# Patient Record
Sex: Female | Born: 1960
Health system: Southern US, Community
[De-identification: ages and names within clinical notes are randomized; demographics above are authoritative.]

## PROBLEM LIST (undated history)

## (undated) DIAGNOSIS — F32A Depression, unspecified: Secondary | ICD-10-CM

## (undated) DIAGNOSIS — I214 Non-ST elevation (NSTEMI) myocardial infarction: Secondary | ICD-10-CM

## (undated) DIAGNOSIS — I2510 Atherosclerotic heart disease of native coronary artery without angina pectoris: Secondary | ICD-10-CM

## (undated) DIAGNOSIS — E785 Hyperlipidemia, unspecified: Secondary | ICD-10-CM

## (undated) DIAGNOSIS — I10 Essential (primary) hypertension: Secondary | ICD-10-CM

## (undated) DIAGNOSIS — G43909 Migraine, unspecified, not intractable, without status migrainosus: Secondary | ICD-10-CM

## (undated) DIAGNOSIS — F329 Major depressive disorder, single episode, unspecified: Secondary | ICD-10-CM

## (undated) DIAGNOSIS — Z87442 Personal history of urinary calculi: Secondary | ICD-10-CM

## (undated) HISTORY — PX: SKIN SURGERY: SHX2413

## (undated) HISTORY — PX: LITHOTRIPSY: SUR834

## (undated) HISTORY — DX: Hyperlipidemia, unspecified: E78.5

## (undated) HISTORY — PX: CYSTOSCOPY W/ STONE MANIPULATION: SHX1427

---
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
## 1998-01-25 ENCOUNTER — Emergency Department (HOSPITAL_COMMUNITY): Admission: EM | Admit: 1998-01-25 | Discharge: 1998-01-25 | Payer: Self-pay | Admitting: Emergency Medicine

## 1998-08-23 ENCOUNTER — Emergency Department (HOSPITAL_COMMUNITY): Admission: EM | Admit: 1998-08-23 | Discharge: 1998-08-23 | Payer: Self-pay | Admitting: Emergency Medicine

## 1998-10-16 ENCOUNTER — Ambulatory Visit (HOSPITAL_COMMUNITY): Admission: RE | Admit: 1998-10-16 | Discharge: 1998-10-16 | Payer: Self-pay | Admitting: Gastroenterology

## 2000-04-01 ENCOUNTER — Ambulatory Visit (HOSPITAL_COMMUNITY): Admission: RE | Admit: 2000-04-01 | Discharge: 2000-04-01 | Payer: Self-pay | Admitting: Internal Medicine

## 2000-04-01 ENCOUNTER — Encounter: Payer: Self-pay | Admitting: Internal Medicine

## 2000-06-24 ENCOUNTER — Ambulatory Visit (HOSPITAL_COMMUNITY): Admission: RE | Admit: 2000-06-24 | Discharge: 2000-06-24 | Payer: Self-pay | Admitting: Gastroenterology

## 2000-06-24 ENCOUNTER — Encounter (INDEPENDENT_AMBULATORY_CARE_PROVIDER_SITE_OTHER): Payer: Self-pay | Admitting: Specialist

## 2000-06-30 ENCOUNTER — Ambulatory Visit (HOSPITAL_COMMUNITY): Admission: RE | Admit: 2000-06-30 | Discharge: 2000-06-30 | Payer: Self-pay | Admitting: Gastroenterology

## 2000-06-30 ENCOUNTER — Encounter: Payer: Self-pay | Admitting: Gastroenterology

## 2001-04-03 ENCOUNTER — Encounter: Admission: RE | Admit: 2001-04-03 | Discharge: 2001-04-03 | Payer: Self-pay | Admitting: Internal Medicine

## 2001-04-03 ENCOUNTER — Encounter: Payer: Self-pay | Admitting: Internal Medicine

## 2001-04-20 ENCOUNTER — Encounter: Admission: RE | Admit: 2001-04-20 | Discharge: 2001-04-20 | Payer: Self-pay | Admitting: Internal Medicine

## 2001-04-20 ENCOUNTER — Encounter: Payer: Self-pay | Admitting: Internal Medicine

## 2001-05-03 ENCOUNTER — Encounter (INDEPENDENT_AMBULATORY_CARE_PROVIDER_SITE_OTHER): Payer: Self-pay | Admitting: *Deleted

## 2001-05-03 ENCOUNTER — Ambulatory Visit (HOSPITAL_COMMUNITY): Admission: RE | Admit: 2001-05-03 | Discharge: 2001-05-03 | Payer: Self-pay | Admitting: Internal Medicine

## 2001-05-03 ENCOUNTER — Encounter: Payer: Self-pay | Admitting: Internal Medicine

## 2001-08-14 ENCOUNTER — Encounter: Payer: Self-pay | Admitting: Emergency Medicine

## 2001-08-14 ENCOUNTER — Emergency Department (HOSPITAL_COMMUNITY): Admission: EM | Admit: 2001-08-14 | Discharge: 2001-08-14 | Payer: Self-pay | Admitting: Emergency Medicine

## 2001-11-16 ENCOUNTER — Encounter: Admission: RE | Admit: 2001-11-16 | Discharge: 2001-11-16 | Payer: Self-pay | Admitting: Internal Medicine

## 2001-11-16 ENCOUNTER — Encounter: Payer: Self-pay | Admitting: Internal Medicine

## 2002-07-26 ENCOUNTER — Ambulatory Visit (HOSPITAL_COMMUNITY): Admission: RE | Admit: 2002-07-26 | Discharge: 2002-07-26 | Payer: Self-pay | Admitting: Internal Medicine

## 2002-07-26 ENCOUNTER — Encounter: Payer: Self-pay | Admitting: Internal Medicine

## 2002-08-03 ENCOUNTER — Encounter: Payer: Self-pay | Admitting: Internal Medicine

## 2002-08-03 ENCOUNTER — Encounter: Admission: RE | Admit: 2002-08-03 | Discharge: 2002-08-03 | Payer: Self-pay | Admitting: Internal Medicine

## 2003-08-07 ENCOUNTER — Encounter: Admission: RE | Admit: 2003-08-07 | Discharge: 2003-08-07 | Payer: Self-pay | Admitting: Internal Medicine

## 2003-09-20 ENCOUNTER — Encounter: Admission: RE | Admit: 2003-09-20 | Discharge: 2003-09-20 | Payer: Self-pay | Admitting: Internal Medicine

## 2003-10-22 ENCOUNTER — Other Ambulatory Visit: Admission: RE | Admit: 2003-10-22 | Discharge: 2003-10-22 | Payer: Self-pay | Admitting: Internal Medicine

## 2003-10-23 ENCOUNTER — Encounter: Admission: RE | Admit: 2003-10-23 | Discharge: 2003-10-23 | Payer: Self-pay | Admitting: Internal Medicine

## 2003-11-05 ENCOUNTER — Ambulatory Visit (HOSPITAL_COMMUNITY): Admission: RE | Admit: 2003-11-05 | Discharge: 2003-11-05 | Payer: Self-pay | Admitting: Internal Medicine

## 2003-11-05 ENCOUNTER — Encounter (INDEPENDENT_AMBULATORY_CARE_PROVIDER_SITE_OTHER): Payer: Self-pay | Admitting: Specialist

## 2004-03-27 ENCOUNTER — Encounter: Admission: RE | Admit: 2004-03-27 | Discharge: 2004-03-27 | Payer: Self-pay | Admitting: Family Medicine

## 2004-09-21 ENCOUNTER — Encounter: Admission: RE | Admit: 2004-09-21 | Discharge: 2004-09-21 | Payer: Self-pay | Admitting: Internal Medicine

## 2004-11-18 ENCOUNTER — Other Ambulatory Visit: Admission: RE | Admit: 2004-11-18 | Discharge: 2004-11-18 | Payer: Self-pay | Admitting: Internal Medicine

## 2005-05-18 ENCOUNTER — Emergency Department (HOSPITAL_COMMUNITY): Admission: EM | Admit: 2005-05-18 | Discharge: 2005-05-18 | Payer: Self-pay | Admitting: Emergency Medicine

## 2005-09-22 ENCOUNTER — Encounter: Admission: RE | Admit: 2005-09-22 | Discharge: 2005-09-22 | Payer: Self-pay | Admitting: Internal Medicine

## 2006-08-01 ENCOUNTER — Inpatient Hospital Stay (HOSPITAL_COMMUNITY): Admission: EM | Admit: 2006-08-01 | Discharge: 2006-08-02 | Payer: Self-pay | Admitting: Emergency Medicine

## 2006-09-30 ENCOUNTER — Encounter: Admission: RE | Admit: 2006-09-30 | Discharge: 2006-09-30 | Payer: Self-pay | Admitting: *Deleted

## 2007-09-18 ENCOUNTER — Encounter: Admission: RE | Admit: 2007-09-18 | Discharge: 2007-09-18 | Payer: Self-pay | Admitting: Family Medicine

## 2007-09-29 ENCOUNTER — Encounter: Admission: RE | Admit: 2007-09-29 | Discharge: 2007-09-29 | Payer: Self-pay | Admitting: Family Medicine

## 2007-09-29 ENCOUNTER — Encounter (INDEPENDENT_AMBULATORY_CARE_PROVIDER_SITE_OTHER): Payer: Self-pay | Admitting: Interventional Radiology

## 2007-09-29 ENCOUNTER — Other Ambulatory Visit: Admission: RE | Admit: 2007-09-29 | Discharge: 2007-09-29 | Payer: Self-pay | Admitting: Interventional Radiology

## 2007-10-19 ENCOUNTER — Encounter: Admission: RE | Admit: 2007-10-19 | Discharge: 2007-10-19 | Payer: Self-pay | Admitting: Family Medicine

## 2007-11-02 ENCOUNTER — Encounter: Admission: RE | Admit: 2007-11-02 | Discharge: 2007-11-02 | Payer: Self-pay | Admitting: Family Medicine

## 2008-01-08 ENCOUNTER — Other Ambulatory Visit: Admission: RE | Admit: 2008-01-08 | Discharge: 2008-01-08 | Payer: Self-pay | Admitting: Pediatrics

## 2009-12-17 ENCOUNTER — Encounter: Admission: RE | Admit: 2009-12-17 | Discharge: 2009-12-17 | Payer: Self-pay | Admitting: *Deleted

## 2009-12-22 ENCOUNTER — Encounter: Admission: RE | Admit: 2009-12-22 | Discharge: 2009-12-22 | Payer: Self-pay | Admitting: *Deleted

## 2010-01-16 ENCOUNTER — Ambulatory Visit (HOSPITAL_COMMUNITY): Admission: RE | Admit: 2010-01-16 | Discharge: 2010-01-16 | Payer: Self-pay | Admitting: Surgery

## 2010-06-21 ENCOUNTER — Encounter: Payer: Self-pay | Admitting: Family Medicine

## 2010-08-13 LAB — DIFFERENTIAL
Basophils Relative: 2 % — ABNORMAL HIGH (ref 0–1)
Eosinophils Absolute: 0.1 10*3/uL (ref 0.0–0.7)
Eosinophils Relative: 2 % (ref 0–5)
Lymphs Abs: 1.1 10*3/uL (ref 0.7–4.0)
Monocytes Absolute: 0.4 10*3/uL (ref 0.1–1.0)
Monocytes Relative: 10 % (ref 3–12)
Neutrophils Relative %: 60 % (ref 43–77)

## 2010-08-13 LAB — CBC
Hemoglobin: 14.8 g/dL (ref 12.0–15.0)
MCH: 33.3 pg (ref 26.0–34.0)
MCHC: 34.4 g/dL (ref 30.0–36.0)

## 2010-08-13 LAB — BASIC METABOLIC PANEL
Calcium: 9.7 mg/dL (ref 8.4–10.5)
Chloride: 102 mEq/L (ref 96–112)
Creatinine, Ser: 0.62 mg/dL (ref 0.4–1.2)
GFR calc Af Amer: >60 mL/min (ref >60)
Potassium: 4.1 mEq/L (ref 3.5–5.1)

## 2010-10-16 NOTE — H&P (Signed)
NAME:  Monique Hunt, Monique Hunt             ACCOUNT NO.:  [id number]   MEDICAL RECORD NO.:  [id number]          PATIENT TYPE:  INP   LOCATION:  6713                         FACILITY:  [facility]   PHYSICIAN:  Kela Millin, M.D.DATE OF BIRTH:  1960-07-07   DATE OF ADMISSION:  08/01/2006  DATE OF DISCHARGE:                              HISTORY & PHYSICAL   CHIEF COMPLAINT:  Chest pain.   HISTORY OF PRESENT ILLNESS:  The patient is a 50 year old white female  with a past medical history significant for GERD, hypothyroidism who  presents with complaints of chest pain x2 days.  She reports that she  was awakened by the pain on Sunday morning, and she describes the pain  as constant, 9 out of 10 in intensity, associated with nausea but no  vomiting, also associated with shortness of breath and a pressure  sensation.  She did state initially it was located in her mid sternal  area and also her left precordium but the left precordial component  resolved Sunday and the mid sternal pain persisted.  She denies any  radiation, diaphoresis, not associated with meals or with exertion.  Patient denies cough, fevers, abdominal pain, dysuria, melena and no  hematochezia.  No reports of any trauma or lifting any heavy weights.  It is noted that patient had presented in 2006 with similar complaints  and was seen in the ER, discharged home and upon followup had an EGD  done and was diagnosed with GERD at that time and placed on medication  for reflux, which she does not remember the name, as well as maintained  on the Nexium.   Patient was seen in the ER and point of care markers were negative.  She  had a chest x-ray done which was negative for acute infiltrates, EKG  normal sinus rhythm with no ischemic changes noted.  She is admitted to  the [facility] for further evaluation and management.   PAST MEDICAL HISTORY:  As stated above.   MEDICATIONS:  1. Nexium one p.o. daily.  2. Synthroid  0.25 mg daily.  3. Neurontin 200 mg p.o. t.i.d.  4. A pill for reflux - does not remember name.   ALLERGIES:  NKDA.   SOCIAL HISTORY:  She denies tobacco, occasional alcohol.   FAMILY HISTORY:  Her father had an MI/coronary artery disease and had  bypass done at age 2.  Her Mom diabetic.   REVIEW OF SYSTEMS:  As per HPI, other review of systems negative.   PHYSICAL EXAM:  GENERAL:  The patient is a pleasant middle-aged white  female, she is alert and oriented, in no apparent distress.  VITAL SIGNS:  Her temperature is 98.6 with a blood pressure of 110/60,  pulse of 84, respiratory rate of 20, O2 sat of 100%.  HEENT:  PERRL, EOMI, sclera anicteric, moist mucous membranes and no  oral exudates.  NECK:  Supple, no adenopathy, no thyromegaly.  LUNGS/CHEST:  She has chest wall tenderness on exam, lungs are clear to  auscultation bilaterally, no crackles or wheezes.  CARDIOVASCULAR:  Regular rate and rhythm, normal S1, S2.  No murmurs  appreciated, no gallops.  ABDOMEN:  Soft, bowel sounds present, nontender, nondistended, no  organomegaly and no masses palpable.  EXTREMITIES:  No cyanosis and no edema.  NEURO:  She is alert, oriented, cranial nerves II-XII grossly intact,  nonfocal exam.   LABORATORY DATA:  The chest x-ray shows borderline heart size, otherwise  negative for acute infiltrates.  Her point of care markers are negative  and sodium is 138, potassium of 3.4, chloride 108, BUN is 14, glucose is  98, pH is 7.42, pCO2 of 34.1 and bicarbonate is 22.1.  Her hemoglobin is  13.9 with a hematocrit of 41, creatinine is 0.8.  Her point of care  markers are negative x2.   ASSESSMENT AND PLAN:  1. Chest pain - cardiac versus musculoskeletal versus gastroesophageal      reflux disease.  As noted above, patient with chest wall tenderness      on exam.  Will obtain serial cardiac enzymes to rule out myocardial      infarction.  Continue proton pump inhibitor.  Add Toradol as       needed.  Place patient on aspirin and nitrates and follow.  Will      follow cardiac enzymes and consider inpatient versus outpatient      stress test pending enzymes per Cardiology.  2. Gastroesophageal reflux disease - continue proton pump inhibitor,      as above.  3. Hypothyroidism - recheck TSH, follow and resume Synthroid.      Kela Millin, M.D.  Electronically Signed     ACV/MEDQ  D:  08/02/2006  T:  08/02/2006  Job:  [id number]   cc:   Lucita Ferrara, MD  Corky Crafts, MD

## 2010-10-16 NOTE — Consult Note (Signed)
NAME:  Monique Hunt, Monique Hunt NO.:  [id number]   MEDICAL RECORD NO.:  [id number]          PATIENT TYPE:  EMS   LOCATION:  MAJO                         FACILITY:  [facility]   PHYSICIAN:  Thora Lance, M.D.  DATE OF BIRTH:  07/01/1960   DATE OF CONSULTATION:  05/18/2005  DATE OF DISCHARGE:  05/18/2005                                   CONSULTATION   CHIEF COMPLAINT:  Chest pain.   HISTORY OF PRESENT ILLNESS:  This is a 50 year old female with a history of  hypothyroidism and GERD, who presents with chest pain.  She woke up  yesterday morning with pain in her lower central chest and upper  epigastrium.  The pain was severe at times.  It has been constant over the  last 36 hours.  It is described as a burning or sharp-type pain.  There have  been no obvious exacerbating or relieving factors such as eating, drinking  or motion.  She has had no true dyspnea, nausea, vomiting or diaphoresis.  The patient went through the day yesterday with the pain.  When she woke up  and the pain was still present, she decided to come to the emergency room.  The patient had an EGD two weeks ago by Dr. Sherin Quarry, which apparently was  okay.  She has a history of gastroesophageal reflux and is currently on  Nexium, taking it regularly.  Her symptoms of reflux have included heartburn  and eructation during the past but not this type of chest pain.  In the ER  she received Dilaudid and within one hour her pain had resolved and has not  recurred.   PAST MEDICAL HISTORY:  1.  Hypothyroidism, on Synthroid.  2.  GERD, on Nexium.   PAST SURGICAL HISTORY:  Kidney stone surgery.   ALLERGIES:  No known drug allergies.   CURRENT MEDICATIONS:  1.  Synthroid, question dose.  2.  Nexium 40 mg a day.   FAMILY HISTORY:  Her father had CABG, early 33s.  Mother, diabetes mellitus.  Brother, elevated cholesterol.   SOCIAL HISTORY:  Married.  Two children.  She works at a Artist.  Tobacco use:   None.  Alcohol:  Very little.   REVIEW OF SYSTEMS:  Cholesterol borderline elevated in the past.   PHYSICAL EXAMINATION:  GENERAL:  A well-appearing white female.  VITAL SIGNS:  BP initially 185/83, heart rate 74, respirations 20,  temperature 97.4.  Blood pressure dropped to 139/80 with improvement in  pain.  NECK:  Supple, no carotid bruits.  LUNGS:  Clear.  CARDIAC:  Regular rate and rhythm without murmur, gallop, or rub.  CHEST WALL:  She has tenderness over the lower costochondral junctions  bilaterally.  ABDOMEN:  Soft, nontender, no mass or hepatosplenomegaly.  EXTREMITIES:  No edema.   LABORATORY DATA:  CBC:  WBC 4.8, hemoglobin 13.1, platelet count 381.  Chemistry:  Sodium 140, potassium 4.1, chloride 104, bicarbonate 28, glucose  100, BUN 10, creatinine 0.6, calcium 9.2.  Total protein 6.5, albumin 3.9,  SGOT 27, SGPT 28, alkaline phosphatase 52, total bilirubin 0.5.  Lipase 35.  Cardiac markers are all negative.  Fibrin derivatives 0.30 (less than 0.48  normal).  A chest x-ray shows borderline cardiomegaly, no acute disease.  EKG:  Normal sinus rhythm, there are nonspecific T-wave changes.  Right  upper quadrant ultrasound is negative for gallstones.   ASSESSMENT:  Chest pain.  Lasted for approximately 36 hours before resolving  after narcotics given.  The most likely causes are either gastroesophageal  reflux or costochondritis.  Doubt this is cardiac given her young age, lack  of cardiac risk factors, lack of EKG findings and normal cardiac markers.   PLAN:  1.  Increase Nexium to 40 mg b.i.d. temporarily.  2.  Follow up with Marcene Duos, M.D., her primary physician, in      the next three days for consideration of further workup.           ______________________________  Thora Lance, M.D.     JJG/MEDQ  D:  05/18/2005  T:  05/20/2005  Job:  [id number]   cc:   Marcene Duos, M.D.  Fax: [phone]

## 2010-11-13 ENCOUNTER — Other Ambulatory Visit: Payer: Self-pay | Admitting: Family Medicine

## 2010-11-13 DIAGNOSIS — M2550 Pain in unspecified joint: Secondary | ICD-10-CM

## 2010-12-01 ENCOUNTER — Ambulatory Visit
Admission: RE | Admit: 2010-12-01 | Discharge: 2010-12-01 | Disposition: A | Discharge status: Home / Self Care | Payer: PRIVATE HEALTH INSURANCE | Source: Ambulatory Visit | Attending: Family Medicine | Admitting: Family Medicine

## 2010-12-01 DIAGNOSIS — M2550 Pain in unspecified joint: Secondary | ICD-10-CM

## 2010-12-27 ENCOUNTER — Emergency Department (HOSPITAL_COMMUNITY)
Admission: EM | Admit: 2010-12-27 | Discharge: 2010-12-27 | Disposition: A | Discharge status: Home / Self Care | Payer: PRIVATE HEALTH INSURANCE | Attending: Emergency Medicine | Admitting: Emergency Medicine

## 2010-12-27 DIAGNOSIS — N200 Calculus of kidney: Secondary | ICD-10-CM | POA: Insufficient documentation

## 2010-12-27 DIAGNOSIS — R109 Unspecified abdominal pain: Secondary | ICD-10-CM | POA: Insufficient documentation

## 2010-12-27 LAB — URINALYSIS, ROUTINE W REFLEX MICROSCOPIC
Bilirubin Urine: NEGATIVE
Glucose, UA: NEGATIVE mg/dL
Nitrite: NEGATIVE
Protein, ur: 100 mg/dL — AB
Urobilinogen, UA: 1 mg/dL (ref 0.0–1.0)
pH: 6 (ref 5.0–8.0)

## 2010-12-27 LAB — URINE MICROSCOPIC-ADD ON

## 2010-12-27 LAB — POCT PREGNANCY, URINE: Preg Test, Ur: NEGATIVE

## 2011-03-22 ENCOUNTER — Other Ambulatory Visit: Payer: Self-pay | Admitting: Family Medicine

## 2011-03-22 DIAGNOSIS — Z1231 Encounter for screening mammogram for malignant neoplasm of breast: Secondary | ICD-10-CM

## 2011-03-22 DIAGNOSIS — N6452 Nipple discharge: Secondary | ICD-10-CM

## 2011-04-01 ENCOUNTER — Other Ambulatory Visit: Payer: Self-pay | Admitting: Nurse Practitioner

## 2011-04-01 ENCOUNTER — Other Ambulatory Visit (HOSPITAL_COMMUNITY)
Admission: RE | Admit: 2011-04-01 | Discharge: 2011-04-01 | Disposition: A | Discharge status: Home / Self Care | Payer: PRIVATE HEALTH INSURANCE | Source: Ambulatory Visit | Attending: Obstetrics and Gynecology | Admitting: Obstetrics and Gynecology

## 2011-04-01 DIAGNOSIS — Z1159 Encounter for screening for other viral diseases: Secondary | ICD-10-CM | POA: Insufficient documentation

## 2011-04-01 DIAGNOSIS — Z113 Encounter for screening for infections with a predominantly sexual mode of transmission: Secondary | ICD-10-CM | POA: Insufficient documentation

## 2011-04-01 DIAGNOSIS — Z01419 Encounter for gynecological examination (general) (routine) without abnormal findings: Secondary | ICD-10-CM | POA: Insufficient documentation

## 2011-04-07 ENCOUNTER — Encounter: Payer: Self-pay | Admitting: *Deleted

## 2011-04-07 ENCOUNTER — Emergency Department (INDEPENDENT_AMBULATORY_CARE_PROVIDER_SITE_OTHER): Payer: PRIVATE HEALTH INSURANCE

## 2011-04-07 ENCOUNTER — Other Ambulatory Visit: Payer: Self-pay

## 2011-04-07 ENCOUNTER — Emergency Department (HOSPITAL_BASED_OUTPATIENT_CLINIC_OR_DEPARTMENT_OTHER)
Admission: EM | Admit: 2011-04-07 | Discharge: 2011-04-07 | Disposition: A | Discharge status: Home / Self Care | Payer: PRIVATE HEALTH INSURANCE | Attending: Emergency Medicine | Admitting: Emergency Medicine

## 2011-04-07 DIAGNOSIS — R002 Palpitations: Secondary | ICD-10-CM

## 2011-04-07 DIAGNOSIS — R0602 Shortness of breath: Secondary | ICD-10-CM

## 2011-04-07 LAB — DIFFERENTIAL
Basophils Relative: 1 % (ref 0–1)
Eosinophils Absolute: 0 10*3/uL (ref 0.0–0.7)
Eosinophils Relative: 0 % (ref 0–5)
Lymphs Abs: 0.9 10*3/uL (ref 0.7–4.0)
Monocytes Relative: 6 % (ref 3–12)

## 2011-04-07 LAB — BASIC METABOLIC PANEL
BUN: 16 mg/dL (ref 6–23)
Calcium: 9.9 mg/dL (ref 8.4–10.5)
GFR calc non Af Amer: >90 mL/min (ref >90)
Glucose, Bld: 106 mg/dL — ABNORMAL HIGH (ref 70–99)

## 2011-04-07 LAB — CBC
Hemoglobin: 14.3 g/dL (ref 12.0–15.0)
MCH: 31.6 pg (ref 26.0–34.0)
MCHC: 34.4 g/dL (ref 30.0–36.0)
MCV: 91.8 fL (ref 78.0–100.0)
Platelets: 350 10*3/uL (ref 150–400)
RBC: 4.53 MIL/uL (ref 3.87–5.11)

## 2011-04-07 MED ORDER — POTASSIUM CHLORIDE CRYS ER 20 MEQ PO TBCR
EXTENDED_RELEASE_TABLET | ORAL | Status: AC
  Administered 2011-04-07: 20 meq
  Filled 2011-04-07: qty 1

## 2011-04-07 MED ORDER — POTASSIUM CHLORIDE 20 MEQ PO PACK
20.0000 meq | PACK | Freq: Two times a day (BID) | ORAL | Status: DC
  Administered 2011-04-07: 20 meq via ORAL
  Filled 2011-04-07: qty 1

## 2011-04-07 MED ORDER — LORAZEPAM 2 MG/ML IJ SOLN
1.0000 mg | Freq: Once | INTRAMUSCULAR | Status: DC
  Filled 2011-04-07: qty 1

## 2011-04-07 NOTE — ED Notes (Signed)
Patient states she was driving and had a sudden onset of shortness of breath, feeling of being hot and a rapid heart rate.  No history of same.

## 2011-04-07 NOTE — ED Notes (Signed)
Patient stated she has been SOB since this AM, BBS clear. SAT 100% on RA. EKG done. RT will continue to monitor.

## 2011-04-07 NOTE — ED Provider Notes (Signed)
History     CSN: [id number] Arrival date & time: 04/07/2011  9:32 AM   First MD Initiated Contact with Patient 04/07/11 [phone]      Chief Complaint  Patient presents with  . Shortness of Breath    rapid heart rate    (Consider location/radiation/quality/duration/timing/severity/associated sxs/prior treatment) HPI The patient states she was driving to work when she suddenly felt short of breath, hot, and felt her heart racing. Patient denies any pain.  The symptoms started shortly prior to arrival today. They have subsequently almost resolved. Patient states she feels much better now. The patient denies any anxiety or chest pain. She denies any leg swelling. She has not had any recent trips or travel. Patient has not noticed any swelling in her legs. She is taking a new medication as listed in her home medications. The severity was severe. Nothing seemed to make it better or worse specifically it just resolved on its own. Past Medical History  Diagnosis Date  . Kidney stone     History reviewed. No pertinent past surgical history.  No family history on file.  History  Substance Use Topics  . Smoking status: Former Games developer  . Smokeless tobacco: Not on file  . Alcohol Use: Yes     occassionally    OB History    Grav Para Term Preterm Abortions TAB SAB Ect Mult Living                  Review of Systems  All other systems reviewed and are negative.    Allergies  Review of patient's allergies indicates not on file.  Home Medications   Current Outpatient Rx  Name Route Sig Dispense Refill  . NORETHIN ACE-ETH ESTRAD-FE 1-20 MG-MCG PO TABS Oral Take 1 tablet by mouth daily.        BP 177/99  Pulse 94  Temp(Src) 97.6 F (36.4 C) (Oral)  Ht 5\' 5"  (1.651 m)  Wt 135 lb (61.236 kg)  BMI 22.47 kg/m2  SpO2 100%  LMP 02/28/2011  Physical Exam  Nursing note and vitals reviewed. Constitutional: She appears well-developed and well-nourished. No distress.       Mildly  anxious  HENT:  Head: Normocephalic and atraumatic.  Right Ear: External ear normal.  Left Ear: External ear normal.  Eyes: Conjunctivae are normal. Right eye exhibits no discharge. Left eye exhibits no discharge. No scleral icterus.  Neck: Neck supple. No JVD present. No tracheal deviation present. No thyromegaly present.  Cardiovascular: Normal rate, regular rhythm and intact distal pulses.   Pulmonary/Chest: Effort normal and breath sounds normal. No stridor. No respiratory distress. She has no wheezes. She has no rales.       Hyperventilating slightly  Abdominal: Soft. Bowel sounds are normal. She exhibits no distension. There is no tenderness. There is no rebound and no guarding.  Musculoskeletal: She exhibits no edema and no tenderness.  Lymphadenopathy:    She has no cervical adenopathy.  Neurological: She is alert. She has normal strength. No sensory deficit. Cranial nerve deficit:  no gross defecits noted. She exhibits normal muscle tone. She displays no seizure activity. Coordination normal.  Skin: Skin is warm and dry. No rash noted.  Psychiatric: She has a normal mood and affect.    ED Course  Procedures (including critical care time)  Date: 04/07/2011  Rate: 87  Rhythm: normal sinus rhythm  QRS Axis: normal  Intervals: normal  ST/T Wave abnormalities: LVH  Conduction Disutrbances:none  Narrative Interpretation:  Old EKG Reviewed: none available   Labs Reviewed  DIFFERENTIAL - Abnormal; Notable for the following:    Neutrophils Relative 82 (*)    Lymphocytes Relative 11 (*)    All other components within normal limits  BASIC METABOLIC PANEL - Abnormal; Notable for the following:    Potassium 3.2 (*)    Glucose, Bld 106 (*)    All other components within normal limits  CBC  PRO B NATRIURETIC PEPTIDE  TROPONIN I  D-DIMER, QUANTITATIVE   Dg Chest 2 View  04/07/2011  *RADIOLOGY REPORT*  Clinical Data: Shortness of breath.  CHEST - 2 VIEW  Comparison: 08/01/2006   Findings: Heart and mediastinal contours are within normal limits. No focal opacities or effusions.  No acute bony abnormality. Thoracolumbar scoliosis with degenerative changes.  IMPRESSION: No active cardiopulmonary disease.  Original Report Authenticated By: Cyndie Chime, M.D.        MDM  The patient had acute abnormalities noted on her lab and x-ray testing. Her symptoms have resolved at this point. There is no symptoms to suggest pulmonary embolus , pneumothorax, pneumonia. I doubt acute coronary syndrome. Patient could have had a panic attack or as well she might have an episode of a tachycardia dysrhythmia (less likely) the patient followup with her primary care doctor. Consider outpatient Holter monitoring.  Diagnosis: Palpitations      Celene Kras, MD 04/07/11 1301

## 2011-04-07 NOTE — ED Notes (Signed)
Pt refused meds.

## 2011-04-08 ENCOUNTER — Ambulatory Visit: Payer: PRIVATE HEALTH INSURANCE

## 2011-04-09 ENCOUNTER — Other Ambulatory Visit: Payer: Self-pay | Admitting: Family Medicine

## 2011-04-09 ENCOUNTER — Ambulatory Visit
Admission: RE | Admit: 2011-04-09 | Discharge: 2011-04-09 | Disposition: A | Discharge status: Home / Self Care | Payer: PRIVATE HEALTH INSURANCE | Source: Ambulatory Visit | Attending: Family Medicine | Admitting: Family Medicine

## 2011-04-09 DIAGNOSIS — N6452 Nipple discharge: Secondary | ICD-10-CM

## 2011-09-26 ENCOUNTER — Encounter (HOSPITAL_COMMUNITY): Payer: Self-pay | Admitting: Emergency Medicine

## 2011-09-26 ENCOUNTER — Inpatient Hospital Stay (HOSPITAL_COMMUNITY)
Admission: EM | Admit: 2011-09-26 | Discharge: 2011-09-29 | DRG: 690 | Disposition: A | Discharge status: Home / Self Care | Payer: PRIVATE HEALTH INSURANCE | Attending: Internal Medicine | Admitting: Internal Medicine

## 2011-09-26 DIAGNOSIS — N10 Acute tubulo-interstitial nephritis: Principal | ICD-10-CM | POA: Diagnosis present

## 2011-09-26 DIAGNOSIS — N200 Calculus of kidney: Secondary | ICD-10-CM | POA: Diagnosis present

## 2011-09-26 DIAGNOSIS — E876 Hypokalemia: Secondary | ICD-10-CM | POA: Diagnosis present

## 2011-09-26 DIAGNOSIS — E871 Hypo-osmolality and hyponatremia: Secondary | ICD-10-CM | POA: Diagnosis present

## 2011-09-26 MED ORDER — ONDANSETRON HCL 4 MG/2ML IJ SOLN
4.0000 mg | Freq: Once | INTRAMUSCULAR | Status: AC
  Administered 2011-09-27: 4 mg via INTRAVENOUS
  Filled 2011-09-26: qty 2

## 2011-09-26 MED ORDER — HYDROMORPHONE HCL PF 1 MG/ML IJ SOLN
1.0000 mg | Freq: Once | INTRAMUSCULAR | Status: AC
  Administered 2011-09-27: 1 mg via INTRAVENOUS
  Filled 2011-09-26: qty 1

## 2011-09-26 MED ORDER — SODIUM CHLORIDE 0.9 % IV SOLN
INTRAVENOUS | Status: DC
  Administered 2011-09-27: 999 mL via INTRAVENOUS
  Administered 2011-09-27: 02:00:00 via INTRAVENOUS

## 2011-09-26 NOTE — ED Notes (Signed)
Patient with right flank pain, which has moved from high to low.  Patient states this has been going on since last night.  Patient does have some nausea and vomiting.

## 2011-09-27 ENCOUNTER — Emergency Department (HOSPITAL_COMMUNITY): Payer: PRIVATE HEALTH INSURANCE

## 2011-09-27 ENCOUNTER — Encounter (HOSPITAL_COMMUNITY): Payer: Self-pay | Admitting: Radiology

## 2011-09-27 DIAGNOSIS — E876 Hypokalemia: Secondary | ICD-10-CM | POA: Diagnosis present

## 2011-09-27 DIAGNOSIS — N200 Calculus of kidney: Secondary | ICD-10-CM | POA: Diagnosis present

## 2011-09-27 DIAGNOSIS — N10 Acute tubulo-interstitial nephritis: Secondary | ICD-10-CM | POA: Diagnosis present

## 2011-09-27 DIAGNOSIS — E871 Hypo-osmolality and hyponatremia: Secondary | ICD-10-CM | POA: Diagnosis present

## 2011-09-27 DIAGNOSIS — R109 Unspecified abdominal pain: Secondary | ICD-10-CM

## 2011-09-27 LAB — CBC
HCT: 36.5 % (ref 36.0–46.0)
HCT: 34.8 % — ABNORMAL LOW (ref 36.0–46.0)
Hemoglobin: 12.4 g/dL (ref 12.0–15.0)
MCH: 32.4 pg (ref 26.0–34.0)
MCHC: 34 g/dL (ref 30.0–36.0)
MCV: 94.8 fL (ref 78.0–100.0)
MCV: 95.6 fL (ref 78.0–100.0)
RDW: 13.3 % (ref 11.5–15.5)
RDW: 13.5 % (ref 11.5–15.5)
WBC: 9.5 10*3/uL (ref 4.0–10.5)

## 2011-09-27 LAB — URINE MICROSCOPIC-ADD ON

## 2011-09-27 LAB — POCT I-STAT, CHEM 8
BUN: 9 mg/dL (ref 6–23)
Calcium, Ion: 1.14 mmol/L (ref 1.12–1.32)
Chloride: 102 mEq/L (ref 96–112)
Glucose, Bld: 202 mg/dL — ABNORMAL HIGH (ref 70–99)
Potassium: 2.8 mEq/L — ABNORMAL LOW (ref 3.5–5.1)

## 2011-09-27 LAB — URINALYSIS, ROUTINE W REFLEX MICROSCOPIC
Bilirubin Urine: NEGATIVE
Glucose, UA: 250 mg/dL — AB
Nitrite: POSITIVE — AB
Specific Gravity, Urine: 1.012 (ref 1.005–1.030)
pH: 6 (ref 5.0–8.0)

## 2011-09-27 LAB — COMPREHENSIVE METABOLIC PANEL
Albumin: 3.2 g/dL — ABNORMAL LOW (ref 3.5–5.2)
Alkaline Phosphatase: 78 U/L (ref 39–117)
BUN: 9 mg/dL (ref 6–23)
Chloride: 99 mEq/L (ref 96–112)
Creatinine, Ser: 0.62 mg/dL (ref 0.50–1.10)
GFR calc Af Amer: >90 mL/min (ref >90)
Glucose, Bld: 148 mg/dL — ABNORMAL HIGH (ref 70–99)
Total Bilirubin: 0.4 mg/dL (ref 0.3–1.2)

## 2011-09-27 LAB — CREATININE, SERUM: GFR calc Af Amer: >90 mL/min (ref >90)

## 2011-09-27 LAB — POTASSIUM: Potassium: 3.9 mEq/L (ref 3.5–5.1)

## 2011-09-27 LAB — LIPASE, BLOOD: Lipase: 23 U/L (ref 11–59)

## 2011-09-27 LAB — LACTIC ACID, PLASMA: Lactic Acid, Venous: 1.3 mmol/L (ref 0.5–2.2)

## 2011-09-27 MED ORDER — SUMATRIPTAN SUCCINATE 100 MG PO TABS
100.0000 mg | ORAL_TABLET | Freq: Two times a day (BID) | ORAL | Status: DC | PRN
  Administered 2011-09-27 – 2011-09-28 (×2): 100 mg via ORAL
  Filled 2011-09-27 (×4): qty 1

## 2011-09-27 MED ORDER — SODIUM CHLORIDE 0.9 % IV BOLUS (SEPSIS)
1000.0000 mL | Freq: Once | INTRAVENOUS | Status: AC
  Administered 2011-09-27: 1000 mL via INTRAVENOUS

## 2011-09-27 MED ORDER — METRONIDAZOLE IN NACL 5-0.79 MG/ML-% IV SOLN
500.0000 mg | Freq: Three times a day (TID) | INTRAVENOUS | Status: DC
  Administered 2011-09-27: 500 mg via INTRAVENOUS
  Filled 2011-09-27 (×3): qty 100

## 2011-09-27 MED ORDER — ONDANSETRON HCL 4 MG/2ML IJ SOLN
4.0000 mg | Freq: Four times a day (QID) | INTRAMUSCULAR | Status: DC | PRN
  Administered 2011-09-28 (×2): 4 mg via INTRAVENOUS
  Filled 2011-09-27 (×2): qty 2

## 2011-09-27 MED ORDER — ENOXAPARIN SODIUM 40 MG/0.4ML ~~LOC~~ SOLN
40.0000 mg | SUBCUTANEOUS | Status: DC
  Administered 2011-09-27 – 2011-09-28 (×2): 40 mg via SUBCUTANEOUS
  Filled 2011-09-27 (×3): qty 0.4

## 2011-09-27 MED ORDER — POTASSIUM CHLORIDE 10 MEQ/100ML IV SOLN
10.0000 meq | Freq: Once | INTRAVENOUS | Status: AC
  Administered 2011-09-27: 10 meq via INTRAVENOUS
  Filled 2011-09-27: qty 100

## 2011-09-27 MED ORDER — OLOPATADINE HCL 0.1 % OP SOLN
1.0000 [drp] | Freq: Two times a day (BID) | OPHTHALMIC | Status: DC
  Administered 2011-09-27: 1 [drp] via OPHTHALMIC
  Filled 2011-09-27: qty 5

## 2011-09-27 MED ORDER — SODIUM CHLORIDE 0.9 % IV SOLN
INTRAVENOUS | Status: DC
  Administered 2011-09-27 – 2011-09-28 (×3): via INTRAVENOUS
  Filled 2011-09-27 (×6): qty 1000

## 2011-09-27 MED ORDER — MORPHINE SULFATE 2 MG/ML IJ SOLN
2.0000 mg | INTRAMUSCULAR | Status: DC | PRN
Start: 2011-09-27 — End: 2011-09-29
  Administered 2011-09-27 (×3): 2 mg via INTRAVENOUS
  Filled 2011-09-27 (×3): qty 1

## 2011-09-27 MED ORDER — ACETAMINOPHEN 325 MG PO TABS
650.0000 mg | ORAL_TABLET | Freq: Four times a day (QID) | ORAL | Status: DC | PRN
  Administered 2011-09-27: 650 mg via ORAL
  Filled 2011-09-27: qty 2

## 2011-09-27 MED ORDER — PROMETHAZINE HCL 25 MG/ML IJ SOLN
25.0000 mg | Freq: Once | INTRAMUSCULAR | Status: AC
  Administered 2011-09-27: 25 mg via INTRAVENOUS
  Filled 2011-09-27: qty 1

## 2011-09-27 MED ORDER — CIPROFLOXACIN IN D5W 400 MG/200ML IV SOLN
400.0000 mg | Freq: Two times a day (BID) | INTRAVENOUS | Status: DC
  Administered 2011-09-27 – 2011-09-29 (×5): 400 mg via INTRAVENOUS
  Filled 2011-09-27 (×6): qty 200

## 2011-09-27 MED ORDER — HYDROMORPHONE HCL PF 1 MG/ML IJ SOLN
1.0000 mg | INTRAMUSCULAR | Status: DC | PRN
  Administered 2011-09-28 (×2): 1 mg via INTRAVENOUS
  Filled 2011-09-27 (×2): qty 1

## 2011-09-27 MED ORDER — DEXTROSE 5 % IV SOLN: 1.0000 g | INTRAVENOUS | Status: DC

## 2011-09-27 MED ORDER — MAGNESIUM SULFATE 40 MG/ML IJ SOLN
2.0000 g | Freq: Once | INTRAMUSCULAR | Status: AC
  Administered 2011-09-27: 2 g via INTRAVENOUS
  Filled 2011-09-27: qty 50

## 2011-09-27 MED ORDER — NORETHIN ACE-ETH ESTRAD-FE 1-20 MG-MCG PO TABS: 1.0000 | ORAL_TABLET | Freq: Every day | ORAL | Status: DC

## 2011-09-27 MED ORDER — ACETAMINOPHEN 650 MG RE SUPP: 650.0000 mg | Freq: Four times a day (QID) | RECTAL | Status: DC | PRN

## 2011-09-27 MED ORDER — POTASSIUM CHLORIDE CRYS ER 20 MEQ PO TBCR
40.0000 meq | EXTENDED_RELEASE_TABLET | Freq: Two times a day (BID) | ORAL | Status: AC
  Administered 2011-09-27 (×2): 40 meq via ORAL
  Filled 2011-09-27 (×2): qty 2

## 2011-09-27 MED ORDER — HYDROCODONE-ACETAMINOPHEN 5-325 MG PO TABS
1.0000 | ORAL_TABLET | ORAL | Status: DC | PRN
  Filled 2011-09-27: qty 2

## 2011-09-27 MED ORDER — SODIUM CHLORIDE 0.9 % IV SOLN: INTRAVENOUS | Status: DC

## 2011-09-27 MED ORDER — DEXTROSE 5 % IV SOLN
1.0000 g | Freq: Once | INTRAVENOUS | Status: AC
  Administered 2011-09-27: 1 g via INTRAVENOUS
  Filled 2011-09-27: qty 10

## 2011-09-27 MED ORDER — ONDANSETRON HCL 4 MG PO TABS: 4.0000 mg | ORAL_TABLET | Freq: Four times a day (QID) | ORAL | Status: DC | PRN

## 2011-09-27 NOTE — Progress Notes (Signed)
Utilization review completed. Gayathri Shakeia Krus RN  

## 2011-09-27 NOTE — ED Notes (Signed)
Pt moved to room 25 for comfort, decreased temp of room and decrease in construction noise.

## 2011-09-27 NOTE — ED Notes (Signed)
Dr. Opitz at bedside. 

## 2011-09-27 NOTE — ED Notes (Signed)
Dr. Dierdre Highman made aware of pt Potassium level. Will continue to monitor.

## 2011-09-27 NOTE — Consult Note (Signed)
Monique Hunt 1960-06-27  [id number].   Requesting MD: Dr. Gonzella Lex Chief Complaint/Reason for Consult: rule out appendicitis HPI: This is a 51 yo female with an extensive history of nephrolithiasis and lithotripsy who developed right flank and CVA pain on Saturday.  She said the pain did go to her left back as well, but worse on the right side.  It has progressed into her lower back since then.  She has had fevers, headaches, and a sore throat.  She came to the ED where she was found to at least have a UTI and possible pyelonephritis.  She was admitted.  She did have a non-contrasted CT scan that showed a normal appearing appendix with something high density seen within.  But no periappendiceal stranding.  She was admitted and now being treated for pyelonephritis.  Today she c/o dysuria and mild abdominal discomfort with nausea.  She had 2 episodes of diarrhea yesterday, but none since.  We were asked to see to rule out appendicitis.  Review of Systems: Please see HPI, otherwise negative.  History reviewed. No pertinent family history.  Past Medical History  Diagnosis Date  . Kidney stone   . Kidney stones   . Migraine     Past Surgical History  Procedure Date  . Lithotripsy     Social History:  reports that she has quit smoking. She does not have any smokeless tobacco history on file. She reports that she drinks alcohol. She reports that she does not use illicit drugs.  Allergies: No Known Allergies  Medications Prior to Admission  Medication Sig Dispense Refill  . olopatadine (PATANOL) 0.1 % ophthalmic solution Place 1 drop into both eyes 2 (two) times daily.      . SUMAtriptan (IMITREX) 100 MG tablet Take 100 mg by mouth every 2 (two) hours as needed. For migraine        Blood pressure 138/73, pulse 106, temperature 99.7 F (37.6 C), temperature source Oral, resp. rate 18, SpO2 95.00%. Physical Exam: General: pleasant, WD, WN white female who is laying in bed with a  washcloth over her face secondary to a migraine. HEENT: head is normocephalic, atraumatic.  Sclera are noninjected.  PERRL.  Ears and nose without any masses or lesions.  Mouth is pink and moist Heart: slightly tachycardic, but regular rhythm.  Normal s1,s2. No obvious murmurs, gallops, or rubs noted.  Palpable radial and pedal pulses bilaterally Lungs: CTAB, no wheezes, rhonchi, or rales noted.  Respiratory effort nonlabored Abd: soft, minimal right sided abdominal tenderness, ND, +BS, no masses, hernias, or organomegaly.  Her worst pain is located at her right CVA. MS: all 4 extremities are symmetrical with no cyanosis, clubbing, or edema. Skin: warm and dry with no masses, lesions, or rashes Psych: A&Ox3 with an appropriate affect.    Results for orders placed during the hospital encounter of 09/26/11 (from the past 48 hour(s))  POCT I-STAT, CHEM 8     Status: Abnormal   Collection Time   09/27/11 12:06 AM      Component Value Range Comment   Sodium 136  135 - 145 (mEq/L)    Potassium 2.8 (*) 3.5 - 5.1 (mEq/L)    Chloride 102  96 - 112 (mEq/L)    BUN 9  6 - 23 (mg/dL)    Creatinine, Ser 4.78  0.50 - 1.10 (mg/dL)    Glucose, Bld 295 (*) 70 - 99 (mg/dL)    Calcium, Ion 6.21  1.12 - 1.32 (mmol/L)  TCO2 25  0 - 100 (mmol/L)    Hemoglobin 12.9  12.0 - 15.0 (g/dL)    HCT 16.1  09.6 - 04.5 (%)   CBC     Status: Abnormal   Collection Time   09/27/11 12:53 AM      Component Value Range Comment   WBC 11.6 (*) 4.0 - 10.5 (K/uL)    RBC 3.85 (*) 3.87 - 5.11 (MIL/uL)    Hemoglobin 12.4  12.0 - 15.0 (g/dL)    HCT 40.9  81.1 - 91.4 (%)    MCV 94.8  78.0 - 100.0 (fL)    MCH 32.2  26.0 - 34.0 (pg)    MCHC 34.0  30.0 - 36.0 (g/dL)    RDW 78.2  95.6 - 21.3 (%)    Platelets 247  150 - 400 (K/uL)   COMPREHENSIVE METABOLIC PANEL     Status: Abnormal   Collection Time   09/27/11 12:53 AM      Component Value Range Comment   Sodium 134 (*) 135 - 145 (mEq/L)    Potassium 2.5 (*) 3.5 - 5.1 (mEq/L)     Chloride 99  96 - 112 (mEq/L)    CO2 24  19 - 32 (mEq/L)    Glucose, Bld 148 (*) 70 - 99 (mg/dL)    BUN 9  6 - 23 (mg/dL)    Creatinine, Ser 0.86  0.50 - 1.10 (mg/dL)    Calcium 8.6  8.4 - 10.5 (mg/dL)    Total Protein 6.4  6.0 - 8.3 (g/dL)    Albumin 3.2 (*) 3.5 - 5.2 (g/dL)    AST 26  0 - 37 (U/L)    ALT 23  0 - 35 (U/L)    Alkaline Phosphatase 78  39 - 117 (U/L)    Total Bilirubin 0.4  0.3 - 1.2 (mg/dL)    GFR calc non Af Amer >90  >90 (mL/min)    GFR calc Af Amer >90  >90 (mL/min)   LIPASE, BLOOD     Status: Normal   Collection Time   09/27/11 12:53 AM      Component Value Range Comment   Lipase 23  11 - 59 (U/L)   MAGNESIUM     Status: Normal   Collection Time   09/27/11 12:53 AM      Component Value Range Comment   Magnesium 1.7  1.5 - 2.5 (mg/dL)   LACTIC ACID, PLASMA     Status: Normal   Collection Time   09/27/11 12:54 AM      Component Value Range Comment   Lactic Acid, Venous 1.3  0.5 - 2.2 (mmol/L)   URINALYSIS, ROUTINE W REFLEX MICROSCOPIC     Status: Abnormal   Collection Time   09/27/11  4:46 AM      Component Value Range Comment   Color, Urine YELLOW  YELLOW     APPearance CLOUDY (*) CLEAR     Specific Gravity, Urine 1.012  1.005 - 1.030     pH 6.0  5.0 - 8.0     Glucose, UA 250 (*) NEGATIVE (mg/dL)    Hgb urine dipstick LARGE (*) NEGATIVE     Bilirubin Urine NEGATIVE  NEGATIVE     Ketones, ur NEGATIVE  NEGATIVE (mg/dL)    Protein, ur NEGATIVE  NEGATIVE (mg/dL)    Urobilinogen, UA 0.2  0.0 - 1.0 (mg/dL)    Nitrite POSITIVE (*) NEGATIVE     Leukocytes, UA TRACE (*) NEGATIVE    URINE  MICROSCOPIC-ADD ON     Status: Abnormal   Collection Time   09/27/11  4:46 AM      Component Value Range Comment   Squamous Epithelial / LPF RARE  RARE     WBC, UA 7-10  <3 (WBC/hpf)    RBC / HPF 11-20  <3 (RBC/hpf)    Bacteria, UA MANY (*) RARE    CBC     Status: Abnormal   Collection Time   09/27/11  8:42 AM      Component Value Range Comment   WBC 9.5  4.0 - 10.5  (K/uL)    RBC 3.64 (*) 3.87 - 5.11 (MIL/uL)    Hemoglobin 11.8 (*) 12.0 - 15.0 (g/dL)    HCT 78.2 (*) 95.6 - 46.0 (%)    MCV 95.6  78.0 - 100.0 (fL)    MCH 32.4  26.0 - 34.0 (pg)    MCHC 33.9  30.0 - 36.0 (g/dL)    RDW 21.3  08.6 - 57.8 (%)    Platelets 219  150 - 400 (K/uL)   CREATININE, SERUM     Status: Normal   Collection Time   09/27/11  8:42 AM      Component Value Range Comment   Creatinine, Ser 0.61  0.50 - 1.10 (mg/dL)    GFR calc non Af Amer >90  >90 (mL/min)    GFR calc Af Amer >90  >90 (mL/min)    Ct Abdomen Pelvis Wo Contrast  09/27/2011  *RADIOLOGY REPORT*  Clinical Data: Right-sided flank pain, history of renal stones  CT ABDOMEN AND PELVIS WITHOUT CONTRAST  Technique:  Multidetector CT imaging of the abdomen and pelvis was performed following the standard protocol without intravenous contrast.  Comparison: Abdominal CT - 01/12/2011; 07/15/2009  Findings:  The lack of intravenous contrast limits the ability to evaluate solid abdominal organs. There is a pin wheel artifact within the central aspect of the acquired images.  Normal hepatic contour.  Normal noncontrast appearance of the gallbladder.  No ascites.  There are multiple punctate (2-3 mm) nonobstructing stones bilaterally.  No perinephric stranding.  There is an apparent hypoattenuating lesion within the posterior aspect of the superior pole right kidney (image 35), grossly unchanged though incompletely evaluated without a venous contrast.  No stones are seen within the foot the course of either ureter or within the urinary bladder.  No perinephric stranding.  Noncontrast appearance of the bilateral adrenal glands, pancreas and spleen is normal.  Extensive colonic diverticulosis without evidence of diverticulitis on this noncontrast examination.  High density material is now seen within the mid aspect are otherwise normal appearing appendix (image 57, series 2).  This finding is without associated definitive dilatation of the  appendix or peripancreatic stranding. Bowel is otherwise normal in course and caliber without wall thickening or evidence of obstruction.  No pneumoperitoneum, pneumatosis or portal venous gas.  Normal caliber of the abdominal aorta.  Scattered shoddy retroperitoneal lymph nodes are not enlarged by CT criteria.  No retroperitoneal, mesenteric, pelvic or inguinal lymphadenopathy.  Minimal amount of fluid seen within the endometrial canal, possibly physiologic.  No discrete adnexal lesion.  No free fluid in the pelvis.  Limited visualization of the lower thorax is made of focal airspace opacity or pleural effusion.  Normal heart size.  No pericardial effusion.  No acute or aggressive osseous abnormalities.  Scoliotic curvature of the thoracolumbar spine.  Unchanged small mesenteric fat containing periumbilical hernia.  IMPRESSION:  1.  No explanation for patient's right-sided  flank pain. Specifically, no evidence of right-sided urinary obstruction.  2.  Multiple bilateral punctate nonobstructing renal stones. 3.  Minimal amount of high-density material is seen within an otherwise normal-appearing appendix.  This finding is nonspecific but may be suggestive of early appendicolith formation.  4. Extensive colonic diverticulosis without evidence of diverticulitis on this noncontrast examination.  Original Report Authenticated By: Waynard Reeds, M.D.   US Abdomen Complete  09/27/2011  *RADIOLOGY REPORT*  Clinical Data:  Right-sided abdominal pain  COMPLETE ABDOMINAL ULTRASOUND  Comparison:  09/27/2011 CT  Findings: Technically challenging examination due to intraperitoneal fat/poor acoustic windows.  Gallbladder:  The gallbladder is contracted, with nonspecific wall thickening at 6 mm.  No gallstones visualized.  Negative sonographic Murphy's sign.  No pericholecystic fluid.  Common bile duct:  Measures 4 mm, within normal limits.  Liver:  No focal lesion identified.  Within normal limits in parenchymal echogenicity.   IVC:  Appears normal.  Pancreas:  No focal abnormality seen. The body and tail are obscured by bowel gas artifact.  Spleen:  Normal sonographic appearance, measuring 8 mm.  Right Kidney:  Measures 10.5 cm.  No hydronephrosis.  5 mm upper pole echogenic focus, most in keeping with a stone.  In addition, 1.5 cm upper pole nearly anechoic focus, most in keeping with a simple to minimally complex cyst.  Left Kidney:  Measures 10.5 cm.  No hydronephrosis.  6 mm stone within the lower pole.  Abdominal aorta:  No aneurysm identified. Measures up to 2.4 cm in diameter.  IMPRESSION: Contracted gallbladder.  No gallstones or sonographic evidence for cholecystitis.  Bilateral nonobstructing renal stones.  No hydronephrosis.  Original Report Authenticated By: Waneta Martins, M.D.       Assessment/Plan 1. Pyelonephritis  Plan: 1. I do not feel the patient has any evidence of appendicitis.  Her non-contrasted CT scan revealed a normal appearing appendix.  Her story is not consistent with appendicitis either.  I suspect her mild abdominal pain is likely secondary to her pyelonephritis.  We would recommend continuing to treat her for this.  No surgical intervention is needed at this time.  Please call for further assistance.  Jaicey Sweaney E 09/27/2011, 2:16 PM

## 2011-09-27 NOTE — ED Notes (Signed)
Dr Garba at bedside 

## 2011-09-27 NOTE — ED Notes (Signed)
[phone] pt husband Alliana Mcauliff for updates.

## 2011-09-27 NOTE — ED Notes (Signed)
Pt just arrived back from Ultrasound. Pt is resting. No pain at this time. Will continue to monitor.

## 2011-09-27 NOTE — Progress Notes (Signed)
Subjective: Patient seen and examined this morning. C/o severe pain over rt lower flank and RLQ . admission H&P reviewed, Patient had severe pain over rt loin about 36 hrs back and now radiating down to the RLQ.   Objective:  Vital signs in last 24 hours:  Filed Vitals:   09/26/11 2332 09/27/11 0120 09/27/11 0552 09/27/11 0700  BP: 152/80 140/71 137/78 138/73  Pulse: 120 107 101 106  Temp: 101 F (38.3 C)  98.5 F (36.9 C) 99.7 F (37.6 C)  TempSrc: Oral  Oral Oral  Resp: 20 20 19 18   SpO2: 97% 91% 93% 95%    Intake/Output from previous day:  No intake or output data in the 24 hours ending 09/27/11 1203  Physical Exam:  General: Middle aged female in distress with pain HEENT: no pallor, no icterus, dry oral mucosa, no JVD, no lymphadenopathy Heart: Normal  s1 &s2  Regular rate and rhythm, without murmurs, rubs, gallops. Lungs: Clear to auscultation bilaterally. Abdomen: Soft,  nondistended, positive bowel sounds. tendner to palpation over RLQ and rt CVA, rovsing sign negative,  Extremities: No clubbing cyanosis or edema with positive pedal pulses. Neuro: Alert, awake, oriented x3, nonfocal.   Lab Results:  Basic Metabolic Panel:    Component Value Date/Time   NA 134* 09/27/2011 0053   K 2.5* 09/27/2011 0053   CL 99 09/27/2011 0053   CO2 24 09/27/2011 0053   BUN 9 09/27/2011 0053   CREATININE 0.61 09/27/2011 0842   GLUCOSE 148* 09/27/2011 0053   CALCIUM 8.6 09/27/2011 0053   CBC:    Component Value Date/Time   WBC 9.5 09/27/2011 0842   HGB 11.8* 09/27/2011 0842   HCT 34.8* 09/27/2011 0842   PLT 219 09/27/2011 0842   MCV 95.6 09/27/2011 0842   NEUTROABS 6.5 04/07/2011 0955   LYMPHSABS 0.9 04/07/2011 0955   MONOABS 0.5 04/07/2011 0955   EOSABS 0.0 04/07/2011 0955   BASOSABS 0.1 04/07/2011 0955    No results found for this or any previous visit (from the past 240 hour(s)).  Studies/Results: Ct Abdomen Pelvis Wo Contrast  09/27/2011  *RADIOLOGY REPORT*  Clinical Data:  Right-sided flank pain, history of renal stones  CT ABDOMEN AND PELVIS WITHOUT CONTRAST  Technique:  Multidetector CT imaging of the abdomen and pelvis was performed following the standard protocol without intravenous contrast.  Comparison: Abdominal CT - 01/12/2011; 07/15/2009  Findings:  The lack of intravenous contrast limits the ability to evaluate solid abdominal organs. There is a pin wheel artifact within the central aspect of the acquired images.  Normal hepatic contour.  Normal noncontrast appearance of the gallbladder.  No ascites.  There are multiple punctate (2-3 mm) nonobstructing stones bilaterally.  No perinephric stranding.  There is an apparent hypoattenuating lesion within the posterior aspect of the superior pole right kidney (image 35), grossly unchanged though incompletely evaluated without a venous contrast.  No stones are seen within the foot the course of either ureter or within the urinary bladder.  No perinephric stranding.  Noncontrast appearance of the bilateral adrenal glands, pancreas and spleen is normal.  Extensive colonic diverticulosis without evidence of diverticulitis on this noncontrast examination.  High density material is now seen within the mid aspect are otherwise normal appearing appendix (image 57, series 2).  This finding is without associated definitive dilatation of the appendix or peripancreatic stranding. Bowel is otherwise normal in course and caliber without wall thickening or evidence of obstruction.  No pneumoperitoneum, pneumatosis or portal venous gas.  Normal caliber of the abdominal aorta.  Scattered shoddy retroperitoneal lymph nodes are not enlarged by CT criteria.  No retroperitoneal, mesenteric, pelvic or inguinal lymphadenopathy.  Minimal amount of fluid seen within the endometrial canal, possibly physiologic.  No discrete adnexal lesion.  No free fluid in the pelvis.  Limited visualization of the lower thorax is made of focal airspace opacity or pleural  effusion.  Normal heart size.  No pericardial effusion.  No acute or aggressive osseous abnormalities.  Scoliotic curvature of the thoracolumbar spine.  Unchanged small mesenteric fat containing periumbilical hernia.  IMPRESSION:  1.  No explanation for patient's right-sided flank pain. Specifically, no evidence of right-sided urinary obstruction.  2.  Multiple bilateral punctate nonobstructing renal stones. 3.  Minimal amount of high-density material is seen within an otherwise normal-appearing appendix.  This finding is nonspecific but may be suggestive of early appendicolith formation.  4. Extensive colonic diverticulosis without evidence of diverticulitis on this noncontrast examination.  Original Report Authenticated By: Waynard Reeds, M.D.   US Abdomen Complete  09/27/2011  *RADIOLOGY REPORT*  Clinical Data:  Right-sided abdominal pain  COMPLETE ABDOMINAL ULTRASOUND  Comparison:  09/27/2011 CT  Findings: Technically challenging examination due to intraperitoneal fat/poor acoustic windows.  Gallbladder:  The gallbladder is contracted, with nonspecific wall thickening at 6 mm.  No gallstones visualized.  Negative sonographic Murphy's sign.  No pericholecystic fluid.  Common bile duct:  Measures 4 mm, within normal limits.  Liver:  No focal lesion identified.  Within normal limits in parenchymal echogenicity.  IVC:  Appears normal.  Pancreas:  No focal abnormality seen. The body and tail are obscured by bowel gas artifact.  Spleen:  Normal sonographic appearance, measuring 8 mm.  Right Kidney:  Measures 10.5 cm.  No hydronephrosis.  5 mm upper pole echogenic focus, most in keeping with a stone.  In addition, 1.5 cm upper pole nearly anechoic focus, most in keeping with a simple to minimally complex cyst.  Left Kidney:  Measures 10.5 cm.  No hydronephrosis.  6 mm stone within the lower pole.  Abdominal aorta:  No aneurysm identified. Measures up to 2.4 cm in diameter.  IMPRESSION: Contracted gallbladder.  No  gallstones or sonographic evidence for cholecystitis.  Bilateral nonobstructing renal stones.  No hydronephrosis.  Original Report Authenticated By: Waneta Martins, M.D.    Medications: Scheduled Meds:   . cefTRIAXone (ROCEPHIN)  IV  1 g Intravenous Once  . ciprofloxacin  400 mg Intravenous Q12H  . enoxaparin  40 mg Subcutaneous Q24H  .  HYDROmorphone (DILAUDID) injection  1 mg Intravenous Once  . magnesium sulfate 1 - 4 g bolus IVPB  2 g Intravenous Once  . metronidazole  500 mg Intravenous Q8H  . norethindrone-ethinyl estradiol  1 tablet Oral Daily  . olopatadine  1 drop Both Eyes BID  . ondansetron  4 mg Intravenous Once  . potassium chloride  10 mEq Intravenous Once  . potassium chloride  10 mEq Intravenous Once  . potassium chloride  40 mEq Oral BID  . promethazine  25 mg Intravenous Once  . sodium chloride  1,000 mL Intravenous Once  . DISCONTD: cefTRIAXone (ROCEPHIN)  IV  1 g Intravenous Q24H   Continuous Infusions:   . sodium chloride 0.9 % 1,000 mL with potassium chloride 20 mEq infusion 125 mL/hr at 09/27/11 1108  . DISCONTD: sodium chloride 125 mL/hr at 09/27/11 0202  . DISCONTD: sodium chloride     PRN Meds:.acetaminophen, acetaminophen, HYDROcodone-acetaminophen, HYDROmorphone, morphine injection,  ondansetron (ZOFRAN) IV, ondansetron, SUMAtriptan  Assessment/Plan: 52 y/o female with hx of kidney stones and migraine presents with 1 day hx of acute rt loin pain now radiating to rt LQ with fever . UA suggestive of UTI, CT with non obstructive stones but possible early appendicolith.    *Pyelonephritis, acute Admitted to medical floor and started on IV ceftriaxone  urine cx sent Will switch abx to IV cipro ad flagyl with concern for early appendicitis Morphine IV for pain  cont phenergan prn  ? Acute appendicitis  patient has significant RLQ tenderness son exam with CT suggests a possible early appendicolith  will keep NPO  cont IV fluids abx coverage with IV  cirpoa nd flagyl Discussed with Dr Dawna Part from sx who will evaluate pt  Hypokalemia and dehydration Replenish kcl in IV fluids and po  Check EKG Cont aggressive IV hydration  DVT prophylaxis  Full code        Active Problems:  Nephrolithiasis  Hypokalemia  Hyponatremia    LOS: 1 day   Trinh Sanjose 09/27/2011, 12:03 PM

## 2011-09-27 NOTE — ED Notes (Signed)
Transported by Genevie Cheshire EMT.

## 2011-09-27 NOTE — ED Provider Notes (Signed)
History     CSN: [id number]  Arrival date & time 09/26/11  2328   First MD Initiated Contact with Patient 09/26/11 2341      Chief Complaint  Patient presents with  . Flank Pain    (Consider location/radiation/quality/duration/timing/severity/associated sxs/prior treatment) HPI History provided by patient. Has history of kidney stones and about 24 hours go began having right flank pain that is migrating to right side of abdomen. Has associated nausea and tonight some vomiting. No diarrhea. No chills and is noted to have a fever on arrival. No hematuria, dysuria, urgency or frequency. No new medications. Pain is dull in quality and not radiating. Patient believes she has a kidney stone. Past Medical History  Diagnosis Date  . Kidney stone   . Kidney stones   . Migraine     History reviewed. No pertinent past surgical history.  History reviewed. No pertinent family history.  History  Substance Use Topics  . Smoking status: Former Games developer  . Smokeless tobacco: Not on file  . Alcohol Use: Yes     occassionally    OB History    Grav Para Term Preterm Abortions TAB SAB Ect Mult Living                  Review of Systems  Constitutional: Negative for fever and chills.  HENT: Negative for neck pain and neck stiffness.   Eyes: Negative for pain.  Respiratory: Negative for shortness of breath.   Cardiovascular: Negative for chest pain.  Gastrointestinal: Negative for abdominal pain.  Genitourinary: Positive for flank pain. Negative for dysuria.  Musculoskeletal: Negative for back pain.  Skin: Negative for rash.  Neurological: Negative for headaches.  All other systems reviewed and are negative.    Allergies  Review of patient's allergies indicates no known allergies.  Home Medications   Current Outpatient Rx  Name Route Sig Dispense Refill  . OLOPATADINE HCL 0.1 % OP SOLN Both Eyes Place 1 drop into both eyes 2 (two) times daily.    . SUMATRIPTAN SUCCINATE 100 MG  PO TABS Oral Take 100 mg by mouth every 2 (two) hours as needed. For migraine      BP 152/80  Pulse 120  Temp(Src) 101 F (38.3 C) (Oral)  Resp 20  SpO2 97%  Physical Exam  Constitutional: She is oriented to person, place, and time. She appears well-developed and well-nourished.  HENT:  Head: Normocephalic and atraumatic.  Eyes: Conjunctivae and EOM are normal. Pupils are equal, round, and reactive to light.  Neck: Trachea normal. Neck supple. No thyromegaly present.  Cardiovascular: Normal rate, regular rhythm, S1 normal, S2 normal and normal pulses.     No systolic murmur is present   No diastolic murmur is present  Pulses:      Radial pulses are 2+ on the right side, and 2+ on the left side.  Pulmonary/Chest: Effort normal and breath sounds normal. She has no wheezes. She has no rhonchi. She has no rales. She exhibits no tenderness.  Abdominal: Soft. Normal appearance and bowel sounds are normal. There is negative Murphy's sign.       Right CVA tenderness mild with mild right like tenderness. No ecchymosis  Musculoskeletal:       BLE:s Calves nontender, no cords or erythema, negative Homans sign  Neurological: She is alert and oriented to person, place, and time. She has normal strength. No cranial nerve deficit or sensory deficit. GCS eye subscore is 4. GCS verbal subscore is 5. GCS  motor subscore is 6.  Skin: Skin is warm and dry. No rash noted. She is not diaphoretic.  Psychiatric: Her speech is normal.       Cooperative and appropriate    ED Course  Procedures (including critical care time)  Results for orders placed during the hospital encounter of 09/26/11  URINALYSIS, ROUTINE W REFLEX MICROSCOPIC      Component Value Range   Color, Urine YELLOW  YELLOW    APPearance CLOUDY (*) CLEAR    Specific Gravity, Urine 1.012  1.005 - 1.030    pH 6.0  5.0 - 8.0    Glucose, UA 250 (*) NEGATIVE (mg/dL)   Hgb urine dipstick LARGE (*) NEGATIVE    Bilirubin Urine NEGATIVE   NEGATIVE    Ketones, ur NEGATIVE  NEGATIVE (mg/dL)   Protein, ur NEGATIVE  NEGATIVE (mg/dL)   Urobilinogen, UA 0.2  0.0 - 1.0 (mg/dL)   Nitrite POSITIVE (*) NEGATIVE    Leukocytes, UA TRACE (*) NEGATIVE   POCT I-STAT, CHEM 8      Component Value Range   Sodium 136  135 - 145 (mEq/L)   Potassium 2.8 (*) 3.5 - 5.1 (mEq/L)   Chloride 102  96 - 112 (mEq/L)   BUN 9  6 - 23 (mg/dL)   Creatinine, Ser 1.61  0.50 - 1.10 (mg/dL)   Glucose, Bld 096 (*) 70 - 99 (mg/dL)   Calcium, Ion 0.45  4.09 - 1.32 (mmol/L)   TCO2 25  0 - 100 (mmol/L)   Hemoglobin 12.9  12.0 - 15.0 (g/dL)   HCT 81.1  91.4 - 78.2 (%)  CBC      Component Value Range   WBC 11.6 (*) 4.0 - 10.5 (K/uL)   RBC 3.85 (*) 3.87 - 5.11 (MIL/uL)   Hemoglobin 12.4  12.0 - 15.0 (g/dL)   HCT 95.6  21.3 - 08.6 (%)   MCV 94.8  78.0 - 100.0 (fL)   MCH 32.2  26.0 - 34.0 (pg)   MCHC 34.0  30.0 - 36.0 (g/dL)   RDW 57.8  46.9 - 62.9 (%)   Platelets 247  150 - 400 (K/uL)  COMPREHENSIVE METABOLIC PANEL      Component Value Range   Sodium 134 (*) 135 - 145 (mEq/L)   Potassium 2.5 (*) 3.5 - 5.1 (mEq/L)   Chloride 99  96 - 112 (mEq/L)   CO2 24  19 - 32 (mEq/L)   Glucose, Bld 148 (*) 70 - 99 (mg/dL)   BUN 9  6 - 23 (mg/dL)   Creatinine, Ser 5.28  0.50 - 1.10 (mg/dL)   Calcium 8.6  8.4 - 41.3 (mg/dL)   Total Protein 6.4  6.0 - 8.3 (g/dL)   Albumin 3.2 (*) 3.5 - 5.2 (g/dL)   AST 26  0 - 37 (U/L)   ALT 23  0 - 35 (U/L)   Alkaline Phosphatase 78  39 - 117 (U/L)   Total Bilirubin 0.4  0.3 - 1.2 (mg/dL)   GFR calc non Af Amer >90  >90 (mL/min)   GFR calc Af Amer >90  >90 (mL/min)  LIPASE, BLOOD      Component Value Range   Lipase 23  11 - 59 (U/L)  LACTIC ACID, PLASMA      Component Value Range   Lactic Acid, Venous 1.3  0.5 - 2.2 (mmol/L)  URINE MICROSCOPIC-ADD ON      Component Value Range   Squamous Epithelial / LPF RARE  RARE    WBC,  UA 7-10  <3 (WBC/hpf)   RBC / HPF 11-20  <3 (RBC/hpf)   Bacteria, UA MANY (*) RARE    Ct Abdomen  Pelvis Wo Contrast  09/27/2011  *RADIOLOGY REPORT*  Clinical Data: Right-sided flank pain, history of renal stones  CT ABDOMEN AND PELVIS WITHOUT CONTRAST  Technique:  Multidetector CT imaging of the abdomen and pelvis was performed following the standard protocol without intravenous contrast.  Comparison: Abdominal CT - 01/12/2011; 07/15/2009  Findings:  The lack of intravenous contrast limits the ability to evaluate solid abdominal organs. There is a pin wheel artifact within the central aspect of the acquired images.  Normal hepatic contour.  Normal noncontrast appearance of the gallbladder.  No ascites.  There are multiple punctate (2-3 mm) nonobstructing stones bilaterally.  No perinephric stranding.  There is an apparent hypoattenuating lesion within the posterior aspect of the superior pole right kidney (image 35), grossly unchanged though incompletely evaluated without a venous contrast.  No stones are seen within the foot the course of either ureter or within the urinary bladder.  No perinephric stranding.  Noncontrast appearance of the bilateral adrenal glands, pancreas and spleen is normal.  Extensive colonic diverticulosis without evidence of diverticulitis on this noncontrast examination.  High density material is now seen within the mid aspect are otherwise normal appearing appendix (image 57, series 2).  This finding is without associated definitive dilatation of the appendix or peripancreatic stranding. Bowel is otherwise normal in course and caliber without wall thickening or evidence of obstruction.  No pneumoperitoneum, pneumatosis or portal venous gas.  Normal caliber of the abdominal aorta.  Scattered shoddy retroperitoneal lymph nodes are not enlarged by CT criteria.  No retroperitoneal, mesenteric, pelvic or inguinal lymphadenopathy.  Minimal amount of fluid seen within the endometrial canal, possibly physiologic.  No discrete adnexal lesion.  No free fluid in the pelvis.  Limited  visualization of the lower thorax is made of focal airspace opacity or pleural effusion.  Normal heart size.  No pericardial effusion.  No acute or aggressive osseous abnormalities.  Scoliotic curvature of the thoracolumbar spine.  Unchanged small mesenteric fat containing periumbilical hernia.  IMPRESSION:  1.  No explanation for patient's right-sided flank pain. Specifically, no evidence of right-sided urinary obstruction.  2.  Multiple bilateral punctate nonobstructing renal stones. 3.  Minimal amount of high-density material is seen within an otherwise normal-appearing appendix.  This finding is nonspecific but may be suggestive of early appendicolith formation.  4. Extensive colonic diverticulosis without evidence of diverticulitis on this noncontrast examination.  Original Report Authenticated By: Waynard Reeds, M.D.   US Abdomen Complete  09/27/2011  *RADIOLOGY REPORT*  Clinical Data:  Right-sided abdominal pain  COMPLETE ABDOMINAL ULTRASOUND  Comparison:  09/27/2011 CT  Findings: Technically challenging examination due to intraperitoneal fat/poor acoustic windows.  Gallbladder:  The gallbladder is contracted, with nonspecific wall thickening at 6 mm.  No gallstones visualized.  Negative sonographic Murphy's sign.  No pericholecystic fluid.  Common bile duct:  Measures 4 mm, within normal limits.  Liver:  No focal lesion identified.  Within normal limits in parenchymal echogenicity.  IVC:  Appears normal.  Pancreas:  No focal abnormality seen. The body and tail are obscured by bowel gas artifact.  Spleen:  Normal sonographic appearance, measuring 8 mm.  Right Kidney:  Measures 10.5 cm.  No hydronephrosis.  5 mm upper pole echogenic focus, most in keeping with a stone.  In addition, 1.5 cm upper pole nearly anechoic focus, most in  keeping with a simple to minimally complex cyst.  Left Kidney:  Measures 10.5 cm.  No hydronephrosis.  6 mm stone within the lower pole.  Abdominal aorta:  No aneurysm  identified. Measures up to 2.4 cm in diameter.  IMPRESSION: Contracted gallbladder.  No gallstones or sonographic evidence for cholecystitis.  Bilateral nonobstructing renal stones.  No hydronephrosis.  Original Report Authenticated By: Waneta Martins, M.D.    IV fluids and pain medications   MDM   Pyelonephritis.   Imaging, UA and labs obtained and reviewed as above. Patient continues to feel poorly on recheck.  IV antibiotics initiated and medicine consult for admission. Case discussed with Dr. Mikeal Hawthorne, who agrees to admission.        Sunnie Nielsen, MD 09/27/11 [phone]

## 2011-09-27 NOTE — ED Notes (Signed)
Husband made aware of pt status, per pts desires.

## 2011-09-27 NOTE — H&P (Signed)
Monique Hunt is an 51 y.o. female.   Chief Complaint: Flank pain HPI: A 51 year old female with history of recurrent kidney stones presenting today with severe right flank pain. Pain is rated as 10 out of 10 mainly on the right radiating down to her groin. She has been having symptoms for 2 days now. It is associated with nausea vomiting. She denied diarrhea or constipation. She's had some mild chills and fever as well as generalized weakness. Patient had workup in the ED included a CT scan and ultrasound that showed no obstructing kidney stone. Hi urinalysis however showed evidence of acute pyelonephritis. She's also have evidence of hypokalemia dehydration.  Past Medical History  Diagnosis Date  . Kidney stone   . Kidney stones   . Migraine     History reviewed. No pertinent past surgical history.  History reviewed. No pertinent family history. Social History:  reports that she has quit smoking. She does not have any smokeless tobacco history on file. She reports that she drinks alcohol. She reports that she does not use illicit drugs.  Allergies: No Known Allergies   (Not in a hospital admission)  Results for orders placed during the hospital encounter of 09/26/11 (from the past 48 hour(s))  POCT I-STAT, CHEM 8     Status: Abnormal   Collection Time   09/27/11 12:06 AM      Component Value Range Comment   Sodium 136  135 - 145 (mEq/L)    Potassium 2.8 (*) 3.5 - 5.1 (mEq/L)    Chloride 102  96 - 112 (mEq/L)    BUN 9  6 - 23 (mg/dL)    Creatinine, Ser 1.19  0.50 - 1.10 (mg/dL)    Glucose, Bld 147 (*) 70 - 99 (mg/dL)    Calcium, Ion 8.29  1.12 - 1.32 (mmol/L)    TCO2 25  0 - 100 (mmol/L)    Hemoglobin 12.9  12.0 - 15.0 (g/dL)    HCT 56.2  13.0 - 86.5 (%)   CBC     Status: Abnormal   Collection Time   09/27/11 12:53 AM      Component Value Range Comment   WBC 11.6 (*) 4.0 - 10.5 (K/uL)    RBC 3.85 (*) 3.87 - 5.11 (MIL/uL)    Hemoglobin 12.4  12.0 - 15.0 (g/dL)    HCT 78.4   69.6 - 29.5 (%)    MCV 94.8  78.0 - 100.0 (fL)    MCH 32.2  26.0 - 34.0 (pg)    MCHC 34.0  30.0 - 36.0 (g/dL)    RDW 28.4  13.2 - 44.0 (%)    Platelets 247  150 - 400 (K/uL)   COMPREHENSIVE METABOLIC PANEL     Status: Abnormal   Collection Time   09/27/11 12:53 AM      Component Value Range Comment   Sodium 134 (*) 135 - 145 (mEq/L)    Potassium 2.5 (*) 3.5 - 5.1 (mEq/L)    Chloride 99  96 - 112 (mEq/L)    CO2 24  19 - 32 (mEq/L)    Glucose, Bld 148 (*) 70 - 99 (mg/dL)    BUN 9  6 - 23 (mg/dL)    Creatinine, Ser 1.02  0.50 - 1.10 (mg/dL)    Calcium 8.6  8.4 - 10.5 (mg/dL)    Total Protein 6.4  6.0 - 8.3 (g/dL)    Albumin 3.2 (*) 3.5 - 5.2 (g/dL)    AST 26  0 -  37 (U/L)    ALT 23  0 - 35 (U/L)    Alkaline Phosphatase 78  39 - 117 (U/L)    Total Bilirubin 0.4  0.3 - 1.2 (mg/dL)    GFR calc non Af Amer >90  >90 (mL/min)    GFR calc Af Amer >90  >90 (mL/min)   LIPASE, BLOOD     Status: Normal   Collection Time   09/27/11 12:53 AM      Component Value Range Comment   Lipase 23  11 - 59 (U/L)   LACTIC ACID, PLASMA     Status: Normal   Collection Time   09/27/11 12:54 AM      Component Value Range Comment   Lactic Acid, Venous 1.3  0.5 - 2.2 (mmol/L)   URINALYSIS, ROUTINE W REFLEX MICROSCOPIC     Status: Abnormal   Collection Time   09/27/11  4:46 AM      Component Value Range Comment   Color, Urine YELLOW  YELLOW     APPearance CLOUDY (*) CLEAR     Specific Gravity, Urine 1.012  1.005 - 1.030     pH 6.0  5.0 - 8.0     Glucose, UA 250 (*) NEGATIVE (mg/dL)    Hgb urine dipstick LARGE (*) NEGATIVE     Bilirubin Urine NEGATIVE  NEGATIVE     Ketones, ur NEGATIVE  NEGATIVE (mg/dL)    Protein, ur NEGATIVE  NEGATIVE (mg/dL)    Urobilinogen, UA 0.2  0.0 - 1.0 (mg/dL)    Nitrite POSITIVE (*) NEGATIVE     Leukocytes, UA TRACE (*) NEGATIVE    URINE MICROSCOPIC-ADD ON     Status: Abnormal   Collection Time   09/27/11  4:46 AM      Component Value Range Comment   Squamous Epithelial /  LPF RARE  RARE     WBC, UA 7-10  <3 (WBC/hpf)    RBC / HPF 11-20  <3 (RBC/hpf)    Bacteria, UA MANY (*) RARE     Ct Abdomen Pelvis Wo Contrast  09/27/2011  *RADIOLOGY REPORT*  Clinical Data: Right-sided flank pain, history of renal stones  CT ABDOMEN AND PELVIS WITHOUT CONTRAST  Technique:  Multidetector CT imaging of the abdomen and pelvis was performed following the standard protocol without intravenous contrast.  Comparison: Abdominal CT - 01/12/2011; 07/15/2009  Findings:  The lack of intravenous contrast limits the ability to evaluate solid abdominal organs. There is a pin wheel artifact within the central aspect of the acquired images.  Normal hepatic contour.  Normal noncontrast appearance of the gallbladder.  No ascites.  There are multiple punctate (2-3 mm) nonobstructing stones bilaterally.  No perinephric stranding.  There is an apparent hypoattenuating lesion within the posterior aspect of the superior pole right kidney (image 35), grossly unchanged though incompletely evaluated without a venous contrast.  No stones are seen within the foot the course of either ureter or within the urinary bladder.  No perinephric stranding.  Noncontrast appearance of the bilateral adrenal glands, pancreas and spleen is normal.  Extensive colonic diverticulosis without evidence of diverticulitis on this noncontrast examination.  High density material is now seen within the mid aspect are otherwise normal appearing appendix (image 57, series 2).  This finding is without associated definitive dilatation of the appendix or peripancreatic stranding. Bowel is otherwise normal in course and caliber without wall thickening or evidence of obstruction.  No pneumoperitoneum, pneumatosis or portal venous gas.  Normal caliber of the abdominal  aorta.  Scattered shoddy retroperitoneal lymph nodes are not enlarged by CT criteria.  No retroperitoneal, mesenteric, pelvic or inguinal lymphadenopathy.  Minimal amount of fluid seen  within the endometrial canal, possibly physiologic.  No discrete adnexal lesion.  No free fluid in the pelvis.  Limited visualization of the lower thorax is made of focal airspace opacity or pleural effusion.  Normal heart size.  No pericardial effusion.  No acute or aggressive osseous abnormalities.  Scoliotic curvature of the thoracolumbar spine.  Unchanged small mesenteric fat containing periumbilical hernia.  IMPRESSION:  1.  No explanation for patient's right-sided flank pain. Specifically, no evidence of right-sided urinary obstruction.  2.  Multiple bilateral punctate nonobstructing renal stones. 3.  Minimal amount of high-density material is seen within an otherwise normal-appearing appendix.  This finding is nonspecific but may be suggestive of early appendicolith formation.  4. Extensive colonic diverticulosis without evidence of diverticulitis on this noncontrast examination.  Original Report Authenticated By: Waynard Reeds, M.D.   US Abdomen Complete  09/27/2011  *RADIOLOGY REPORT*  Clinical Data:  Right-sided abdominal pain  COMPLETE ABDOMINAL ULTRASOUND  Comparison:  09/27/2011 CT  Findings: Technically challenging examination due to intraperitoneal fat/poor acoustic windows.  Gallbladder:  The gallbladder is contracted, with nonspecific wall thickening at 6 mm.  No gallstones visualized.  Negative sonographic Murphy's sign.  No pericholecystic fluid.  Common bile duct:  Measures 4 mm, within normal limits.  Liver:  No focal lesion identified.  Within normal limits in parenchymal echogenicity.  IVC:  Appears normal.  Pancreas:  No focal abnormality seen. The body and tail are obscured by bowel gas artifact.  Spleen:  Normal sonographic appearance, measuring 8 mm.  Right Kidney:  Measures 10.5 cm.  No hydronephrosis.  5 mm upper pole echogenic focus, most in keeping with a stone.  In addition, 1.5 cm upper pole nearly anechoic focus, most in keeping with a simple to minimally complex cyst.  Left  Kidney:  Measures 10.5 cm.  No hydronephrosis.  6 mm stone within the lower pole.  Abdominal aorta:  No aneurysm identified. Measures up to 2.4 cm in diameter.  IMPRESSION: Contracted gallbladder.  No gallstones or sonographic evidence for cholecystitis.  Bilateral nonobstructing renal stones.  No hydronephrosis.  Original Report Authenticated By: Waneta Martins, M.D.    Review of Systems  Constitutional: Positive for fever and chills.  HENT: Negative.   Eyes: Negative.   Respiratory: Negative.   Cardiovascular: Negative.   Gastrointestinal: Negative.   Genitourinary: Positive for dysuria, hematuria and flank pain.  Musculoskeletal: Negative.   Skin: Negative.   Neurological: Negative.   Endo/Heme/Allergies: Negative.   Psychiatric/Behavioral: Negative.     Blood pressure 137/78, pulse 101, temperature 98.5 F (36.9 C), temperature source Oral, resp. rate 19, SpO2 93.00%. Physical Exam  Constitutional: She is oriented to person, place, and time. She appears well-developed and well-nourished.  HENT:  Head: Normocephalic and atraumatic.  Right Ear: External ear normal.  Left Ear: External ear normal.  Nose: Nose normal.  Mouth/Throat: Oropharynx is clear and moist.  Eyes: Conjunctivae and EOM are normal. Pupils are equal, round, and reactive to light.  Neck: Normal range of motion. Neck supple.  Cardiovascular: Normal rate, regular rhythm, normal heart sounds and intact distal pulses.   Respiratory: Effort normal and breath sounds normal.  GI: Soft. Bowel sounds are normal. There is tenderness.  Musculoskeletal: Normal range of motion.  Neurological: She is alert and oriented to person, place, and time. She has  normal reflexes.  Skin: Skin is warm and dry.  Psychiatric: She has a normal mood and affect. Her behavior is normal. Judgment and thought content normal.     Assessment/Plan Assessment this is a 51 year old female presenting with acute pyelonephritis and history of  kidney stones with nonobstructing kidney stones at this point. This is worrisome for infected stones and she is at risk for sepsis. Plan #1 acute pyelonephritis: Patient will be admitted for IV antibiotics we'll get blood and urine culture and sensitivities we'll give her IV Rocephin empirically and 2 week and get cultures back to see if we can change the antibiotics. She has no obstructive stones so she will not need any urologic intervention at this point. #2 nephrolithiasis: This is recurrent and patient has non-obstructive stones at this point. It is possible that she has passed a number of stones. #3 hypokalemia: More than likely from nausea vomiting associated with her pyelonephritis. She however drinks alcohol or above normal limits so possibly she could have low magnesium. We will check, magnesium level and replete her potassium. #4 hyponatremia: Will hydrate the patient was saline. We'll also follow her sodium level   Julianna Vanwagner,LAWAL 09/27/2011, 5:54 AM

## 2011-09-27 NOTE — ED Notes (Signed)
Received Report from [address]. Pt came to the ED because she is having right flank pain with nausea and vomiting. Symptoms started yesterday.  No cardiac or respiratory distress. Currently pt is resting. Husband at bedside. Will continue to monitor.

## 2011-09-27 NOTE — Consult Note (Signed)
Her symptoms and exam are not classic for appendicitis.  She is actually feeling better and her discomfort is mostly flank and back pain currently.  CT not very impressive for any appendicitis.  I do not think that this is appendicitis but we will check in on her again to make sure that she continues to improve.

## 2011-09-27 NOTE — ED Notes (Signed)
RN unavailable to take report at this time. Will continue to monitor.

## 2011-09-28 LAB — BASIC METABOLIC PANEL
CO2: 24 mEq/L (ref 19–32)
Chloride: 104 mEq/L (ref 96–112)
Glucose, Bld: 125 mg/dL — ABNORMAL HIGH (ref 70–99)
Sodium: 136 mEq/L (ref 135–145)

## 2011-09-28 LAB — CBC
HCT: 35.3 % — ABNORMAL LOW (ref 36.0–46.0)
Hemoglobin: 11.9 g/dL — ABNORMAL LOW (ref 12.0–15.0)
MCV: 95.7 fL (ref 78.0–100.0)
RBC: 3.69 MIL/uL — ABNORMAL LOW (ref 3.87–5.11)
WBC: 9.2 10*3/uL (ref 4.0–10.5)

## 2011-09-28 MED ORDER — HYDROMORPHONE HCL PF 1 MG/ML IJ SOLN: 0.5000 mg | Freq: Once | INTRAMUSCULAR | Status: DC

## 2011-09-28 MED ORDER — SODIUM CHLORIDE 0.9 % IV SOLN
INTRAVENOUS | Status: DC
  Administered 2011-09-29: 04:00:00 via INTRAVENOUS

## 2011-09-28 NOTE — Progress Notes (Signed)
   CARE MANAGEMENT NOTE 09/28/2011  Patient:  Monique, Hunt   Account Number:  [id number]  Date Initiated:  09/27/2011  Documentation initiated by:  Darlyne Russian  Subjective/Objective Assessment:   Patient admitted with pyelonephritis     Action/Plan:   Progression of care and discharge planning   Anticipated DC Date:  09/30/2011   Anticipated DC Plan:  HOME/SELF CARE      DC Planning Services  CM consult      Choice offered to / List presented to:             Status of service:  In process, will continue to follow Medicare Important Message given?   (If response is "NO", the following Medicare IM given date fields will be blank) Date Medicare IM given:   Date Additional Medicare IM given:    Discharge Disposition:    Per UR Regulation:  Reviewed for med. necessity/level of care/duration of stay  If discussed at Long Length of Stay Meetings, dates discussed:    Comments:  09/28/11 Onnie Boer, RN, BSN 1621 PT WAS ADMITTED WITH UTI, PYELONEPHRITIS.  PTA PT WAS AT HOME WITH SELF CARE AS HER HUSBAND TRAVELS DURING THE WEEK AND HER CHILDREN ARE GROWN.  WILL F/U ON DC NEEDS.  09/27/2011 1430 Darlyne Russian RN [phone] Utilization review completed.

## 2011-09-28 NOTE — Progress Notes (Signed)
Subjective: Her main complaint is HA.  Says that her abdominal pain is improved and still has some residual back pain but this is also improved.  Objective: Vital signs in last 24 hours: Temp:  [98 F (36.7 C)-102.3 F (39.1 C)] 99 F (37.2 C) (04/30 0606) Pulse Rate:  [92-124] 100  (04/30 0606) Resp:  [18-20] 20  (04/30 0606) BP: (132-159)/(67-77) 132/67 mmHg (04/30 0606) SpO2:  [96 %-98 %] 96 % (04/30 0606) Weight:  [130 lb (58.968 kg)] 130 lb (58.968 kg) (04/29 1700) Last BM Date: 09/26/11  Intake/Output from previous day: 04/29 0701 - 04/30 0700 In: 0  Out: 5 [Urine:5] Intake/Output this shift:    General appearance: cooperative and no distress GI: soft, no significant abdominal tenderness, no peritoneal signs  Lab Results:  @LABLAST2 (wbc:2,hgb:2,hct:2,plt:2) BMET  Basename 09/28/11 1610 09/27/11 1712 09/27/11 0842 09/27/11 0053  NA 136 -- -- 134*  K 4.3 3.9 -- --  CL 104 -- -- 99  CO2 24 -- -- 24  GLUCOSE 125* -- -- 148*  BUN 3* -- -- 9  CREATININE 0.55 -- 0.61 --  CALCIUM 8.4 -- -- 8.6   PT/INR No results found for this basename: LABPROT:2,INR:2 in the last 72 hours ABG No results found for this basename: PHART:2,PCO2:2,PO2:2,HCO3:2 in the last 72 hours  Studies/Results: Ct Abdomen Pelvis Wo Contrast  09/27/2011  *RADIOLOGY REPORT*  Clinical Data: Right-sided flank pain, history of renal stones  CT ABDOMEN AND PELVIS WITHOUT CONTRAST  Technique:  Multidetector CT imaging of the abdomen and pelvis was performed following the standard protocol without intravenous contrast.  Comparison: Abdominal CT - 01/12/2011; 07/15/2009  Findings:  The lack of intravenous contrast limits the ability to evaluate solid abdominal organs. There is a pin wheel artifact within the central aspect of the acquired images.  Normal hepatic contour.  Normal noncontrast appearance of the gallbladder.  No ascites.  There are multiple punctate (2-3 mm) nonobstructing stones bilaterally.  No  perinephric stranding.  There is an apparent hypoattenuating lesion within the posterior aspect of the superior pole right kidney (image 35), grossly unchanged though incompletely evaluated without a venous contrast.  No stones are seen within the foot the course of either ureter or within the urinary bladder.  No perinephric stranding.  Noncontrast appearance of the bilateral adrenal glands, pancreas and spleen is normal.  Extensive colonic diverticulosis without evidence of diverticulitis on this noncontrast examination.  High density material is now seen within the mid aspect are otherwise normal appearing appendix (image 57, series 2).  This finding is without associated definitive dilatation of the appendix or peripancreatic stranding. Bowel is otherwise normal in course and caliber without wall thickening or evidence of obstruction.  No pneumoperitoneum, pneumatosis or portal venous gas.  Normal caliber of the abdominal aorta.  Scattered shoddy retroperitoneal lymph nodes are not enlarged by CT criteria.  No retroperitoneal, mesenteric, pelvic or inguinal lymphadenopathy.  Minimal amount of fluid seen within the endometrial canal, possibly physiologic.  No discrete adnexal lesion.  No free fluid in the pelvis.  Limited visualization of the lower thorax is made of focal airspace opacity or pleural effusion.  Normal heart size.  No pericardial effusion.  No acute or aggressive osseous abnormalities.  Scoliotic curvature of the thoracolumbar spine.  Unchanged small mesenteric fat containing periumbilical hernia.  IMPRESSION:  1.  No explanation for patient's right-sided flank pain. Specifically, no evidence of right-sided urinary obstruction.  2.  Multiple bilateral punctate nonobstructing renal stones. 3.  Minimal amount  of high-density material is seen within an otherwise normal-appearing appendix.  This finding is nonspecific but may be suggestive of early appendicolith formation.  4. Extensive colonic  diverticulosis without evidence of diverticulitis on this noncontrast examination.  Original Report Authenticated By: Waynard Reeds, M.D.   US Abdomen Complete  09/27/2011  *RADIOLOGY REPORT*  Clinical Data:  Right-sided abdominal pain  COMPLETE ABDOMINAL ULTRASOUND  Comparison:  09/27/2011 CT  Findings: Technically challenging examination due to intraperitoneal fat/poor acoustic windows.  Gallbladder:  The gallbladder is contracted, with nonspecific wall thickening at 6 mm.  No gallstones visualized.  Negative sonographic Murphy's sign.  No pericholecystic fluid.  Common bile duct:  Measures 4 mm, within normal limits.  Liver:  No focal lesion identified.  Within normal limits in parenchymal echogenicity.  IVC:  Appears normal.  Pancreas:  No focal abnormality seen. The body and tail are obscured by bowel gas artifact.  Spleen:  Normal sonographic appearance, measuring 8 mm.  Right Kidney:  Measures 10.5 cm.  No hydronephrosis.  5 mm upper pole echogenic focus, most in keeping with a stone.  In addition, 1.5 cm upper pole nearly anechoic focus, most in keeping with a simple to minimally complex cyst.  Left Kidney:  Measures 10.5 cm.  No hydronephrosis.  6 mm stone within the lower pole.  Abdominal aorta:  No aneurysm identified. Measures up to 2.4 cm in diameter.  IMPRESSION: Contracted gallbladder.  No gallstones or sonographic evidence for cholecystitis.  Bilateral nonobstructing renal stones.  No hydronephrosis.  Original Report Authenticated By: Waneta Martins, M.D.    Anti-infectives: Anti-infectives     Start     Dose/Rate Route Frequency Ordered Stop   09/28/11 0600   cefTRIAXone (ROCEPHIN) 1 g in dextrose 5 % 50 mL IVPB  Status:  Discontinued        1 g 100 mL/hr over 30 Minutes Intravenous Every 24 hours 09/27/11 0703 09/27/11 0757   09/27/11 0900   metroNIDAZOLE (FLAGYL) IVPB 500 mg  Status:  Discontinued        500 mg 100 mL/hr over 60 Minutes Intravenous Every 8 hours 09/27/11 0754  09/27/11 1450   09/27/11 0900   ciprofloxacin (CIPRO) IVPB 400 mg        400 mg 200 mL/hr over 60 Minutes Intravenous Every 12 hours 09/27/11 0757     09/27/11 0545   cefTRIAXone (ROCEPHIN) 1 g in dextrose 5 % 50 mL IVPB        1 g 100 mL/hr over 30 Minutes Intravenous  Once 09/27/11 0539 09/27/11 0617          Assessment/Plan: s/p  Low suspicion for appendicitis since her abdominal pain continues to improve and this is most likely due to her UTI and pyelo.  WBC still normal.    LOS: 2 days    Lodema Pilot DAVID 09/28/2011

## 2011-09-28 NOTE — Progress Notes (Signed)
Subjective: Patient seen and examined today. Still has some rt flank pain  But better on pain meds. No dysuria. Has been afebrile overnight. No dysuria.   Objective:  Vital signs in last 24 hours:  Filed Vitals:   09/27/11 1700 09/27/11 2058 09/28/11 0606 09/28/11 1417  BP:  155/77 132/67 150/85  Pulse:  92 100 90  Temp:  98 F (36.7 C) 99 F (37.2 C) 98.4 F (36.9 C)  TempSrc:      Resp:  18 20 18   Height: 5\' 4"  (1.626 m)     Weight: 58.968 kg (130 lb)     SpO2:  98% 96% 99%    Intake/Output from previous day:   Intake/Output Summary (Last 24 hours) at 09/28/11 1557 Last data filed at 09/28/11 1300  Gross per 24 hour  Intake    480 ml  Output      3 ml  Net    477 ml    Physical Exam:  General: , in no acute distress. HEENT: no pallor, no icterus, moist oral mucosa, no JVD, no lymphadenopathy Heart: Normal  s1 &s2  Regular rate and rhythm, without murmurs, rubs, gallops. Lungs: Clear to auscultation bilaterally. Abdomen: Soft, nontender, nondistended, positive bowel sounds. Rt CVA tenderness  Extremities: No clubbing cyanosis or edema with positive pedal pulses. Neuro: Alert, awake, oriented x3, nonfocal.   Lab Results:  Basic Metabolic Panel:    Component Value Date/Time   NA 136 09/28/2011 0628   K 4.3 09/28/2011 0628   CL 104 09/28/2011 0628   CO2 24 09/28/2011 0628   BUN 3* 09/28/2011 0628   CREATININE 0.55 09/28/2011 0628   GLUCOSE 125* 09/28/2011 0628   CALCIUM 8.4 09/28/2011 0628   CBC:    Component Value Date/Time   WBC 9.2 09/28/2011 0628   HGB 11.9* 09/28/2011 0628   HCT 35.3* 09/28/2011 0628   PLT 223 09/28/2011 0628   MCV 95.7 09/28/2011 0628   NEUTROABS 6.5 04/07/2011 0955   LYMPHSABS 0.9 04/07/2011 0955   MONOABS 0.5 04/07/2011 0955   EOSABS 0.0 04/07/2011 0955   BASOSABS 0.1 04/07/2011 0955    No results found for this or any previous visit (from the past 240 hour(s)).  Studies/Results: Ct Abdomen Pelvis Wo Contrast  09/27/2011  *RADIOLOGY  REPORT*  Clinical Data: Right-sided flank pain, history of renal stones  CT ABDOMEN AND PELVIS WITHOUT CONTRAST  Technique:  Multidetector CT imaging of the abdomen and pelvis was performed following the standard protocol without intravenous contrast.  Comparison: Abdominal CT - 01/12/2011; 07/15/2009  Findings:  The lack of intravenous contrast limits the ability to evaluate solid abdominal organs. There is a pin wheel artifact within the central aspect of the acquired images.  Normal hepatic contour.  Normal noncontrast appearance of the gallbladder.  No ascites.  There are multiple punctate (2-3 mm) nonobstructing stones bilaterally.  No perinephric stranding.  There is an apparent hypoattenuating lesion within the posterior aspect of the superior pole right kidney (image 35), grossly unchanged though incompletely evaluated without a venous contrast.  No stones are seen within the foot the course of either ureter or within the urinary bladder.  No perinephric stranding.  Noncontrast appearance of the bilateral adrenal glands, pancreas and spleen is normal.  Extensive colonic diverticulosis without evidence of diverticulitis on this noncontrast examination.  High density material is now seen within the mid aspect are otherwise normal appearing appendix (image 57, series 2).  This finding is without associated definitive dilatation of  the appendix or peripancreatic stranding. Bowel is otherwise normal in course and caliber without wall thickening or evidence of obstruction.  No pneumoperitoneum, pneumatosis or portal venous gas.  Normal caliber of the abdominal aorta.  Scattered shoddy retroperitoneal lymph nodes are not enlarged by CT criteria.  No retroperitoneal, mesenteric, pelvic or inguinal lymphadenopathy.  Minimal amount of fluid seen within the endometrial canal, possibly physiologic.  No discrete adnexal lesion.  No free fluid in the pelvis.  Limited visualization of the lower thorax is made of focal  airspace opacity or pleural effusion.  Normal heart size.  No pericardial effusion.  No acute or aggressive osseous abnormalities.  Scoliotic curvature of the thoracolumbar spine.  Unchanged small mesenteric fat containing periumbilical hernia.  IMPRESSION:  1.  No explanation for patient's right-sided flank pain. Specifically, no evidence of right-sided urinary obstruction.  2.  Multiple bilateral punctate nonobstructing renal stones. 3.  Minimal amount of high-density material is seen within an otherwise normal-appearing appendix.  This finding is nonspecific but may be suggestive of early appendicolith formation.  4. Extensive colonic diverticulosis without evidence of diverticulitis on this noncontrast examination.  Original Report Authenticated By: Waynard Reeds, M.D.   US Abdomen Complete  09/27/2011  *RADIOLOGY REPORT*  Clinical Data:  Right-sided abdominal pain  COMPLETE ABDOMINAL ULTRASOUND  Comparison:  09/27/2011 CT  Findings: Technically challenging examination due to intraperitoneal fat/poor acoustic windows.  Gallbladder:  The gallbladder is contracted, with nonspecific wall thickening at 6 mm.  No gallstones visualized.  Negative sonographic Murphy's sign.  No pericholecystic fluid.  Common bile duct:  Measures 4 mm, within normal limits.  Liver:  No focal lesion identified.  Within normal limits in parenchymal echogenicity.  IVC:  Appears normal.  Pancreas:  No focal abnormality seen. The body and tail are obscured by bowel gas artifact.  Spleen:  Normal sonographic appearance, measuring 8 mm.  Right Kidney:  Measures 10.5 cm.  No hydronephrosis.  5 mm upper pole echogenic focus, most in keeping with a stone.  In addition, 1.5 cm upper pole nearly anechoic focus, most in keeping with a simple to minimally complex cyst.  Left Kidney:  Measures 10.5 cm.  No hydronephrosis.  6 mm stone within the lower pole.  Abdominal aorta:  No aneurysm identified. Measures up to 2.4 cm in diameter.  IMPRESSION:  Contracted gallbladder.  No gallstones or sonographic evidence for cholecystitis.  Bilateral nonobstructing renal stones.  No hydronephrosis.  Original Report Authenticated By: Waneta Martins, M.D.    Medications: Scheduled Meds:   . ciprofloxacin  400 mg Intravenous Q12H  . enoxaparin  40 mg Subcutaneous Q24H  . norethindrone-ethinyl estradiol  1 tablet Oral Daily  . olopatadine  1 drop Both Eyes BID   Continuous Infusions:   . sodium chloride 0.9 % 1,000 mL with potassium chloride 20 mEq infusion 100 mL/hr at 09/28/11 1529   PRN Meds:.acetaminophen, acetaminophen, HYDROcodone-acetaminophen, HYDROmorphone, morphine injection, ondansetron (ZOFRAN) IV, ondansetron, SUMAtriptan   Assessment/Plan:  51 y/o female with hx of kidney stones and migraine presents with 1 day hx of acute rt loin pain now radiating to rt LQ with fever . UA suggestive of UTI, CT with non obstructive stones but possible early appendicolith.   *Pyelonephritis, acute  Admitted to medical floor and started on IV ceftriaxone . abx switched to IV cipro ( day 2)  urine cx sent and pending Morphine IV prn for pain  cont phenergan prn  symptomatically improving and tolerating diet. No temp spike  today. Tmax last 24 hr of 102.3 ? Acute appendicitis  patient had significant RLQ tenderness on exam on day 1 with CT suggests a possible early appendicolith  Seen by surgery consult ( Dr Biagio Quint) and recommends this likely UTI/ Pyelonephritis and  clinically unlikely to be appendicitis  Hypokalemia and dehydration  k of 2.5 on presentation with low mg Replenished kcl in IV fluids and po  Cont IV hydration  Hypokalemia now resolved    DVT prophylaxis   Full code   dispo  home possibly tomorrow if pain continues to improve and patient remains afebrile  please check urine cx results. Blood cx pending  Consult: [facility] ( gen surgery)  Plan discussed with patient and her husband at bedside   LOS: 2 days    Monique Hunt 09/28/2011, 3:57 PM

## 2011-09-29 DIAGNOSIS — R5381 Other malaise: Secondary | ICD-10-CM

## 2011-09-29 DIAGNOSIS — E876 Hypokalemia: Secondary | ICD-10-CM

## 2011-09-29 DIAGNOSIS — E871 Hypo-osmolality and hyponatremia: Secondary | ICD-10-CM

## 2011-09-29 DIAGNOSIS — N200 Calculus of kidney: Secondary | ICD-10-CM

## 2011-09-29 LAB — URINE CULTURE: Culture  Setup Time: 201304291702

## 2011-09-29 MED ORDER — HYDROCODONE-ACETAMINOPHEN 5-325 MG PO TABS: 1.0000 | ORAL_TABLET | ORAL | Status: AC | PRN

## 2011-09-29 MED ORDER — HYDROMORPHONE HCL 4 MG PO TABS: 4.0000 mg | ORAL_TABLET | ORAL | Status: AC | PRN

## 2011-09-29 MED ORDER — PROMETHAZINE HCL 50 MG PO TABS: 50.0000 mg | ORAL_TABLET | Freq: Four times a day (QID) | ORAL | Status: DC | PRN

## 2011-09-29 MED ORDER — CIPROFLOXACIN HCL 500 MG PO TABS: 500.0000 mg | ORAL_TABLET | Freq: Two times a day (BID) | ORAL | Status: AC

## 2011-09-29 NOTE — Progress Notes (Signed)
   CARE MANAGEMENT NOTE 09/29/2011  Patient:  Monique Hunt, Monique Hunt   Account Number:  [id number]  Date Initiated:  09/27/2011  Documentation initiated by:  Darlyne Russian  Subjective/Objective Assessment:   Patient admitted with pyelonephritis     Action/Plan:   Progression of care and discharge planning   Anticipated DC Date:  09/30/2011   Anticipated DC Plan:  HOME/SELF CARE      DC Planning Services  CM consult      Choice offered to / List presented to:             Status of service:  Completed, signed off Medicare Important Message given?   (If response is "NO", the following Medicare IM given date fields will be blank) Date Medicare IM given:   Date Additional Medicare IM given:    Discharge Disposition:  HOME/SELF CARE  Per UR Regulation:  Reviewed for med. necessity/level of care/duration of stay  If discussed at Long Length of Stay Meetings, dates discussed:    Comments:  09/28/11 Onnie Boer, RN, BSN 1621 PT WAS ADMITTED WITH UTI, PYELONEPHRITIS.  PTA PT WAS AT HOME WITH SELF CARE AS HER HUSBAND TRAVELS DURING THE WEEK AND HER CHILDREN ARE GROWN.  WILL F/U ON DC NEEDS.  09/27/2011 1430 Darlyne Russian RN [phone] Utilization review completed.

## 2011-09-29 NOTE — Discharge Instructions (Signed)
Pyelonephritis, Adult Pyelonephritis is a kidney infection. A kidney infection can happen quickly, or it can last for a long time. HOME CARE   Take your medicine (antibiotics) as told. Finish it even if you start to feel better.   Keep all doctor visits as told.   Drink enough fluids to keep your pee (urine) clear or pale yellow.   Only take medicine as told by your doctor.  GET HELP RIGHT AWAY IF:   You have a fever.   You cannot take your medicine or drink fluids as told.   You have chills and shaking.   You feel very weak or pass out (faint).   You do not feel better after 2 days.  MAKE SURE YOU:  Understand these instructions.   Will watch your condition.   Will get help right away if you are not doing well or get worse.  Document Released: 06/24/2004 Document Revised: 05/06/2011 Document Reviewed: 11/04/2010 Exit[employer] Patient Information 2012 [employer]. 

## 2011-09-29 NOTE — Discharge Summary (Signed)
Patient ID: Monique Hunt MRN: [id number] DOB/AGE: 12/17/1960 51 y.o.  Admit date: 09/26/2011 Discharge date: 09/29/2011  Primary Care Physician:  Lavonda Jumbo, MD, MD   Hospital Course:   Principal Problem:   *Pyelonephritis, acute - patient has received Cipro IV 400 mg daily for 3 days; patient was instructed to take Cipro PO BID for additional 10 days on discharge - prescriptions provided for norco; dilaudid for breakthrough pain; phenergan for nausea - surgery consulted for possible appendicitis- recommendation conservative management as pain is already improving and this is less likely appendicitis  Active Problems:   Hypokalemia - unclear etiology perhaps secondary to GI losses, vomiting - repleted and resolved at this time   Hyponatremia - secondary to dehydration - resolved at the time of discharge  Medication List  As of 09/29/2011 11:11 AM   STOP taking these medications         norethindrone-ethinyl estradiol 1-20 MG-MCG tablet         TAKE these medications         ciprofloxacin 500 MG tablet   Commonly known as: CIPRO   Take 1 tablet (500 mg total) by mouth 2 (two) times daily.      HYDROcodone-acetaminophen 5-325 MG per tablet   Commonly known as: NORCO   Take 1-2 tablets by mouth every 4 (four) hours as needed.      HYDROmorphone 4 MG tablet   Commonly known as: DILAUDID   Take 1 tablet (4 mg total) by mouth every 4 (four) hours as needed for pain (for breakthrough pain).      olopatadine 0.1 % ophthalmic solution   Commonly known as: PATANOL   Place 1 drop into both eyes 2 (two) times daily.      promethazine 50 MG tablet   Commonly known as: PHENERGAN   Take 1 tablet (50 mg total) by mouth every 6 (six) hours as needed for nausea.      SUMAtriptan 100 MG tablet   Commonly known as: IMITREX   Take 100 mg by mouth every 2 (two) hours as needed. For migraine            Disposition and Follow-up:  - patient is medically stable for  discharge home today  Consults:  1. Surgery  Significant Diagnostic Studies:  Ct Abdomen Pelvis Wo Contrast 09/27/2011  IMPRESSION:  1.  No explanation for patient's right-sided flank pain. Specifically, no evidence of right-sided urinary obstruction.  2.  Multiple bilateral punctate nonobstructing renal stones. 3.  Minimal amount of high-density material is seen within an otherwise normal-appearing appendix.  This finding is nonspecific but may be suggestive of early appendicolith formation.  4. Extensive colonic diverticulosis without evidence of diverticulitis on this noncontrast examination.    US Abdomen Complete 09/27/2011  IMPRESSION: Contracted gallbladder.  No gallstones or sonographic evidence for cholecystitis.  Bilateral nonobstructing renal stones.  No hydronephrosis.    Brief H and P: 51 year old female with history of recurrent kidney stones presented to ED with complaints or right- sided flank pain, 10/10 in intensity, radiating to groin area of the same side associated with some nausea and vomiting. Patient reported subjective fever at home. No complaints of blood in stool or urine. Patient had imaging studies done while in hospital which did reveal non-obstructing stones. I spoke with MD on-call for urology and recommendation was to increase fluid intake adn since the sones are non obstructed no intervention is required at this time.  Physical Exam on Discharge:  Filed  Vitals:   09/28/11 0606 09/28/11 1417 09/28/11 2121 09/29/11 0553  BP: 132/67 150/85 151/81 149/84  Pulse: 100 90 89 85  Temp: 99 F (37.2 C) 98.4 F (36.9 C) 98.3 F (36.8 C) 98.9 F (37.2 C)  TempSrc:      Resp: 20 18 18 20   Height:      Weight:      SpO2: 96% 99% 98% 97%     Intake/Output Summary (Last 24 hours) at 09/29/11 1111 Last data filed at 09/29/11 0900  Gross per 24 hour  Intake 2128.67 ml  Output      0 ml  Net 2128.67 ml    General: Alert, awake, oriented x3, in no acute  distress. HEENT: No bruits, no goiter. Heart: Regular rate and rhythm, without murmurs, rubs, gallops. Lungs: Clear to auscultation bilaterally. Abdomen: Soft, nontender, nondistended, positive bowel sounds. Extremities: No clubbing cyanosis or edema with positive pedal pulses. Neuro: Grossly intact, nonfocal.  CBC:    Component Value Date/Time   WBC 9.2 09/28/2011 0628   HGB 11.9* 09/28/2011 0628   HCT 35.3* 09/28/2011 0628   PLT 223 09/28/2011 0628   MCV 95.7 09/28/2011 0628   NEUTROABS 6.5 04/07/2011 0955   LYMPHSABS 0.9 04/07/2011 0955   MONOABS 0.5 04/07/2011 0955   EOSABS 0.0 04/07/2011 0955   BASOSABS 0.1 04/07/2011 0955    Basic Metabolic Panel:    Component Value Date/Time   NA 136 09/28/2011 0628   K 4.3 09/28/2011 0628   CL 104 09/28/2011 0628   CO2 24 09/28/2011 0628   BUN 3* 09/28/2011 0628   CREATININE 0.55 09/28/2011 0628   GLUCOSE 125* 09/28/2011 0628   CALCIUM 8.4 09/28/2011 0628    Time spent on Discharge: Greater than 45 minutes  Signed: Tariyah Pendry 09/29/2011, 11:11 AM

## 2011-10-04 LAB — CULTURE, BLOOD (ROUTINE X 2)
Culture  Setup Time: 201304300110
Culture: NO GROWTH

## 2011-11-16 ENCOUNTER — Encounter (HOSPITAL_COMMUNITY): Payer: Self-pay | Admitting: Emergency Medicine

## 2011-11-16 ENCOUNTER — Emergency Department (HOSPITAL_COMMUNITY): Payer: PRIVATE HEALTH INSURANCE

## 2011-11-16 ENCOUNTER — Emergency Department (HOSPITAL_COMMUNITY)
Admission: EM | Admit: 2011-11-16 | Discharge: 2011-11-16 | Disposition: A | Discharge status: Home / Self Care | Payer: PRIVATE HEALTH INSURANCE | Attending: Emergency Medicine | Admitting: Emergency Medicine

## 2011-11-16 DIAGNOSIS — S1093XA Contusion of unspecified part of neck, initial encounter: Secondary | ICD-10-CM | POA: Insufficient documentation

## 2011-11-16 DIAGNOSIS — R51 Headache: Secondary | ICD-10-CM | POA: Insufficient documentation

## 2011-11-16 DIAGNOSIS — Z87891 Personal history of nicotine dependence: Secondary | ICD-10-CM | POA: Insufficient documentation

## 2011-11-16 DIAGNOSIS — M545 Low back pain, unspecified: Secondary | ICD-10-CM | POA: Insufficient documentation

## 2011-11-16 DIAGNOSIS — S0003XA Contusion of scalp, initial encounter: Secondary | ICD-10-CM | POA: Insufficient documentation

## 2011-11-16 HISTORY — DX: Migraine, unspecified, not intractable, without status migrainosus: G43.909

## 2011-11-16 LAB — URINALYSIS, ROUTINE W REFLEX MICROSCOPIC
Bilirubin Urine: NEGATIVE
Nitrite: NEGATIVE
Specific Gravity, Urine: 1.023 (ref 1.005–1.030)
Urobilinogen, UA: 0.2 mg/dL (ref 0.0–1.0)

## 2011-11-16 LAB — URINE MICROSCOPIC-ADD ON

## 2011-11-16 MED ORDER — METOCLOPRAMIDE HCL 5 MG/ML IJ SOLN
10.0000 mg | Freq: Once | INTRAMUSCULAR | Status: AC
  Administered 2011-11-16: 10 mg via INTRAMUSCULAR
  Filled 2011-11-16: qty 2

## 2011-11-16 MED ORDER — SUMATRIPTAN SUCCINATE 100 MG PO TABS: 100.0000 mg | ORAL_TABLET | ORAL | Status: DC | PRN

## 2011-11-16 MED ORDER — SUMATRIPTAN SUCCINATE 6 MG/0.5ML ~~LOC~~ SOLN
6.0000 mg | Freq: Once | SUBCUTANEOUS | Status: AC
  Administered 2011-11-16: 6 mg via SUBCUTANEOUS
  Filled 2011-11-16: qty 0.5

## 2011-11-16 MED ORDER — DIPHENHYDRAMINE HCL 50 MG/ML IJ SOLN
25.0000 mg | Freq: Once | INTRAMUSCULAR | Status: AC
  Administered 2011-11-16: 25 mg via INTRAMUSCULAR
  Filled 2011-11-16: qty 1
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
## 2011-11-16 NOTE — ED Provider Notes (Signed)
2:00 PM Assumed care of patient in the CDU from [facility], PA-C.  Patient presented today with a headache.  She had a CT maxillofacial done, which did not show any acute abnormalities.  Patient reports that her headache is worse after receiving benadryl and reglan.  Patient just given a shot of Imitrex.  Will reassess shortly.  Patient alert and orientated x 3, CN II-XII grossly intact, normal gait, muscle strength of upper and lower extremities 5/5 bilaterally.   2:35 PM Patient reports that her headache has improved and is requesting to be discharged home.  Pascal Lux Biddle, PA-C 11/16/11 2124

## 2011-11-16 NOTE — ED Notes (Signed)
Patient transferred to CDU 03.
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
## 2011-11-16 NOTE — ED Notes (Signed)
Patient resting and continues to have headache. Patient's husband at bedside.

## 2011-11-16 NOTE — ED Notes (Signed)
Patient transported to CT 
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
## 2011-11-17 NOTE — ED Provider Notes (Signed)
Medical screening examination/treatment/procedure(s) were conducted as a shared visit with non-physician practitioner(s) and myself.  I personally evaluated the patient during the encounter  Cheri Guppy, MD 11/17/11 1535

## 2011-11-17 NOTE — ED Provider Notes (Signed)
Medical screening examination/treatment/procedure(s) were conducted as a shared visit with non-physician practitioner(s) and myself.  I personally evaluated the patient during the encounter  Gryphon Vanderveen, MD 11/17/11 1536 

## 2012-05-30 ENCOUNTER — Emergency Department (HOSPITAL_COMMUNITY)
Admission: EM | Admit: 2012-05-30 | Discharge: 2012-05-31 | Disposition: A | Discharge status: Other Acute Inpatient Hospital | Payer: 59 | Attending: Emergency Medicine | Admitting: Emergency Medicine

## 2012-05-30 DIAGNOSIS — R4589 Other symptoms and signs involving emotional state: Secondary | ICD-10-CM

## 2012-05-30 DIAGNOSIS — F3289 Other specified depressive episodes: Secondary | ICD-10-CM | POA: Insufficient documentation

## 2012-05-30 DIAGNOSIS — Z8679 Personal history of other diseases of the circulatory system: Secondary | ICD-10-CM | POA: Insufficient documentation

## 2012-05-30 DIAGNOSIS — F1510 Other stimulant abuse, uncomplicated: Secondary | ICD-10-CM | POA: Insufficient documentation

## 2012-05-30 DIAGNOSIS — F411 Generalized anxiety disorder: Secondary | ICD-10-CM | POA: Insufficient documentation

## 2012-05-30 DIAGNOSIS — R45851 Suicidal ideations: Secondary | ICD-10-CM | POA: Insufficient documentation

## 2012-05-30 DIAGNOSIS — Z79899 Other long term (current) drug therapy: Secondary | ICD-10-CM | POA: Insufficient documentation

## 2012-05-30 DIAGNOSIS — F319 Bipolar disorder, unspecified: Secondary | ICD-10-CM | POA: Insufficient documentation

## 2012-05-30 DIAGNOSIS — Z87442 Personal history of urinary calculi: Secondary | ICD-10-CM | POA: Insufficient documentation

## 2012-05-30 DIAGNOSIS — Z3202 Encounter for pregnancy test, result negative: Secondary | ICD-10-CM | POA: Insufficient documentation

## 2012-05-30 DIAGNOSIS — Z87891 Personal history of nicotine dependence: Secondary | ICD-10-CM | POA: Insufficient documentation

## 2012-05-30 DIAGNOSIS — Z0000 Encounter for general adult medical examination without abnormal findings: Secondary | ICD-10-CM | POA: Insufficient documentation

## 2012-05-30 DIAGNOSIS — F329 Major depressive disorder, single episode, unspecified: Secondary | ICD-10-CM | POA: Insufficient documentation

## 2012-05-30 LAB — COMPREHENSIVE METABOLIC PANEL
Alkaline Phosphatase: 88 U/L (ref 39–117)
BUN: 10 mg/dL (ref 6–23)
Chloride: 103 mEq/L (ref 96–112)
GFR calc Af Amer: >90 mL/min (ref >90)
GFR calc non Af Amer: >90 mL/min (ref >90)
Glucose, Bld: 107 mg/dL — ABNORMAL HIGH (ref 70–99)
Potassium: 3.5 mEq/L (ref 3.5–5.1)
Total Bilirubin: 0.4 mg/dL (ref 0.3–1.2)
Total Protein: 7.9 g/dL (ref 6.0–8.3)

## 2012-05-30 LAB — CBC
HCT: 40.1 % (ref 36.0–46.0)
Hemoglobin: 13.7 g/dL (ref 12.0–15.0)
MCHC: 34.2 g/dL (ref 30.0–36.0)

## 2012-05-30 LAB — PREGNANCY, URINE: Preg Test, Ur: NEGATIVE

## 2012-05-30 LAB — RAPID URINE DRUG SCREEN, HOSP PERFORMED: Barbiturates: NOT DETECTED

## 2012-05-30 LAB — ETHANOL: Alcohol, Ethyl (B): <11 mg/dL (ref 0–11)

## 2012-05-30 LAB — ACETAMINOPHEN LEVEL: Acetaminophen (Tylenol), Serum: <15 ug/mL (ref 10–30)

## 2012-05-30 MED ORDER — ZOLPIDEM TARTRATE 5 MG PO TABS: 5.0000 mg | ORAL_TABLET | Freq: Every evening | ORAL | Status: DC | PRN

## 2012-05-30 MED ORDER — LORAZEPAM 1 MG PO TABS
1.0000 mg | ORAL_TABLET | Freq: Three times a day (TID) | ORAL | Status: DC | PRN
  Administered 2012-05-30: 1 mg via ORAL
  Filled 2012-05-30: qty 1

## 2012-05-30 MED ORDER — ONDANSETRON HCL 4 MG PO TABS: 4.0000 mg | ORAL_TABLET | Freq: Three times a day (TID) | ORAL | Status: DC | PRN

## 2012-05-30 MED ORDER — IBUPROFEN 600 MG PO TABS: 600.0000 mg | ORAL_TABLET | Freq: Three times a day (TID) | ORAL | Status: DC | PRN

## 2012-05-30 MED ORDER — NICOTINE 21 MG/24HR TD PT24: 21.0000 mg | MEDICATED_PATCH | Freq: Every day | TRANSDERMAL | Status: DC

## 2012-05-30 MED ORDER — ACETAMINOPHEN 325 MG PO TABS: 650.0000 mg | ORAL_TABLET | ORAL | Status: DC | PRN

## 2012-05-30 NOTE — ED Notes (Signed)
1 bag locked in locker # [id number]
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
## 2012-05-31 ENCOUNTER — Encounter (HOSPITAL_COMMUNITY): Payer: Self-pay | Admitting: Behavioral Health

## 2012-05-31 ENCOUNTER — Inpatient Hospital Stay (HOSPITAL_COMMUNITY)
Admission: EM | Admit: 2012-05-31 | Discharge: 2012-06-06 | DRG: 881 | Disposition: A | Discharge status: Home / Self Care | Payer: 59 | Source: Ambulatory Visit | Attending: Psychiatry | Admitting: Psychiatry

## 2012-05-31 DIAGNOSIS — F1510 Other stimulant abuse, uncomplicated: Secondary | ICD-10-CM | POA: Diagnosis present

## 2012-05-31 DIAGNOSIS — F329 Major depressive disorder, single episode, unspecified: Secondary | ICD-10-CM

## 2012-05-31 DIAGNOSIS — F1994 Other psychoactive substance use, unspecified with psychoactive substance-induced mood disorder: Secondary | ICD-10-CM | POA: Diagnosis present

## 2012-05-31 DIAGNOSIS — F411 Generalized anxiety disorder: Secondary | ICD-10-CM | POA: Diagnosis present

## 2012-05-31 DIAGNOSIS — F3289 Other specified depressive episodes: Principal | ICD-10-CM | POA: Diagnosis present

## 2012-05-31 DIAGNOSIS — R45851 Suicidal ideations: Secondary | ICD-10-CM

## 2012-05-31 DIAGNOSIS — Z79899 Other long term (current) drug therapy: Secondary | ICD-10-CM

## 2012-05-31 MED ORDER — LORAZEPAM 0.5 MG PO TABS: 0.5000 mg | ORAL_TABLET | Freq: Two times a day (BID) | ORAL | Status: DC | PRN

## 2012-05-31 MED ORDER — ACETAMINOPHEN 325 MG PO TABS
650.0000 mg | ORAL_TABLET | Freq: Four times a day (QID) | ORAL | Status: DC | PRN
  Administered 2012-05-31 – 2012-06-03 (×6): 650 mg via ORAL

## 2012-05-31 MED ORDER — MAGNESIUM HYDROXIDE 400 MG/5ML PO SUSP: 30.0000 mL | Freq: Every day | ORAL | Status: DC | PRN

## 2012-05-31 MED ORDER — SUMATRIPTAN SUCCINATE 100 MG PO TABS: 100.0000 mg | ORAL_TABLET | ORAL | Status: DC | PRN

## 2012-05-31 MED ORDER — TRAZODONE HCL 50 MG PO TABS
50.0000 mg | ORAL_TABLET | Freq: Every evening | ORAL | Status: DC | PRN
  Administered 2012-06-01 – 2012-06-05 (×4): 50 mg via ORAL
  Filled 2012-05-31: qty 1
  Filled 2012-05-31: qty 6
  Filled 2012-05-31 (×3): qty 1

## 2012-05-31 MED ORDER — ALUM & MAG HYDROXIDE-SIMETH 200-200-20 MG/5ML PO SUSP: 30.0000 mL | ORAL | Status: DC | PRN

## 2012-05-31 MED ORDER — HYDROXYZINE HCL 25 MG PO TABS
25.0000 mg | ORAL_TABLET | Freq: Four times a day (QID) | ORAL | Status: DC | PRN
  Administered 2012-06-01 – 2012-06-04 (×4): 25 mg via ORAL

## 2012-05-31 MED ORDER — ACETAMINOPHEN 325 MG PO TABS: 650.0000 mg | ORAL_TABLET | Freq: Four times a day (QID) | ORAL | Status: DC | PRN

## 2012-05-31 MED ORDER — LORAZEPAM 0.5 MG PO TABS
0.5000 mg | ORAL_TABLET | Freq: Once | ORAL | Status: AC
  Administered 2012-05-31: 0.5 mg via ORAL
  Filled 2012-05-31: qty 1
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
## 2012-05-31 NOTE — [facility] Counselor (Signed)
Patient has been accepted at [facility] by Verne Spurr PA to the services of Dr. Daleen Bo, room 500.1

## 2012-05-31 NOTE — Tx Team (Signed)
Initial Interdisciplinary Treatment Plan  PATIENT STRENGTHS: (choose at least two) Average or above average intelligence Physical Health  PATIENT STRESSORS: Financial difficulties Loss of Husband* Marital or family conflict Medication change or noncompliance Occupational concerns   PROBLEM LIST: Problem List/Patient Goals Date to be addressed Date deferred Reason deferred Estimated date of resolution  Depression 06/01/2012     Suicidal Beahavior 06/01/2012     Anxiety 06/01/2012     Divorce/Grieving 06/01/2012                                    DISCHARGE CRITERIA:  Adequate post-discharge living arrangements Improved stabilization in mood, thinking, and/or behavior Motivation to continue treatment in a less acute level of care Need for constant or close observation no longer present Reduction of life-threatening or endangering symptoms to within safe limits Verbal commitment to aftercare and medication compliance  PRELIMINARY DISCHARGE PLAN: Outpatient therapy Placement in alternative living arrangements  PATIENT/FAMIILY INVOLVEMENT: This treatment plan has been presented to and reviewed with the patient, Monique Hunt, and/or family member.  The patient and family have been given the opportunity to ask questions and make suggestions.  Narvel Kozub Shari Prows 05/31/2012, 4:40 PM

## 2012-05-31 NOTE — ED Notes (Signed)
Discharged to [facility] 

## 2012-05-31 NOTE — ED Provider Notes (Signed)
Patient accepted to [facility] by Dr. Daleen Bo.  BP 120/72  Pulse 99  Temp 98.3 F (36.8 C) (Oral)  Resp 18  SpO2 100%   Glynn Octave, MD 05/31/12 1123

## 2012-05-31 NOTE — ED Notes (Signed)
EKG performed.

## 2012-06-01 DIAGNOSIS — F1910 Other psychoactive substance abuse, uncomplicated: Secondary | ICD-10-CM

## 2012-06-01 DIAGNOSIS — F341 Dysthymic disorder: Secondary | ICD-10-CM

## 2012-06-01 DIAGNOSIS — F1994 Other psychoactive substance use, unspecified with psychoactive substance-induced mood disorder: Secondary | ICD-10-CM

## 2012-06-01 MED ORDER — SERTRALINE HCL 25 MG PO TABS
25.0000 mg | ORAL_TABLET | Freq: Every day | ORAL | Status: DC
  Administered 2012-06-01 – 2012-06-02 (×2): 25 mg via ORAL
  Filled 2012-06-01 (×3): qty 1

## 2012-06-01 MED ORDER — HYDROXYZINE HCL 50 MG PO TABS
50.0000 mg | ORAL_TABLET | Freq: Once | ORAL | Status: AC
  Administered 2012-06-01: 50 mg via ORAL
  Filled 2012-06-01: qty 1

## 2012-06-01 NOTE — Progress Notes (Signed)
Psychoeducational Group Note  Date:  06/01/2012 Time:  1000  Group Topic/Focus:  Therapeutic Activity  Participation Level: Did Not Attend  Participation Quality:  Not Applicable  Affect:  Not Applicable  Cognitive:  Not Applicable  Insight:  Not Applicable  Engagement in Group: Not Applicable  Additional Comments: Patient did not attend group.  Karleen Hampshire Brittini 06/01/2012, 11:30 AM

## 2012-06-01 NOTE — [facility] Suicide Risk Assessment (Signed)
Suicide Risk Assessment  Admission Assessment     Nursing information obtained from:  Patient Demographic factors:  Caucasian;Unemployed;Low socioeconomic status;Access to firearms Current Mental Status:  Suicidal ideation indicated by patient Loss Factors:  Loss of significant relationship Historical Factors:  Prior suicide attempts Risk Reduction Factors:  Sense of responsibility to family  CLINICAL FACTORS:   Depression:   Anhedonia Hopelessness Impulsivity Severe  COGNITIVE FEATURES THAT CONTRIBUTE TO RISK:  Thought constriction (tunnel vision)    SUICIDE RISK:   Moderate:  Frequent suicidal ideation with limited intensity, and duration, some specificity in terms of plans, no associated intent, good self-control, limited dysphoria/symptomatology, some risk factors present, and identifiable protective factors, including available and accessible social support.  PLAN OF CARE: Ensure patient`s safety. Start medications as appropriate. Discharge when stable.   Mars Scheaffer 06/01/2012, 10:44 AM

## 2012-06-01 NOTE — Clinical Social Work Note (Signed)
[facility] LCSW Aftercare Discharge Planning Group Note       8:30-9:30 AM  1/2/201410:04 AM  Participation Quality:  Did not attend group   Artice Bergerson, Joesph July 06/01/2012 10:04 AM

## 2012-06-01 NOTE — Progress Notes (Signed)
Patient has been approached by staff, MHT and nurse this morning.  Patient will not open eyes or speak to staff.  Did not go to dining room for breakfast, did not allow VS to be taken this morning.   Patient's respirations are even and unlabored.  No signs of pain or distress noted on patient's face or body movements.  Will continue to monitor patient every 15 minutes for safety.

## 2012-06-01 NOTE — [facility] Counselor (Signed)
Adult Comprehensive Assessment  Patient ID: ELESIA PEMBERTON, female   DOB: 1961/03/12, 52 y.o.   MRN: [id number]  Information Source: Information source: Patient  Current Stressors:  Educational / Learning stressors: None Employment / Job issues: Unemployed for a year Family Relationships: None Surveyor, quantity / Lack of resources (include bankruptcy): Financial problems due to [employer] / Lack of housing: Homeless Physical health (include injuries & life threatening diseases): None reported Social relationships: None Substance abuse: Denies Bereavement / Loss: Denies  Living/Environment/Situation:  Living Arrangements: Non-relatives/Friends Living conditions (as described by patient or guardian): Patient reports a lot of people in the  home and she does not want to return to friend's home How long has patient lived in current situation?: A month What is atmosphere in current home: Chaotic  Family History:  Marital status: Separated Separated, when?: For a year a half  What types of issues is patient dealing with in the relationship?: Husband will not take her back Does patient have children?: Yes How many children?: 2  How is patient's relationship with their children?: Reports getting along well with adult sons  Childhood History:  By whom was/is the patient raised?: Both parents Additional childhood history information: reports having a good childhood Description of patient's relationship with caregiver when they were a child: good Patient's description of current relationship with people who raised him/her: She reports relationship is good but she would not ask them to allow her to live with them Does patient have siblings?: Yes Number of Siblings: 1  Description of patient's current relationship with siblings: good Did patient suffer any verbal/emotional/physical/sexual abuse as a child?: No Did patient suffer from severe childhood neglect?: No Has patient ever been  sexually abused/assaulted/raped as an adolescent or adult?: No Was the patient ever a victim of a crime or a disaster?: No Witnessed domestic violence?: No Has patient been effected by domestic violence as an adult?: No  Education:  Highest grade of school patient has completed: Automotive engineer Currently a Consulting civil engineer?: No Learning disability?: No  Employment/Work Situation:   Employment situation: Unemployed Patient's job has been impacted by current illness: No What is the longest time patient has a held a job?: Five years Where was the patient employed at that time?: [employer] Has patient ever been in the [employer]?: No Has patient ever served in Buyer, retail?: No  Financial Resources:   Surveyor, quantity resources: No income Does patient have a Lawyer or guardian?: No  Alcohol/Substance Abuse:   What has been your use of drugs/alcohol within the last 12 months?: Denies If attempted suicide, did drugs/alcohol play a role in this?: No Alcohol/Substance Abuse Treatment Hx: Denies past history Has alcohol/substance abuse ever caused legal problems?: No  Social Support System:   Forensic psychologist System: None Type of faith/religion: Ephriam Knuckles How does patient's faith help to cope with current illness?: Chief Operating Officer:   Leisure and Hobbies: Running  Strengths/Needs:   What things does the patient do well?: She does a good job when employed In what areas does patient struggle / problems for patient: Trusting people  Discharge Plan:   Does patient have access to transportation?: No Plan for no access to transportation at discharge: Patient depends on others for transportation Will patient be returning to same living situation after discharge?: No Plan for living situation after discharge: Patient is uncertain with whom she will live at discharge Currently receiving community mental health services: No If no, would patient like referral for services when  discharged?: Yes (What  county?) Medical sales representative) Does patient have financial barriers related to discharge medications?: Yes Patient description of barriers related to discharge medications: Patient has no source of income  Summary/Recommendations:  Leslea Vowles is a 52 year old Caucasian female admitted with Bipolar disorder.  She will benefit from crisis stabilization, evaluation for medication, psycho-education groups for coping skills development, group therapy and assistance with discharge planning.     Manases Etchison, Joesph July. 06/01/2012

## 2012-06-01 NOTE — Progress Notes (Addendum)
D:  Patient's self inventory sheet, patient sleeps well, poor appetite, low energy level, poor attention span.  Rated depression #10, hopelessness #7, anxiety #2.  Has been experiencing tremors.  Denied SI.  Has had headache in past 24 hours.  Zero pain goal, worst pain #8.  After discharge, plans to go back to husband, get a job.  Denied HI.  Denied A/V hallucinations.  Headache #8.  Does have discharge plans.  No problems buying meds after discharge. A:  Medications administered per MD order.  Support and encouragement given throughout day.  Support and safety checks completed as ordered. R:  Patient refuses to go to dining room and groups.  Denied SI and HI.  Denied A/V hallucinations.  Contracts for safety.  Patient remains safe on unit. Patient laying in bed this morning, would not open eyes or speak to staff.  Respirations even and unlabored.  No signs of pain or distress on patient's face or body movements.  Refused to go to dining room for breakfast or lunch.  Refused to go to groups.  Before lunch, patient used hall phone, crying and slammed phone on wall, walked to her room and sat on bed crying.  Would not discuss problem with nurse.  Noon medication given to patient, vistaril and tylenol administered for anxiety/headache.  Patient stated she was talking to her husband previously and wanted to call husband back.   Stated she was going back to live with her husband after discharge.  Encouraged patient to eat lunch and rest at that time.    1745  Patient's husband, Balbina Depace cell phone [phone], visited patient tonight.   Patient was very tearful, stated she wanted to go home asap.  Patient's husband would like callback to discuss patient's condition.  Patient would not go to dinner tonight, dinner will be brought to her.   Patient has not gone to any groups today, will not go to dining room for meals, only states she wants to go home asap.   74  Patient's husband visited and patient became  very upset, tearful, repeatedly stating she wanted husband to take her home.  Nurse called MD who ordered vistaril 50 mg po once for anxiety which was given to patient.  Patient sitting in treatment room with husband, eating dinner.  Patient's husband has visiting time schedule and will visit patient tomorrow.  Patient has become more calm at this time.  Will continue to monitor patient for safety on unit.

## 2012-06-01 NOTE — H&P (Signed)
Psychiatric Admission Assessment Adult  Patient Identification:  Monique Hunt Date of Evaluation:  06/01/2012 Chief Complaint:  Bipolar History of Present Illness: Pt is a 52 year old white female who reports that she is separated from her husband and living alone. Patient is IVC due to barricading herself in her home with a gun. Pt. reports that she has never received counseling or medication management in the past to address her feelings of depression. Throughout the session the patient was not able to stop crying. Patient reports frequent mood swing and thoughts of wanting to hurt herself due to her husband telling her that he wants a divorce. Throughout the session the patient was very guarded and she was not able to stay on the topic. Patient continues to describe suicidal ideations associated with not being abel to communicate with her husband. Patient denied past hospitalization, Patient denies substance abuse. However ALS was positive for Amphetamines. Patient's BAL was <11. Patient denies any HI.   Elements:  Location:  adult inpatient unit. Quality:  suicidal thoughts, depressed mood. Severity:  severe. Timing:  several weeks. Duration:  unknown. Context:  separated from husband. Associated Signs/Synptoms: Depression Symptoms:  depressed mood, psychomotor agitation, feelings of worthlessness/guilt, suicidal thoughts with specific plan, anxiety, insomnia, (Hypo) Manic Symptoms:  Distractibility, Anxiety Symptoms:  Excessive Worry, Psychotic Symptoms:  denies PTSD Symptoms: Negative  Psychiatric Specialty Exam: Physical Exam  ROS  Blood pressure 131/81, pulse 80, temperature 98.3 F (36.8 C), temperature source Oral, resp. rate 20, height 5\' 3"  (1.6 m), weight 59.875 kg (132 lb).Body mass index is 23.38 kg/(m^2).  General Appearance: Disheveled  Eye Contact::  Minimal  Speech:  Slow  Volume:  Decreased  Mood:  Depressed  Affect:  Depressed and Flat  Thought Process:   Circumstantial  Orientation:  Full (Time, Place, and Person)  Thought Content:  WDL  Suicidal Thoughts:  Yes.  with intent/plan  Homicidal Thoughts:  No  Memory:  Immediate;   Fair Recent;   Fair Remote;   Fair  Judgement:  Impaired  Insight:  Present  Psychomotor Activity:  Decreased  Concentration:  Fair  Recall:  Fair  Akathisia:  No  Handed:  Right  AIMS (if indicated):     Assets:  Communication Skills  Sleep:  Number of Hours: 6.25     Past Psychiatric History: Diagnosis:  Hospitalizations:  Outpatient Care:  Substance Abuse Care:  Self-Mutilation:  Suicidal Attempts:  Violent Behaviors:   Past Medical History:   Past Medical History  Diagnosis Date  . Kidney stone   . Kidney stones   . Migraine   . Migraine     Allergies:  No Known Allergies PTA Medications: Prescriptions prior to admission  Medication Sig Dispense Refill  . SUMAtriptan (IMITREX) 100 MG tablet Take 100 mg by mouth every 2 (two) hours as needed. For migraine        Previous Psychotropic Medications:  Medication/Dose    None per patient             Substance Abuse History in the last 12 months:  no  Consequences of Substance Abuse: Negative  Social History:  reports that she has quit smoking. She does not have any smokeless tobacco history on file. She reports that she drinks alcohol. She reports that she does not use illicit drugs. Additional Social History: History of alcohol / drug use?:  (Pt reports taking prescription Vyvance)  Current Place of Residence:   Place of Birth:   Family Members: Marital Status:  Separated Children:  Sons:  Daughters: Relationships: Education:   Educational Problems/Performance: Religious Beliefs/Practices: History of Abuse (Emotional/Phsycial/Sexual) Teacher, music History:  None. Legal History: Hobbies/Interests:  Family History:  History reviewed. No pertinent family  history.  Results for orders placed during the hospital encounter of 05/30/12 (from the past 72 hour(s))  ACETAMINOPHEN LEVEL     Status: Normal   Collection Time   05/30/12  6:30 PM      Component Value Range Comment   Acetaminophen (Tylenol), Serum <15.0  10 - 30 ug/mL   CBC     Status: Abnormal   Collection Time   05/30/12  6:30 PM      Component Value Range Comment   WBC 10.6 (*) 4.0 - 10.5 K/uL    RBC 4.29  3.87 - 5.11 MIL/uL    Hemoglobin 13.7  12.0 - 15.0 g/dL    HCT 40.9  81.1 - 91.4 %    MCV 93.5  78.0 - 100.0 fL    MCH 31.9  26.0 - 34.0 pg    MCHC 34.2  30.0 - 36.0 g/dL    RDW 78.2  95.6 - 21.3 %    Platelets 465 (*) 150 - 400 K/uL   COMPREHENSIVE METABOLIC PANEL     Status: Abnormal   Collection Time   05/30/12  6:30 PM      Component Value Range Comment   Sodium 140  135 - 145 mEq/L    Potassium 3.5  3.5 - 5.1 mEq/L    Chloride 103  96 - 112 mEq/L    CO2 23  19 - 32 mEq/L    Glucose, Bld 107 (*) 70 - 99 mg/dL    BUN 10  6 - 23 mg/dL    Creatinine, Ser 0.86  0.50 - 1.10 mg/dL    Calcium 57.8  8.4 - 10.5 mg/dL    Total Protein 7.9  6.0 - 8.3 g/dL    Albumin 4.3  3.5 - 5.2 g/dL    AST 33  0 - 37 U/L    ALT 45 (*) 0 - 35 U/L    Alkaline Phosphatase 88  39 - 117 U/L    Total Bilirubin 0.4  0.3 - 1.2 mg/dL    GFR calc non Af Amer >90  >90 mL/min    GFR calc Af Amer >90  >90 mL/min   ETHANOL     Status: Normal   Collection Time   05/30/12  6:30 PM      Component Value Range Comment   Alcohol, Ethyl (B) <11  0 - 11 mg/dL   SALICYLATE LEVEL     Status: Abnormal   Collection Time   05/30/12  6:30 PM      Component Value Range Comment   Salicylate Lvl <2.0 (*) 2.8 - 20.0 mg/dL   URINE RAPID DRUG SCREEN (HOSP PERFORMED)     Status: Abnormal   Collection Time   05/30/12  6:34 PM      Component Value Range Comment   Opiates NONE DETECTED  NONE DETECTED    Cocaine NONE DETECTED  NONE DETECTED    Benzodiazepines NONE DETECTED  NONE DETECTED    Amphetamines  POSITIVE (*) NONE DETECTED    Tetrahydrocannabinol NONE DETECTED  NONE DETECTED    Barbiturates NONE DETECTED  NONE DETECTED   PREGNANCY, URINE     Status: Normal   Collection  Time   05/30/12  8:07 PM      Component Value Range Comment   Preg Test, Ur NEGATIVE  NEGATIVE    Psychological Evaluations:  Assessment:   AXIS I:  Depressive Disorder NOS AXIS II:  Deferred AXIS III:   Past Medical History  Diagnosis Date  . Kidney stone   . Kidney stones   . Migraine   . Migraine    AXIS IV:  other psychosocial or environmental problems AXIS V:  41-50 serious symptoms  Treatment Plan/Recommendations:   Start antidepressant medication. Encourage patient to attend groups. Start planning for outpatient appointments.  Treatment Plan Summary: Daily contact with patient to assess and evaluate symptoms and progress in treatment Medication management Current Medications:  Current Facility-Administered Medications  Medication Dose Route Frequency Provider Last Rate Last Dose  . acetaminophen (TYLENOL) tablet 650 mg  650 mg Oral Q6H PRN Nanine Means, NP   650 mg at 06/01/12 0004  . alum & mag hydroxide-simeth (MAALOX/MYLANTA) 200-200-20 MG/5ML suspension 30 mL  30 mL Oral Q4H PRN Nanine Means, NP      . hydrOXYzine (ATARAX/VISTARIL) tablet 25 mg  25 mg Oral Q6H PRN Nanine Means, NP   25 mg at 06/01/12 0003  . magnesium hydroxide (MILK OF MAGNESIA) suspension 30 mL  30 mL Oral Daily PRN Nanine Means, NP      . SUMAtriptan (IMITREX) tablet 100 mg  100 mg Oral Q2H PRN Nanine Means, NP      . traZODone (DESYREL) tablet 50 mg  50 mg Oral QHS PRN Verne Spurr, PA-C   50 mg at 06/01/12 0003    Observation Level/Precautions:  15 minute checks  Laboratory:  Per admission orders  Psychotherapy:  Group therapy  Medications:  Start as needed.  Consultations:    Discharge Concerns:  Safety and stabilization  Estimated LOS:5 to 6 days  Other:     I certify that inpatient services furnished can  reasonably be expected to improve the patient's condition.   Lanay Zinda 1/2/201410:38 AM

## 2012-06-01 NOTE — Progress Notes (Signed)
Psychiatric Admission Assessment Adult  Patient Identification:  Monique Hunt Date of Evaluation:  06/01/2012 Chief Complaint:  Bipolar  Psychiatric Specialty Exam: Physical Exam  Review of Systems  Constitutional: Negative.   Eyes: Negative.   Respiratory: Negative.   Cardiovascular: Negative.   Gastrointestinal: Negative.   Genitourinary: Negative.   Musculoskeletal: Negative.   Skin: Negative.   Neurological: Positive for headaches.  Endo/Heme/Allergies: Negative.   Psychiatric/Behavioral: Positive for depression.   Encouraged patient to take acetaminophen for her headache, results pending (see nurse's note)

## 2012-06-01 NOTE — Progress Notes (Addendum)
Staff reports patient continues to be anxious and does not want to go to the dining area or participate in groups and would like medications for anxiety.  PLAN:  Reviewed provider notes. Will give Hydroxyzine 50 mg now dose for anxiety.

## 2012-06-01 NOTE — Progress Notes (Signed)
Patient did not attend the evening karaoke group.  Pt remained in her bed sleeping until 9:00. After nine pt was on the hallway phone.

## 2012-06-01 NOTE — Progress Notes (Signed)
Patient ID: Monique Hunt, female   DOB: 04-17-1961, 52 y.o.   MRN: [id number] D: Pt. Slept all evening, up requesting sleep meds. Pt. Affect sad, very soft spoken, denies SHI. Pt. Did smile when asked about visit with husband this evening. Pt. Supposed to be meeting husband for breakfast in am. A: Pt. Will be monitored q50min for safety. Staff encouraged karaoke. R: Pt. Is safe on the unit. Pt. Refused karaoke.

## 2012-06-01 NOTE — Clinical Social Work Note (Signed)
[facility] LCSW Group Therapy        Living a Balanced Life  1:15-2:30           06/01/2012 2:41 PM    Type of Therapy:  Group Therapy  Participation Level:  Appropriate  Participation Quality:  Patient did not attend group.   [name] 06/01/2012 2:41 PM

## 2012-06-01 NOTE — Progress Notes (Signed)
Patient ID: YALONDA SAMPLE, female   DOB: 03-21-1961, 52 y.o.   MRN: [id number] D: Pt. In bed all evening, jumps up from bed at this time startled, frightened, reporting that she is afraid and the her roommate keeps looking at her. A: Writer offered support by assuring her she was safe on the unit and letting her know that staff was monitoring her frequently. Writer also gave anti anxiety med. Staff will monitor q44min  For safety. R: Pt. Received information and took meds without incidence. Pt. Is safe on the unit.

## 2012-06-02 MED ORDER — IBUPROFEN 600 MG PO TABS
600.0000 mg | ORAL_TABLET | Freq: Four times a day (QID) | ORAL | Status: DC | PRN
  Administered 2012-06-02: 600 mg via ORAL
  Filled 2012-06-02: qty 1

## 2012-06-02 MED ORDER — SERTRALINE HCL 50 MG PO TABS
50.0000 mg | ORAL_TABLET | Freq: Every day | ORAL | Status: DC
  Administered 2012-06-03 – 2012-06-06 (×4): 50 mg via ORAL
  Filled 2012-06-02 (×5): qty 1

## 2012-06-02 NOTE — Progress Notes (Signed)
[facility] LCSW Group Therapy        Feelings Around Relapse        1:15-2:30 PM    06/02/2012 3:44 PM  Type of Therapy:  Group Therapy  Participation Level:  None  Participation Quality:  Inattentive  Affect:  Blunted, Depressed, Flat and Irritable  Cognitive:  Appropriate  Insight:  Poor  Engagement in Therapy:  Poor  Modes of Intervention:  Confrontation, Discussion, Problem-solving and Support  Summary of Progress/Problems:  Patient attended group but left early and refused to participate.  [name] 06/02/2012, 3:44 PM

## 2012-06-02 NOTE — Tx Team (Signed)
Interdisciplinary Treatment Plan Update (Adult)  Date:  06/02/2012  Time Reviewed:  10:01 AM   Progress in Treatment: Attending groups:   Yes   Participating in groups:  Yes Taking medication as prescribed:  Yes Tolerating medication:  Yes Family/Significant othe contact made: Contact to be made with family Patient understands diagnosis:  Yes Discussing patient identified problems/goals with staff: Yes Medical problems stabilized or resolved: Yes Denies suicidal/homicidal ideation:Yes Issues/concerns per patient self-inventory:  Other:   New problem(s) identified:  Reason for Continuation of Hospitalization: Anxiety Depression Medication stabilization Suicidal ideation  Interventions implemented related to continuation of hospitalization:  Medication Management; safety checks q 15 mins  Additional comments:  Estimated length of stay:  2-3 days  Discharge Plan:  Home with outpatient follow up  New goal(s):  Review of initial/current patient goals per problem list:    1.  Goal(s): Eliminate SI/other thoughts of self harm   Met:  No  Target date: d/c  As evidenced by: Patient will  no longer endorse SI/HI or other thoughts of self harm.    2.  Goal (s):Reduce depression/anxiety  Met: No  Target date: d/c  As evidenced by: Patient will rate symptoms at four or below    3.  Goal(s):.stabilize on meds   Met:  No  Target date: d/c  As evidenced by: Patient will report being stabilized on medications - less symptomatic    4.  Goal(s): Refer for outpatient follow up   Met:  No  Target date: d/c  As evidenced by: Follow up appointment will bescheduled    Attendees: Patient:   06/02/2012 10:01 AM  Physican:  Patrick North, MD 06/02/2012 10:01 AM  Nursing:    Berneice Heinrich, RN 06/02/2012 10:01 AM   Nursing:    06/02/2012 10:01 AM   Clinical Social Worker:  Juline Patch, LCSW 06/02/2012 10:01 AM   Other:  06/02/2012 10:01 AM   Other:  Patton Salles,  Clinical Social Worker, LCSW  06/02/2012 10:01 AM Other:        06/02/2012 10:01 AM

## 2012-06-02 NOTE — Progress Notes (Signed)
Psychoeducational Group Note  Date:  06/02/2012 Time:  1100  Group Topic/Focus:  Early Warning Signs:   The focus of this group is to help patients identify signs or symptoms they exhibit before slipping into an unhealthy state or crisis.  Participation Level: Did Not Attend  Participation Quality:  Not Applicable  Affect:  Not Applicable  Cognitive:  Not Applicable  Insight:  Not Applicable  Engagement in Group: Not Applicable  Additional Comments:  Patient did not attend group.  Karleen Hampshire Brittini 06/02/2012, 6:58 PM

## 2012-06-02 NOTE — Progress Notes (Signed)
[facility] MD Progress Note  06/02/2012 10:31 AM Monique Hunt  MRN:  [id number] Subjective:  PAtient seen today, lying in bed. Not getting up for meals. Complaining of a headache. Ruminating about relationship with husband. Not attending groups.  Diagnosis:   Axis I: Depressive Disorder NOS Axis II: Deferred Axis III:  Past Medical History  Diagnosis Date  . Kidney stone   . Kidney stones   . Migraine   . Migraine    Axis IV: other psychosocial or environmental problems Axis V: 41-50 serious symptoms  ADL's:  Impaired  Sleep: Fair  Appetite:  Poor   Psychiatric Specialty Exam: Review of Systems  Constitutional: Positive for malaise/fatigue.  Eyes: Positive for blurred vision.  Respiratory: Negative.   Cardiovascular: Negative.   Gastrointestinal: Negative.   Genitourinary: Negative.   Musculoskeletal: Negative.   Neurological: Positive for headaches.  Endo/Heme/Allergies: Negative.   Psychiatric/Behavioral: Positive for depression. The patient is nervous/anxious.     Blood pressure 149/89, pulse 103, temperature 98.4 F (36.9 C), temperature source Oral, resp. rate 16, height 5\' 3"  (1.6 m), weight 59.875 kg (132 lb).Body mass index is 23.38 kg/(m^2).  General Appearance: Disheveled  Eye Contact::  Poor  Speech:  Garbled  Volume:  Decreased  Mood:  Depressed and Hopeless  Affect:  Constricted and Depressed  Thought Process:  Circumstantial  Orientation:  Full (Time, Place, and Person)  Thought Content:  WDL  Suicidal Thoughts:  No  Homicidal Thoughts:  No  Memory:  Immediate;   Fair Recent;   Fair Remote;   Fair  Judgement:  Impaired  Insight:  Lacking  Psychomotor Activity:  Decreased  Concentration:  Fair  Recall:  Fair  Akathisia:  No  Handed:  Right  AIMS (if indicated):     Assets:  Social Support  Sleep:  Number of Hours: 6    Current Medications: Current Facility-Administered Medications  Medication Dose Route Frequency Provider Last Rate Last  Dose  . acetaminophen (TYLENOL) tablet 650 mg  650 mg Oral Q6H PRN Nanine Means, NP   650 mg at 06/01/12 1214  . alum & mag hydroxide-simeth (MAALOX/MYLANTA) 200-200-20 MG/5ML suspension 30 mL  30 mL Oral Q4H PRN Nanine Means, NP      . hydrOXYzine (ATARAX/VISTARIL) tablet 25 mg  25 mg Oral Q6H PRN Nanine Means, NP   25 mg at 06/01/12 2153  . magnesium hydroxide (MILK OF MAGNESIA) suspension 30 mL  30 mL Oral Daily PRN Nanine Means, NP      . sertraline (ZOLOFT) tablet 25 mg  25 mg Oral Daily Eliani Leclere, MD   25 mg at 06/02/12 0826  . SUMAtriptan (IMITREX) tablet 100 mg  100 mg Oral Q2H PRN Nanine Means, NP      . traZODone (DESYREL) tablet 50 mg  50 mg Oral QHS PRN Verne Spurr, PA-C   50 mg at 06/01/12 2153    Lab Results: No results found for this or any previous visit (from the past 48 hour(s)).  Physical Findings: AIMS: Facial and Oral Movements Muscles of Facial Expression: None, normal Lips and Perioral Area: None, normal Jaw: None, normal Tongue: None, normal,Extremity Movements Upper (arms, wrists, hands, fingers): None, normal Lower (legs, knees, ankles, toes): None, normal, Trunk Movements Neck, shoulders, hips: None, normal, Overall Severity Severity of abnormal movements (highest score from questions above): None, normal Incapacitation due to abnormal movements: None, normal Patient's awareness of abnormal movements (rate only patient's report): No Awareness, Dental Status Current problems with teeth and/or dentures?:  No Does patient usually wear dentures?: No  CIWA:  CIWA-Ar Total: 9  COWS:  COWS Total Score: 4   Treatment Plan Summary: Daily contact with patient to assess and evaluate symptoms and progress in treatment Medication management  Plan: Increase Zoloft to 50mg  po qd. Encourage patient to get out of bed and attend groups, go to the cafeteria for her meals. Contact husband to obtain collateral  After obtaining consent from patient.  Medical Decision  Making Problem Points:  Established problem, worsening (2), Review of last therapy session (1) and Review of psycho-social stressors (1) Data Points:  Review and summation of old records (2) Review of medication regiment & side effects (2) Review of new medications or change in dosage (2)  I certify that inpatient services furnished can reasonably be expected to improve the patient's condition.   Brodyn Depuy 06/02/2012, 10:31 AM

## 2012-06-02 NOTE — Progress Notes (Signed)
[facility] Group Notes:  (Counselor/Nursing/MHT/Case Management/Adjunct)  06/02/2012 10:12 PM  Type of Therapy:  Psychoeducational Skills  Participation Level:  Active  Participation Quality:  Appropriate  Affect:  Appropriate  Cognitive:  Lacking  Insight:  Lacking  Engagement in Group:  Poor  Engagement in Therapy:  Poor  Modes of Intervention:  Education  Summary of Progress/Problems: Patient states that she had a good day but would not provide any details. Her goal for tomorrow is to "attend more groups".    Hazle Coca S 06/02/2012, 10:12 PM

## 2012-06-02 NOTE — Clinical Social Work Note (Signed)
[facility] LCSW Aftercare Discharge Planning Group Note       8:30-9:30 AM  1/3/201410:12 AM  Participation Quality:  Patient did not attend group.   [name] 06/02/2012 10:12 AM

## 2012-06-02 NOTE — Progress Notes (Signed)
[facility] INPATIENT:  Family/Significant Other Suicide Prevention Education  Suicide Prevention Education:  Contact Attempts: Monique Hunt, Husband [phone] been identified by the patient as the family member/significant other with whom the patient will be residing, and identified as the person(s) who will aid the patient in the event of a mental health crisis.  With written consent from the patient, two attempts were made to provide suicide prevention education, prior to and/or following the patient's discharge.  We were unsuccessful in providing suicide prevention education.  A suicide education pamphlet was given to the patient to share with family/significant other.  Date and time of first attempt: 06/02/12 at 1:00 PM   Monique Hunt 06/02/2012, 1:06 PM

## 2012-06-02 NOTE — Progress Notes (Signed)
[facility] INPATIENT:  Family/Significant Other Suicide Prevention Education  Suicide Prevention Education:  Education Completed; Monique Hunt, husband, [phone] has been identified by the patient as the family member/significant other with whom the patient will be residing, and identified as the person(s) who will aid the patient in the event of a mental health crisis (suicidal ideations/suicide attempt).  With written consent from the patient, the family member/significant other has been provided the following suicide prevention education, prior to the and/or following the discharge of the patient.  The suicide prevention education provided includes the following:  Suicide risk factors  Suicide prevention and interventions  National Suicide Hotline telephone number  [facility] assessment telephone number  [facility] Emergency Assistance 911  [facility] and/or Residential Mobile Crisis Unit telephone number  Request made of family/significant other to:  Remove weapons (e.g., guns, rifles, knives), all items previously/currently identified as safety concern.  Husband reports there are no guns in the home.  Remove drugs/medications (over-the-counter, prescriptions, illicit drugs), all items previously/currently identified as a safety concern.  The family member/significant other verbalizes understanding of the suicide prevention education information provided.  The family member/significant other agrees to remove the items of safety concern listed above.  Monique Hunt 06/02/2012, 1:18 PM

## 2012-06-02 NOTE — Progress Notes (Signed)
Pt stated in dining room this morning to visitor "Im sorry Im such a burden. Just throw my things away, I wont be needing them." This statement was relayed to this Clinical research associate from previous tech on [address]. Information was relayed to pts RN.

## 2012-06-02 NOTE — Progress Notes (Signed)
D) Pt has spent most of the day in her bed. Has been complaining of a headache which was treated with Tylenol this morning. Also complaining of a sore throat. An order was written that Pt had to go to meals. Pt was informed of this.  A) Pt was encouraged to come out of her room on several times. She was able to go the cafeteria for her meals and met her husband there. Asked one of the NP's to look at Pt and evaluate her headache. Mortin 600mg  was ordered for Pt. Given support, reassurance and appropriate praise. R) Pt denies SI and HI. Left the room to eat her meals and to go to one group which she couldn't stay for.

## 2012-06-03 DIAGNOSIS — F329 Major depressive disorder, single episode, unspecified: Principal | ICD-10-CM

## 2012-06-03 DIAGNOSIS — F411 Generalized anxiety disorder: Secondary | ICD-10-CM

## 2012-06-03 NOTE — Progress Notes (Signed)
Patient ID: Monique Hunt, female   DOB: Feb 05, 1961, 52 y.o.   MRN: [id number]  D: Pt has orders to go to all meals.Marland KitchenMarland KitchenShe can not have meals in her room. Pt denies SI/HI/AVH. Pt is  Cooperative. Pt says she had a good day. Pt complains of eye and HA 6 out of 10   A: Pt was offered support and encouragement. Pt was given scheduled medications. Pt was encourage to attend groups. Q 15 minute checks were done for safety. Pt given ice-pack for eye and HA   R:Pt attends groups and interacts  with peers and staff. Pt is taking medication Pt receptive to treatment and safety maintained on unit.

## 2012-06-03 NOTE — Progress Notes (Signed)
[facility] MD Progress Note  06/03/2012 12:10 PM Monique Hunt  MRN:  [id number] Subjective:  2/10 anxiety Diagnosis:   Axis I: Anxiety Disorder NOS and Depressive Disorder NOS Axis II: Deferred Axis III:  Past Medical History  Diagnosis Date  . Kidney stone   . Kidney stones   . Migraine   . Migraine    Axis IV: economic problems, housing problems, occupational problems, other psychosocial or environmental problems, problems related to social environment and problems with primary support group Axis V: 41-50 serious symptoms  ADL's:  Intact  Sleep: Fair  Appetite:  Fair  Suicidal Ideation:  Denies Homicidal Ideation:  Denies  Psychiatric Specialty Exam: Review of Systems  Constitutional: Negative.   HENT: Negative.   Eyes: Negative.   Respiratory: Negative.   Cardiovascular: Negative.   Gastrointestinal: Negative.   Genitourinary: Negative.   Musculoskeletal: Positive for back pain.  Skin: Negative.   Neurological: Negative.   Endo/Heme/Allergies: Negative.   Psychiatric/Behavioral: The patient is nervous/anxious.     Blood pressure 160/113, pulse 88, temperature 98.2 F (36.8 C), temperature source Oral, resp. rate 20, height 5\' 3"  (1.6 m), weight 59.875 kg (132 lb).Body mass index is 23.38 kg/(m^2).  General Appearance: Casual  Eye Contact::  Fair  Speech:  Normal Rate  Volume:  Normal  Mood:  Anxious  Affect:  Congruent  Thought Process:  Coherent  Orientation:  Full (Time, Place, and Person)  Thought Content:  WDL  Suicidal Thoughts:  No  Homicidal Thoughts:  No  Memory:  Immediate;   Fair Recent;   Poor Remote;   Fair  Judgement:  Impaired  Insight:  Lacking  Psychomotor Activity:  Decreased  Concentration:  Fair  Recall:  Fair  Akathisia:  No  Handed:  Right  AIMS (if indicated):     Assets:  Resilience  Sleep:  Number of Hours: 6.25    Current Medications: Current Facility-Administered Medications  Medication Dose Route Frequency Provider  Last Rate Last Dose  . acetaminophen (TYLENOL) tablet 650 mg  650 mg Oral Q6H PRN Nanine Means, NP   650 mg at 06/02/12 1112  . alum & mag hydroxide-simeth (MAALOX/MYLANTA) 200-200-20 MG/5ML suspension 30 mL  30 mL Oral Q4H PRN Nanine Means, NP      . hydrOXYzine (ATARAX/VISTARIL) tablet 25 mg  25 mg Oral Q6H PRN Nanine Means, NP   25 mg at 06/01/12 2153  . ibuprofen (ADVIL,MOTRIN) tablet 600 mg  600 mg Oral Q6H PRN Sanjuana Kava, NP   600 mg at 06/02/12 1626  . magnesium hydroxide (MILK OF MAGNESIA) suspension 30 mL  30 mL Oral Daily PRN Nanine Means, NP      . sertraline (ZOLOFT) tablet 50 mg  50 mg Oral Daily Himabindu Ravi, MD   50 mg at 06/03/12 0911  . SUMAtriptan (IMITREX) tablet 100 mg  100 mg Oral Q2H PRN Nanine Means, NP      . traZODone (DESYREL) tablet 50 mg  50 mg Oral QHS PRN Verne Spurr, PA-C   50 mg at 06/01/12 2153    Lab Results: No results found for this or any previous visit (from the past 48 hour(s)).  Physical Findings: AIMS: Facial and Oral Movements Muscles of Facial Expression: None, normal Lips and Perioral Area: None, normal Jaw: None, normal Tongue: None, normal,Extremity Movements Upper (arms, wrists, hands, fingers): None, normal Lower (legs, knees, ankles, toes): None, normal, Trunk Movements Neck, shoulders, hips: None, normal, Overall Severity Severity of abnormal movements (highest score from  questions above): None, normal Incapacitation due to abnormal movements: None, normal Patient's awareness of abnormal movements (rate only patient's report): No Awareness, Dental Status Current problems with teeth and/or dentures?: No Does patient usually wear dentures?: No  CIWA:  CIWA-Ar Total: 9  COWS:  COWS Total Score: 4   Treatment Plan Summary: Daily contact with patient to assess and evaluate symptoms and progress in treatment Medication management  Plan:  Review of chart, vital signs, medications, and notes. 1-Individual and group  therapy 2-Medication management for depression and anxiety:  Medications reviewed with the patient and no adjustments made.  The patient denies depression and admits a low level of anxiety but her behavioral assessment is incongruent:  Lying in bed before lunch, much encouragement needed to participate, focused on discharge but little insight into her depression 3-Coping skills for depression and anxiety 4-Continue crisis stabilization and management 5-Address health issues--ordered a urine analysis due to patient stating low back pain similar to when she gets urinary infections 6-Treatment plan in progress to prevent relapse of depression and anxiety  Medical Decision Making Problem Points:  Established problem, stable/improving (1) and Review of psycho-social stressors (1) Data Points:  Review or order clinical lab tests (1) Review of medication regiment & side effects (2)  I certify that inpatient services furnished can reasonably be expected to improve the patient's condition.   Nanine Means, PMH-NP 06/03/2012, 12:10 PM

## 2012-06-03 NOTE — Progress Notes (Signed)
Psychoeducational Group Note  Date:  06/03/2012 Time:  1015  Group Topic/Focus:  Identifying Needs:   The focus of this group is to help patients identify their personal needs that have been historically problematic and identify healthy behaviors to address their needs.  Participation Level: Did Not Attend  Participation Quality:  Not Applicable  Affect:  Not Applicable  Cognitive:  Not Applicable  Insight:  Not Applicable  Engagement in Group: Not Applicable  Additional Comments:   Rich Brave 06/03/2012, 7:19 PM

## 2012-06-03 NOTE — Clinical Social Work Psychosocial (Signed)
[facility] Group Notes:  (Clinical Social Work)  06/03/2012   3:00-4:00PM  Summary of Progress/Problems:   The main focus of today's process group was for the patient to identify ways in which they have in the past sabotaged their own recovery and reasons they may have done this/what they received from doing it.  We then worked to identify a specific plan to avoid doing this when discharged from the hospital for this admission.  The group was very uncommunicative today, with a lot of people who had been through the same class last week.  CSW asked if there were other things they wanted to discuss, and there was a brief discussion about people who were afraid of leaving the hospital, and those who are excited to leave.  The patient expressed that she is shy and does not feel comfortable talking, refused to do so even when given several moments of silence several times during group.  The only thing she did share with the group is that she is excited about leaving the hospital because she is getting a new cell phone.  Type of Therapy:  Group Therapy - Process  Participation Level:  Minimal  Participation Quality:  Attentive  Affect:  Anxious and Flat  Cognitive:  Oriented  Insight:  Lacking  Engagement in Therapy:  Lacking  Modes of Intervention:  Clarification, Education, Limit-setting, Problem-solving, Socialization, Support and Processing, Exploration, Discussion   Ambrose Mantle, LCSW 06/03/2012, 4:48 PM

## 2012-06-04 DIAGNOSIS — F329 Major depressive disorder, single episode, unspecified: Secondary | ICD-10-CM | POA: Diagnosis present

## 2012-06-04 MED ORDER — GUAIFENESIN ER 600 MG PO TB12
600.0000 mg | ORAL_TABLET | Freq: Once | ORAL | Status: AC
  Administered 2012-06-04: 600 mg via ORAL
  Filled 2012-06-04: qty 1

## 2012-06-04 MED ORDER — HYDROCHLOROTHIAZIDE 25 MG PO TABS
25.0000 mg | ORAL_TABLET | Freq: Every day | ORAL | Status: DC
  Administered 2012-06-04 – 2012-06-05 (×2): 25 mg via ORAL
  Filled 2012-06-04 (×6): qty 1

## 2012-06-04 MED ORDER — GUAIFENESIN ER 600 MG PO TB12
600.0000 mg | ORAL_TABLET | Freq: Two times a day (BID) | ORAL | Status: DC
  Administered 2012-06-04 – 2012-06-06 (×4): 600 mg via ORAL
  Filled 2012-06-04 (×7): qty 1

## 2012-06-04 NOTE — Progress Notes (Signed)
[facility] MD Progress Note  06/04/2012 3:29 PM Monique Hunt  MRN:  [id number] Subjective:  Cough Diagnosis:   Axis I: Depressive Disorder NOS Axis II: Deferred Axis III:  Past Medical History  Diagnosis Date  . Kidney stone   . Kidney stones   . Migraine   . Migraine    Axis IV: other psychosocial or environmental problems, problems related to social environment and problems with primary support group Axis V: 41-50 serious symptoms  ADL's:  Intact  Sleep: Fair  Appetite:  Good  Suicidal Ideation:  Denies Homicidal Ideation:  Denies  Psychiatric Specialty Exam: Review of Systems  Constitutional: Negative.   HENT: Negative.   Eyes: Negative.   Respiratory: Negative.   Cardiovascular: Negative.   Gastrointestinal: Negative.   Genitourinary: Negative.   Musculoskeletal: Negative.   Skin: Negative.   Neurological: Negative.   Endo/Heme/Allergies: Negative.   Psychiatric/Behavioral: Positive for depression.    Blood pressure 143/93, pulse 120, temperature 97.8 F (36.6 C), temperature source Oral, resp. rate 18, height 5\' 3"  (1.6 m), weight 59.875 kg (132 lb).Body mass index is 23.38 kg/(m^2).  General Appearance: Casual  Eye Contact::  Fair  Speech:  Normal Rate  Volume:  Normal  Mood:  Euthymic  Affect:  Congruent  Thought Process:  Coherent  Orientation:  Full (Time, Place, and Person)  Thought Content:  WDL  Suicidal Thoughts:  No  Homicidal Thoughts:  No  Memory:  Immediate;   Fair Recent;   Fair Remote;   Fair  Judgement:  Fair  Insight:  Fair  Psychomotor Activity:  Normal  Concentration:  Fair  Recall:  Fair  Akathisia:  No  Handed:  Right  AIMS (if indicated):     Assets:  Communication Skills Desire for Improvement  Sleep:  Number of Hours: 5.25    Current Medications: Current Facility-Administered Medications  Medication Dose Route Frequency Provider Last Rate Last Dose  . acetaminophen (TYLENOL) tablet 650 mg  650 mg Oral Q6H PRN Nanine Means, NP   650 mg at 06/03/12 2008  . alum & mag hydroxide-simeth (MAALOX/MYLANTA) 200-200-20 MG/5ML suspension 30 mL  30 mL Oral Q4H PRN Nanine Means, NP      . guaiFENesin (MUCINEX) 12 hr tablet 600 mg  600 mg Oral BID Nanine Means, NP      . hydrOXYzine (ATARAX/VISTARIL) tablet 25 mg  25 mg Oral Q6H PRN Nanine Means, NP   25 mg at 06/04/12 0035  . ibuprofen (ADVIL,MOTRIN) tablet 600 mg  600 mg Oral Q6H PRN Sanjuana Kava, NP   600 mg at 06/02/12 1626  . magnesium hydroxide (MILK OF MAGNESIA) suspension 30 mL  30 mL Oral Daily PRN Nanine Means, NP      . sertraline (ZOLOFT) tablet 50 mg  50 mg Oral Daily Himabindu Ravi, MD   50 mg at 06/04/12 0900  . SUMAtriptan (IMITREX) tablet 100 mg  100 mg Oral Q2H PRN Nanine Means, NP      . traZODone (DESYREL) tablet 50 mg  50 mg Oral QHS PRN Verne Spurr, PA-C   50 mg at 06/04/12 4403    Lab Results: No results found for this or any previous visit (from the past 48 hour(s)).  Physical Findings: AIMS: Facial and Oral Movements Muscles of Facial Expression: None, normal Lips and Perioral Area: None, normal Jaw: None, normal Tongue: None, normal,Extremity Movements Upper (arms, wrists, hands, fingers): None, normal Lower (legs, knees, ankles, toes): None, normal, Trunk Movements Neck, shoulders, hips: None,  normal, Overall Severity Severity of abnormal movements (highest score from questions above): None, normal Incapacitation due to abnormal movements: None, normal Patient's awareness of abnormal movements (rate only patient's report): No Awareness, Dental Status Current problems with teeth and/or dentures?: No Does patient usually wear dentures?: No  CIWA:  CIWA-Ar Total: 9  COWS:  COWS Total Score: 4   Treatment Plan Summary: Daily contact with patient to assess and evaluate symptoms and progress in treatment Medication management  Plan:  Review of chart, vital signs, medications, and notes. 1-Individual and group therapy 2-Medication  management for depression:  Medications reviewed with the patient and Mucinex ordered for her cough 3-Coping skills for depression, anxiety 4-Continue crisis stabilization and management 5-Supportive environment to optimize care 6-Treatment plan in progress to prevent relapse of depression    Medical Decision Making Problem Points:  Established problem, stable/improving (1) and Review of psycho-social stressors (1) Data Points:  Review of medication regiment & side effects (2)  I certify that inpatient services furnished can reasonably be expected to improve the patient's condition.   Nanine Means, PMH-NP 06/04/2012, 3:29 PM

## 2012-06-04 NOTE — Progress Notes (Signed)
[facility] Group Notes:  (Counselor/Nursing/MHT/Case Management/Adjunct)  06/04/2012 12:24 AM  Type of Therapy:  Psychoeducational Skills  Participation Level:  Minimal  Participation Quality:  Attentive  Affect:  Depressed  Cognitive:  Appropriate  Insight:  Developing/Improving  Engagement in Group:  Lacking  Engagement in Therapy:  Lacking  Modes of Intervention:  Education  Summary of Progress/Problems: The patient had nothing to share about her day. Her goal for tomorrow is to "go to meetings".   Monique Hunt 06/04/2012, 12:24 AM

## 2012-06-04 NOTE — Progress Notes (Signed)
Agree 

## 2012-06-04 NOTE — Clinical Social Work Psychosocial (Signed)
[facility] Group Notes:  (Clinical Social Work)  06/04/2012   3:00-4:00PM  Summary of Progress/Problems:   Summary of Progress/Problems:   The main focus of today's process group was for the patient to define "support" and describe what healthy supports are, then to identify the patient's current support system and decide on other supports that can be put in place to prevent future hospitalizations.  Roleplay was used to demonstrate definitions of different types of available supports.  An emphasis was placed on using therapist, doctor, therapy groups, self-help groups and problem-specific support groups to expand supports. Additionally, psychoeducation on various symptoms of mental illness was done at patients' requests.  The patient expressed support of other patients as they talked, was able to discuss how she could relate to their statements.  Type of Therapy:  Process Group  Participation Level:  Active  Participation Quality:  Appropriate, Attentive, Sharing and Supportive  Affect:  Blunted  Cognitive:  Alert, Appropriate and Oriented  Insight:  Engaged  Engagement in Therapy:  Engaged  Modes of Intervention:  Clarification, Education, Limit-setting, Problem-solving, Socialization, Support and Processing, Exploration, Discussion, Role-Play   Ambrose Mantle, LCSW 06/04/2012, 4:16 PM

## 2012-06-04 NOTE — Progress Notes (Signed)
Monique Hunt is seen out in the milieu.She remains sad, depressed and quiet.She  Says little, but attends her groups, is attentive to the group discussion and asks appropriate questions, trying to learn healthier ways to get her needs met.  A She completed her self inventory and on it she wrote  She denied feeling SI within the past 24 hrs, she didn't rate her depression or hopelessness and she stated her DC plan is to " get a job".  R Safety is in place and pOC includes beginning guafenesin for cold symptoms.

## 2012-06-04 NOTE — Progress Notes (Signed)
[facility] Group Notes:  (Counselor/Nursing/MHT/Case Management/Adjunct)  06/04/2012 11:43 PM  Type of Therapy:  Psychoeducational Skills  Participation Level:  Minimal  Participation Quality:  Attentive  Affect:  Appropriate  Cognitive:  Appropriate  Insight:  Engaged  Engagement in Group:  Lacking  Engagement in Therapy:  Limited  Modes of Intervention:  Education  Summary of Progress/Problems: The patient verbalized in group this evening that she enjoyed the groups that were held earlier in the day. Her goal for tomorrow is to "keep learning".    Monique Hunt S 06/04/2012, 11:43 PM

## 2012-06-04 NOTE — Progress Notes (Signed)
Psychoeducational Group Note  Date:  06/04/2012 Time:  1315  Group Topic/Focus:  Conflict Resolution:   The focus of this group is to discuss the conflict resolution process and how it may be used upon discharge.  Participation Level:  Minimal  Participation Quality:  Appropriate and Sharing  Affect:  Appropriate and Flat  Cognitive:  Appropriate  Insight:  Improving  Engagement in Group:  Limited    Dalia Heading 06/04/2012, 3:57 PM

## 2012-06-04 NOTE — Progress Notes (Signed)
D: Pt resting in bed much of shift, eyes closed, respirations even and unlabored.   Uninterested in interaction with staff or peers, eye contact poor.  A: Given Vistaril 25mg  for anxiety and Trazodone 50mg  PO @0035 .  Looks exhausted.  Will continue to monitor. PT MUST GO TO ALL MEALS WITH PEERS TO CAFETERIA.  R: In no apparent acute distress.  Safety maintained. Dion Saucier RN

## 2012-06-05 NOTE — Progress Notes (Signed)
Psychoeducational Group Note  Date:  06/05/2012 Time:  2015   Group Topic/Focus:  Wrap-up  Participation Level:  Minimal  Participation Quality:  Appropriate  Affect:  Appropriate  Cognitive:  Appropriate  Insight:  Improving  Engagement in Group:  Limited  Additional Comments:    Humberto Seals Monique 06/05/2012, 10:28 PM

## 2012-06-05 NOTE — Progress Notes (Signed)
Patient ID: Monique Hunt, female   DOB: [birth year], 52 y.o.   MRN: [id number] D- Patient reports she slept well and her appetite is good.  She denies feeling depressed or hopelessness.  She denies any thoughts of self harm. A- supported patienty, took pulse manually.  It is 130.  Patient started on HCTZ yesterday.  Talked with patient about diuretic effect.  She is drinking fluids.  She says she plans to get a job and keep going to therapy after d/c.  She also says she plans to continue learning and growing everyday.

## 2012-06-05 NOTE — Progress Notes (Signed)
Patient ID: Monique Hunt, female   DOB: [birth year], 52 y.o.   MRN: [id number] D: Pt. In room reports she dropped her contact, but did find it. Pt. Smiling, says being in group has taught her how to cope with things. Pt. Reports she has good support system in mom, dad, bro. And husband. Pt. Reports that she and husband are going to stay together. Pt. Reports even if they dong' she has her family. A: Pt. Will be monitored q45min for safety. Writer encouraged pt. To stay positive and use coping skills even if things to do not turn out the way she desires. Writer discuss suicidal risk plan. Pt. Reports Quylle, CM had reviewed the plan with her "she's very good, I like Quylle". R: Pt. Is safe on the unit. Pt. Attended group. Pt. Interacts with room mate.

## 2012-06-05 NOTE — Tx Team (Signed)
Interdisciplinary Treatment Plan Update (Adult)  Date:  06/05/2012  Time Reviewed:  9:59 AM   Progress in Treatment: Attending groups:   Yes   Participating in groups:  Yes Taking medication as prescribed:  Yes Tolerating medication:  Yes Family/Significant othe contact made: Contact made with family Patient understands diagnosis:  Yes Discussing patient identified problems/goals with staff: Yes Medical problems stabilized or resolved: Yes Denies suicidal/homicidal ideation: No Issues/concerns per patient self-inventory:  Other:   New problem(s) identified:  Reason for Continuation of Hospitalization: Anxiety Depression Medication stabilization   Interventions implemented related to continuation of hospitalization:  Medication Management; safety checks q 15 mins  Additional comments:  Estimated length of stay:  2-3 days  Discharge Plan:  Home with outpatient follow up  New goal(s):  Review of initial/current patient goals per problem list:    1.  Goal(s): Eliminate SI/other thoughts of self harm   Met:  Yes  Target date: d/c  As evidenced by: Patient no longer endorsing SI/HI or other thoughts of self harm.    2.  Goal (s):Reduce depression/anxiety (rated at one today)  Met: Yes  Target date: d/c  As evidenced by: Patient currently rating symptoms at four or below    3.  Goal(s):.stabilize on meds   Met:  Yes  Target date: d/c  As evidenced by: Patient will report being stabilized on medications - less symptomatic    4.  Goal(s): Refer for outpatient follow up   Met:  No  Target date: d/c  As evidenced by: Follow up appointment to be scheduled    Attendees: Patient:   06/05/2012 9:59 AM  Physican:  Patrick North, MD 06/05/2012 9:59 AM  Nursing:  Neill Loft , RN 06/05/2012 9:59 AM   Nursing:  Quintella Reichert , RN 06/05/2012 9:59 AM   Clinical Social Worker:  Juline Patch, LCSW 06/05/2012 9:59 AM   Other: Tera Helper, PHM-NP 06/05/2012  9:59 AM   Other:  Dellia Cloud, RN     06/05/2012 9:59 AM Other:        06/05/2012 9:59 AM

## 2012-06-05 NOTE — Progress Notes (Signed)
I agree with this note. Naliah Eddington, MD, MSPH 

## 2012-06-05 NOTE — Progress Notes (Signed)
[facility] MD Progress Note  06/05/2012 11:46 AM Monique Hunt  MRN:  [id number] Subjective:  Patient reports doing better. States her mood is better. She reports today that she took Vyvanse 70mg  every 4 hours the day she had the episode of holding the gun to her head. States she was not thinking clearly. Currently relationship with husband is better.  Diagnosis:   Axis I: Substance Induced Mood Disorder Axis II: Deferred Axis III:  Past Medical History  Diagnosis Date  . Kidney stone   . Kidney stones   . Migraine   . Migraine    Axis IV: other psychosocial or environmental problems Axis V: 51-60 moderate symptoms  ADL's:  Intact  Sleep: Fair  Appetite:  Fair  Psychiatric Specialty Exam: Review of Systems  Constitutional: Negative.   HENT: Negative.   Eyes: Negative.   Respiratory: Negative.   Cardiovascular: Negative.   Gastrointestinal: Negative.   Genitourinary: Negative.   Musculoskeletal: Negative.   Skin: Negative.   Neurological: Negative.   Endo/Heme/Allergies: Negative.   Psychiatric/Behavioral: Positive for depression. The patient is nervous/anxious.     Blood pressure 133/98, pulse 120, temperature 98.3 F (36.8 C), temperature source Oral, resp. rate 18, height 5\' 3"  (1.6 m), weight 59.875 kg (132 lb).Body mass index is 23.38 kg/(m^2).  General Appearance: Disheveled  Eye Solicitor::  Fair  Speech:  Clear and Coherent  Volume:  Normal  Mood:  Dysphoric  Affect:  Full Range  Thought Process:  Coherent  Orientation:  Full (Time, Place, and Person)  Thought Content:  WDL  Suicidal Thoughts:  No  Homicidal Thoughts:  No  Memory:  Immediate;   Fair Recent;   Fair Remote;   Fair  Judgement:  Fair  Insight:  Present  Psychomotor Activity:  Normal  Concentration:  Fair  Recall:  Fair  Akathisia:  No  Handed:  Right  AIMS (if indicated):     Assets:  Communication Skills Desire for Improvement  Sleep:  Number of Hours: 5.75    Current  Medications: Current Facility-Administered Medications  Medication Dose Route Frequency Provider Last Rate Last Dose  . acetaminophen (TYLENOL) tablet 650 mg  650 mg Oral Q6H PRN Nanine Means, NP   650 mg at 06/03/12 2008  . alum & mag hydroxide-simeth (MAALOX/MYLANTA) 200-200-20 MG/5ML suspension 30 mL  30 mL Oral Q4H PRN Nanine Means, NP      . guaiFENesin (MUCINEX) 12 hr tablet 600 mg  600 mg Oral BID Nanine Means, NP   600 mg at 06/05/12 0859  . hydrochlorothiazide (HYDRODIURIL) tablet 25 mg  25 mg Oral Daily Nanine Means, NP   25 mg at 06/05/12 0859  . hydrOXYzine (ATARAX/VISTARIL) tablet 25 mg  25 mg Oral Q6H PRN Nanine Means, NP   25 mg at 06/04/12 0035  . ibuprofen (ADVIL,MOTRIN) tablet 600 mg  600 mg Oral Q6H PRN Sanjuana Kava, NP   600 mg at 06/02/12 1626  . magnesium hydroxide (MILK OF MAGNESIA) suspension 30 mL  30 mL Oral Daily PRN Nanine Means, NP      . sertraline (ZOLOFT) tablet 50 mg  50 mg Oral Daily Ashaun Gaughan, MD   50 mg at 06/05/12 0900  . SUMAtriptan (IMITREX) tablet 100 mg  100 mg Oral Q2H PRN Nanine Means, NP      . traZODone (DESYREL) tablet 50 mg  50 mg Oral QHS PRN Verne Spurr, PA-C   50 mg at 06/04/12 0035    Lab Results: No results found  for this or any previous visit (from the past 48 hour(s)).  Physical Findings: AIMS: Facial and Oral Movements Muscles of Facial Expression: None, normal Lips and Perioral Area: None, normal Jaw: None, normal Tongue: None, normal,Extremity Movements Upper (arms, wrists, hands, fingers): None, normal Lower (legs, knees, ankles, toes): None, normal, Trunk Movements Neck, shoulders, hips: None, normal, Overall Severity Severity of abnormal movements (highest score from questions above): None, normal Incapacitation due to abnormal movements: None, normal Patient's awareness of abnormal movements (rate only patient's report): No Awareness, Dental Status Current problems with teeth and/or dentures?: No Does patient usually  wear dentures?: No  CIWA:  CIWA-Ar Total: 9  COWS:  COWS Total Score: 4   Treatment Plan Summary: Daily contact with patient to assess and evaluate symptoms and progress in treatment Medication management  Plan: Continue current plan of care. Obtain collateral from husband to verify patient`s story and plaan for discharge with appropriate follow up appointments.  Medical Decision Making Problem Points:  Established problem, stable/improving (1), Review of last therapy session (1) and Review of psycho-social stressors (1) Data Points:  Review of medication regiment & side effects (2) Review of new medications or change in dosage (2)  I certify that inpatient services furnished can reasonably be expected to improve the patient's condition.   Monique Hunt 06/05/2012, 11:46 AM

## 2012-06-05 NOTE — Progress Notes (Signed)
[facility] LCSW Group Therapy       Overcoming Obstacles 1:15 2:30 PM        06/05/2012 3:26 PM  Type of Therapy:  Group Therapy  Participation Level:  Minimal  Participation Quality:  Appropriate and Attentive  Affect:  Appropriate  Cognitive:  Appropriate  Insight:  Limited  Engagement in Therapy:  Limited  Modes of Intervention:  Discussion, Exploration, Problem-solving and Support  Summary of Progress/Problems: Patient was attentive in group but unable to state an obstacle she needs to overcome.  [name] 06/05/2012, 3:26 PM

## 2012-06-05 NOTE — Progress Notes (Signed)
Pt assigned at 2300 shift change, so assessment completed by previous RN.  D: Pt resting in bed, eyes closed, respirations even and unlabored.   A: Will continue to monitor.  Pt is to continue to attend all meals in cafeteria with unit group.  R: In no apparent distress.  Safety maintained. Dion Saucier RN

## 2012-06-06 MED ORDER — SERTRALINE HCL 50 MG PO TABS
50.0000 mg | ORAL_TABLET | Freq: Every day | ORAL | Status: DC
Start: 2012-06-06 — End: 2012-11-11

## 2012-06-06 MED ORDER — HYDROCHLOROTHIAZIDE 25 MG PO TABS: 25.0000 mg | ORAL_TABLET | Freq: Every day | ORAL | Status: DC

## 2012-06-06 MED ORDER — TRAZODONE HCL 50 MG PO TABS: 50.0000 mg | ORAL_TABLET | Freq: Every evening | ORAL | Status: DC | PRN

## 2012-06-06 NOTE — [facility] Suicide Risk Assessment (Signed)
Suicide Risk Assessment  Discharge Assessment     Demographic Factors:  Female, Caucasian, married  Mental Status Per Nursing Assessment::   On Admission:  Suicidal ideation indicated by patient  Current Mental Status by Physician: Patient alert and oriented to 4. Denies AH/VH/SI/Hi.  Loss Factors: Decrease in vocational status  Historical Factors: Impulsivity  Risk Reduction Factors:   Positive social support  Continued Clinical Symptoms:  Depression:   Recent sense of peace/wellbeing  Cognitive Features That Contribute To Risk:  Cognitively intact   Suicide Risk:  Minimal: No identifiable suicidal ideation.  Patients presenting with no risk factors but with morbid ruminations; may be classified as minimal risk based on the severity of the depressive symptoms  Discharge Diagnoses:   AXIS I:  Depressive Disorder NOS AXIS II:  No diagnosis AXIS III:   Past Medical History  Diagnosis Date  . Kidney stone   . Kidney stones   . Migraine   . Migraine    AXIS IV:  other psychosocial or environmental problems AXIS V:  61-70 mild symptoms  Plan Of Care/Follow-up recommendations:  Activity:  normal Diet:  normal Follow up with outpatient appointments.  Is patient on multiple antipsychotic therapies at discharge:  No   Has Patient had three or more failed trials of antipsychotic monotherapy by history:  No  Recommended Plan for Multiple Antipsychotic Therapies: NA  Greig Altergott 06/06/2012, 10:29 AM

## 2012-06-06 NOTE — Progress Notes (Signed)
Reviewed

## 2012-06-06 NOTE — Progress Notes (Signed)
Patient ID: Monique Hunt, female   DOB: 25-Aug-1960, 52 y.o.   MRN: [id number] Patient was discharged to ride home with her husband.  She denies SI/HI.  She verbalizes understanding of her d/c meds and her followup at [facility] tomorrow.  She ws given scripts and a supply off meds.  She was verbalizing appreciation for care given She listed friends and family as a support system.

## 2012-06-06 NOTE — Discharge Summary (Signed)
Physician Discharge Summary Note  Patient:  Monique Hunt is an 52 y.o., female MRN:  [id number] DOB:  [birth year] Patient phone:  [phone] (home)  Patient address:   [address] [place] [address],   Date of Admission:  05/31/2012 Date of Discharge: 06/06/2012  Reason for Admission:  Depression with suicide attempt, stimulant abuse  Discharge Diagnoses: Active Problems:  Depression  Review of Systems  Constitutional: Negative.   HENT: Negative.   Eyes: Negative.   Respiratory: Negative.   Cardiovascular: Negative.   Gastrointestinal: Negative.   Genitourinary: Negative.   Musculoskeletal: Negative.   Skin: Negative.   Neurological: Negative.   Endo/Heme/Allergies: Negative.   Psychiatric/Behavioral: Positive for substance abuse. The patient is nervous/anxious.    Axis Diagnosis:   AXIS I:  Anxiety Disorder NOS, Depressive Disorder NOS, Substance Abuse and Substance Induced Mood Disorder AXIS II:  Deferred AXIS III:   Past Medical History  Diagnosis Date  . Kidney stone   . Kidney stones   . Migraine   . Migraine    AXIS IV:  economic problems, occupational problems, other psychosocial or environmental problems, problems related to social environment and problems with primary support group AXIS V:  61-70 mild symptoms  Level of Care:  OP  Hospital Course:  Review of chart, vital signs, medications, and notes. 1-Individual and group therapy completed 2-Medication management for depression, substance abuse, and anxiety:  Medications reviewed with the patient and MD wrote Rx for her at discharge; she will further follow-up with Dr Tomasa Rand in out-patinet 3-Coping skills for depression, anxiety, and substance abuse developed and utilized 4-Crisis stabilization and management completed:  Patient stated she had been taking a "lot of Adderall"(over the limit prescribed) before admission and had not slept, which lead to her breakdown where she was sitting with a gun  to her head.  Outside AA/NA groups encouraged along with getting a sponsor. 5-Address health issues--monitoring blood pressures, stable 6-Treatment plan in place to prevent relapse of depression, substance abuse, and anxiety 7-Patient denied suicidal/homicidal ideations and auditory/visual hallucinations, follow-up appointments encouraged to attend, outside support groups encouraged and information given, supportive husband, patient stable for discharge  Consults:  None  Significant Diagnostic Studies:  labs: Completed and reviewed, stable  Discharge Vitals:   Blood pressure 145/78, pulse 105, temperature 98.2 F (36.8 C), temperature source Oral, resp. rate 16, height 5\' 3"  (1.6 m), weight 59.875 kg (132 lb). Body mass index is 23.38 kg/(m^2). Lab Results:   No results found for this or any previous visit (from the past 72 hour(s)).  Physical Findings: AIMS: Facial and Oral Movements Muscles of Facial Expression: None, normal Lips and Perioral Area: None, normal Jaw: None, normal Tongue: None, normal,Extremity Movements Upper (arms, wrists, hands, fingers): None, normal Lower (legs, knees, ankles, toes): None, normal, Trunk Movements Neck, shoulders, hips: None, normal, Overall Severity Severity of abnormal movements (highest score from questions above): None, normal Incapacitation due to abnormal movements: None, normal Patient's awareness of abnormal movements (rate only patient's report): No Awareness, Dental Status Current problems with teeth and/or dentures?: No Does patient usually wear dentures?: No  CIWA:  CIWA-Ar Total: 9  COWS:  COWS Total Score: 4   Psychiatric Specialty Exam: See Psychiatric Specialty Exam and Suicide Risk Assessment completed by Attending Physician prior to discharge.  Discharge destination:  Home  Is patient on multiple antipsychotic therapies at discharge:  No   Has Patient had three or more failed trials of antipsychotic monotherapy by history:   No  Recommended Plan for Multiple Antipsychotic Therapies:  N/A  Discharge Orders    Future Orders Please Complete By Expires   Diet - low sodium heart healthy      Increase activity slowly          Medication List     As of 06/06/2012  2:36 PM    TAKE these medications      Indication    hydrochlorothiazide 25 MG tablet   Commonly known as: HYDRODIURIL   Take 1 tablet (25 mg total) by mouth daily.    Indication: High Blood Pressure      sertraline 50 MG tablet   Commonly known as: ZOLOFT   Take 1 tablet (50 mg total) by mouth daily.    Indication: Major Depressive Disorder      SUMAtriptan 100 MG tablet   Commonly known as: IMITREX   Take 100 mg by mouth every 2 (two) hours as needed. For migraine       traZODone 50 MG tablet   Commonly known as: DESYREL   Take 1 tablet (50 mg total) by mouth at bedtime as needed for sleep (May repeat x1).    Indication: Trouble Sleeping           Follow-up Information    Follow up with Dr. Tomasa Rand - [facility]. On 06/07/2012. (You are scheduled to see Dr. Tomasa Rand on Wednesday, June 08, 2011 at 2:15 PM)    Contact information:   [address] [place], [place]   [address]  [phone]         Follow-up recommendations:  Activity as tolerated, low-sodium heart healthy diet  Comments:  Patient will follow-up with Dr Tomasa Rand for therapy and Rx, NA/AA support groups encouraged also  Total Discharge Time:  Greater than 30 minutes  Signed: Nanine Means, PMh-NP 06/06/2012, 2:36 PM

## 2012-06-06 NOTE — Progress Notes (Signed)
Patient ID: Monique Hunt, female   DOB: [birth year], 52 y.o.   MRN: [id number] D- Patient reports sleeping well and good appetite.  Her energy level is normal and her ability to pay attention is good.  She continues to have a cough but it is improving A- held BP med this am due to low BP and rapid pulse.  Plan to recheck at noon.  Took pulse manually and it was 100.  Patient has been attending groups and says that sessions the last few days have been excellent.

## 2012-06-06 NOTE — Progress Notes (Signed)
[facility] Adult Case Management Discharge Plan :  Will you be returning to the same living situation after discharge: Yes,  Patient discharing home with husband At discharge, do you have transportation home?:Yes,  Husband is transporting patient home Do you have the ability to pay for your medications:Yes,  Patient is able to obtain medications  Release of information consent forms completed and in the chart;  Patient's signature needed at discharge.  Patient to Follow up at: Follow-up Information    Follow up with Dr. Tomasa Rand [facility]. On 06/07/2012. (You are scheduled to see Dr. Tomasa Rand on Wednesday, June 08, 2011 at 2:15 PM)    Contact information:   [address] [place], [place]   [address]  [phone]         Patient denies SI/HI:   Yes,  Patient is no longer endorsing SI/HI or thoughts of self harm    Safety Planning and Suicide Prevention discussed:  Yes,  Reviewed during aftercare group.  [name] 06/06/2012, 1:17 PM

## 2012-06-09 NOTE — Progress Notes (Signed)
Patient Discharge Instructions:  After Visit Summary (AVS):   Faxed to:  06/09/12 Psychiatric Admission Assessment Note:   Faxed to:  06/09/12 Suicide Risk Assessment - Discharge Assessment:   Faxed to:  06/09/12 Faxed/Sent to the Next Level Care provider:  06/09/12 Faxed to [facility] @ [phone]  Jerelene Redden, 06/09/2012, 3:31 PM

## 2012-06-09 NOTE — Discharge Summary (Signed)
Reviewed

## 2012-06-15 ENCOUNTER — Other Ambulatory Visit: Payer: Self-pay | Admitting: Family Medicine

## 2012-06-15 DIAGNOSIS — Z1231 Encounter for screening mammogram for malignant neoplasm of breast: Secondary | ICD-10-CM

## 2012-07-07 ENCOUNTER — Ambulatory Visit
Admission: RE | Admit: 2012-07-07 | Discharge: 2012-07-07 | Disposition: A | Discharge status: Home / Self Care | Payer: 59 | Source: Ambulatory Visit | Attending: Family Medicine | Admitting: Family Medicine

## 2012-07-07 DIAGNOSIS — Z1231 Encounter for screening mammogram for malignant neoplasm of breast: Secondary | ICD-10-CM

## 2012-11-09 ENCOUNTER — Other Ambulatory Visit: Payer: Self-pay | Admitting: Nurse Practitioner

## 2012-11-09 ENCOUNTER — Other Ambulatory Visit (HOSPITAL_COMMUNITY)
Admission: RE | Admit: 2012-11-09 | Discharge: 2012-11-09 | Disposition: A | Discharge status: Home / Self Care | Payer: 59 | Source: Ambulatory Visit | Attending: Obstetrics and Gynecology | Admitting: Obstetrics and Gynecology

## 2012-11-09 DIAGNOSIS — N760 Acute vaginitis: Secondary | ICD-10-CM | POA: Insufficient documentation

## 2012-11-11 ENCOUNTER — Encounter (HOSPITAL_COMMUNITY): Payer: Self-pay | Admitting: Family Medicine

## 2012-11-11 ENCOUNTER — Emergency Department (HOSPITAL_COMMUNITY): Payer: 59

## 2012-11-11 ENCOUNTER — Emergency Department (HOSPITAL_COMMUNITY)
Admission: EM | Admit: 2012-11-11 | Discharge: 2012-11-12 | Disposition: A | Discharge status: Home / Self Care | Payer: 59 | Attending: Emergency Medicine | Admitting: Emergency Medicine

## 2012-11-11 DIAGNOSIS — M549 Dorsalgia, unspecified: Secondary | ICD-10-CM | POA: Insufficient documentation

## 2012-11-11 DIAGNOSIS — Z87891 Personal history of nicotine dependence: Secondary | ICD-10-CM | POA: Insufficient documentation

## 2012-11-11 DIAGNOSIS — G43909 Migraine, unspecified, not intractable, without status migrainosus: Secondary | ICD-10-CM | POA: Insufficient documentation

## 2012-11-11 DIAGNOSIS — Z87442 Personal history of urinary calculi: Secondary | ICD-10-CM | POA: Insufficient documentation

## 2012-11-11 DIAGNOSIS — R112 Nausea with vomiting, unspecified: Secondary | ICD-10-CM

## 2012-11-11 DIAGNOSIS — R197 Diarrhea, unspecified: Secondary | ICD-10-CM | POA: Insufficient documentation

## 2012-11-11 LAB — COMPREHENSIVE METABOLIC PANEL
ALT: 17 U/L (ref 0–35)
Alkaline Phosphatase: 86 U/L (ref 39–117)
CO2: 26 mEq/L (ref 19–32)
Chloride: 101 mEq/L (ref 96–112)
GFR calc Af Amer: >90 mL/min (ref >90)
GFR calc non Af Amer: >90 mL/min (ref >90)
Glucose, Bld: 114 mg/dL — ABNORMAL HIGH (ref 70–99)
Potassium: 3.3 mEq/L — ABNORMAL LOW (ref 3.5–5.1)
Sodium: 138 mEq/L (ref 135–145)
Total Bilirubin: 0.4 mg/dL (ref 0.3–1.2)
Total Protein: 6.6 g/dL (ref 6.0–8.3)

## 2012-11-11 LAB — URINALYSIS, ROUTINE W REFLEX MICROSCOPIC
Bilirubin Urine: NEGATIVE
Ketones, ur: NEGATIVE mg/dL
Specific Gravity, Urine: 1.021 (ref 1.005–1.030)
Urobilinogen, UA: 1 mg/dL (ref 0.0–1.0)

## 2012-11-11 LAB — CBC WITH DIFFERENTIAL/PLATELET
Hemoglobin: 13.5 g/dL (ref 12.0–15.0)
Lymphocytes Relative: 14 % (ref 12–46)
Lymphs Abs: 0.6 10*3/uL — ABNORMAL LOW (ref 0.7–4.0)
Neutro Abs: 3.3 10*3/uL (ref 1.7–7.7)
Neutrophils Relative %: 73 % (ref 43–77)
Platelets: 263 10*3/uL (ref 150–400)
RBC: 4.22 MIL/uL (ref 3.87–5.11)
WBC: 4.4 10*3/uL (ref 4.0–10.5)

## 2012-11-11 LAB — URINE MICROSCOPIC-ADD ON

## 2012-11-11 MED ORDER — ONDANSETRON HCL 4 MG/2ML IJ SOLN
4.0000 mg | Freq: Once | INTRAMUSCULAR | Status: AC
  Administered 2012-11-11: 4 mg via INTRAVENOUS
  Filled 2012-11-11: qty 2

## 2012-11-11 MED ORDER — KETOROLAC TROMETHAMINE 30 MG/ML IJ SOLN
30.0000 mg | Freq: Once | INTRAMUSCULAR | Status: AC
  Administered 2012-11-11: 30 mg via INTRAVENOUS
  Filled 2012-11-11: qty 1

## 2012-11-11 MED ORDER — SODIUM CHLORIDE 0.9 % IV BOLUS (SEPSIS)
1000.0000 mL | Freq: Once | INTRAVENOUS | Status: AC
  Administered 2012-11-11: 1000 mL via INTRAVENOUS

## 2012-11-11 NOTE — ED Provider Notes (Signed)
History     CSN: [id number]  Arrival date & time 11/11/12  2033   First MD Initiated Contact with Patient 11/11/12 2057      Chief Complaint  Patient presents with  . Emesis  . Diarrhea    (Consider location/radiation/quality/duration/timing/severity/associated sxs/prior treatment) Patient is a 52 y.o. female presenting with vomiting and diarrhea. The history is provided by the patient and the spouse. No language interpreter was used.  Emesis Severity:  Moderate Associated symptoms: diarrhea   Associated symptoms: no abdominal pain and no headaches   Associated symptoms comment:  Nausea, vomiting and diarrhea since around 2:00 a.m. This morning. No fever. No significant abdominal pain. She complains of right sided lateral upper back pain without known injury. No cough. She has a history of kidney stones but reports pain is higher than pain associated with stones in the past. She states her daughter has had N, V, D in the last 2-3 days as well.  Diarrhea Associated symptoms: vomiting   Associated symptoms: no abdominal pain, no fever and no headaches     Past Medical History  Diagnosis Date  . Kidney stone   . Kidney stones   . Migraine   . Migraine     Past Surgical History  Procedure Laterality Date  . Lithotripsy      No family history on file.  History  Substance Use Topics  . Smoking status: Former Games developer  . Smokeless tobacco: Not on file  . Alcohol Use: Yes     Comment: occassionally    OB History   Grav Para Term Preterm Abortions TAB SAB Ect Mult Living                  Review of Systems  Constitutional: Negative for fever.  Respiratory: Negative for shortness of breath.   Cardiovascular: Negative for chest pain.  Gastrointestinal: Positive for vomiting and diarrhea. Negative for abdominal pain.  Genitourinary: Negative for dysuria and flank pain.  Musculoskeletal: Positive for back pain.  Skin: Negative for color change and rash.  Neurological:  Negative for headaches.  Psychiatric/Behavioral: Negative for confusion.    Allergies  Review of patient's allergies indicates no known allergies.  Home Medications   Current Outpatient Rx  Name  Route  Sig  Dispense  Refill  . propranolol (INDERAL) 40 MG tablet   Oral   Take 40 mg by mouth 2 (two) times daily.         . SUMAtriptan (IMITREX) 100 MG tablet   Oral   Take 100 mg by mouth every 2 (two) hours as needed for migraine. For migraine         . venlafaxine XR (EFFEXOR-XR) 37.5 MG 24 hr capsule   Oral   Take 37.5 mg by mouth 2 (two) times daily.           BP 135/77  Pulse 116  Temp(Src) 99.1 F (37.3 C) (Oral)  Resp 22  Ht 5\' 5"  (1.651 m)  Wt 140 lb (63.504 kg)  BMI 23.3 kg/m2  SpO2 100%  LMP 02/28/2011  Physical Exam  Constitutional: She is oriented to person, place, and time. She appears well-developed and well-nourished. No distress.  HENT:  Head: Normocephalic.  Mouth/Throat: Mucous membranes are not dry.  Neck: Normal range of motion. Neck supple.  Cardiovascular: Regular rhythm.  Tachycardia present.   No murmur heard. Pulmonary/Chest: Effort normal. She has no wheezes. She has no rales.  Abdominal: Soft. There is no tenderness. There is no  rebound and no guarding.  Musculoskeletal: Normal range of motion. She exhibits no edema.  Neurological: She is alert and oriented to person, place, and time.  Skin: Skin is warm and dry.    ED Course  Procedures (including critical care time)  Labs Reviewed  CBC WITH DIFFERENTIAL  COMPREHENSIVE METABOLIC PANEL  LIPASE, BLOOD  URINALYSIS, ROUTINE W REFLEX MICROSCOPIC   Results for orders placed during the hospital encounter of 11/11/12  CBC WITH DIFFERENTIAL      Result Value Range   WBC 4.4  4.0 - 10.5 K/uL   RBC 4.22  3.87 - 5.11 MIL/uL   Hemoglobin 13.5  12.0 - 15.0 g/dL   HCT 16.1  09.6 - 04.5 %   MCV 91.2  78.0 - 100.0 fL   MCH 32.0  26.0 - 34.0 pg   MCHC 35.1  30.0 - 36.0 g/dL   RDW 40.9   81.1 - 91.4 %   Platelets 263  150 - 400 K/uL   Neutrophils Relative % 73  43 - 77 %   Neutro Abs 3.3  1.7 - 7.7 K/uL   Lymphocytes Relative 14  12 - 46 %   Lymphs Abs 0.6 (*) 0.7 - 4.0 K/uL   Monocytes Relative 10  3 - 12 %   Monocytes Absolute 0.5  0.1 - 1.0 K/uL   Eosinophils Relative 2  0 - 5 %   Eosinophils Absolute 0.1  0.0 - 0.7 K/uL   Basophils Relative 0  0 - 1 %   Basophils Absolute 0.0  0.0 - 0.1 K/uL  COMPREHENSIVE METABOLIC PANEL      Result Value Range   Sodium 138  135 - 145 mEq/L   Potassium 3.3 (*) 3.5 - 5.1 mEq/L   Chloride 101  96 - 112 mEq/L   CO2 26  19 - 32 mEq/L   Glucose, Bld 114 (*) 70 - 99 mg/dL   BUN 16  6 - 23 mg/dL   Creatinine, Ser 7.82  0.50 - 1.10 mg/dL   Calcium 9.2  8.4 - 95.6 mg/dL   Total Protein 6.6  6.0 - 8.3 g/dL   Albumin 3.3 (*) 3.5 - 5.2 g/dL   AST 24  0 - 37 U/L   ALT 17  0 - 35 U/L   Alkaline Phosphatase 86  39 - 117 U/L   Total Bilirubin 0.4  0.3 - 1.2 mg/dL   GFR calc non Af Amer >90  >90 mL/min   GFR calc Af Amer >90  >90 mL/min  LIPASE, BLOOD      Result Value Range   Lipase 22  11 - 59 U/L  URINALYSIS, ROUTINE W REFLEX MICROSCOPIC      Result Value Range   Color, Urine YELLOW  YELLOW   APPearance CLOUDY (*) CLEAR   Specific Gravity, Urine 1.021  1.005 - 1.030   pH 6.0  5.0 - 8.0   Glucose, UA NEGATIVE  NEGATIVE mg/dL   Hgb urine dipstick SMALL (*) NEGATIVE   Bilirubin Urine NEGATIVE  NEGATIVE   Ketones, ur NEGATIVE  NEGATIVE mg/dL   Protein, ur NEGATIVE  NEGATIVE mg/dL   Urobilinogen, UA 1.0  0.0 - 1.0 mg/dL   Nitrite NEGATIVE  NEGATIVE   Leukocytes, UA TRACE (*) NEGATIVE  URINE MICROSCOPIC-ADD ON      Result Value Range   Squamous Epithelial / LPF RARE  RARE   WBC, UA 0-2  <3 WBC/hpf   RBC / HPF 0-2  <3 RBC/hpf  Crystals CA OXALATE CRYSTALS (*) NEGATIVE   Urine-Other MUCOUS PRESENT    POCT PREGNANCY, URINE      Result Value Range   Preg Test, Ur NEGATIVE  NEGATIVE   Dg Chest 2 View  11/12/2012   *RADIOLOGY  REPORT*  Clinical Data: Emesis, diarrhea.  CHEST - 2 VIEW  Comparison: 04/07/2011  Findings: Mild thoracic dextroscoliosis with multilevel spondylitic changes as before.  Lungs clear.  Heart size upper limits normal. No effusion.  IMPRESSION: No acute disease   Original Report Authenticated By: D. Andria Rhein, MD   No results found.   No diagnosis found.  1. N, V 2. Right side pain  MDM  Right sided pain that is improved with Toradol. Nausea resolved with Zofran. She is feeling better after fluids. Urine has Ca Ox. Crystals in the setting of history of kidney stones. DDx viral GE (daughter with same symptoms) vs Renal colic (picture as above).         Arnoldo Hooker, PA-C 11/12/12 0040

## 2012-11-11 NOTE — ED Notes (Signed)
Patient transported to X-ray 

## 2012-11-11 NOTE — ED Notes (Signed)
Patient states that she started having nausea, vomiting and diarrhea last night. States that she was seen by her dr on Thursday and was told that she had bacterial vaginal infection but was not put on any medications. States she is having dysuria.

## 2012-11-11 NOTE — ED Notes (Signed)
Pt denies hematuria and dysuria

## 2012-11-12 MED ORDER — HYDROCODONE-ACETAMINOPHEN 5-325 MG PO TABS: 1.0000 | ORAL_TABLET | ORAL | Status: DC | PRN

## 2012-11-12 MED ORDER — ONDANSETRON HCL 4 MG PO TABS: 4.0000 mg | ORAL_TABLET | Freq: Four times a day (QID) | ORAL | Status: DC

## 2012-11-12 NOTE — ED Provider Notes (Signed)
Medical screening examination/treatment/procedure(s) were performed by non-physician practitioner and as supervising physician I was immediately available for consultation/collaboration.   Richardean Canal, MD 11/12/12 1452

## 2014-09-18 ENCOUNTER — Emergency Department (HOSPITAL_COMMUNITY)
Admission: EM | Admit: 2014-09-18 | Discharge: 2014-09-18 | Disposition: A | Discharge status: Home / Self Care | Payer: Self-pay | Attending: Emergency Medicine | Admitting: Emergency Medicine

## 2014-09-18 ENCOUNTER — Encounter (HOSPITAL_COMMUNITY): Payer: Self-pay

## 2014-09-18 DIAGNOSIS — Z87891 Personal history of nicotine dependence: Secondary | ICD-10-CM | POA: Insufficient documentation

## 2014-09-18 DIAGNOSIS — J029 Acute pharyngitis, unspecified: Secondary | ICD-10-CM | POA: Insufficient documentation

## 2014-09-18 DIAGNOSIS — Z79899 Other long term (current) drug therapy: Secondary | ICD-10-CM | POA: Insufficient documentation

## 2014-09-18 DIAGNOSIS — Z87442 Personal history of urinary calculi: Secondary | ICD-10-CM | POA: Insufficient documentation

## 2014-09-18 DIAGNOSIS — R05 Cough: Secondary | ICD-10-CM | POA: Insufficient documentation

## 2014-09-18 DIAGNOSIS — G43909 Migraine, unspecified, not intractable, without status migrainosus: Secondary | ICD-10-CM | POA: Insufficient documentation

## 2014-09-18 DIAGNOSIS — R197 Diarrhea, unspecified: Secondary | ICD-10-CM | POA: Insufficient documentation

## 2014-09-18 DIAGNOSIS — R1110 Vomiting, unspecified: Secondary | ICD-10-CM | POA: Insufficient documentation

## 2014-09-18 DIAGNOSIS — R6889 Other general symptoms and signs: Secondary | ICD-10-CM

## 2014-09-18 DIAGNOSIS — M791 Myalgia: Secondary | ICD-10-CM | POA: Insufficient documentation

## 2014-09-18 MED ORDER — IBUPROFEN 800 MG PO TABS
800.0000 mg | ORAL_TABLET | Freq: Once | ORAL | Status: AC
  Administered 2014-09-18: 800 mg via ORAL
  Filled 2014-09-18: qty 1

## 2014-09-18 MED ORDER — OSELTAMIVIR PHOSPHATE 75 MG PO CAPS: 75.0000 mg | ORAL_CAPSULE | Freq: Two times a day (BID) | ORAL | Status: DC

## 2014-09-18 MED ORDER — ONDANSETRON 4 MG PO TBDP
4.0000 mg | ORAL_TABLET | Freq: Once | ORAL | Status: AC
  Administered 2014-09-18: 4 mg via ORAL
  Filled 2014-09-18: qty 1

## 2014-09-18 MED ORDER — ONDANSETRON 4 MG PO TBDP: 4.0000 mg | ORAL_TABLET | Freq: Three times a day (TID) | ORAL | Status: DC | PRN

## 2014-09-18 MED ORDER — ACETAMINOPHEN 500 MG PO TABS
1000.0000 mg | ORAL_TABLET | Freq: Once | ORAL | Status: AC
  Administered 2014-09-18: 1000 mg via ORAL
  Filled 2014-09-18: qty 2

## 2014-09-18 NOTE — ED Provider Notes (Signed)
TIME SEEN: 1:45 AM  CHIEF COMPLAINT: Headache, dry cough, sore throat, chills, myalgias  HPI: Pt is a 54 y.o. female with history of migraines, kidney stones who presents to the emergency department with complaints of 2 days of diffuse throbbing headache, sore throat, dry cough, myalgias, one episode of vomiting and one episode of diarrhea that started yesterday. Denies recent international travel. Has not taken any medication for her pain at home. Did not have an influenza vaccination. Has had multiple sick contacts at work. No neck pain or neck stiffness. No rash. No abdominal pain. No head injury. Not on anticoagulation. No numbness, tingling or focal weakness.  ROS: See HPI Constitutional: no fever  Eyes: no drainage  ENT:  runny nose   Cardiovascular:  no chest pain  Resp: no SOB  GI:  vomiting GU: no dysuria Integumentary: no rash  Allergy: no hives  Musculoskeletal: no leg swelling  Neurological: no slurred speech ROS otherwise negative  PAST MEDICAL HISTORY/PAST SURGICAL HISTORY:  Past Medical History  Diagnosis Date  . Kidney stone   . Kidney stones   . Migraine   . Migraine     MEDICATIONS:  Prior to Admission medications   Medication Sig Start Date End Date Taking? Authorizing Provider  HYDROcodone-acetaminophen (NORCO/VICODIN) 5-325 MG per tablet Take 1 tablet by mouth every 4 (four) hours as needed for pain. 11/12/12   Charlann Lange, PA-C  ondansetron (ZOFRAN) 4 MG tablet Take 1 tablet (4 mg total) by mouth every 6 (six) hours. 11/12/12   Charlann Lange, PA-C  propranolol (INDERAL) 40 MG tablet Take 40 mg by mouth 2 (two) times daily.    Historical Provider, MD  SUMAtriptan (IMITREX) 100 MG tablet Take 100 mg by mouth every 2 (two) hours as needed for migraine. For migraine    Historical Provider, MD  venlafaxine XR (EFFEXOR-XR) 37.5 MG 24 hr capsule Take 37.5 mg by mouth 2 (two) times daily.    Historical Provider, MD    ALLERGIES:  No Known Allergies  SOCIAL  HISTORY:  History  Substance Use Topics  . Smoking status: Former Research scientist (life sciences)  . Smokeless tobacco: Not on file  . Alcohol Use: Yes     Comment: occassionally    FAMILY HISTORY: No family history on file.  EXAM: BP 183/96 mmHg  Pulse 101  Temp(Src) 97.5 F (36.4 C) (Oral)  Resp 18  SpO2 98%  LMP 02/28/2011 CONSTITUTIONAL: Alert and oriented and responds appropriately to questions. Well-appearing; well-nourished, nontoxic HEAD: Normocephalic EYES: Conjunctivae clear, PERRL ENT: normal nose; no rhinorrhea; moist mucous membranes; pharynx without lesions noted NECK: Supple, no meningismus, no LAD  CARD: RRR; S1 and S2 appreciated; no murmurs, no clicks, no rubs, no gallops RESP: Normal chest excursion without splinting or tachypnea; breath sounds clear and equal bilaterally; no wheezes, no rhonchi, no rales, no hypoxia or respiratory distress, speaking full sentences ABD/GI: Normal bowel sounds; non-distended; soft, non-tender, no rebound, no guarding BACK:  The back appears normal and is non-tender to palpation, there is no CVA tenderness EXT: Normal ROM in all joints; non-tender to palpation; no edema; normal capillary refill; no cyanosis    SKIN: Normal color for age and race; warm, no rash NEURO: Moves all extremities equally, sensation to light touch intact diffusely, cranial nerves II through XII intact PSYCH: The patient's mood and manner are appropriate. Grooming and personal hygiene are appropriate.  MEDICAL DECISION MAKING: Patient here with flulike symptoms. She is hemodynamically stable. Will treat with ibuprofen, Tylenol, Zofran. We'll discharge  with prescription for Tamiflu and Zofran. Have advised her to alternate Tylenol and Motrin. She is nontoxic on exam. Lungs are clear. Abdomen soft. No rash. Neurologically intact. We'll provide work note. Discussed return precautions. She verbalized understanding and is comfortable with plan.       [place], DO 09/18/14  [phone]

## 2014-09-18 NOTE — Discharge Instructions (Signed)
You may alternate between Tylenol 1000 mg every 6 hours as needed for fever and pain and ibuprofen 800 mg every 8 hours as needed for fever and pain. Both of these medications are found over-the-counter. You may also use over-the-counter medications for cough and nasal decongestion.  Please rest, drink plenty of fluids.   Influenza Influenza ("the flu") is a viral infection of the respiratory tract. It occurs more often in winter months because people spend more time in close contact with one another. Influenza can make you feel very sick. Influenza easily spreads from person to person (contagious). CAUSES  Influenza is caused by a virus that infects the respiratory tract. You can catch the virus by breathing in droplets from an infected person's cough or sneeze. You can also catch the virus by touching something that was recently contaminated with the virus and then touching your mouth, nose, or eyes. RISKS AND COMPLICATIONS You may be at risk for a more severe case of influenza if you smoke cigarettes, have diabetes, have chronic heart disease (such as heart failure) or lung disease (such as asthma), or if you have a weakened immune system. Elderly people and pregnant women are also at risk for more serious infections. The most common problem of influenza is a lung infection (pneumonia). Sometimes, this problem can require emergency medical care and may be life threatening. SIGNS AND SYMPTOMS  Symptoms typically last 4 to 10 days and may include:  Fever.  Chills.  Headache, body aches, and muscle aches.  Sore throat.  Chest discomfort and cough.  Poor appetite.  Weakness or feeling tired.  Dizziness.  Nausea or vomiting. DIAGNOSIS  Diagnosis of influenza is often made based on your history and a physical exam. A nose or throat swab test can be done to confirm the diagnosis. TREATMENT  In mild cases, influenza goes away on its own. Treatment is directed at relieving symptoms. For  more severe cases, your health care provider may prescribe antiviral medicines to shorten the sickness. Antibiotic medicines are not effective because the infection is caused by a virus, not by bacteria. HOME CARE INSTRUCTIONS  Take medicines only as directed by your health care provider.  Use a cool mist humidifier to make breathing easier.  Get plenty of rest until your temperature returns to normal. This usually takes 3 to 4 days.  Drink enough fluid to keep your urine clear or pale yellow.  Cover yourmouth and nosewhen coughing or sneezing,and wash your handswellto prevent thevirusfrom spreading.  Stay homefromwork orschool untilthe fever is gonefor at least 63full day. PREVENTION  An annual influenza vaccination (flu shot) is the best way to avoid getting influenza. An annual flu shot is now routinely recommended for all adults in the [place] IF:  You experiencechest pain, yourcough worsens,or you producemore mucus.  Youhave nausea,vomiting, ordiarrhea.  Your fever returns or gets worse. SEEK IMMEDIATE MEDICAL CARE IF:  You havetrouble breathing, you become short of breath,or your skin ornails becomebluish.  You have severe painor stiffnessin the neck.  You develop a sudden headache, or pain in the face or ear.  You have nausea or vomiting that you cannot control. MAKE SURE YOU:   Understand these instructions.  Will watch your condition.  Will get help right away if you are not doing well or get worse. Document Released: 05/14/2000 Document Revised: 10/01/2013 Document Reviewed: 08/16/2011 [facility] Patient Information 2015 [place], [place]. This information is not intended to replace advice given to you by your health  care provider. Make sure you discuss any questions you have with your health care provider.

## 2014-09-18 NOTE — ED Notes (Signed)
Pt presents with c/o headache, sore throat, and cough that started last night. Pt reports she has been around others who have been sick.

## 2014-09-19 ENCOUNTER — Emergency Department (HOSPITAL_COMMUNITY)
Admission: EM | Admit: 2014-09-19 | Discharge: 2014-09-19 | Disposition: A | Discharge status: Home / Self Care | Payer: Self-pay | Attending: Emergency Medicine | Admitting: Emergency Medicine

## 2014-09-19 ENCOUNTER — Encounter (HOSPITAL_COMMUNITY): Payer: Self-pay | Admitting: Emergency Medicine

## 2014-09-19 DIAGNOSIS — J029 Acute pharyngitis, unspecified: Secondary | ICD-10-CM | POA: Insufficient documentation

## 2014-09-19 DIAGNOSIS — Z87891 Personal history of nicotine dependence: Secondary | ICD-10-CM | POA: Insufficient documentation

## 2014-09-19 DIAGNOSIS — R000 Tachycardia, unspecified: Secondary | ICD-10-CM | POA: Insufficient documentation

## 2014-09-19 DIAGNOSIS — Z79899 Other long term (current) drug therapy: Secondary | ICD-10-CM | POA: Insufficient documentation

## 2014-09-19 DIAGNOSIS — G4489 Other headache syndrome: Secondary | ICD-10-CM | POA: Insufficient documentation

## 2014-09-19 DIAGNOSIS — R112 Nausea with vomiting, unspecified: Secondary | ICD-10-CM

## 2014-09-19 DIAGNOSIS — R197 Diarrhea, unspecified: Secondary | ICD-10-CM | POA: Insufficient documentation

## 2014-09-19 DIAGNOSIS — Z87442 Personal history of urinary calculi: Secondary | ICD-10-CM | POA: Insufficient documentation

## 2014-09-19 NOTE — ED Provider Notes (Signed)
CSN: [id number]     Arrival date & time 09/19/14  1701 History   First MD Initiated Contact with Patient 09/19/14 1742     Chief Complaint  Patient presents with  . Nausea  . Emesis  . Headache     (Consider location/radiation/quality/duration/timing/severity/associated sxs/prior Treatment) HPI Patient is a 54 year old female past medical history of migraines who presents the ER with multiple complaints. Patient states she was here several days ago for similar complaints. Patient states starting Sunday night began experiencing diffuse, throbbing headaches, sore throat, dry cough, myalgias and diarrhea. Patient states being discharged from the ER she has not used the prescribed Tamiflu. She states she went to her primary care doctor for the same symptoms today, was given a flu swab which was negative. Patient states this afternoon she began feeling nauseated, and vomited times one. Patient states this concerned her and she came to the emergency room tonight. As I walked in the room to interview the patient, patient states her headache has gone completely. She states that she feels completely better after vomiting, and is expressing no nausea currently. During my interview patient with complains of mild back discomfort only when she coughs. Patient denies headache, blurred vision, dizziness, weakness, chest pain, shortness of breath, fever, abdominal pain, nausea, vomiting, diarrhea, dysuria.  Past Medical History  Diagnosis Date  . Kidney stone   . Kidney stones   . Migraine   . Migraine    Past Surgical History  Procedure Laterality Date  . Lithotripsy     No family history on file. History  Substance Use Topics  . Smoking status: Former Research scientist (life sciences)  . Smokeless tobacco: Not on file  . Alcohol Use: Yes     Comment: occassionally   OB History    No data available     Review of Systems  Constitutional: Negative for fever.  HENT: Negative for trouble swallowing.   Eyes: Negative for  visual disturbance.  Respiratory: Negative for shortness of breath.   Cardiovascular: Negative for chest pain.  Gastrointestinal: Negative for nausea, vomiting and abdominal pain.  Genitourinary: Negative for dysuria.  Musculoskeletal: Negative for neck pain.  Skin: Negative for rash.  Neurological: Negative for dizziness, weakness and numbness.  Psychiatric/Behavioral: Negative.       Allergies  Review of patient's allergies indicates no known allergies.  Home Medications   Prior to Admission medications   Medication Sig Start Date End Date Taking? Authorizing Provider  HYDROcodone-acetaminophen (NORCO/VICODIN) 5-325 MG per tablet Take 1 tablet by mouth every 4 (four) hours as needed for pain. 11/12/12  Yes Shari Upstill, PA-C  omeprazole (PRILOSEC) 40 MG capsule Take 40 mg by mouth daily as needed (heartburn).   Yes Historical Provider, MD  ondansetron (ZOFRAN ODT) 4 MG disintegrating tablet Take 1 tablet (4 mg total) by mouth every 8 (eight) hours as needed for nausea or vomiting. 09/18/14  Yes Kristen N Ward, DO  venlafaxine XR (EFFEXOR-XR) 37.5 MG 24 hr capsule Take 37.5 mg by mouth 2 (two) times daily.   Yes Historical Provider, MD  ondansetron (ZOFRAN) 4 MG tablet Take 1 tablet (4 mg total) by mouth every 6 (six) hours. Patient not taking: Reported on 09/19/2014 11/12/12   Charlann Lange, PA-C  oseltamivir (TAMIFLU) 75 MG capsule Take 1 capsule (75 mg total) by mouth every 12 (twelve) hours. Patient not taking: Reported on 09/19/2014 09/18/14   Kristen N Ward, DO   BP 147/85 mmHg  Pulse 106  Temp(Src) 97.7 F (36.5 C) (Oral)  Resp 14  SpO2 96%  LMP 02/28/2011 Physical Exam  Constitutional: She is oriented to person, place, and time. She appears well-developed and well-nourished. No distress.  HENT:  Head: Normocephalic and atraumatic.  Mouth/Throat: Oropharynx is clear and moist. No oropharyngeal exudate.  Eyes: Conjunctivae and EOM are normal. Pupils are equal, round, and  reactive to light. Right eye exhibits no discharge. Left eye exhibits no discharge. No scleral icterus.  Neck: Normal range of motion and full passive range of motion without pain. Neck supple. No spinous process tenderness and no muscular tenderness present. No rigidity. No edema, no erythema and normal range of motion present. No Brudzinski's sign and no Kernig's sign noted.  Cardiovascular: Regular rhythm, S1 normal, S2 normal and normal heart sounds.  Tachycardia present.   No murmur heard. Tachycardia at 105 on exam  Pulmonary/Chest: Effort normal and breath sounds normal. No accessory muscle usage. No tachypnea. No respiratory distress.  Abdominal: Soft. Normal appearance and bowel sounds are normal. There is no tenderness. There is no rigidity, no guarding, no tenderness at McBurney's point and negative Murphy's sign.  Musculoskeletal: Normal range of motion. She exhibits no edema or tenderness.  Neurological: She is alert and oriented to person, place, and time. She has normal strength. No cranial nerve deficit or sensory deficit. Coordination normal. GCS eye subscore is 4. GCS verbal subscore is 5. GCS motor subscore is 6.  Patient fully alert, answering questions appropriately in full, clear sentences. Cranial nerves II through XII grossly intact. Motor strength 5 out of 5 in all major muscle groups of upper and lower extremities. Distal sensation intact.   Skin: Skin is warm and dry. No rash noted. She is not diaphoretic.  Psychiatric: She has a normal mood and affect.  Nursing note and vitals reviewed.   ED Course  Procedures (including critical care time) Labs Review Labs Reviewed - No data to display  Imaging Review No results found.   EKG Interpretation None      MDM   Final diagnoses:  Other headache syndrome  Nausea vomiting and diarrhea    Patient here multiple, vague complaints which have been ongoing and evaluated by several providers already. I spoke with  patient at length at how further workup may be necessary tonight with her nausea and vomiting, and patient insisted that she feels better now and does not need any workup including imaging, labs, fluid therapy or medication. Patient states she has had similar headaches in the past, states she had a workup at [facility] for similar headache which was negative for any acute pathology including CT of her head. With patient's physical exam there is no concern for meningitis, ICH, SAH. Her neuro exam is benign. Abdominal exam benign. There is no abdominal tenderness. No concern for surgical or acute abdomen. I discussed the benefits of receiving further workup and care here, and risks of refusing it. Patient declines any further workup at this point, states she feels completely better and wishes to leave. Patient verbalizes understanding of the risks of refusing further care, and I encouraged patient follow up with her primary care physician. I discussed return precautions with patient, patient verbalizes understanding and agreement of this plan. Encouraged patient to call or return to the ER if any worsening symptoms or should she have any questions or concerns.  BP 147/85 mmHg  Pulse 106  Temp(Src) 97.7 F (36.5 C) (Oral)  Resp 14  SpO2 96%  LMP 02/28/2011  Signed,  Dahlia Bailiff,  PA-C 7:33 PM   Dahlia Bailiff, PA-C 09/19/14 [place], PA-C 09/19/14 1943  Quintella Reichert, MD 09/19/14 2249

## 2014-09-19 NOTE — ED Notes (Signed)
Pt states that she has been having headache, back ache, productive cough, vomiting and diarrhea that started on Sunday night.  States she was here on Tuesday night for same.

## 2014-09-19 NOTE — Discharge Instructions (Signed)
General Headache Without Cause A headache is pain or discomfort felt around the head or neck area. The specific cause of a headache may not be found. There are many causes and types of headaches. A few common ones are:  Tension headaches.  Migraine headaches.  Cluster headaches.  Chronic daily headaches. HOME CARE INSTRUCTIONS   Keep all follow-up appointments with your caregiver or any specialist referral.  Only take over-the-counter or prescription medicines for pain or discomfort as directed by your caregiver.  Lie down in a dark, quiet room when you have a headache.  Keep a headache journal to find out what may trigger your migraine headaches. For example, write down:  What you eat and drink.  How much sleep you get.  Any change to your diet or medicines.  Try massage or other relaxation techniques.  Put ice packs or heat on the head and neck. Use these 3 to 4 times per day for 15 to 20 minutes each time, or as needed.  Limit stress.  Sit up straight, and do not tense your muscles.  Quit smoking if you smoke.  Limit alcohol use.  Decrease the amount of caffeine you drink, or stop drinking caffeine.  Eat and sleep on a regular schedule.  Get 7 to 9 hours of sleep, or as recommended by your caregiver.  Keep lights dim if bright lights bother you and make your headaches worse. SEEK MEDICAL CARE IF:   You have problems with the medicines you were prescribed.  Your medicines are not working.  You have a change from the usual headache.  You have nausea or vomiting. SEEK IMMEDIATE MEDICAL CARE IF:   Your headache becomes severe.  You have a fever.  You have a stiff neck.  You have loss of vision.  You have muscular weakness or loss of muscle control.  You start losing your balance or have trouble walking.  You feel faint or pass out.  You have severe symptoms that are different from your first symptoms. MAKE SURE YOU:   Understand these  instructions.  Will watch your condition.  Will get help right away if you are not doing well or get worse. Document Released: 05/17/2005 Document Revised: 08/09/2011 Document Reviewed: 06/02/2011 [facility] Patient Information 2015 [place], [place]. This information is not intended to replace advice given to you by your health care provider. Make sure you discuss any questions you have with your health care provider.  Viral Gastroenteritis Viral gastroenteritis is also known as stomach flu. This condition affects the stomach and intestinal tract. It can cause sudden diarrhea and vomiting. The illness typically lasts 3 to 8 days. Most people develop an immune response that eventually gets rid of the virus. While this natural response develops, the virus can make you quite ill. CAUSES  Many different viruses can cause gastroenteritis, such as rotavirus or noroviruses. You can catch one of these viruses by consuming contaminated food or water. You may also catch a virus by sharing utensils or other personal items with an infected person or by touching a contaminated surface. SYMPTOMS  The most common symptoms are diarrhea and vomiting. These problems can cause a severe loss of body fluids (dehydration) and a body salt (electrolyte) imbalance. Other symptoms may include:  Fever.  Headache.  Fatigue.  Abdominal pain. DIAGNOSIS  Your caregiver can usually diagnose viral gastroenteritis based on your symptoms and a physical exam. A stool sample may also be taken to test for the presence of viruses or other  infections. TREATMENT  This illness typically goes away on its own. Treatments are aimed at rehydration. The most serious cases of viral gastroenteritis involve vomiting so severely that you are not able to keep fluids down. In these cases, fluids must be given through an intravenous line (IV). HOME CARE INSTRUCTIONS   Drink enough fluids to keep your urine clear or pale yellow. Drink small  amounts of fluids frequently and increase the amounts as tolerated.  Ask your caregiver for specific rehydration instructions.  Avoid:  Foods high in sugar.  Alcohol.  Carbonated drinks.  Tobacco.  Juice.  Caffeine drinks.  Extremely hot or cold fluids.  Fatty, greasy foods.  Too much intake of anything at one time.  Dairy products until 24 to 48 hours after diarrhea stops.  You may consume probiotics. Probiotics are active cultures of beneficial bacteria. They may lessen the amount and number of diarrheal stools in adults. Probiotics can be found in yogurt with active cultures and in supplements.  Wash your hands well to avoid spreading the virus.  Only take over-the-counter or prescription medicines for pain, discomfort, or fever as directed by your caregiver. Do not give aspirin to children. Antidiarrheal medicines are not recommended.  Ask your caregiver if you should continue to take your regular prescribed and over-the-counter medicines.  Keep all follow-up appointments as directed by your caregiver. SEEK IMMEDIATE MEDICAL CARE IF:   You are unable to keep fluids down.  You do not urinate at least once every 6 to 8 hours.  You develop shortness of breath.  You notice blood in your stool or vomit. This may look like coffee grounds.  You have abdominal pain that increases or is concentrated in one small area (localized).  You have persistent vomiting or diarrhea.  You have a fever.  The patient is a child younger than 3 months, and he or she has a fever.  The patient is a child older than 3 months, and he or she has a fever and persistent symptoms.  The patient is a child older than 3 months, and he or she has a fever and symptoms suddenly get worse.  The patient is a baby, and he or she has no tears when crying. MAKE SURE YOU:   Understand these instructions.  Will watch your condition.  Will get help right away if you are not doing well or get  worse. Document Released: 05/17/2005 Document Revised: 08/09/2011 Document Reviewed: 03/03/2011 [facility] Patient Information 2015 [place], [place]. This information is not intended to replace advice given to you by your health care provider. Make sure you discuss any questions you have with your health care provider.

## 2017-12-01 ENCOUNTER — Emergency Department (HOSPITAL_COMMUNITY): Payer: Self-pay

## 2017-12-01 ENCOUNTER — Encounter (HOSPITAL_COMMUNITY): Payer: Self-pay | Admitting: Emergency Medicine

## 2017-12-01 ENCOUNTER — Other Ambulatory Visit: Payer: Self-pay

## 2017-12-01 ENCOUNTER — Observation Stay (HOSPITAL_COMMUNITY)
Admission: EM | Admit: 2017-12-01 | Discharge: 2017-12-01 | Disposition: A | Discharge status: Home / Self Care | Payer: Self-pay | Attending: Internal Medicine | Admitting: Internal Medicine

## 2017-12-01 DIAGNOSIS — Z79899 Other long term (current) drug therapy: Secondary | ICD-10-CM | POA: Insufficient documentation

## 2017-12-01 DIAGNOSIS — F32A Depression, unspecified: Secondary | ICD-10-CM | POA: Diagnosis present

## 2017-12-01 DIAGNOSIS — R0602 Shortness of breath: Secondary | ICD-10-CM | POA: Insufficient documentation

## 2017-12-01 DIAGNOSIS — I10 Essential (primary) hypertension: Secondary | ICD-10-CM

## 2017-12-01 DIAGNOSIS — Z87891 Personal history of nicotine dependence: Secondary | ICD-10-CM

## 2017-12-01 DIAGNOSIS — R9431 Abnormal electrocardiogram [ECG] [EKG]: Secondary | ICD-10-CM

## 2017-12-01 DIAGNOSIS — R079 Chest pain, unspecified: Principal | ICD-10-CM | POA: Diagnosis present

## 2017-12-01 DIAGNOSIS — F329 Major depressive disorder, single episode, unspecified: Secondary | ICD-10-CM | POA: Diagnosis present

## 2017-12-01 DIAGNOSIS — R911 Solitary pulmonary nodule: Secondary | ICD-10-CM

## 2017-12-01 DIAGNOSIS — R072 Precordial pain: Secondary | ICD-10-CM

## 2017-12-01 DIAGNOSIS — E041 Nontoxic single thyroid nodule: Secondary | ICD-10-CM

## 2017-12-01 HISTORY — DX: Essential (primary) hypertension: I10

## 2017-12-01 LAB — BASIC METABOLIC PANEL
Anion gap: 12 (ref 5–15)
BUN: 31 mg/dL — ABNORMAL HIGH (ref 6–20)
CHLORIDE: 101 mmol/L (ref 98–111)
CO2: 23 mmol/L (ref 22–32)
CREATININE: 1.09 mg/dL — AB (ref 0.44–1.00)
Calcium: 9.9 mg/dL (ref 8.9–10.3)
GFR calc non Af Amer: 55 mL/min — ABNORMAL LOW (ref >60)
Glucose, Bld: 104 mg/dL — ABNORMAL HIGH (ref 70–99)
Potassium: 3.9 mmol/L (ref 3.5–5.1)
Sodium: 136 mmol/L (ref 135–145)

## 2017-12-01 LAB — TROPONIN I
Troponin I: <0.03 ng/mL (ref <0.03)
Troponin I: <0.03 ng/mL (ref <0.03)

## 2017-12-01 LAB — CBC
HEMATOCRIT: 40.8 % (ref 36.0–46.0)
Hemoglobin: 13.7 g/dL (ref 12.0–15.0)
MCH: 31.9 pg (ref 26.0–34.0)
MCHC: 33.6 g/dL (ref 30.0–36.0)
MCV: 95.1 fL (ref 78.0–100.0)
PLATELETS: 330 10*3/uL (ref 150–400)
RBC: 4.29 MIL/uL (ref 3.87–5.11)
RDW: 13.2 % (ref 11.5–15.5)
WBC: 6.3 10*3/uL (ref 4.0–10.5)

## 2017-12-01 LAB — I-STAT TROPONIN, ED: Troponin i, poc: 0 ng/mL (ref 0.00–0.08)

## 2017-12-01 MED ORDER — ACETAMINOPHEN 325 MG PO TABS: 650.0000 mg | ORAL_TABLET | ORAL | Status: DC | PRN

## 2017-12-01 MED ORDER — ASPIRIN EC 325 MG PO TBEC: 325.0000 mg | DELAYED_RELEASE_TABLET | Freq: Every day | ORAL | Status: DC

## 2017-12-01 MED ORDER — ONDANSETRON HCL 4 MG/2ML IJ SOLN: 4.0000 mg | Freq: Four times a day (QID) | INTRAMUSCULAR | Status: DC | PRN

## 2017-12-01 MED ORDER — ENOXAPARIN SODIUM 40 MG/0.4ML ~~LOC~~ SOLN: 40.0000 mg | SUBCUTANEOUS | Status: DC

## 2017-12-01 MED ORDER — GI COCKTAIL ~~LOC~~: 30.0000 mL | Freq: Four times a day (QID) | ORAL | Status: DC | PRN

## 2017-12-01 MED ORDER — SODIUM CHLORIDE 0.9 % IV BOLUS
500.0000 mL | Freq: Once | INTRAVENOUS | Status: AC
  Administered 2017-12-01: 500 mL via INTRAVENOUS

## 2017-12-01 MED ORDER — MORPHINE SULFATE (PF) 4 MG/ML IV SOLN: 1.0000 mg | INTRAVENOUS | Status: DC | PRN

## 2017-12-01 MED ORDER — ALPRAZOLAM 0.25 MG PO TABS: 0.2500 mg | ORAL_TABLET | Freq: Two times a day (BID) | ORAL | Status: DC | PRN

## 2017-12-01 MED ORDER — HYDROCODONE-ACETAMINOPHEN 5-325 MG PO TABS: 1.0000 | ORAL_TABLET | ORAL | Status: DC | PRN

## 2017-12-01 MED ORDER — IOPAMIDOL (ISOVUE-370) INJECTION 76%
INTRAVENOUS | Status: AC
  Administered 2017-12-01: 100 mL
  Filled 2017-12-01: qty 100

## 2017-12-01 MED ORDER — VENLAFAXINE HCL ER 37.5 MG PO CP24: 37.5000 mg | ORAL_CAPSULE | Freq: Two times a day (BID) | ORAL | Status: DC

## 2017-12-01 NOTE — ED Provider Notes (Addendum)
[facility] Provider Note   CSN: [id number] Arrival date & time: 12/01/17  0941     History   Chief Complaint Chief Complaint  Patient presents with  . Chest Pain    HPI Monique Hunt is a 57 y.o. female.  Patient with a past history of hypertension presents with acute onset of chest pain with radiation to the right neck and shortness of breath approximately 2 hours ago.  Symptoms started while patient was mowing her yard with a push mower.  She felt normal when she started this task.  She had to stop mowing and found her way inside and called 911.  Patient was given aspirin and nitroglycerin x2 by EMS.  Symptoms are gradually improving overall.  No vision changes or strokelike symptoms.  She has some tingling in her extremities but no numbness or weakness.  No abdominal pain.  Pain does not radiate to her back.  No lower extremity swelling or tenderness.  No history of blood clots.  Patient states that she has never had a stress test that she can recall. Patient denies risk factors for pulmonary embolism including: unilateral leg swelling, history of DVT/PE/other blood clots, use of exogenous hormones, recent immobilizations, recent surgery, recent travel (>4hr segment), malignancy, hemoptysis.       Past Medical History:  Diagnosis Date  . Hypertension   . Kidney stone   . Kidney stones   . Migraine   . Migraine     Patient Active Problem List   Diagnosis Date Noted  . Depression 06/04/2012  . Pyelonephritis, acute 09/27/2011  . Nephrolithiasis 09/27/2011  . Hypokalemia 09/27/2011  . Hyponatremia 09/27/2011    Past Surgical History:  Procedure Laterality Date  . LITHOTRIPSY       OB History   None      Home Medications    Prior to Admission medications   Medication Sig Start Date End Date Taking? Authorizing Provider  HYDROcodone-acetaminophen (NORCO/VICODIN) 5-325 MG per tablet Take 1 tablet by mouth every 4 (four)  hours as needed for pain. 11/12/12   Charlann Lange, PA-C  omeprazole (PRILOSEC) 40 MG capsule Take 40 mg by mouth daily as needed (heartburn).    [provider]  ondansetron (ZOFRAN ODT) 4 MG disintegrating tablet Take 1 tablet (4 mg total) by mouth every 8 (eight) hours as needed for nausea or vomiting. 09/18/14   Ward, Delice Bison, DO  ondansetron (ZOFRAN) 4 MG tablet Take 1 tablet (4 mg total) by mouth every 6 (six) hours. Patient not taking: Reported on 09/19/2014 11/12/12   Charlann Lange, PA-C  oseltamivir (TAMIFLU) 75 MG capsule Take 1 capsule (75 mg total) by mouth every 12 (twelve) hours. Patient not taking: Reported on 09/19/2014 09/18/14   Ward, Delice Bison, DO  venlafaxine XR (EFFEXOR-XR) 37.5 MG 24 hr capsule Take 37.5 mg by mouth 2 (two) times daily.    [provider]    Family History No family history on file.  Social History Social History   Tobacco Use  . Smoking status: Former Research scientist (life sciences)  . Smokeless tobacco: Never Used  Substance Use Topics  . Alcohol use: Yes    Comment: occassionally  . Drug use: No     Allergies   Patient has no known allergies.   Review of Systems Review of Systems  Constitutional: Negative for diaphoresis and fever.  Eyes: Negative for redness.  Respiratory: Positive for shortness of breath. Negative for cough.   Cardiovascular: Positive for  chest pain. Negative for palpitations and leg swelling.  Gastrointestinal: Negative for abdominal pain, nausea and vomiting.  Genitourinary: Negative for dysuria.  Musculoskeletal: Positive for neck pain. Negative for back pain.  Skin: Negative for rash.  Neurological: Negative for syncope and light-headedness.  Psychiatric/Behavioral: The patient is not nervous/anxious.      Physical Exam Updated Vital Signs Pulse 90   Temp 97.6 F (36.4 C)   Resp (!) 34   Ht 5' 5.5" (1.664 m)   Wt 63.5 kg (140 lb)   LMP 02/28/2011   SpO2 100%   BMI 22.94 kg/m   Physical Exam    Constitutional: She appears well-developed and well-nourished.  HENT:  Head: Normocephalic and atraumatic.  Mouth/Throat: Mucous membranes are normal. Mucous membranes are not dry.  Eyes: Conjunctivae are normal.  Neck: Trachea normal and normal range of motion. Neck supple. Normal carotid pulses and no JVD present. No muscular tenderness present. Carotid bruit is not present. No tracheal deviation present.  Cardiovascular: Normal rate, regular rhythm, S1 normal, S2 normal, normal heart sounds and intact distal pulses. Exam reveals no decreased pulses.  No murmur heard. Pulmonary/Chest: Effort normal. Tachypnea noted. No respiratory distress. She has no wheezes. She exhibits no tenderness.  Abdominal: Soft. Normal aorta and bowel sounds are normal. There is no tenderness. There is no rebound and no guarding.  Musculoskeletal: Normal range of motion.  Neurological: She is alert.  Skin: Skin is warm and dry. She is not diaphoretic. No cyanosis. No pallor.  Psychiatric: She has a normal mood and affect.  Nursing note and vitals reviewed.    ED Treatments / Results  Labs (all labs ordered are listed, but only abnormal results are displayed) Labs Reviewed  BASIC METABOLIC PANEL - Abnormal; Notable for the following components:      Result Value   Glucose, Bld 104 (*)    BUN 31 (*)    Creatinine, Ser 1.09 (*)    GFR calc non Af Amer 55 (*)    All other components within normal limits  CBC  I-STAT TROPONIN, ED    EKG EKG Interpretation  Date/Time:  Thursday December 01 2017 09:52:24 EDT Ventricular Rate:  86 PR Interval:    QRS Duration: 64 QT Interval:  487 QTC Calculation: 583 R Axis:   173 Text Interpretation:  Sinus or ectopic atrial rhythm Right ventricular hypertrophy Anterolateral infarct, age indeterminate Prolonged QT interval changed from prior EKG Confirmed by Malvin Johns [phone]) on 12/01/2017 9:56:49 AM   Radiology Dg Chest Portable 1 View  Result Date:  12/01/2017 CLINICAL DATA:  Shortness of breath EXAM: PORTABLE CHEST 1 VIEW COMPARISON:  11/11/2012; 04/07/2011; 08/01/2006 FINDINGS: Grossly unchanged cardiac silhouette and mediastinal contours. No focal airspace opacities. No pleural effusion or pneumothorax. No evidence of edema. No acute osseus abnormalities. Moderate scoliotic curvature of the thoracolumbar spine with dominant cranial component convex the right, incompletely evaluated. IMPRESSION: 1.  No acute cardiopulmonary disease. 2. Moderate scoliotic curvature of the thoracolumbar spine. Electronically Signed   By: Sandi Mariscal M.D.   On: 12/01/2017 10:12   Ct Angio Chest/abd/pel For Dissection W And/or Wo Contrast  Result Date: 12/01/2017 CLINICAL DATA:  Chest and neck pain.  Evaluate for dissection. EXAM: CT ANGIOGRAPHY CHEST, ABDOMEN AND PELVIS TECHNIQUE: Multidetector CT imaging through the chest, abdomen and pelvis was performed using the standard protocol during bolus administration of intravenous contrast. Multiplanar reconstructed images and MIPs were obtained and reviewed to evaluate the vascular anatomy. CONTRAST:  116mL ISOVUE-370 IOPAMIDOL (  ISOVUE-370) INJECTION 76% COMPARISON:  Abdomen and pelvis CT 09/27/2011 FINDINGS: CTA CHEST FINDINGS Cardiovascular: Pre contrast imaging of the chest shows no thoracic aortic aneurysm. No hyperdense crescent within the wall of the thoracic aorta to suggest acute intramural hematoma. Imaging after IV contrast administration reveals no thoracic aortic dissection. There is no large central pulmonary embolus and no evidence for embolic disease in lobar pulmonary arteries to either lung. The heart size is normal. No substantial pericardial effusion. Mediastinum/Nodes: No mediastinal lymphadenopathy. There is no hilar lymphadenopathy. The esophagus has normal imaging features. There is no axillary lymphadenopathy. 2.5 cm left thyroid nodule evident. Lungs/Pleura: The central tracheobronchial airways are patent.  9 mm right middle lobe perifissural nodule is associated with a focal area of fissural thickening (well seen sagittal series 13: Image 39). Lungs otherwise clear. Musculoskeletal: Mild thoracolumbar scoliosis. No worrisome lytic or sclerotic osseous abnormality. Review of the MIP images confirms the above findings. CTA ABDOMEN AND PELVIS FINDINGS VASCULAR Aorta: No aneurysm.  No dissection. Celiac: Widely patent. SMA: Widely patent. Renals: Widely patent bilaterally. No evidence for accessory renal artery. IMA: Widely patent Inflow: Normal. Veins: Not well evaluated due to bolus timing with laminar flow of un opacified blood seen in the external and common iliac arteries bilaterally. Review of the MIP images confirms the above findings. NON-VASCULAR Hepatobiliary: No focal abnormality within the liver parenchyma. There is no evidence for gallstones, gallbladder wall thickening, or pericholecystic fluid. No intrahepatic or extrahepatic biliary dilation. Pancreas: No focal mass lesion. No dilatation of the main duct. No intraparenchymal cyst. No peripancreatic edema. Spleen: No splenomegaly. No focal mass lesion. Adrenals/Urinary Tract: No adrenal nodule or mass. Areas of cortical scarring noted in the kidneys bilaterally. Nonobstructing 2 mm stone identified upper pole right kidney. 17 mm cyst identified upper pole right kidney. Nonobstructing 6 x 10 mm stone noted in the lower pole of the left kidney with a tiny upper pole left renal stone evident. No evidence for hydroureter. The urinary bladder appears normal for the degree of distention. Stomach/Bowel: Stomach is nondistended. No gastric wall thickening. No evidence of outlet obstruction. Duodenum is normally positioned as is the ligament of Treitz. No small bowel wall thickening. No small bowel dilatation. The terminal ileum is normal. The appendix is normal. Diverticuli are seen scattered along the entire length of the colon without CT findings of  diverticulitis. 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 Lymphatic: There is no gastrohepatic or hepatoduodenal ligament lymphadenopathy. No intraperitoneal or retroperitoneal lymphadenopathy. No pelvic sidewall lymphadenopathy. Reproductive: The uterus has normal CT imaging appearance. There is no adnexal mass. Other: No intraperitoneal free fluid. Musculoskeletal: Small umbilical hernia contains only fat. Thoracolumbar scoliosis evident. No worrisome lytic or sclerotic osseous abnormality. Review of the MIP images confirms the above findings. IMPRESSION: 1. 9 mm right middle lobe nodule along the minor fissure with associated focal fissural thickening. Consider one of the following in 3 months for both low-risk and high-risk individuals: (a) repeat chest CT, (b) follow-up PET-CT, or (c) tissue sampling. Given patient age, pulmonary consultation and short-term follow-up PET-CT suggested to ensure that this lesion is not hypermetabolic. This recommendation follows the consensus statement: Guidelines for Management of Incidental Pulmonary Nodules Detected on CT Images: From the Fleischner Society 2017; Radiology 2017; 284:228-243. 2. No thoraco abdominal aortic aneurysm. No dissection of the thoracoabdominal aorta. No clinically significant stenosis of major arterial anatomy in the chest, abdomen, or pelvis. 3. 2.5 cm left thyroid nodule. Thyroid ultrasound recommended to further  characterize. 4. Bilateral nonobstructing renal stones. 5. Colonic diverticulosis without diverticulitis. Electronically Signed   By: Misty Stanley M.D.   On: 12/01/2017 11:24    Procedures Procedures (including critical care time)  Medications Ordered in ED Medications  sodium chloride 0.9 % bolus 500 mL (0 mLs Intravenous Stopped 12/01/17 1117)  iopamidol (ISOVUE-370) 76 % injection (100 mLs  Contrast Given 12/01/17 1052)     Initial Impression / Assessment and Plan / ED Course  I have reviewed the triage  vital signs and the nursing notes.  Pertinent labs & imaging results that were available during my care of the patient were reviewed by me and considered in my medical decision making (see chart for details).     Patient seen and examined. Work-up initiated. Patient seen with Dr. Tamera Punt. Lungs CTAB. Will r/o dissection and PE with CTA. Troponin 0.00. EKG with some changes but previous was 05/31/2012.   Vital signs reviewed and are as follows: Pulse 90   Temp 97.6 F (36.4 C)   Resp (!) 34   Ht 5' 5.5" (1.664 m)   Wt 63.5 kg (140 lb)   LMP 02/28/2011   SpO2 100%   BMI 22.94 kg/m   11:57 AM Pt updated on CT results including pulmonary and thyroid nodules.   HEART=4 with concerning story. Non-specific t-wave changes since 2014. Will admit to medicine service for further eval. Pt agreeable.   CP improved and is now minimal.   12:17 PM Spoke with hospitalist service who will see patient.   12:33 PM Hospital consult completed -- pt is now refusing to stay for further work-up. Will obtain 3 hr marker at 12:45pm. Plan is to f/u with PCP/cardiology as outpatient.   ED ECG REPORT   Date: 12/01/2017  Rate:79  Rhythm: normal sinus rhythm  QRS Axis: normal  Intervals: normal  ST/T Wave abnormalities: nonspecific T wave changes  Conduction Disutrbances:none  Narrative Interpretation:   Old EKG Reviewed: unchanged  I have personally reviewed the EKG tracing and agree with the computerized printout as noted.  1:55 PM Repeat trop neg. Pt ready for home. Referrals given.   Final Clinical Impressions(s) / ED Diagnoses   Final diagnoses:  Precordial pain  Abnormal EKG  Pulmonary nodule  Thyroid nodule   Admit for moderate risk CP.   Pt refusing admission. Plan as above/per hosp consult note.   ED Discharge Orders    None       Carlisle Cater, PA-C 12/01/17 1217    Carlisle Cater, PA-C 12/01/17 1355    Malvin Johns, MD 12/01/17 1451

## 2017-12-01 NOTE — ED Notes (Signed)
Pt updated on status of labwork. RN placed call to lab to get ETA on trop. Lab reported 5-10 mins until test results.

## 2017-12-01 NOTE — Discharge Instructions (Signed)
Please read and follow all provided instructions.  Your diagnoses today include:  1. Precordial pain   2. Abnormal EKG   3. Pulmonary nodule   4. Thyroid nodule    Tests performed today include:  An EKG of your heart  A chest x-ray  Cardiac enzymes - a blood test for heart muscle damage  Blood counts and electrolytes  Vital signs. See below for your results today.   Medications prescribed:   None  Take any prescribed medications only as directed.  Follow-up instructions: We want you to stay in the hospital. However, please follow-up with your primary care provider and cardiologist listed as soon as you can for further evaluation of your symptoms.   Return instructions:  SEEK IMMEDIATE MEDICAL ATTENTION IF:  You have severe chest pain, especially if the pain is crushing or pressure-like and spreads to the arms, back, neck, or jaw, or if you have sweating, nausea (feeling sick to your stomach), or shortness of breath. THIS IS AN EMERGENCY. Don't wait to see if the pain will go away. Get medical help at once. Call 911 or 0 (operator). DO NOT drive yourself to the hospital.   Your chest pain gets worse and does not go away with rest.   You have an attack of chest pain lasting longer than usual, despite rest and treatment with the medications your caregiver has prescribed.   You wake from sleep with chest pain or shortness of breath.  You feel dizzy or faint.  You have chest pain not typical of your usual pain for which you originally saw your caregiver.   You have any other emergent concerns regarding your health.   Your vital signs today were: BP 104/90    Pulse 81    Temp 97.6 F (36.4 C)    Resp 15    Ht 5' 5.5" (1.664 m)    Wt 63.5 kg (140 lb)    LMP 02/28/2011    SpO2 100%    BMI 22.94 kg/m  If your blood pressure (BP) was elevated above 135/85 this visit, please have this repeated by your doctor within one month. --------------

## 2017-12-01 NOTE — Consult Note (Addendum)
Expand All Collapse All        Medical Consultation    Monique Hunt XBJ:[id number] DOB: 08/31/1960 DOA: 12/01/2017   PCP: Camille Bal, PA-C   Patient coming from:  Home    Chief Complaint: Chest pain   HPI: Monique Hunt is a 57 y.o. female with medical history significant for hypertension, presenting with acute onset of chest pain with radiation to the right neck and accompanied by acute shortness of breath about 2 hours prior to arrival.  The symptoms began when mowing her yard with a push mower.  She reported the pain to radiate slightly to her back, but more to the left side of her chest.  She cannot reproduce this pain.  She had similar symptoms 2 weeks ago, of less intensity.  She called 911, and in transit, she received aspirin and nitroglycerin x2, with resolution of her symptoms.  She denies any history of asthma or COPD.  She denies any vision changes, or strokelike symptoms.  Patient denies any numbness or unilateral weakness.  No abdominal pain nausea or vomiting.  She denies any history of blood clots.  The patient reports that never had been seen by a cardiologist, or had a stress test.  She denies any unilateral leg swelling, history of DVT or PE, hormonal replacement, recent immobilization or surgery, or recent travel greater than 4 hours.  She denies any history of malignancy or hemoptysis.  He quit tobacco several years ago, she denies any alcohol or recreational drug use.  She denies any significant amount of caffeine.  She denies any new stresses.  She denies any bleeding issues.   ED Course:  BP 122/80   Pulse 72   Temp 97.6 F (36.4 C)   Resp 20   Ht 5' 5.5" (1.664 m)   Wt 63.5 kg (140 lb)   LMP 02/28/2011   SpO2 99%   BMI 22.94 kg/m    Sodium 136, potassium 3.9, bicarb 23, BUN 31, creatinine 1.09, troponin 0, white count 6.3, hemoglobin 13.7, platelets 330,  CT angio negative for PE, there is a small lung nodule of 1.9 mm in the right middle  lobe with some focal fissural thickening, and a 2.5 cm left thyroid nodule noted,  EKG sinus rhythm without acute findings. EKGSinus or ectopic atrial rhythm Right ventricular hypertrophy, change in the anterolateral area of EKG, prolonged QT interval changed from prior EKG  Heart score is 4 Chest x-ray was negative for acute cardiopulmonary disease, there is moderate scoliosis.  Review of Systems:  As per HPI otherwise all other systems reviewed and are negative      Past Medical History:  Diagnosis Date  . Hypertension   . Kidney stone   . Kidney stones   . Migraine   . Migraine          Past Surgical History:  Procedure Laterality Date  . LITHOTRIPSY    . NO PAST SURGERIES      Social History Social History        Socioeconomic History  . Marital status: Married    Spouse name: Not on file  . Number of children: Not on file  . Years of education: Not on file  . Highest education level: Not on file  Occupational History  . Not on file  Social Needs  . Financial resource strain: Not on file  . Food insecurity:    Worry: Not on file    Inability: Not on file  .  Transportation needs:    Medical: Not on file    Non-medical: Not on file  Tobacco Use  . Smoking status: Former Research scientist (life sciences)  . Smokeless tobacco: Never Used  Substance and Sexual Activity  . Alcohol use: Yes    Comment: occassionally  . Drug use: No  . Sexual activity: Yes    Birth control/protection: Pill  Lifestyle  . Physical activity:    Days per week: Not on file    Minutes per session: Not on file  . Stress: Not on file  Relationships  . Social connections:    Talks on phone: Not on file    Gets together: Not on file    Attends religious service: Not on file    Active member of club or organization: Not on file    Attends meetings of clubs or organizations: Not on file    Relationship status: Not on file  . Intimate partner violence:     Fear of current or ex partner: Not on file    Emotionally abused: Not on file    Physically abused: Not on file    Forced sexual activity: Not on file  Other Topics Concern  . Not on file  Social History Narrative  . Not on file     No Known Allergies  History reviewed. No pertinent family history.            Prior to Admission medications   Medication Sig Start Date End Date Taking? Authorizing Provider  HYDROcodone-acetaminophen (NORCO/VICODIN) 5-325 MG per tablet Take 1 tablet by mouth every 4 (four) hours as needed for pain. 11/12/12   Charlann Lange, PA-C  omeprazole (PRILOSEC) 40 MG capsule Take 40 mg by mouth daily as needed (heartburn).    [provider]  ondansetron (ZOFRAN ODT) 4 MG disintegrating tablet Take 1 tablet (4 mg total) by mouth every 8 (eight) hours as needed for nausea or vomiting. 09/18/14   Ward, Delice Bison, DO  venlafaxine XR (EFFEXOR-XR) 37.5 MG 24 hr capsule Take 37.5 mg by mouth 2 (two) times daily.    [provider]     Physical Exam:        Vitals:   12/01/17 1000 12/01/17 1015 12/01/17 1030 12/01/17 1045  BP: 100/68 109/71 (!) 121/107 122/80  Pulse: 92 86 78 72  Resp: (!) 24 18 16 20   Temp:      SpO2: 100% 100% 100% 99%  Weight:      Height:       Constitutional: NAD, calm, comfortable after taking aspirin and nitroglycerin, she is chest pain-free at this time Eyes: PERRL, lids and conjunctivae normal.  Mild exophthalmia ENMT: Mucous membranes are moist, without exudate or lesions  Neck: normal, supple, no masses, no thyromegaly.  Cannot palpate the known thyroid nodule Respiratory: clear to auscultation bilaterally, no wheezing, no crackles. Normal respiratory effort  Cardiovascular: Regular rate and rhythm, very soft 1 out of 6 murmur, rubs or gallops. No extremity edema. 2+ pedal pulses. No carotid bruits.  Abdomen: Soft, non tender, No hepatosplenomegaly. Bowel sounds positive.    Musculoskeletal: no clubbing / cyanosis. Moves all extremities Skin: no jaundice, No lesions.  Neurologic: Sensation intact  Strength equal in all extremities Psychiatric:   Alert and oriented x 3. anxious mood.     Labs on Admission: I have personally reviewed following labs and imaging studies  CBC: Last[facility]abs     Recent Labs  Lab 12/01/17 0953  WBC 6.3  HGB 13.7  HCT  40.8  MCV 95.1  PLT 330      Basic Metabolic Panel: Last[facility]     Recent Labs  Lab 12/01/17 0953  NA 136  K 3.9  CL 101  CO2 23  GLUCOSE 104*  BUN 31*  CREATININE 1.09*  CALCIUM 9.9      GFR: Estimated Creatinine Clearance: 52.3 mL/min (A) (by C-G formula based on SCr of 1.09 mg/dL (H)).  Liver Function Tests: Last[facility]  No results for input(s): AST, ALT, ALKPHOS, BILITOT, PROT, ALBUMIN in the last 168 hours.   [facility]  No results for input(s): LIPASE, AMYLASE in the last 168 hours.   Last[facility]  No results for input(s): AMMONIA in the last 168 hours.    Coagulation Profile: [facility]  No results for input(s): INR, PROTIME in the last 168 hours.    Cardiac Enzymes: Last[facility]  No results for input(s): CKTOTAL, CKMB, CKMBINDEX, TROPONINI in the last 168 hours.    BNP (last 3 results) RecentLabs(withinlast365days)  No results for input(s): PROBNP in the last 8760 hours.    HbA1C: RecentLabs(last2labs)  No results for input(s): HGBA1C in the last 72 hours.    CBG: Last[facility]  No results for input(s): GLUCAP in the last 168 hours.    Lipid Profile: RecentLabs(last2labs)  No results for input(s): CHOL, HDL, LDLCALC, TRIG, CHOLHDL, LDLDIRECT in the last 72 hours.    Thyroid Function Tests: Recent[facility](last2labs)  No results for input(s): TSH, T4TOTAL, FREET4, T3FREE, THYROIDAB in the last 72 hours.    Anemia Panel: Recent[facility](last2labs)  No results for input(s): VITAMINB12, FOLATE, FERRITIN, TIBC, IRON, RETICCTPCT in  the last 72 hours.    Urine analysis: Labs(Brief)          Component Value Date/Time   COLORURINE YELLOW 11/11/2012 2313   APPEARANCEUR CLOUDY (A) 11/11/2012 2313   LABSPEC 1.021 11/11/2012 2313   PHURINE 6.0 11/11/2012 2313   GLUCOSEU NEGATIVE 11/11/2012 2313   HGBUR SMALL (A) 11/11/2012 2313   BILIRUBINUR NEGATIVE 11/11/2012 2313   KETONESUR NEGATIVE 11/11/2012 2313   PROTEINUR NEGATIVE 11/11/2012 2313   UROBILINOGEN 1.0 11/11/2012 2313   NITRITE NEGATIVE 11/11/2012 2313   LEUKOCYTESUR TRACE (A) 11/11/2012 2313      Sepsis Labs: @LABRCNTIP (procalcitonin:4,lacticidven:4) )No results found for this or any previous visit (from the past 240 hour(s)).   Radiological Exams on Admission:  ImagingResults(Last48hours)  Dg Chest Portable 1 View  Result Date: 12/01/2017 CLINICAL DATA:  Shortness of breath EXAM: PORTABLE CHEST 1 VIEW COMPARISON:  11/11/2012; 04/07/2011; 08/01/2006 FINDINGS: Grossly unchanged cardiac silhouette and mediastinal contours. No focal airspace opacities. No pleural effusion or pneumothorax. No evidence of edema. No acute osseus abnormalities. Moderate scoliotic curvature of the thoracolumbar spine with dominant cranial component convex the right, incompletely evaluated. IMPRESSION: 1.  No acute cardiopulmonary disease. 2. Moderate scoliotic curvature of the thoracolumbar spine. Electronically Signed   By: Sandi Mariscal M.D.   On: 12/01/2017 10:12   Ct Angio Chest/abd/pel For Dissection W And/or Wo Contrast  Result Date: 12/01/2017 CLINICAL DATA:  Chest and neck pain.  Evaluate for dissection. EXAM: CT ANGIOGRAPHY CHEST, ABDOMEN AND PELVIS TECHNIQUE: Multidetector CT imaging through the chest, abdomen and pelvis was performed using the standard protocol during bolus administration of intravenous contrast. Multiplanar reconstructed images and MIPs were obtained and reviewed to evaluate the vascular anatomy. CONTRAST:  122mL ISOVUE-370 IOPAMIDOL  (ISOVUE-370) INJECTION 76% COMPARISON:  Abdomen and pelvis CT 09/27/2011 FINDINGS: CTA CHEST FINDINGS Cardiovascular: Pre contrast imaging of the chest shows no thoracic aortic aneurysm. No hyperdense  crescent within the wall of the thoracic aorta to suggest acute intramural hematoma. Imaging after IV contrast administration reveals no thoracic aortic dissection. There is no large central pulmonary embolus and no evidence for embolic disease in lobar pulmonary arteries to either lung. The heart size is normal. No substantial pericardial effusion. Mediastinum/Nodes: No mediastinal lymphadenopathy. There is no hilar lymphadenopathy. The esophagus has normal imaging features. There is no axillary lymphadenopathy. 2.5 cm left thyroid nodule evident. Lungs/Pleura: The central tracheobronchial airways are patent. 9 mm right middle lobe perifissural nodule is associated with a focal area of fissural thickening (well seen sagittal series 13: Image 39). Lungs otherwise clear. Musculoskeletal: Mild thoracolumbar scoliosis. No worrisome lytic or sclerotic osseous abnormality. Review of the MIP images confirms the above findings. CTA ABDOMEN AND PELVIS FINDINGS VASCULAR Aorta: No aneurysm.  No dissection. Celiac: Widely patent. SMA: Widely patent. Renals: Widely patent bilaterally. No evidence for accessory renal artery. IMA: Widely patent Inflow: Normal. Veins: Not well evaluated due to bolus timing with laminar flow of un opacified blood seen in the external and common iliac arteries bilaterally. Review of the MIP images confirms the above findings. NON-VASCULAR Hepatobiliary: No focal abnormality within the liver parenchyma. There is no evidence for gallstones, gallbladder wall thickening, or pericholecystic fluid. No intrahepatic or extrahepatic biliary dilation. Pancreas: No focal mass lesion. No dilatation of the main duct. No intraparenchymal cyst. No peripancreatic edema. Spleen: No splenomegaly. No focal mass lesion.  Adrenals/Urinary Tract: No adrenal nodule or mass. Areas of cortical scarring noted in the kidneys bilaterally. Nonobstructing 2 mm stone identified upper pole right kidney. 17 mm cyst identified upper pole right kidney. Nonobstructing 6 x 10 mm stone noted in the lower pole of the left kidney with a tiny upper pole left renal stone evident. No evidence for hydroureter. The urinary bladder appears normal for the degree of distention. Stomach/Bowel: Stomach is nondistended. No gastric wall thickening. No evidence of outlet obstruction. Duodenum is normally positioned as is the ligament of Treitz. No small bowel wall thickening. No small bowel dilatation. The terminal ileum is normal. The appendix is normal. Diverticuli are seen scattered along the entire length of the colon without CT findings of diverticulitis. 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 Lymphatic: There is no gastrohepatic or hepatoduodenal ligament lymphadenopathy. No intraperitoneal or retroperitoneal lymphadenopathy. No pelvic sidewall lymphadenopathy. Reproductive: The uterus has normal CT imaging appearance. There is no adnexal mass. Other: No intraperitoneal free fluid. Musculoskeletal: Small umbilical hernia contains only fat. Thoracolumbar scoliosis evident. No worrisome lytic or sclerotic osseous abnormality. Review of the MIP images confirms the above findings. IMPRESSION: 1. 9 mm right middle lobe nodule along the minor fissure with associated focal fissural thickening. Consider one of the following in 3 months for both low-risk and high-risk individuals: (a) repeat chest CT, (b) follow-up PET-CT, or (c) tissue sampling. Given patient age, pulmonary consultation and short-term follow-up PET-CT suggested to ensure that this lesion is not hypermetabolic. This recommendation follows the consensus statement: Guidelines for Management of Incidental Pulmonary Nodules Detected on CT Images: From the Fleischner Society 2017;  Radiology 2017; 284:228-243. 2. No thoraco abdominal aortic aneurysm. No dissection of the thoracoabdominal aorta. No clinically significant stenosis of major arterial anatomy in the chest, abdomen, or pelvis. 3. 2.5 cm left thyroid nodule. Thyroid ultrasound recommended to further characterize. 4. Bilateral nonobstructing renal stones. 5. Colonic diverticulosis without diverticulitis. Electronically Signed   By: Misty Stanley M.D.   On: 12/01/2017 11:24  EKG: Independently reviewed.  Assessment/Plan Principal Problem:   Chest pain syndrome Active Problems:   Depression   Hypertension   Former tobacco use   Chest pain syndrome, heart score 4.  Troponin negative, EKG with sinus or ectopic atrial rhythm, right ventricular hypertrophy, and changes in the anterolateral area, which is changed from her prior EKG 5 years ago.  Patient received aspirin and nitroglycerin, and she is chest pain-free at this time.  Chest x-ray NAD, she had a CT angio which was negative for PE or DVT or dissection.  No prior history of cardiac disease.  The patient has never had a cardiology evaluation, or a cardiac catheterization.  However, the patient was instructed to be admitted, for further evaluation and management of her symptoms, in view of 2 other episodes that they were similar, of less intensity in the recent past which places her at a higher risk of MI .  She is adamant to leave AMA. THus , the patient has been instructed as soon as possible to follow-up with PCP, and to be referred to Cardiology as an outpatient, for further studies, which may include stress test.   Hypertension BP 122/80   Pulse 72 ,not on meds  Continue home anti-hypertensive medications  Folllow with PCP   Depression Continue home  Prozac   Pulmonary nodule, lung nodule.  These nodules are incidental per CT angio of the chest.  Remote history of tobacco abuse Patient will need to follow-up with CT as outpatient  Acute Kidney  Injury likely due to dehydration, patient admits to low oral liquid intake Cr 1.09  RecentLabs       Lab Results  Component Value Date   CREATININE 1.09 (H) 12/01/2017   CREATININE 0.63 11/11/2012   CREATININE 0.66 05/30/2012    Instructed to push some oral fluids     Sharene Butters, PA-C [facility]   Amion text  [id number]   12/01/2017, 12:25 PM

## 2017-12-01 NOTE — H&P (Deleted)
Medical Consultation    Monique Hunt:[id number] DOB: 1960-12-25 DOA: 12/01/2017   PCP: Camille Bal, PA-C   Patient coming from:  Home    Chief Complaint: Chest pain   HPI: Monique Hunt is a 57 y.o. female with medical history significant for hypertension, presenting with acute onset of chest pain with radiation to the right neck and accompanied by acute shortness of breath about 2 hours prior to arrival.  The symptoms began when mowing her yard with a push mower.  She reported the pain to radiate slightly to her back, but more to the left side of her chest.  She cannot reproduce this pain.  She had similar symptoms 2 weeks ago, of less intensity.  She called 911, and in transit, she received aspirin and nitroglycerin x2, with resolution of her symptoms.  She denies any history of asthma or COPD.  She denies any vision changes, or strokelike symptoms.  Patient denies any numbness or unilateral weakness.  No abdominal pain nausea or vomiting.  She denies any history of blood clots.  The patient reports that never had been seen by a cardiologist, or had a stress test.  She denies any unilateral leg swelling, history of DVT or PE, hormonal replacement, recent immobilization or surgery, or recent travel greater than 4 hours.  She denies any history of malignancy or hemoptysis.  He quit tobacco several years ago, she denies any alcohol or recreational drug use.  She denies any significant amount of caffeine.  She denies any new stresses.  She denies any bleeding issues.   ED Course:  BP 122/80   Pulse 72   Temp 97.6 F (36.4 C)   Resp 20   Ht 5' 5.5" (1.664 m)   Wt 63.5 kg (140 lb)   LMP 02/28/2011   SpO2 99%   BMI 22.94 kg/m    Sodium 136, potassium 3.9, bicarb 23, BUN 31, creatinine 1.09, troponin 0, white count 6.3, hemoglobin 13.7, platelets 330,  CT angio negative for PE, there is a small lung nodule of 1.9 mm in the right middle lobe with some focal fissural thickening,  and a 2.5 cm left thyroid nodule noted,  EKG sinus rhythm without acute findings.  EKG   Sinus or ectopic atrial rhythm Right ventricular hypertrophy, change in the anterolateral area of EKG, prolonged QT interval changed from prior EKG  Heart score is 4 Chest x-ray was negative for acute cardiopulmonary disease, there is moderate scoliosis.  Review of Systems:  As per HPI otherwise all other systems reviewed and are negative  Past Medical History:  Diagnosis Date  . Hypertension   . Kidney stone   . Kidney stones   . Migraine   . Migraine     Past Surgical History:  Procedure Laterality Date  . LITHOTRIPSY    . NO PAST SURGERIES      Social History Social History   Socioeconomic History  . Marital status: Married    Spouse name: Not on file  . Number of children: Not on file  . Years of education: Not on file  . Highest education level: Not on file  Occupational History  . Not on file  Social Needs  . Financial resource strain: Not on file  . Food insecurity:    Worry: Not on file    Inability: Not on file  . Transportation needs:    Medical: Not on file    Non-medical: Not on file  Tobacco Use  .  Smoking status: Former Research scientist (life sciences)  . Smokeless tobacco: Never Used  Substance and Sexual Activity  . Alcohol use: Yes    Comment: occassionally  . Drug use: No  . Sexual activity: Yes    Birth control/protection: Pill  Lifestyle  . Physical activity:    Days per week: Not on file    Minutes per session: Not on file  . Stress: Not on file  Relationships  . Social connections:    Talks on phone: Not on file    Gets together: Not on file    Attends religious service: Not on file    Active member of club or organization: Not on file    Attends meetings of clubs or organizations: Not on file    Relationship status: Not on file  . Intimate partner violence:    Fear of current or ex partner: Not on file    Emotionally abused: Not on file    Physically abused: Not on  file    Forced sexual activity: Not on file  Other Topics Concern  . Not on file  Social History Narrative  . Not on file     No Known Allergies  History reviewed. No pertinent family history.     Prior to Admission medications   Medication Sig Start Date End Date Taking? Authorizing Provider  HYDROcodone-acetaminophen (NORCO/VICODIN) 5-325 MG per tablet Take 1 tablet by mouth every 4 (four) hours as needed for pain. 11/12/12   Charlann Lange, PA-C  omeprazole (PRILOSEC) 40 MG capsule Take 40 mg by mouth daily as needed (heartburn).    [provider]  ondansetron (ZOFRAN ODT) 4 MG disintegrating tablet Take 1 tablet (4 mg total) by mouth every 8 (eight) hours as needed for nausea or vomiting. 09/18/14   Ward, Delice Bison, DO  venlafaxine XR (EFFEXOR-XR) 37.5 MG 24 hr capsule Take 37.5 mg by mouth 2 (two) times daily.    [provider]     Physical Exam:  Vitals:   12/01/17 1000 12/01/17 1015 12/01/17 1030 12/01/17 1045  BP: 100/68 109/71 (!) 121/107 122/80  Pulse: 92 86 78 72  Resp: (!) 24 18 16 20   Temp:      SpO2: 100% 100% 100% 99%  Weight:      Height:       Constitutional: NAD, calm, comfortable after taking aspirin and nitroglycerin, she is chest pain-free at this time Eyes: PERRL, lids and conjunctivae normal.  Mild exophthalmia ENMT: Mucous membranes are moist, without exudate or lesions  Neck: normal, supple, no masses, no thyromegaly.  Cannot palpate the known thyroid nodule Respiratory: clear to auscultation bilaterally, no wheezing, no crackles. Normal respiratory effort  Cardiovascular: Regular rate and rhythm, very soft 1 out of 6 murmur, rubs or gallops. No extremity edema. 2+ pedal pulses. No carotid bruits.  Abdomen: Soft, non tender, No hepatosplenomegaly. Bowel sounds positive.  Musculoskeletal: no clubbing / cyanosis. Moves all extremities Skin: no jaundice, No lesions.  Neurologic: Sensation intact  Strength equal in all  extremities Psychiatric:   Alert and oriented x 3. anxious mood.     Labs on Admission: I have personally reviewed following labs and imaging studies  CBC: Recent Labs  Lab 12/01/17 0953  WBC 6.3  HGB 13.7  HCT 40.8  MCV 95.1  PLT 962    Basic Metabolic Panel: Recent Labs  Lab 12/01/17 0953  NA 136  K 3.9  CL 101  CO2 23  GLUCOSE 104*  BUN 31*  CREATININE 1.09*  CALCIUM 9.9    GFR: Estimated Creatinine Clearance: 52.3 mL/min (A) (by C-G formula based on SCr of 1.09 mg/dL (H)).  Liver Function Tests: No results for input(s): AST, ALT, ALKPHOS, BILITOT, PROT, ALBUMIN in the last 168 hours. No results for input(s): LIPASE, AMYLASE in the last 168 hours. No results for input(s): AMMONIA in the last 168 hours.  Coagulation Profile: No results for input(s): INR, PROTIME in the last 168 hours.  Cardiac Enzymes: No results for input(s): CKTOTAL, CKMB, CKMBINDEX, TROPONINI in the last 168 hours.  BNP (last 3 results) No results for input(s): PROBNP in the last 8760 hours.  HbA1C: No results for input(s): HGBA1C in the last 72 hours.  CBG: No results for input(s): GLUCAP in the last 168 hours.  Lipid Profile: No results for input(s): CHOL, HDL, LDLCALC, TRIG, CHOLHDL, LDLDIRECT in the last 72 hours.  Thyroid Function Tests: No results for input(s): TSH, T4TOTAL, FREET4, T3FREE, THYROIDAB in the last 72 hours.  Anemia Panel: No results for input(s): VITAMINB12, FOLATE, FERRITIN, TIBC, IRON, RETICCTPCT in the last 72 hours.  Urine analysis:    Component Value Date/Time   COLORURINE YELLOW 11/11/2012 2313   APPEARANCEUR CLOUDY (A) 11/11/2012 2313   LABSPEC 1.021 11/11/2012 2313   PHURINE 6.0 11/11/2012 2313   GLUCOSEU NEGATIVE 11/11/2012 2313   HGBUR SMALL (A) 11/11/2012 2313   BILIRUBINUR NEGATIVE 11/11/2012 2313   KETONESUR NEGATIVE 11/11/2012 2313   PROTEINUR NEGATIVE 11/11/2012 2313   UROBILINOGEN 1.0 11/11/2012 2313   NITRITE NEGATIVE 11/11/2012  2313   LEUKOCYTESUR TRACE (A) 11/11/2012 2313    Sepsis Labs: @LABRCNTIP (procalcitonin:4,lacticidven:4) )No results found for this or any previous visit (from the past 240 hour(s)).   Radiological Exams on Admission: Dg Chest Portable 1 View  Result Date: 12/01/2017 CLINICAL DATA:  Shortness of breath EXAM: PORTABLE CHEST 1 VIEW COMPARISON:  11/11/2012; 04/07/2011; 08/01/2006 FINDINGS: Grossly unchanged cardiac silhouette and mediastinal contours. No focal airspace opacities. No pleural effusion or pneumothorax. No evidence of edema. No acute osseus abnormalities. Moderate scoliotic curvature of the thoracolumbar spine with dominant cranial component convex the right, incompletely evaluated. IMPRESSION: 1.  No acute cardiopulmonary disease. 2. Moderate scoliotic curvature of the thoracolumbar spine. Electronically Signed   By: Sandi Mariscal M.D.   On: 12/01/2017 10:12   Ct Angio Chest/abd/pel For Dissection W And/or Wo Contrast  Result Date: 12/01/2017 CLINICAL DATA:  Chest and neck pain.  Evaluate for dissection. EXAM: CT ANGIOGRAPHY CHEST, ABDOMEN AND PELVIS TECHNIQUE: Multidetector CT imaging through the chest, abdomen and pelvis was performed using the standard protocol during bolus administration of intravenous contrast. Multiplanar reconstructed images and MIPs were obtained and reviewed to evaluate the vascular anatomy. CONTRAST:  181mL ISOVUE-370 IOPAMIDOL (ISOVUE-370) INJECTION 76% COMPARISON:  Abdomen and pelvis CT 09/27/2011 FINDINGS: CTA CHEST FINDINGS Cardiovascular: Pre contrast imaging of the chest shows no thoracic aortic aneurysm. No hyperdense crescent within the wall of the thoracic aorta to suggest acute intramural hematoma. Imaging after IV contrast administration reveals no thoracic aortic dissection. There is no large central pulmonary embolus and no evidence for embolic disease in lobar pulmonary arteries to either lung. The heart size is normal. No substantial pericardial  effusion. Mediastinum/Nodes: No mediastinal lymphadenopathy. There is no hilar lymphadenopathy. The esophagus has normal imaging features. There is no axillary lymphadenopathy. 2.5 cm left thyroid nodule evident. Lungs/Pleura: The central tracheobronchial airways are patent. 9 mm right middle lobe perifissural nodule is associated with a focal area of fissural thickening (well seen sagittal series 13:  Image 39). Lungs otherwise clear. Musculoskeletal: Mild thoracolumbar scoliosis. No worrisome lytic or sclerotic osseous abnormality. Review of the MIP images confirms the above findings. CTA ABDOMEN AND PELVIS FINDINGS VASCULAR Aorta: No aneurysm.  No dissection. Celiac: Widely patent. SMA: Widely patent. Renals: Widely patent bilaterally. No evidence for accessory renal artery. IMA: Widely patent Inflow: Normal. Veins: Not well evaluated due to bolus timing with laminar flow of un opacified blood seen in the external and common iliac arteries bilaterally. Review of the MIP images confirms the above findings. NON-VASCULAR Hepatobiliary: No focal abnormality within the liver parenchyma. There is no evidence for gallstones, gallbladder wall thickening, or pericholecystic fluid. No intrahepatic or extrahepatic biliary dilation. Pancreas: No focal mass lesion. No dilatation of the main duct. No intraparenchymal cyst. No peripancreatic edema. Spleen: No splenomegaly. No focal mass lesion. Adrenals/Urinary Tract: No adrenal nodule or mass. Areas of cortical scarring noted in the kidneys bilaterally. Nonobstructing 2 mm stone identified upper pole right kidney. 17 mm cyst identified upper pole right kidney. Nonobstructing 6 x 10 mm stone noted in the lower pole of the left kidney with a tiny upper pole left renal stone evident. No evidence for hydroureter. The urinary bladder appears normal for the degree of distention. Stomach/Bowel: Stomach is nondistended. No gastric wall thickening. No evidence of outlet obstruction.  Duodenum is normally positioned as is the ligament of Treitz. No small bowel wall thickening. No small bowel dilatation. The terminal ileum is normal. The appendix is normal. Diverticuli are seen scattered along the entire length of the colon without CT findings of diverticulitis. 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 Lymphatic: There is no gastrohepatic or hepatoduodenal ligament lymphadenopathy. No intraperitoneal or retroperitoneal lymphadenopathy. No pelvic sidewall lymphadenopathy. Reproductive: The uterus has normal CT imaging appearance. There is no adnexal mass. Other: No intraperitoneal free fluid. Musculoskeletal: Small umbilical hernia contains only fat. Thoracolumbar scoliosis evident. No worrisome lytic or sclerotic osseous abnormality. Review of the MIP images confirms the above findings. IMPRESSION: 1. 9 mm right middle lobe nodule along the minor fissure with associated focal fissural thickening. Consider one of the following in 3 months for both low-risk and high-risk individuals: (a) repeat chest CT, (b) follow-up PET-CT, or (c) tissue sampling. Given patient age, pulmonary consultation and short-term follow-up PET-CT suggested to ensure that this lesion is not hypermetabolic. This recommendation follows the consensus statement: Guidelines for Management of Incidental Pulmonary Nodules Detected on CT Images: From the Fleischner Society 2017; Radiology 2017; 284:228-243. 2. No thoraco abdominal aortic aneurysm. No dissection of the thoracoabdominal aorta. No clinically significant stenosis of major arterial anatomy in the chest, abdomen, or pelvis. 3. 2.5 cm left thyroid nodule. Thyroid ultrasound recommended to further characterize. 4. Bilateral nonobstructing renal stones. 5. Colonic diverticulosis without diverticulitis. Electronically Signed   By: Misty Stanley M.D.   On: 12/01/2017 11:24    EKG: Independently reviewed.  Assessment/Plan Principal Problem:   Chest  pain syndrome Active Problems:   Depression   Hypertension   Former tobacco use   Chest pain syndrome, heart score 4.  Troponin negative, EKG with sinus or ectopic atrial rhythm, right ventricular hypertrophy, and changes in the anterolateral area, which is changed from her prior EKG 5 years ago.  Patient received aspirin and nitroglycerin, and she is chest pain-free at this time.  Chest x-ray NAD, she had a CT angio which was negative for PE or DVT or dissection.  No prior history of cardiac disease.  The patient has never  had a cardiology evaluation, or a cardiac catheterization.  However, the patient was instructed to be admitted, for further evaluation and management of her symptoms, in view of 2 other episodes that they were similar, of less intensity in the recent past which places her at a higher risk of MI .  She is adamant to leave AMA. THus , the patient has been instructed as soon as possible to follow-up with PCP, and to be referred to Cardiology as an outpatient, for further studies, which may include stress test.   Hypertension BP 122/80   Pulse 72 ,not on meds  Continue home anti-hypertensive medications  Folllow with PCP   Depression Continue home  Prozac   Pulmonary nodule, lung nodule.  These nodules are incidental per CT angio of the chest.  Remote history of tobacco abuse Patient will need to follow-up with CT as outpatient  Acute Kidney Injury likely due to dehydration, patient admits to low oral liquid intake Cr 1.09  Lab Results  Component Value Date   CREATININE 1.09 (H) 12/01/2017   CREATININE 0.63 11/11/2012   CREATININE 0.66 05/30/2012  Instructed to push some oral fluids     Sharene Butters, PA-C [facility]   Amion text  [id number]   12/01/2017, 12:25 PM

## 2017-12-01 NOTE — ED Triage Notes (Signed)
To ED via [facility] from home with c/o chest pain radiating to left side of neck. while mowing yard this am -- received NTG x 2 -- reduced pain from 7/10 to 6/10--  Initial BP 120/76  After NTG x 2 -- BP 96/70. Pulse = 104 -- pt is short of breath at present-- no hx of asthma, no hx of copd.

## 2017-12-01 NOTE — ED Notes (Addendum)
Pt also received ASA 324mg  per EMS

## 2017-12-01 NOTE — ED Notes (Signed)
Talked with patient concerning leaving AMA. Frankly discussed her options and patient stated "I feel I can rest better at home".

## 2017-12-01 NOTE — ED Notes (Signed)
Pt requested IV be removed and to be disconnected from monitor as they are leaving AMA. EDP and RN Larkin Ina aware.

## 2017-12-15 ENCOUNTER — Encounter (HOSPITAL_COMMUNITY): Payer: Self-pay | Admitting: *Deleted

## 2017-12-15 ENCOUNTER — Inpatient Hospital Stay (HOSPITAL_COMMUNITY)
Admission: EM | Admit: 2017-12-15 | Discharge: 2017-12-20 | DRG: 247 | Disposition: A | Discharge status: Home / Self Care | Payer: Self-pay | Attending: Internal Medicine | Admitting: Internal Medicine

## 2017-12-15 ENCOUNTER — Emergency Department (HOSPITAL_COMMUNITY): Payer: Self-pay

## 2017-12-15 ENCOUNTER — Other Ambulatory Visit: Payer: Self-pay

## 2017-12-15 DIAGNOSIS — R911 Solitary pulmonary nodule: Secondary | ICD-10-CM | POA: Diagnosis present

## 2017-12-15 DIAGNOSIS — I214 Non-ST elevation (NSTEMI) myocardial infarction: Principal | ICD-10-CM | POA: Diagnosis present

## 2017-12-15 DIAGNOSIS — E785 Hyperlipidemia, unspecified: Secondary | ICD-10-CM | POA: Diagnosis present

## 2017-12-15 DIAGNOSIS — I25110 Atherosclerotic heart disease of native coronary artery with unstable angina pectoris: Secondary | ICD-10-CM | POA: Diagnosis present

## 2017-12-15 DIAGNOSIS — I2510 Atherosclerotic heart disease of native coronary artery without angina pectoris: Secondary | ICD-10-CM

## 2017-12-15 DIAGNOSIS — F329 Major depressive disorder, single episode, unspecified: Secondary | ICD-10-CM | POA: Diagnosis present

## 2017-12-15 DIAGNOSIS — E042 Nontoxic multinodular goiter: Secondary | ICD-10-CM | POA: Diagnosis present

## 2017-12-15 DIAGNOSIS — Z87891 Personal history of nicotine dependence: Secondary | ICD-10-CM

## 2017-12-15 DIAGNOSIS — N179 Acute kidney failure, unspecified: Secondary | ICD-10-CM | POA: Diagnosis present

## 2017-12-15 DIAGNOSIS — Z79899 Other long term (current) drug therapy: Secondary | ICD-10-CM

## 2017-12-15 DIAGNOSIS — Z8249 Family history of ischemic heart disease and other diseases of the circulatory system: Secondary | ICD-10-CM

## 2017-12-15 DIAGNOSIS — Z955 Presence of coronary angioplasty implant and graft: Secondary | ICD-10-CM

## 2017-12-15 DIAGNOSIS — R079 Chest pain, unspecified: Secondary | ICD-10-CM | POA: Diagnosis present

## 2017-12-15 DIAGNOSIS — I10 Essential (primary) hypertension: Secondary | ICD-10-CM | POA: Diagnosis present

## 2017-12-15 DIAGNOSIS — I252 Old myocardial infarction: Secondary | ICD-10-CM | POA: Diagnosis present

## 2017-12-15 HISTORY — DX: Depression, unspecified: F32.A

## 2017-12-15 HISTORY — DX: Major depressive disorder, single episode, unspecified: F32.9

## 2017-12-15 HISTORY — DX: Personal history of urinary calculi: Z87.442

## 2017-12-15 HISTORY — DX: Non-ST elevation (NSTEMI) myocardial infarction: I21.4

## 2017-12-15 HISTORY — DX: Atherosclerotic heart disease of native coronary artery without angina pectoris: I25.10

## 2017-12-15 LAB — HEPATIC FUNCTION PANEL
ALT: 20 U/L (ref 0–44)
AST: 19 U/L (ref 15–41)
Albumin: 3.7 g/dL (ref 3.5–5.0)
Alkaline Phosphatase: 59 U/L (ref 38–126)
TOTAL PROTEIN: 6.2 g/dL — AB (ref 6.5–8.1)
Total Bilirubin: 0.5 mg/dL (ref 0.3–1.2)

## 2017-12-15 LAB — BASIC METABOLIC PANEL
ANION GAP: 13 (ref 5–15)
BUN: 51 mg/dL — ABNORMAL HIGH (ref 6–20)
CALCIUM: 10.8 mg/dL — AB (ref 8.9–10.3)
CO2: 24 mmol/L (ref 22–32)
Chloride: 101 mmol/L (ref 98–111)
Creatinine, Ser: 1.53 mg/dL — ABNORMAL HIGH (ref 0.44–1.00)
GFR calc non Af Amer: 37 mL/min — ABNORMAL LOW (ref >60)
GFR, EST AFRICAN AMERICAN: 43 mL/min — AB (ref >60)
Glucose, Bld: 89 mg/dL (ref 70–99)
Potassium: 3.4 mmol/L — ABNORMAL LOW (ref 3.5–5.1)
SODIUM: 138 mmol/L (ref 135–145)

## 2017-12-15 LAB — CBC
HCT: 40.8 % (ref 36.0–46.0)
Hemoglobin: 13.7 g/dL (ref 12.0–15.0)
MCH: 31.6 pg (ref 26.0–34.0)
MCHC: 33.6 g/dL (ref 30.0–36.0)
MCV: 94.2 fL (ref 78.0–100.0)
PLATELETS: 387 10*3/uL (ref 150–400)
RBC: 4.33 MIL/uL (ref 3.87–5.11)
RDW: 12.7 % (ref 11.5–15.5)
WBC: 6.9 10*3/uL (ref 4.0–10.5)

## 2017-12-15 LAB — URINALYSIS, ROUTINE W REFLEX MICROSCOPIC
Bilirubin Urine: NEGATIVE
Glucose, UA: 50 mg/dL — AB
Hgb urine dipstick: NEGATIVE
Ketones, ur: NEGATIVE mg/dL
Leukocytes, UA: NEGATIVE
Nitrite: NEGATIVE
Protein, ur: NEGATIVE mg/dL
Specific Gravity, Urine: 1.013 (ref 1.005–1.030)
pH: 5 (ref 5.0–8.0)

## 2017-12-15 LAB — TSH: TSH: 1.937 u[IU]/mL (ref 0.350–4.500)

## 2017-12-15 LAB — HEMOGLOBIN A1C
HEMOGLOBIN A1C: 6 % — AB (ref 4.8–5.6)
MEAN PLASMA GLUCOSE: 125.5 mg/dL

## 2017-12-15 LAB — I-STAT TROPONIN, ED
TROPONIN I, POC: 0.07 ng/mL (ref 0.00–0.08)
Troponin i, poc: 0.17 ng/mL (ref 0.00–0.08)

## 2017-12-15 LAB — D-DIMER, QUANTITATIVE (NOT AT ARMC): D-Dimer, Quant: <0.27 ug/mL-FEU (ref 0.00–0.50)

## 2017-12-15 LAB — TROPONIN I: Troponin I: 0.09 ng/mL (ref <0.03)

## 2017-12-15 MED ORDER — ATORVASTATIN CALCIUM 40 MG PO TABS: 40.0000 mg | ORAL_TABLET | Freq: Every day | ORAL | Status: DC

## 2017-12-15 MED ORDER — VENLAFAXINE HCL ER 37.5 MG PO CP24
37.5000 mg | ORAL_CAPSULE | Freq: Two times a day (BID) | ORAL | Status: DC
  Administered 2017-12-15 – 2017-12-20 (×10): 37.5 mg via ORAL
  Filled 2017-12-15 (×11): qty 1

## 2017-12-15 MED ORDER — SODIUM CHLORIDE 0.9 % IV BOLUS
1000.0000 mL | Freq: Once | INTRAVENOUS | Status: AC
  Administered 2017-12-15: 1000 mL via INTRAVENOUS

## 2017-12-15 MED ORDER — SODIUM CHLORIDE 0.9% FLUSH
3.0000 mL | Freq: Two times a day (BID) | INTRAVENOUS | Status: DC
  Administered 2017-12-15: 3 mL via INTRAVENOUS

## 2017-12-15 MED ORDER — HEPARIN BOLUS VIA INFUSION
4000.0000 [IU] | Freq: Once | INTRAVENOUS | Status: AC
  Administered 2017-12-15: 4000 [IU] via INTRAVENOUS
  Filled 2017-12-15: qty 4000

## 2017-12-15 MED ORDER — ACETAMINOPHEN 325 MG PO TABS
650.0000 mg | ORAL_TABLET | ORAL | Status: DC | PRN
  Administered 2017-12-17 – 2017-12-19 (×3): 650 mg via ORAL
  Filled 2017-12-15 (×4): qty 2

## 2017-12-15 MED ORDER — NITROGLYCERIN 0.4 MG SL SUBL: 0.4000 mg | SUBLINGUAL_TABLET | SUBLINGUAL | Status: DC | PRN

## 2017-12-15 MED ORDER — HEPARIN (PORCINE) IN NACL 100-0.45 UNIT/ML-% IJ SOLN
850.0000 [IU]/h | INTRAMUSCULAR | Status: DC
  Administered 2017-12-15: 950 [IU]/h via INTRAVENOUS
  Filled 2017-12-15: qty 250

## 2017-12-15 MED ORDER — SODIUM CHLORIDE 0.9 % IV SOLN: 250.0000 mL | INTRAVENOUS | Status: DC | PRN

## 2017-12-15 MED ORDER — POTASSIUM CHLORIDE CRYS ER 20 MEQ PO TBCR
40.0000 meq | EXTENDED_RELEASE_TABLET | Freq: Once | ORAL | Status: AC
  Administered 2017-12-15: 40 meq via ORAL
  Filled 2017-12-15: qty 2

## 2017-12-15 MED ORDER — ONDANSETRON HCL 4 MG/2ML IJ SOLN: 4.0000 mg | Freq: Four times a day (QID) | INTRAMUSCULAR | Status: DC | PRN

## 2017-12-15 MED ORDER — SODIUM CHLORIDE 0.9 % WEIGHT BASED INFUSION
1.0000 mL/kg/h | INTRAVENOUS | Status: DC
  Administered 2017-12-15: 1 mL/kg/h via INTRAVENOUS

## 2017-12-15 MED ORDER — ASPIRIN EC 81 MG PO TBEC
81.0000 mg | DELAYED_RELEASE_TABLET | Freq: Every day | ORAL | Status: DC
  Administered 2017-12-16 – 2017-12-19 (×4): 81 mg via ORAL
  Filled 2017-12-15 (×4): qty 1

## 2017-12-15 MED ORDER — AMLODIPINE BESYLATE 5 MG PO TABS
2.5000 mg | ORAL_TABLET | Freq: Every day | ORAL | Status: DC
  Administered 2017-12-16 – 2017-12-20 (×5): 2.5 mg via ORAL
  Filled 2017-12-15 (×5): qty 1

## 2017-12-15 MED ORDER — ASPIRIN EC 325 MG PO TBEC
325.0000 mg | DELAYED_RELEASE_TABLET | Freq: Once | ORAL | Status: AC
  Administered 2017-12-15: 325 mg via ORAL
  Filled 2017-12-15: qty 1

## 2017-12-15 MED ORDER — BISMUTH SUBSALICYLATE 262 MG PO CHEW
262.0000 mg | CHEWABLE_TABLET | Freq: Four times a day (QID) | ORAL | Status: DC | PRN
  Filled 2017-12-15: qty 1

## 2017-12-15 MED ORDER — SODIUM CHLORIDE 0.9% FLUSH: 3.0000 mL | INTRAVENOUS | Status: DC | PRN

## 2017-12-15 MED ORDER — FAMOTIDINE 20 MG PO TABS
20.0000 mg | ORAL_TABLET | Freq: Every day | ORAL | Status: DC
  Administered 2017-12-16 – 2017-12-20 (×5): 20 mg via ORAL
  Filled 2017-12-15 (×5): qty 1

## 2017-12-15 NOTE — ED Notes (Signed)
RN attempted IV x2 Charge will try

## 2017-12-15 NOTE — H&P (Addendum)
Cardiology Admission History and Physical:   Patient ID: Monique Hunt; MRN: [id number]; DOB: May 21, 1961   Admission date: 12/15/2017  Primary Care Provider: Camille Bal, PA-C Primary Cardiologist: New to Dr. Harrington Challenger  Chief Complaint: pain between shoulder pain   Patient Profile:   Monique Hunt is a 57 y.o. female with a history of hypertension and depression presented for evaluation of left leg pain by EMS.  Patient was seen in ER for symptoms concerning for angina 12/01/2017.  She had a substernal chest pressure radiating to her back associated with nausea, diaphoresis and shortness of breath.  Relieved with SL nitro x 2.  Left AMA.  History of Present Illness:   [name] no recurrent episode of chest pain since ER visit.  Patient noted palpitations and not feeling well today while at work.  Waxing and waning pain between her shoulder blade.  Exacerbated with minimal movement.  Associated with nausea and shortness of breath.  She also had diaphoresis however works in hot environment.  EMS was called due to episode.  No chest pain at this time.  Point-of-care troponin 0.07>>0.17.  Potassium 3.4.  Creatinine 1.53 (increased from last evaluation).  Negative d-dimer.  Currently pain-free.  Denies history of tobacco smoking or illicit drug use.  Social drinker.  Father had CABG at age 25.  Past Medical History:  Diagnosis Date  . Hypertension   . Kidney stone   . Migraine     Past Surgical History:  Procedure Laterality Date  . LITHOTRIPSY    . NO PAST SURGERIES       Medications Prior to Admission: Prior to Admission medications   Medication Sig Start Date End Date Taking? Authorizing Provider  amLODipine (NORVASC) 2.5 MG tablet Take 2.5 mg by mouth daily.   Yes [provider]  bismuth subsalicylate (PEPTO BISMOL) 262 MG chewable tablet Chew by mouth as needed for indigestion.   Yes [provider]  hydrochlorothiazide (HYDRODIURIL) 25 MG tablet  Take 25 mg by mouth daily.   Yes [provider]  lisinopril (PRINIVIL,ZESTRIL) 40 MG tablet Take 40 mg by mouth daily.   Yes [provider]  naproxen sodium (ALEVE) 220 MG tablet Take 220 mg by mouth as needed (pain).   Yes [provider]  ranitidine (ZANTAC) 150 MG tablet Take 150 mg by mouth daily.   Yes [provider]  venlafaxine XR (EFFEXOR-XR) 37.5 MG 24 hr capsule Take 37.5 mg by mouth 2 (two) times daily.   Yes [provider]  HYDROcodone-acetaminophen (NORCO/VICODIN) 5-325 MG per tablet Take 1 tablet by mouth every 4 (four) hours as needed for pain. Patient not taking: Reported on 12/15/2017 11/12/12   Charlann Lange, PA-C  ondansetron (ZOFRAN ODT) 4 MG disintegrating tablet Take 1 tablet (4 mg total) by mouth every 8 (eight) hours as needed for nausea or vomiting. Patient not taking: Reported on 12/15/2017 09/18/14   Ward, Delice Bison, DO     Allergies:   No Known Allergies  Social History:   Social History   Socioeconomic History  . Marital status: Married    Spouse name: Not on file  . Number of children: Not on file  . Years of education: Not on file  . Highest education level: Not on file  Occupational History  . Not on file  Social Needs  . Financial resource strain: Not on file  . Food insecurity:    Worry: Not on file    Inability: Not on file  .  Transportation needs:    Medical: Not on file    Non-medical: Not on file  Tobacco Use  . Smoking status: Former Research scientist (life sciences)  . Smokeless tobacco: Never Used  Substance and Sexual Activity  . Alcohol use: Yes    Comment: occassionally  . Drug use: No  . Sexual activity: Yes    Birth control/protection: Pill  Lifestyle  . Physical activity:    Days per week: Not on file    Minutes per session: Not on file  . Stress: Not on file  Relationships  . Social connections:    Talks on phone: Not on file    Gets together: Not on file    Attends religious service: Not on file     Active member of club or organization: Not on file    Attends meetings of clubs or organizations: Not on file    Relationship status: Not on file  . Intimate partner violence:    Fear of current or ex partner: Not on file    Emotionally abused: Not on file    Physically abused: Not on file    Forced sexual activity: Not on file  Other Topics Concern  . Not on file  Social History Narrative  . Not on file    Family History:   The patient's family history includes CAD in her father.    ROS:  Please see the history of present illness.  All other ROS reviewed and negative.     Physical Exam/Data:   Vitals:   12/15/17 1445 12/15/17 1500 12/15/17 1515 12/15/17 1530  BP: 115/68 124/73 125/80 120/78  Pulse: 69 68  68  Resp: 16 18 14 17   SpO2: 100% 99%  100%  Weight:      Height:       No intake or output data in the 24 hours ending 12/15/17 1740 Filed Weights   12/15/17 0933  Weight: 140 lb (63.5 kg)   Body mass index is 22.94 kg/m.  General:  Well nourished, well developed, in no acute distress HEENT: normal Lymph: no adenopathy Neck: no JVD Endocrine:  No thryomegaly Vascular: No carotid bruits; FA pulses 2+ bilaterally without bruits  Cardiac:  normal S1, S2; RRR; no murmur Lungs:  clear to auscultation bilaterally, no wheezing, rhonchi or rales  Abd: soft, nontender, no hepatomegaly  Ext: no  edema Musculoskeletal:  No deformities, BUE and BLE strength normal and equal Skin: warm and dry  Neuro:  CNs 2-12 intact, no focal abnormalities noted Psych:  Normal affect    EKG:  The ECG that was done today was personally reviewed and demonstrates sinus rhythm at rate of 80 bpm  Relevant CV Studies:   CT Angio Chest/Abd/Pel for Dissection W and/or Wo Contrast  12/01/17 IMPRESSION: 1. 9 mm right middle lobe nodule along the minor fissure with associated focal fissural thickening. Consider one of the following in 3 months for both low-risk and high-risk individuals:  (a) repeat chest CT, (b) follow-up PET-CT, or (c) tissue sampling. Given patient age, pulmonary consultation and short-term follow-up PET-CT suggested to ensure that this lesion is not hypermetabolic. This recommendation follows the consensus statement: Guidelines for Management of Incidental Pulmonary Nodules Detected on CT Images: From the Fleischner Society 2017; Radiology 2017; 284:228-243. 2. No thoraco abdominal aortic aneurysm. No dissection of the thoracoabdominal aorta. No clinically significant stenosis of major arterial anatomy in the chest, abdomen, or pelvis. 3. 2.5 cm left thyroid nodule. Thyroid ultrasound recommended to further characterize. 4. Bilateral  nonobstructing renal stones. 5. Colonic diverticulosis without diverticulitis.   Laboratory Data:  Chemistry Recent Labs  Lab 12/15/17 0934  NA 138  K 3.4*  CL 101  CO2 24  GLUCOSE 89  BUN 51*  CREATININE 1.53*  CALCIUM 10.8*  GFRNONAA 37*  GFRAA 43*  ANIONGAP 13    No results for input(s): PROT, ALBUMIN, AST, ALT, ALKPHOS, BILITOT in the last 168 hours. Hematology Recent Labs  Lab 12/15/17 0934  WBC 6.9  RBC 4.33  HGB 13.7  HCT 40.8  MCV 94.2  MCH 31.6  MCHC 33.6  RDW 12.7  PLT 387   Cardiac EnzymesNo results for input(s): TROPONINI in the last 168 hours.  Recent Labs  Lab 12/15/17 1000 12/15/17 1519  TROPIPOC 0.07 0.17*    BNPNo results for input(s): BNP, PROBNP in the last 168 hours.  DDimer  Recent Labs  Lab 12/15/17 1300  DDIMER <0.27    Radiology/Studies:  Dg Chest 2 View  Result Date: 12/15/2017 CLINICAL DATA:  Chest pain and dizziness. EXAM: CHEST - 2 VIEW COMPARISON:  12/01/2017 FINDINGS: The heart size is normal. There is no pleural effusion or edema identified. Atelectasis is identified within both lung bases. Right lung nodule measuring approximately 8 mm is again noted. A scoliosis deformity is identified with associated multi level degenerative disc disease. IMPRESSION:  1. Bibasilar atelectasis. 2. 8 mm right lung nodule. See CT report from 12/01/2017 for management recommendations. 3. Scoliosis and degenerative disc disease. Electronically Signed   By: Kerby Moors M.D.   On: 12/15/2017 10:26    Assessment and Plan:   1. NSTEMI - POC troponin 0.07>>0.17.  Symptoms concerning for angina. Cardiac risk factors includes HTN and family hx of CAD. Admit and cycle troponin. Start heparin. LHC tomorrow. The patient understands that risks include but are not limited to stroke (1 in 1000), death (1 in 3), kidney failure [usually temporary] (1 in 500), bleeding (1 in 200), allergic reaction [possibly serious] (1 in 200), and agrees to proceed. Start ASA and statin. Check lipid panel, A1c and TSH.   2. HTN - BP stable. Hold HTCZ and lisinopril given AKI. Continue amlodipine. Add BB pending cath.   3. AKI -hydrate overnight.   4. Pulmonary nodule on recent CT - Follow up with PCP as outpatient   5. Thyroid nodules - Follow up with PCP. Check TSH  Severity of Illness: The appropriate patient status for this patient is INPATIENT. Inpatient status is judged to be reasonable and necessary in order to provide the required intensity of service to ensure the patient's safety. The patient's presenting symptoms, physical exam findings, and initial radiographic and laboratory data in the context of their chronic comorbidities is felt to place them at high risk for further clinical deterioration. Furthermore, it is not anticipated that the patient will be medically stable for discharge from the hospital within 2 midnights of admission. The following factors support the patient status of inpatient.   " The patient's presenting symptoms include - pain between shoulder blade " The worrisome physical exam findings include None " The initial radiographic and laboratory data are worrisome because of Elevated troponin " The chronic co-morbidities include HTN   * I certify that  at the point of admission it is my clinical judgment that the patient will require inpatient hospital care spanning beyond 2 midnights from the point of admission due to high intensity of service, high risk for further deterioration and high frequency of surveillance required.*  For questions or updates, please contact [place] Please consult [email or web address] for contact info under Cardiology/STEMI.    Jarrett Soho, PA  12/15/2017 5:40 PM   Pt seen and examined   I agree with findings as noted above by B Bhagat Pt is a 57 yo with history of HTN   Recently seen in ED for CP radiating to Back   CT neg for dissection   Left AMA Returns today wit hrecurrent spell Pain in back    Labs signif for tropoinin 0.17  ON exam:    Lungs CTA   Cardiac RRR   No S3   Abd is benign   Ext are without edema  EKG without acute changes  Will admit  Discussed findings with pt   Would recomm L heart cath to defne anatomy  Risks/benefits described   Pt understands and agrees to proceed. Plan for tomorrow.  Dorris Carnes

## 2017-12-15 NOTE — ED Triage Notes (Signed)
PT reports on arrival office she felt her heart beating  was fast . Pt checked the HR at work and rated was 110. Pt then felt SHOB,Nausea and Upper back pain . Pt called 911 to [place] while talking.

## 2017-12-15 NOTE — ED Provider Notes (Signed)
[facility] EMERGENCY DEPARTMENT Provider Note   CSN: [id number] Arrival date & time: 12/15/17  0630     History   Chief Complaint Chief Complaint  Patient presents with  . Shortness of Breath    HPI Monique Hunt is a 57 y.o. female with history of hypertension, kidney stones, migraine who presents with shortness of breath and a resolved episode of severe upper back pain.  Patient reports she had associated nausea and diaphoresis.  She could not describe the pain, but 1 of the worst pains in her life that lasted a few minutes this morning.  She reports her heart rate went up to 110.  She denies any pain at this time.  She denies any abdominal pain, nausea, vomiting, urinary symptoms.  She was recently advised to be admitted to the hospital for chest pain rule out on 12/01/2017, but she left [employer].  She has no personal history of cardiac problems, but does have family history.  She denies any recent long trips, surgeries, known cancer, new leg pain or swelling, history of blood clots.  HPI  Past Medical History:  Diagnosis Date  . Hypertension   . Kidney stone   . Migraine     Patient Active Problem List   Diagnosis Date Noted  . Hypertension 12/01/2017  . Chest pain syndrome 12/01/2017  . Former tobacco use 12/01/2017  . Chest pain 12/01/2017  . Depression 06/04/2012  . Pyelonephritis, acute 09/27/2011  . Nephrolithiasis 09/27/2011  . Hypokalemia 09/27/2011  . Hyponatremia 09/27/2011    Past Surgical History:  Procedure Laterality Date  . LITHOTRIPSY    . NO PAST SURGERIES       OB History   None      Home Medications    Prior to Admission medications   Medication Sig Start Date End Date Taking? Authorizing Provider  amLODipine (NORVASC) 2.5 MG tablet Take 2.5 mg by mouth daily.   Yes [provider]  bismuth subsalicylate (PEPTO BISMOL) 262 MG chewable tablet Chew by mouth as needed for indigestion.   Yes  [provider]  hydrochlorothiazide (HYDRODIURIL) 25 MG tablet Take 25 mg by mouth daily.   Yes [provider]  lisinopril (PRINIVIL,ZESTRIL) 40 MG tablet Take 40 mg by mouth daily.   Yes [provider]  naproxen sodium (ALEVE) 220 MG tablet Take 220 mg by mouth as needed (pain).   Yes [provider]  ranitidine (ZANTAC) 150 MG tablet Take 150 mg by mouth daily.   Yes [provider]  venlafaxine XR (EFFEXOR-XR) 37.5 MG 24 hr capsule Take 37.5 mg by mouth 2 (two) times daily.   Yes [provider]    Family History Family History  Problem Relation Age of Onset  . CAD Father     Social History Social History   Tobacco Use  . Smoking status: Former Research scientist (life sciences)  . Smokeless tobacco: Never Used  Substance Use Topics  . Alcohol use: Yes    Comment: occassionally  . Drug use: No     Allergies   Patient has no known allergies.   Review of Systems Review of Systems  Constitutional: Positive for diaphoresis. Negative for chills and fever.  HENT: Negative for facial swelling and sore throat.   Respiratory: Positive for shortness of breath.   Cardiovascular: Negative for chest pain.  Gastrointestinal: Negative for abdominal pain, nausea and vomiting.  Genitourinary: Negative for dysuria.  Musculoskeletal: Positive for back pain.  Skin: Negative for  rash and wound.  Neurological: Negative for headaches.  Psychiatric/Behavioral: The patient is not nervous/anxious.      Physical Exam Updated Vital Signs BP (!) 147/84   Pulse 69   Resp 19   Ht 5' 5.5" (1.664 m)   Wt 63.5 kg (140 lb)   LMP 02/28/2011   SpO2 100%   BMI 22.94 kg/m   Physical Exam  Constitutional: She appears well-developed and well-nourished. No distress.  HENT:  Head: Normocephalic and atraumatic.  Mouth/Throat: Oropharynx is clear and moist. No oropharyngeal exudate.  Eyes: Pupils are equal, round, and reactive to light. Conjunctivae are normal. Right  eye exhibits no discharge. Left eye exhibits no discharge. No scleral icterus.  Neck: Normal range of motion. Neck supple. No thyromegaly present.  Cardiovascular: Normal rate, regular rhythm, normal heart sounds and intact distal pulses. Exam reveals no gallop and no friction rub.  No murmur heard. Pulmonary/Chest: Effort normal and breath sounds normal. No stridor. No respiratory distress. She has no wheezes. She has no rales.  Abdominal: Soft. Bowel sounds are normal. She exhibits no distension. There is no tenderness. There is no rebound, no guarding and no CVA tenderness.  Musculoskeletal: She exhibits no edema.       Right lower leg: Normal.       Left lower leg: Normal.  No tenderness on the back  Lymphadenopathy:    She has no cervical adenopathy.  Neurological: She is alert. Coordination normal.  Skin: Skin is warm and dry. No rash noted. She is not diaphoretic. No pallor.  Psychiatric: She has a normal mood and affect.  Nursing note and vitals reviewed.    ED Treatments / Results  Labs (all labs ordered are listed, but only abnormal results are displayed) Labs Reviewed  BASIC METABOLIC PANEL - Abnormal; Notable for the following components:      Result Value   Potassium 3.4 (*)    BUN 51 (*)    Creatinine, Ser 1.53 (*)    Calcium 10.8 (*)    GFR calc non Af Amer 37 (*)    GFR calc Af Amer 43 (*)    All other components within normal limits  URINALYSIS, ROUTINE W REFLEX MICROSCOPIC - Abnormal; Notable for the following components:   Color, Urine STRAW (*)    Glucose, UA 50 (*)    All other components within normal limits  I-STAT TROPONIN, ED - Abnormal; Notable for the following components:   Troponin i, poc 0.17 (*)    All other components within normal limits  CBC  D-DIMER, QUANTITATIVE (NOT AT [facility])  HEPATIC FUNCTION PANEL  I-STAT TROPONIN, ED    EKG EKG Interpretation  Date/Time:  Thursday December 15 2017 09:31:46 EDT Ventricular Rate:  79 PR  Interval:  146 QRS Duration: 82 QT Interval:  378 QTC Calculation: 433 R Axis:   -9 Text Interpretation:  Normal sinus rhythm Minimal voltage criteria for LVH, may be normal variant Anterior infarct , age undetermined Abnormal ECG Confirmed by Julianne Rice [phone]) on 12/15/2017 2:32:12 PM   Radiology Dg Chest 2 View  Result Date: 12/15/2017 CLINICAL DATA:  Chest pain and dizziness. EXAM: CHEST - 2 VIEW COMPARISON:  12/01/2017 FINDINGS: The heart size is normal. There is no pleural effusion or edema identified. Atelectasis is identified within both lung bases. Right lung nodule measuring approximately 8 mm is again noted. A scoliosis deformity is identified with associated multi level degenerative disc disease. IMPRESSION: 1. Bibasilar atelectasis. 2. 8 mm right lung nodule.  See CT report from 12/01/2017 for management recommendations. 3. Scoliosis and degenerative disc disease. Electronically Signed   By: Kerby Moors M.D.   On: 12/15/2017 10:26    Procedures Procedures (including critical care time)  CRITICAL CARE Performed by: Frederica Kuster   Total critical care time: 35 minutes  Critical care time was exclusive of separately billable procedures and treating other patients.  Critical care was necessary to treat or prevent imminent or life-threatening deterioration.  Critical care was time spent personally by me on the following activities: development of treatment plan with patient and/or surrogate as well as nursing, discussions with consultants, evaluation of patient's response to treatment, examination of patient, obtaining history from patient or surrogate, ordering and performing treatments and interventions, ordering and review of laboratory studies, ordering and review of radiographic studies, pulse oximetry and re-evaluation of patient's condition.   Medications Ordered in ED Medications  potassium chloride SA (K-DUR,KLOR-CON) CR tablet 40 mEq (has no administration  in time range)  sodium chloride 0.9 % bolus 1,000 mL (0 mLs Intravenous Stopped 12/15/17 1515)  sodium chloride 0.9 % bolus 1,000 mL (0 mLs Intravenous Stopped 12/15/17 1650)  aspirin EC tablet 325 mg (325 mg Oral Given 12/15/17 1650)     Initial Impression / Assessment and Plan / ED Course  I have reviewed the triage vital signs and the nursing notes.  Pertinent labs & imaging results that were available during my care of the patient were reviewed by me and considered in my medical decision making (see chart for details).     Patient presenting with NSTEMI.  Patient had elevation of troponin from 0.07-0.17 in the setting of intermittent symptoms concerning for angina in the past couple weeks.  D-dimer is negative.  Patient also with AKI with BUN 51, creatinine 1.53, elevated from her visit on 12/01/2017.  CBC unremarkable.  Chest x-ray shows bibasilar atelectasis, 8 mm right lung nodule, which patient is aware of from CT conducted on 12/01/2017.  Patient given aspirin in the ED.  She has been asymptomatic in ED.  Patient evaluated by cardiology who would like to admit for heart catheterization tomorrow.  Patient understands and agrees with plan. I discussed patient case with Dr. Lita Mains who guided the patient's management and agrees with plan.   Final Clinical Impressions(s) / ED Diagnoses   Final diagnoses:  NSTEMI (non-ST elevated myocardial infarction) [facility])    ED Discharge Orders    None       Frederica Kuster, PA-C 12/15/17 1756    Julianne Rice, MD 12/15/17 2135

## 2017-12-15 NOTE — Progress Notes (Signed)
ANTICOAGULATION CONSULT NOTE - Initial Consult  Pharmacy Consult for IV heparin  Indication: chest pain/ACS  No Known Allergies  Patient Measurements: Height: 5\' 5"  (165.1 cm) Weight: 151 lb 11.2 oz (68.8 kg) IBW/kg (Calculated) : 57 Heparin Dosing Weight: 68.8  Vital Signs: Temp: 97.7 F (36.5 C) (07/18 2131) Temp Source: Axillary (07/18 2131) BP: 140/79 (07/18 2131) Pulse Rate: 75 (07/18 2131)  Labs: Recent Labs    12/15/17 0934  HGB 13.7  HCT 40.8  PLT 387  CREATININE 1.53*    Estimated Creatinine Clearance: 39.5 mL/min (A) (by C-G formula based on SCr of 1.53 mg/dL (H)).   Medical History: Past Medical History:  Diagnosis Date  . Hypertension   . Kidney stone   . Migraine     Medications:  Scheduled:  . [START ON 12/16/2017] amLODipine  2.5 mg Oral Daily  . [START ON 12/16/2017] aspirin EC  81 mg Oral Daily  . [START ON 12/16/2017] atorvastatin  40 mg Oral q1800  . [START ON 12/16/2017] famotidine  20 mg Oral Daily  . sodium chloride flush  3 mL Intravenous Q12H  . venlafaxine XR  37.5 mg Oral BID   Infusions:  . sodium chloride    . sodium chloride      Assessment: 57 yo female admitted with chest pain, pharmacy asked to start IV heparin.  No known anticoagulants PTA. Planning LHC in the AM.  Goal of Therapy:  Heparin level 0.3-0.7 units/ml Monitor platelets by anticoagulation protocol: Yes   Plan:  1. Start IV heparin with bolus of 4000 units x 1. 2. Then start heparin gtt at 950 units/hr. 3. Check heparin level 8 hrs after gtt starts. 4. Daily heparin level and CBC.  Marguerite Olea, [facility] Clinical Pharmacist Phone [phone]  12/15/2017 9:53 PM

## 2017-12-16 ENCOUNTER — Encounter (HOSPITAL_COMMUNITY)
Admission: EM | Disposition: A | Discharge status: Home / Self Care | Payer: Self-pay | Source: Home / Self Care | Attending: Internal Medicine

## 2017-12-16 ENCOUNTER — Encounter: Payer: Self-pay | Admitting: Internal Medicine

## 2017-12-16 DIAGNOSIS — I25110 Atherosclerotic heart disease of native coronary artery with unstable angina pectoris: Secondary | ICD-10-CM

## 2017-12-16 HISTORY — PX: LEFT HEART CATH AND CORONARY ANGIOGRAPHY: CATH118249

## 2017-12-16 LAB — CBC
HEMATOCRIT: 36.6 % (ref 36.0–46.0)
Hemoglobin: 11.9 g/dL — ABNORMAL LOW (ref 12.0–15.0)
MCH: 31.4 pg (ref 26.0–34.0)
MCHC: 32.5 g/dL (ref 30.0–36.0)
MCV: 96.6 fL (ref 78.0–100.0)
Platelets: 350 10*3/uL (ref 150–400)
RBC: 3.79 MIL/uL — ABNORMAL LOW (ref 3.87–5.11)
RDW: 13 % (ref 11.5–15.5)
WBC: 5.1 10*3/uL (ref 4.0–10.5)

## 2017-12-16 LAB — LIPID PANEL
CHOL/HDL RATIO: 7.2 ratio
Cholesterol: 323 mg/dL — ABNORMAL HIGH (ref 0–200)
HDL: 45 mg/dL (ref >40)
LDL Cholesterol: 199 mg/dL — ABNORMAL HIGH (ref 0–99)
Triglycerides: 394 mg/dL — ABNORMAL HIGH (ref <150)
VLDL: 79 mg/dL — AB (ref 0–40)

## 2017-12-16 LAB — BASIC METABOLIC PANEL
ANION GAP: 11 (ref 5–15)
BUN: 38 mg/dL — AB (ref 6–20)
CHLORIDE: 109 mmol/L (ref 98–111)
CO2: 22 mmol/L (ref 22–32)
Calcium: 9 mg/dL (ref 8.9–10.3)
Creatinine, Ser: 1.18 mg/dL — ABNORMAL HIGH (ref 0.44–1.00)
GFR calc Af Amer: 58 mL/min — ABNORMAL LOW (ref >60)
GFR, EST NON AFRICAN AMERICAN: 50 mL/min — AB (ref >60)
GLUCOSE: 103 mg/dL — AB (ref 70–99)
POTASSIUM: 3.9 mmol/L (ref 3.5–5.1)
Sodium: 142 mmol/L (ref 135–145)

## 2017-12-16 LAB — TROPONIN I
TROPONIN I: 0.03 ng/mL — AB (ref <0.03)
Troponin I: 0.04 ng/mL (ref <0.03)

## 2017-12-16 LAB — HEPARIN LEVEL (UNFRACTIONATED): HEPARIN UNFRACTIONATED: 0.71 [IU]/mL — AB (ref 0.30–0.70)

## 2017-12-16 LAB — HIV ANTIBODY (ROUTINE TESTING W REFLEX): HIV SCREEN 4TH GENERATION: NONREACTIVE

## 2017-12-16 SURGERY — LEFT HEART CATH AND CORONARY ANGIOGRAPHY
Anesthesia: LOCAL

## 2017-12-16 MED ORDER — LIDOCAINE HCL (PF) 1 % IJ SOLN
INTRAMUSCULAR | Status: AC
  Filled 2017-12-16: qty 30

## 2017-12-16 MED ORDER — MIDAZOLAM HCL 2 MG/2ML IJ SOLN
INTRAMUSCULAR | Status: AC
  Filled 2017-12-16: qty 2

## 2017-12-16 MED ORDER — HEPARIN SODIUM (PORCINE) 1000 UNIT/ML IJ SOLN
INTRAMUSCULAR | Status: DC | PRN
  Administered 2017-12-16: 3500 [IU] via INTRAVENOUS

## 2017-12-16 MED ORDER — HEPARIN (PORCINE) IN NACL 1000-0.9 UT/500ML-% IV SOLN
INTRAVENOUS | Status: AC
  Filled 2017-12-16: qty 500

## 2017-12-16 MED ORDER — ACETAMINOPHEN 325 MG PO TABS: 650.0000 mg | ORAL_TABLET | ORAL | Status: DC | PRN

## 2017-12-16 MED ORDER — ASPIRIN 81 MG PO CHEW
CHEWABLE_TABLET | ORAL | Status: AC
  Administered 2017-12-16: 81 mg
  Filled 2017-12-16: qty 1

## 2017-12-16 MED ORDER — HEPARIN (PORCINE) IN NACL 100-0.45 UNIT/ML-% IJ SOLN
950.0000 [IU]/h | INTRAMUSCULAR | Status: DC
  Administered 2017-12-17: 850 [IU]/h via INTRAVENOUS
  Administered 2017-12-18: 950 [IU]/h via INTRAVENOUS
  Filled 2017-12-16 (×2): qty 250

## 2017-12-16 MED ORDER — SODIUM CHLORIDE 0.9% FLUSH: 3.0000 mL | Freq: Two times a day (BID) | INTRAVENOUS | Status: DC

## 2017-12-16 MED ORDER — SODIUM CHLORIDE 0.9 % IV SOLN: INTRAVENOUS | Status: AC

## 2017-12-16 MED ORDER — LIDOCAINE HCL (PF) 1 % IJ SOLN
INTRAMUSCULAR | Status: DC | PRN
  Administered 2017-12-16: 5 mL via INTRADERMAL

## 2017-12-16 MED ORDER — CLOPIDOGREL BISULFATE 75 MG PO TABS
75.0000 mg | ORAL_TABLET | Freq: Every day | ORAL | Status: DC
  Administered 2017-12-17 – 2017-12-18 (×2): 75 mg via ORAL
  Filled 2017-12-16 (×2): qty 1

## 2017-12-16 MED ORDER — HEPARIN (PORCINE) IN NACL 1000-0.9 UT/500ML-% IV SOLN
INTRAVENOUS | Status: DC | PRN
  Administered 2017-12-16 (×2): 500 mL

## 2017-12-16 MED ORDER — CLOPIDOGREL BISULFATE 75 MG PO TABS
300.0000 mg | ORAL_TABLET | Freq: Once | ORAL | Status: AC
  Administered 2017-12-16: 300 mg via ORAL
  Filled 2017-12-16: qty 4

## 2017-12-16 MED ORDER — FENTANYL CITRATE (PF) 100 MCG/2ML IJ SOLN
INTRAMUSCULAR | Status: DC | PRN
  Administered 2017-12-16: 25 ug via INTRAVENOUS
  Administered 2017-12-16: 50 ug via INTRAVENOUS

## 2017-12-16 MED ORDER — HEPARIN (PORCINE) IN NACL 2-0.9 UNITS/ML
INTRAMUSCULAR | Status: DC | PRN
  Administered 2017-12-16: 10 mL via INTRA_ARTERIAL

## 2017-12-16 MED ORDER — SODIUM CHLORIDE 0.9% FLUSH: 3.0000 mL | INTRAVENOUS | Status: DC | PRN

## 2017-12-16 MED ORDER — OXYCODONE HCL 5 MG PO TABS: 5.0000 mg | ORAL_TABLET | ORAL | Status: DC | PRN

## 2017-12-16 MED ORDER — HEPARIN SODIUM (PORCINE) 1000 UNIT/ML IJ SOLN
INTRAMUSCULAR | Status: AC
  Filled 2017-12-16: qty 1

## 2017-12-16 MED ORDER — IOHEXOL 350 MG/ML SOLN
INTRAVENOUS | Status: DC | PRN
  Administered 2017-12-16: 100 mL

## 2017-12-16 MED ORDER — FENTANYL CITRATE (PF) 100 MCG/2ML IJ SOLN
INTRAMUSCULAR | Status: AC
  Filled 2017-12-16: qty 2

## 2017-12-16 MED ORDER — MIDAZOLAM HCL 2 MG/2ML IJ SOLN
INTRAMUSCULAR | Status: DC | PRN
  Administered 2017-12-16 (×2): 1 mg via INTRAVENOUS

## 2017-12-16 MED ORDER — ONDANSETRON HCL 4 MG/2ML IJ SOLN
4.0000 mg | Freq: Four times a day (QID) | INTRAMUSCULAR | Status: DC | PRN
Start: 2017-12-16 — End: 2017-12-16

## 2017-12-16 MED ORDER — VERAPAMIL HCL 2.5 MG/ML IV SOLN
INTRAVENOUS | Status: AC
  Filled 2017-12-16: qty 2

## 2017-12-16 MED ORDER — ASPIRIN 81 MG PO CHEW: 81.0000 mg | CHEWABLE_TABLET | Freq: Every day | ORAL | Status: DC

## 2017-12-16 MED ORDER — SODIUM CHLORIDE 0.9 % IV SOLN: 250.0000 mL | INTRAVENOUS | Status: DC | PRN

## 2017-12-16 MED ORDER — ATORVASTATIN CALCIUM 80 MG PO TABS
80.0000 mg | ORAL_TABLET | Freq: Every day | ORAL | Status: DC
  Administered 2017-12-16 – 2017-12-18 (×3): 80 mg via ORAL
  Filled 2017-12-16 (×3): qty 1

## 2017-12-16 SURGICAL SUPPLY — 11 items
CATH 5FR JL3.5 JR4 ANG PIG MP (CATHETERS) ×2 IMPLANT
CATH OPTITORQUE TIG 4.0 5F (CATHETERS) ×2 IMPLANT
DEVICE RAD COMP TR BAND LRG (VASCULAR PRODUCTS) ×2 IMPLANT
GLIDESHEATH SLEND A-KIT 6F 22G (SHEATH) ×2 IMPLANT
GUIDEWIRE INQWIRE 1.5J.035X260 (WIRE) ×1 IMPLANT
INQWIRE 1.5J .035X260CM (WIRE) ×2
KIT HEART LEFT (KITS) ×2 IMPLANT
PACK CARDIAC CATHETERIZATION (CUSTOM PROCEDURE TRAY) ×2 IMPLANT
SHEATH PROBE COVER 6X72 (BAG) ×2 IMPLANT
TRANSDUCER W/STOPCOCK (MISCELLANEOUS) ×2 IMPLANT
TUBING CIL FLEX 10 FLL-RA (TUBING) ×2 IMPLANT

## 2017-12-16 NOTE — Progress Notes (Signed)
ANTICOAGULATION CONSULT NOTE - Follow Up Consult  Pharmacy Consult for Heparin Indication: chest pain/ACS  No Known Allergies  Patient Measurements: Height: 5\' 5"  (165.1 cm) Weight: 151 lb 11.2 oz (68.8 kg) IBW/kg (Calculated) : 57 Heparin Dosing Weight: 69 kg  Vital Signs: Temp: 98.2 F (36.8 C) (07/19 1432) Temp Source: Oral (07/19 1432) BP: 157/85 (07/19 1640) Pulse Rate: 0 (07/19 1640)  Labs: Recent Labs    12/15/17 0934 12/15/17 2206 12/16/17 0714 12/16/17 1236  HGB 13.7  --  11.9*  --   HCT 40.8  --  36.6  --   PLT 387  --  350  --   HEPARINUNFRC  --   --  0.71*  --   CREATININE 1.53*  --  1.18*  --   TROPONINI  --  0.09* 0.04* 0.03*    Estimated Creatinine Clearance: 51.2 mL/min (A) (by C-G formula based on SCr of 1.18 mg/dL (H)).  Assessment:  57 yr old female admitted 12/15/17 with chest pain.  Pharmacy assisting with IV heparin dosing.      S/p cardiac cath. Heparin to resume 8 hrs after sheath out. Radial sheath out ~4:45pm. No bleeding or hematoma noted.  PCI planned for 7/22 with femoral approach.    Last heparin level was just above therapeutic range (0.71) on 950 units/hr and rate decreased to 850 units/hr this am pre-cath.  Goal of Therapy:  Heparin level 0.3-0.7 units/ml Monitor platelets by anticoagulation protocol: Yes   Plan:   Resume heparin drip ~1am on 7/20 at 850 units/hr  Heparin level and CBC ~6 hrs after drip resumes.  Daily heparin level and CBC while on heparin.  Arty Baumgartner, [place] Pager: [phone] 12/16/2017,6:12 PM

## 2017-12-16 NOTE — Progress Notes (Signed)
Progress Note  Patient Name: Monique Hunt Date of Encounter: 12/16/2017  Primary Cardiologist:New    Subjective   No CP or back pain  Breathing is OK    Inpatient Medications    Scheduled Meds: . amLODipine  2.5 mg Oral Daily  . aspirin EC  81 mg Oral Daily  . atorvastatin  40 mg Oral q1800  . famotidine  20 mg Oral Daily  . sodium chloride flush  3 mL Intravenous Q12H  . venlafaxine XR  37.5 mg Oral BID   Continuous Infusions: . sodium chloride    . sodium chloride 1 mL/kg/hr (12/15/17 2331)  . heparin 850 Units/hr (12/16/17 1014)   PRN Meds: sodium chloride, acetaminophen, bismuth subsalicylate, nitroGLYCERIN, ondansetron (ZOFRAN) IV, sodium chloride flush   Vital Signs    Vitals:   12/15/17 2000 12/15/17 2131 12/16/17 0604 12/16/17 1014  BP: 127/73 140/79 140/87 (!) 140/96  Pulse: 68 75 69   Resp: 16 16 16    Temp:  97.7 F (36.5 C) 97.8 F (36.6 C)   TempSrc:  Axillary Oral   SpO2: 98% 100% 98%   Weight:  151 lb 11.2 oz (68.8 kg)    Height:  5\' 5"  (1.651 m)      Intake/Output Summary (Last 24 hours) at 12/16/2017 1403 Last data filed at 12/16/2017 0538 Gross per 24 hour  Intake 589.5 ml  Output -  Net 589.5 ml   Filed Weights   12/15/17 0933 12/15/17 2131  Weight: 140 lb (63.5 kg) 151 lb 11.2 oz (68.8 kg)    Telemetry    SR - Personally Reviewed  ECG      Physical Exam   GEN: No acute distress.   Neck: No JVD Cardiac: RRR, no murmurs, rubs, or gallops.  Respiratory: Clear to auscultation bilaterally. GI: Soft, nontender, non-distended  MS: No edema; No deformity. Neuro:  Nonfocal  Psych: Normal affect   Labs    Chemistry Recent Labs  Lab 12/15/17 0934 12/15/17 2206 12/16/17 0714  NA 138  --  142  K 3.4*  --  3.9  CL 101  --  109  CO2 24  --  22  GLUCOSE 89  --  103*  BUN 51*  --  38*  CREATININE 1.53*  --  1.18*  CALCIUM 10.8*  --  9.0  PROT  --  6.2*  --   ALBUMIN  --  3.7  --   AST  --  19  --   ALT  --  20  --    ALKPHOS  --  59  --   BILITOT  --  0.5  --   GFRNONAA 37*  --  50*  GFRAA 43*  --  58*  ANIONGAP 13  --  11     Hematology Recent Labs  Lab 12/15/17 0934 12/16/17 0714  WBC 6.9 5.1  RBC 4.33 3.79*  HGB 13.7 11.9*  HCT 40.8 36.6  MCV 94.2 96.6  MCH 31.6 31.4  MCHC 33.6 32.5  RDW 12.7 13.0  PLT 387 350    Cardiac Enzymes Recent Labs  Lab 12/15/17 2206 12/16/17 0714 12/16/17 1236  TROPONINI 0.09* 0.04* 0.03*    Recent Labs  Lab 12/15/17 1000 12/15/17 1519  TROPIPOC 0.07 0.17*     BNPNo results for input(s): BNP, PROBNP in the last 168 hours.   DDimer  Recent Labs  Lab 12/15/17 1300  DDIMER <0.27     Radiology    Dg Chest 2  View  Result Date: 12/15/2017 CLINICAL DATA:  Chest pain and dizziness. EXAM: CHEST - 2 VIEW COMPARISON:  12/01/2017 FINDINGS: The heart size is normal. There is no pleural effusion or edema identified. Atelectasis is identified within both lung bases. Right lung nodule measuring approximately 8 mm is again noted. A scoliosis deformity is identified with associated multi level degenerative disc disease. IMPRESSION: 1. Bibasilar atelectasis. 2. 8 mm right lung nodule. See CT report from 12/01/2017 for management recommendations. 3. Scoliosis and degenerative disc disease. Electronically Signed   By: Kerby Moors M.D.   On: 12/15/2017 10:26    Cardiac Studies   Cath pending   Patient Profile     57 y.o. female no history of CAD   Presents with CP   Trop 0.17  Assessment & Plan    1  Chest pain/back pain   Plan for L heart cath today   Risk/benefits described   Pt agrees to proceed  2  HL   LDL 199  HDL 45  Trig 323  (A1C 6)   Pt admits to eating fatty foods and carbs  Will await cath results but should be on statin   Discussed diet as well    For questions or updates, please contact [facility] Please consult [email or web address] for contact info under Cardiology/STEMI.      Signed, Dorris Carnes, MD  12/16/2017, 2:03 PM

## 2017-12-16 NOTE — Progress Notes (Signed)
ANTICOAGULATION CONSULT NOTE - Initial Consult  Pharmacy Consult for IV heparin  Indication: chest pain/ACS  No Known Allergies  Patient Measurements: Height: 5\' 5"  (165.1 cm) Weight: 151 lb 11.2 oz (68.8 kg) IBW/kg (Calculated) : 57 Heparin Dosing Weight: 68.8  Vital Signs: Temp: 97.8 F (36.6 C) (07/19 0604) Temp Source: Oral (07/19 0604) BP: 140/87 (07/19 0604) Pulse Rate: 69 (07/19 0604)  Labs: Recent Labs    12/15/17 0934 12/15/17 2206 12/16/17 0714  HGB 13.7  --  11.9*  HCT 40.8  --  36.6  PLT 387  --  350  HEPARINUNFRC  --   --  0.71*  CREATININE 1.53*  --  1.18*  TROPONINI  --  0.09* 0.04*    Estimated Creatinine Clearance: 51.2 mL/min (A) (by C-G formula based on SCr of 1.18 mg/dL (H)).   Medical History: Past Medical History:  Diagnosis Date  . Depression   . History of kidney stones   . Hypertension   . Migraine    "stopped ~ 2010" (12/15/2017)  . NSTEMI (non-ST elevated myocardial infarction) ([place]) 12/15/2017    Medications:  Scheduled:  . amLODipine  2.5 mg Oral Daily  . aspirin EC  81 mg Oral Daily  . atorvastatin  40 mg Oral q1800  . famotidine  20 mg Oral Daily  . sodium chloride flush  3 mL Intravenous Q12H  . venlafaxine XR  37.5 mg Oral BID   Infusions:  . sodium chloride    . sodium chloride 1 mL/kg/hr (12/15/17 2331)  . heparin 950 Units/hr (12/15/17 2341)    Assessment: 57 yo female admitted with chest pain, pharmacy asked to start IV heparin on 7/18.  Pt scheduled for LHC today at 1500. No known anticoagulants PTA.   Heparin level slightly supratherapeutic today at 0.71. CBC stable. No s/sx of bleeding.  Goal of Therapy:  Heparin level 0.3-0.7 units/ml Monitor platelets by anticoagulation protocol: Yes   Plan:  Decrease heparin to 850 units/hr  Monitor daily heparin level if continued post-LHC, CBC, s/sx of bleeding.  Janae Bridgeman, PharmD PGY1 Pharmacy Resident Phone: [phone] 12/16/2017 9:24 AM

## 2017-12-16 NOTE — Interval H&P Note (Signed)
Cath Lab Visit (complete for each Cath Lab visit)  Clinical Evaluation Leading to the Procedure:   ACS: Yes.    Non-ACS:    Anginal Classification: CCS Hunt  Anti-ischemic medical therapy: Minimal Therapy (1 class of medications)  Non-Invasive Test Results: No non-invasive testing performed  Prior CABG: No previous CABG      History and Physical Interval Note:  12/16/2017 7:10 AM  Monique Hunt  has presented today for surgery, with the diagnosis of nstemi  The various methods of treatment have been discussed with the patient and family. After consideration of risks, benefits and other options for treatment, the patient has consented to  Procedure(s): LEFT HEART CATH AND CORONARY ANGIOGRAPHY (N/A) as a surgical intervention .  The patient's history has been reviewed, patient examined, no change in status, stable for surgery.  I have reviewed the patient's chart and labs.  Questions were answered to the patient's satisfaction.     Monique Hunt

## 2017-12-16 NOTE — Plan of Care (Signed)
  Problem: Education: Goal: Knowledge of General Education information will improve Description Including pain rating scale, medication(s)/side effects and non-pharmacologic comfort measures Outcome: Completed/Met

## 2017-12-16 NOTE — H&P (View-Only) (Signed)
Progress Note  Patient Name: Monique Hunt Date of Encounter: 12/16/2017  Primary Cardiologist:New    Subjective   No CP or back pain  Breathing is OK    Inpatient Medications    Scheduled Meds: . amLODipine  2.5 mg Oral Daily  . aspirin EC  81 mg Oral Daily  . atorvastatin  40 mg Oral q1800  . famotidine  20 mg Oral Daily  . sodium chloride flush  3 mL Intravenous Q12H  . venlafaxine XR  37.5 mg Oral BID   Continuous Infusions: . sodium chloride    . sodium chloride 1 mL/kg/hr (12/15/17 2331)  . heparin 850 Units/hr (12/16/17 1014)   PRN Meds: sodium chloride, acetaminophen, bismuth subsalicylate, nitroGLYCERIN, ondansetron (ZOFRAN) IV, sodium chloride flush   Vital Signs    Vitals:   12/15/17 2000 12/15/17 2131 12/16/17 0604 12/16/17 1014  BP: 127/73 140/79 140/87 (!) 140/96  Pulse: 68 75 69   Resp: 16 16 16    Temp:  97.7 F (36.5 C) 97.8 F (36.6 C)   TempSrc:  Axillary Oral   SpO2: 98% 100% 98%   Weight:  151 lb 11.2 oz (68.8 kg)    Height:  5\' 5"  (1.651 m)      Intake/Output Summary (Last 24 hours) at 12/16/2017 1403 Last data filed at 12/16/2017 0538 Gross per 24 hour  Intake 589.5 ml  Output -  Net 589.5 ml   Filed Weights   12/15/17 0933 12/15/17 2131  Weight: 140 lb (63.5 kg) 151 lb 11.2 oz (68.8 kg)    Telemetry    SR - Personally Reviewed  ECG      Physical Exam   GEN: No acute distress.   Neck: No JVD Cardiac: RRR, no murmurs, rubs, or gallops.  Respiratory: Clear to auscultation bilaterally. GI: Soft, nontender, non-distended  MS: No edema; No deformity. Neuro:  Nonfocal  Psych: Normal affect   Labs    Chemistry Recent Labs  Lab 12/15/17 0934 12/15/17 2206 12/16/17 0714  NA 138  --  142  K 3.4*  --  3.9  CL 101  --  109  CO2 24  --  22  GLUCOSE 89  --  103*  BUN 51*  --  38*  CREATININE 1.53*  --  1.18*  CALCIUM 10.8*  --  9.0  PROT  --  6.2*  --   ALBUMIN  --  3.7  --   AST  --  19  --   ALT  --  20  --    ALKPHOS  --  59  --   BILITOT  --  0.5  --   GFRNONAA 37*  --  50*  GFRAA 43*  --  58*  ANIONGAP 13  --  11     Hematology Recent Labs  Lab 12/15/17 0934 12/16/17 0714  WBC 6.9 5.1  RBC 4.33 3.79*  HGB 13.7 11.9*  HCT 40.8 36.6  MCV 94.2 96.6  MCH 31.6 31.4  MCHC 33.6 32.5  RDW 12.7 13.0  PLT 387 350    Cardiac Enzymes Recent Labs  Lab 12/15/17 2206 12/16/17 0714 12/16/17 1236  TROPONINI 0.09* 0.04* 0.03*    Recent Labs  Lab 12/15/17 1000 12/15/17 1519  TROPIPOC 0.07 0.17*     BNPNo results for input(s): BNP, PROBNP in the last 168 hours.   DDimer  Recent Labs  Lab 12/15/17 1300  DDIMER <0.27     Radiology    Dg Chest 2  View  Result Date: 12/15/2017 CLINICAL DATA:  Chest pain and dizziness. EXAM: CHEST - 2 VIEW COMPARISON:  12/01/2017 FINDINGS: The heart size is normal. There is no pleural effusion or edema identified. Atelectasis is identified within both lung bases. Right lung nodule measuring approximately 8 mm is again noted. A scoliosis deformity is identified with associated multi level degenerative disc disease. IMPRESSION: 1. Bibasilar atelectasis. 2. 8 mm right lung nodule. See CT report from 12/01/2017 for management recommendations. 3. Scoliosis and degenerative disc disease. Electronically Signed   By: Kerby Moors M.D.   On: 12/15/2017 10:26    Cardiac Studies   Cath pending   Patient Profile     57 y.o. female no history of CAD   Presents with CP   Trop 0.17  Assessment & Plan    1  Chest pain/back pain   Plan for L heart cath today   Risk/benefits described   Pt agrees to proceed  2  HL   LDL 199  HDL 45  Trig 323  (A1C 6)   Pt admits to eating fatty foods and carbs  Will await cath results but should be on statin   Discussed diet as well    For questions or updates, please contact [facility] Please consult [email or web address] for contact info under Cardiology/STEMI.      Signed, Dorris Carnes, MD  12/16/2017, 2:03 PM

## 2017-12-16 NOTE — Interval H&P Note (Signed)
Cath Lab Visit (complete for each Cath Lab visit)  Clinical Evaluation Leading to the Procedure:   ACS: Yes.    Non-ACS:    Anginal Classification: CCS III  Anti-ischemic medical therapy: Minimal Therapy (1 class of medications)  Non-Invasive Test Results: No non-invasive testing performed  Prior CABG: No previous CABG      History and Physical Interval Note:  12/16/2017 3:50 PM  Monique Hunt  has presented today for surgery, with the diagnosis of nstemi  The various methods of treatment have been discussed with the patient and family. After consideration of risks, benefits and other options for treatment, the patient has consented to  Procedure(s): LEFT HEART CATH AND CORONARY ANGIOGRAPHY (N/A) as a surgical intervention .  The patient's history has been reviewed, patient examined, no change in status, stable for surgery.  I have reviewed the patient's chart and labs.  Questions were answered to the patient's satisfaction.     Belva Crome III

## 2017-12-17 LAB — CBC
HEMATOCRIT: 37.1 % (ref 36.0–46.0)
HEMOGLOBIN: 12.3 g/dL (ref 12.0–15.0)
MCH: 31.5 pg (ref 26.0–34.0)
MCHC: 33.2 g/dL (ref 30.0–36.0)
MCV: 94.9 fL (ref 78.0–100.0)
Platelets: 350 10*3/uL (ref 150–400)
RBC: 3.91 MIL/uL (ref 3.87–5.11)
RDW: 12.9 % (ref 11.5–15.5)
WBC: 4.4 10*3/uL (ref 4.0–10.5)

## 2017-12-17 LAB — HEPARIN LEVEL (UNFRACTIONATED): Heparin Unfractionated: 0.37 IU/mL (ref 0.30–0.70)

## 2017-12-17 MED ORDER — METOPROLOL TARTRATE 12.5 MG HALF TABLET
12.5000 mg | ORAL_TABLET | Freq: Two times a day (BID) | ORAL | Status: DC
  Administered 2017-12-17 – 2017-12-19 (×5): 12.5 mg via ORAL
  Filled 2017-12-17 (×5): qty 1

## 2017-12-17 NOTE — Plan of Care (Signed)
  Problem: Activity: Goal: Risk for activity intolerance will decrease Outcome: Completed/Met   Problem: Elimination: Goal: Will not experience complications related to bowel motility Outcome: Completed/Met

## 2017-12-17 NOTE — Progress Notes (Signed)
ANTICOAGULATION CONSULT NOTE - Follow Up Consult  Pharmacy Consult for Heparin Indication: chest pain/ACS  No Known Allergies  Patient Measurements: Height: 5\' 5"  (165.1 cm) Weight: 146 lb 3.2 oz (66.3 kg) IBW/kg (Calculated) : 57 Heparin Dosing Weight: 69 kg  Vital Signs: Temp: 97.7 F (36.5 C) (07/20 0457) Temp Source: Oral (07/20 0457) BP: 142/80 (07/20 0918) Pulse Rate: 62 (07/20 0457)  Labs: Recent Labs    12/15/17 0934 12/15/17 2206 12/16/17 0714 12/16/17 1236 12/17/17 0624  HGB 13.7  --  11.9*  --  12.3  HCT 40.8  --  36.6  --  37.1  PLT 387  --  350  --  350  HEPARINUNFRC  --   --  0.71*  --  <0.10*  CREATININE 1.53*  --  1.18*  --   --   TROPONINI  --  0.09* 0.04* 0.03*  --     Estimated Creatinine Clearance: 47.3 mL/min (A) (by C-G formula based on SCr of 1.18 mg/dL (H)).  Assessment:  56 yr old female admitted 12/15/17 with chest pain.  Pharmacy assisting with IV heparin dosing.    On heparin gtt now s/p cath for NSTEMI. Plan to do PCI on 7/22. Heparin level this morning was undetectable drawn ~5hrs after heparin restarted. CBC stable.  Goal of Therapy:  Heparin level 0.3-0.7 units/ml Monitor platelets by anticoagulation protocol: Yes   Plan:  Increase heparin gtt to 950 units/hr Monitor daily heparin level, CBC, s/s of bleed  Elenor Quinones, PharmD, BCPS Clinical Pharmacist Phone number [phone] 12/17/2017 9:37 AM

## 2017-12-17 NOTE — Progress Notes (Addendum)
Progress Note  Patient Name: Monique Hunt Date of Encounter: 12/17/2017  Primary Cardiologist: Dorris Carnes, MD   Subjective   Denies any CP or SOB.   Inpatient Medications    Scheduled Meds: . amLODipine  2.5 mg Oral Daily  . aspirin EC  81 mg Oral Daily  . atorvastatin  80 mg Oral q1800  . clopidogrel  75 mg Oral Daily  . famotidine  20 mg Oral Daily  . sodium chloride flush  3 mL Intravenous Q12H  . venlafaxine XR  37.5 mg Oral BID   Continuous Infusions: . sodium chloride    . heparin 850 Units/hr (12/17/17 0200)   PRN Meds: sodium chloride, acetaminophen, bismuth subsalicylate, nitroGLYCERIN, ondansetron (ZOFRAN) IV, oxyCODONE, sodium chloride flush   Vital Signs    Vitals:   12/16/17 1640 12/16/17 1815 12/16/17 2050 12/17/17 0457  BP: (!) 157/85 (!) 148/90 (!) 138/91 (!) 144/92  Pulse: (!) 0 76 76 62  Resp: (!) 0  18 14  Temp:   98.1 F (36.7 C) 97.7 F (36.5 C)  TempSrc:   Oral Oral  SpO2: 96% 96% 97% 96%  Weight:    146 lb 3.2 oz (66.3 kg)  Height:        Intake/Output Summary (Last 24 hours) at 12/17/2017 0810 Last data filed at 12/17/2017 0235 Gross per 24 hour  Intake 365.44 ml  Output -  Net 365.44 ml   Filed Weights   12/15/17 0933 12/15/17 2131 12/17/17 0457  Weight: 140 lb (63.5 kg) 151 lb 11.2 oz (68.8 kg) 146 lb 3.2 oz (66.3 kg)    Telemetry    NSR with HR 60s - Personally Reviewed  ECG    NSR with TWI in anterior leads - Personally Reviewed  Physical Exam   GEN: No acute distress.   Neck: No JVD Cardiac: RRR, no murmurs, rubs, or gallops.  Respiratory: Clear to auscultation bilaterally. GI: Soft, nontender, non-distended  MS: No edema; No deformity. Neuro:  Nonfocal  Psych: Normal affect   Labs    Chemistry Recent Labs  Lab 12/15/17 0934 12/15/17 2206 12/16/17 0714  NA 138  --  142  K 3.4*  --  3.9  CL 101  --  109  CO2 24  --  22  GLUCOSE 89  --  103*  BUN 51*  --  38*  CREATININE 1.53*  --  1.18*    CALCIUM 10.8*  --  9.0  PROT  --  6.2*  --   ALBUMIN  --  3.7  --   AST  --  19  --   ALT  --  20  --   ALKPHOS  --  59  --   BILITOT  --  0.5  --   GFRNONAA 37*  --  50*  GFRAA 43*  --  58*  ANIONGAP 13  --  11     Hematology Recent Labs  Lab 12/15/17 0934 12/16/17 0714 12/17/17 0624  WBC 6.9 5.1 4.4  RBC 4.33 3.79* 3.91  HGB 13.7 11.9* 12.3  HCT 40.8 36.6 37.1  MCV 94.2 96.6 94.9  MCH 31.6 31.4 31.5  MCHC 33.6 32.5 33.2  RDW 12.7 13.0 12.9  PLT 387 350 350    Cardiac Enzymes Recent Labs  Lab 12/15/17 2206 12/16/17 0714 12/16/17 1236  TROPONINI 0.09* 0.04* 0.03*    Recent Labs  Lab 12/15/17 1000 12/15/17 1519  TROPIPOC 0.07 0.17*     BNPNo results for input(s):  BNP, PROBNP in the last 168 hours.   DDimer  Recent Labs  Lab 12/15/17 1300  DDIMER <0.27     Radiology    Dg Chest 2 View  Result Date: 12/15/2017 CLINICAL DATA:  Chest pain and dizziness. EXAM: CHEST - 2 VIEW COMPARISON:  12/01/2017 FINDINGS: The heart size is normal. There is no pleural effusion or edema identified. Atelectasis is identified within both lung bases. Right lung nodule measuring approximately 8 mm is again noted. A scoliosis deformity is identified with associated multi level degenerative disc disease. IMPRESSION: 1. Bibasilar atelectasis. 2. 8 mm right lung nodule. See CT report from 12/01/2017 for management recommendations. 3. Scoliosis and degenerative disc disease. Electronically Signed   By: Kerby Moors M.D.   On: 12/15/2017 10:26    Cardiac Studies   Cath 12/16/2017  Proximal to mid eccentric 75% hazy LAD stenosis felt to be the culprit for the patient's presentation.  The LAD is otherwise normal with marked mid and distal vessel tortuosity.  Marked tortuosity/angulation of the innominate-aortic arch bifurcation preventing catheter torque and sufficient guide support.  Normal left main.  Normal circumflex with marked tortuosity involving the dominant obtuse  marginal branch.  Dominant right coronary with moderate mid and distal vessel tortuosity and marked tortuosity of the PDA and left ventricular branches.  Normal left ventricular size and function.  EF is 55%.  No regional wall motion abnormality is noted.  Normal LVEDP.   RECOMMENDATIONS:   Patient is loaded with 300 mg of Plavix.  She should continue aspirin and Plavix dual antiplatelet therapy.  Resume IV heparin after radial sheath removal and continue until PCI on Monday morning.  PCI of the proximal to mid LAD from the right femoral approach Monday morning at 9 AM.  Femoral approach is chosen to provide adequate back-up given distal vessel tortuosity and adverse angulation of the innominate and aortic arch.  Recommend dual antiplatelet therapy with Aspirin 81mg  daily and Clopidogrel 75mg  daily long-term (beyond 12 months) because of ACS presentation.  Patient Profile     57 y.o. female with PMH of HTN and depression presented with back pain between shoulder blade. Trop 0.07 --> 0.17. Cath 12/16/2017 showed 75% prox to mid LAD lesion. Pending staged PCI on Monday.   Assessment & Plan    1. NSTEMI  - cath 12/16/2017 showed 75% prox to mid LAD stenosis which was felt to be the culprit lesion. Tortuosity of innominate aortic arch bifurcation prevent catheter torque and guide support. Plan for staged PCI Monday morning via R femoral approach.   - continue ASA and plavix, per cath report, likely will need chronic DAPT beyond 12 month.   2. HTN: continue low dose amlodipine. Add metoprolol 12.5mg  BID  3. AKI: improving  4. HLD: Lipid panel 12/16/2017 Chol 323, HDL 45, LDL 199, Trig 394. Continue 80mg  lipitor, repeat FLT and LFT in 6-8 weeks  5. Pulmonary nodule on recent CT: defer to PCP  6. Thyroid nodules: TSH normal. Defer to PCP   For questions or updates, please contact [facility] Please consult [email or web address] for contact info under Cardiology/STEMI.       Hilbert Corrigan, PA  12/17/2017, 8:10 AM     Patient seen and examined. Agree with assessment and plan. No recurrent chest pain on heparin.  Tortuous innomimate.  R radial cath site stable. Plan for PCI via femoral approach on Monday. BP 142/86; will add BB.  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   Troy Sine, MD, [facility] 12/17/2017 9:40 AM

## 2017-12-17 NOTE — Progress Notes (Signed)
ANTICOAGULATION CONSULT NOTE - Follow Up Consult  Pharmacy Consult for Heparin Indication: chest pain/ACS  No Known Allergies  Patient Measurements: Height: 5\' 5"  (165.1 cm) Weight: 146 lb 3.2 oz (66.3 kg) IBW/kg (Calculated) : 57 Heparin Dosing Weight: 69 kg  Vital Signs: Temp: 98.1 F (36.7 C) (07/20 1615) Temp Source: Oral (07/20 1615) BP: 156/91 (07/20 1615) Pulse Rate: 59 (07/20 1615)  Labs: Recent Labs    12/15/17 0934 12/15/17 2206 12/16/17 0714 12/16/17 1236 12/17/17 0624 12/17/17 1539  HGB 13.7  --  11.9*  --  12.3  --   HCT 40.8  --  36.6  --  37.1  --   PLT 387  --  350  --  350  --   HEPARINUNFRC  --   --  0.71*  --  <0.10* 0.37  CREATININE 1.53*  --  1.18*  --   --   --   TROPONINI  --  0.09* 0.04* 0.03*  --   --     Estimated Creatinine Clearance: 47.3 mL/min (A) (by C-G formula based on SCr of 1.18 mg/dL (H)).  Assessment: 57 yr old female admitted 12/15/17 with chest pain.  Pharmacy assisting with IV heparin dosing.     On heparin gtt now s/p cath for NSTEMI. Plan to do PCI on 7/22.  Heparin level now therapeutic at 0.37. No s/sx of bleeding, CBC stable.  Goal of Therapy:  Heparin level 0.3-0.7 units/ml Monitor platelets by anticoagulation protocol: Yes   Plan:  Continue heparin gtt at 950 units/hr Monitor daily heparin level, CBC, s/s of bleed  Janae Bridgeman, PharmD PGY1 Pharmacy Resident Phone: [phone] 12/17/2017 4:26 PM

## 2017-12-18 DIAGNOSIS — E785 Hyperlipidemia, unspecified: Secondary | ICD-10-CM

## 2017-12-18 LAB — CBC
HCT: 38.7 % (ref 36.0–46.0)
Hemoglobin: 12.9 g/dL (ref 12.0–15.0)
MCH: 31.5 pg (ref 26.0–34.0)
MCHC: 33.3 g/dL (ref 30.0–36.0)
MCV: 94.4 fL (ref 78.0–100.0)
PLATELETS: 384 10*3/uL (ref 150–400)
RBC: 4.1 MIL/uL (ref 3.87–5.11)
RDW: 12.6 % (ref 11.5–15.5)
WBC: 4.5 10*3/uL (ref 4.0–10.5)

## 2017-12-18 LAB — HEPARIN LEVEL (UNFRACTIONATED): HEPARIN UNFRACTIONATED: 0.51 [IU]/mL (ref 0.30–0.70)

## 2017-12-18 MED ORDER — ASPIRIN 81 MG PO CHEW
81.0000 mg | CHEWABLE_TABLET | ORAL | Status: AC
  Administered 2017-12-19: 81 mg via ORAL
  Filled 2017-12-18: qty 1

## 2017-12-18 MED ORDER — CLOPIDOGREL BISULFATE 75 MG PO TABS
150.0000 mg | ORAL_TABLET | ORAL | Status: AC
  Administered 2017-12-19: 150 mg via ORAL
  Filled 2017-12-18: qty 2

## 2017-12-18 MED ORDER — SODIUM CHLORIDE 0.9 % IV SOLN: 250.0000 mL | INTRAVENOUS | Status: DC | PRN

## 2017-12-18 MED ORDER — SODIUM CHLORIDE 0.9 % WEIGHT BASED INFUSION
1.0000 mL/kg/h | INTRAVENOUS | Status: DC
  Administered 2017-12-18: 1 mL/kg/h via INTRAVENOUS

## 2017-12-18 MED ORDER — SODIUM CHLORIDE 0.9% FLUSH
3.0000 mL | Freq: Two times a day (BID) | INTRAVENOUS | Status: DC
  Administered 2017-12-18: 3 mL via INTRAVENOUS

## 2017-12-18 MED ORDER — SODIUM CHLORIDE 0.9% FLUSH: 3.0000 mL | INTRAVENOUS | Status: DC | PRN

## 2017-12-18 NOTE — Progress Notes (Signed)
ANTICOAGULATION CONSULT NOTE - Follow Up Consult  Pharmacy Consult for Heparin Indication: chest pain/ACS  No Known Allergies  Patient Measurements: Height: 5\' 5"  (165.1 cm) Weight: 144 lb 3.2 oz (65.4 kg) IBW/kg (Calculated) : 57 Heparin Dosing Weight: 69 kg  Vital Signs: Temp: 98.4 F (36.9 C) (07/21 0445) Temp Source: Oral (07/21 0445) BP: 147/89 (07/21 0802) Pulse Rate: 65 (07/21 0802)  Labs: Recent Labs    12/15/17 2206  12/16/17 0714 12/16/17 1236 12/17/17 0624 12/17/17 1539 12/18/17 0422  HGB  --    < > 11.9*  --  12.3  --  12.9  HCT  --   --  36.6  --  37.1  --  38.7  PLT  --   --  350  --  350  --  384  HEPARINUNFRC  --    < > 0.71*  --  <0.10* 0.37 0.51  CREATININE  --   --  1.18*  --   --   --   --   TROPONINI 0.09*  --  0.04* 0.03*  --   --   --    < > = values in this interval not displayed.    Estimated Creatinine Clearance: 47.3 mL/min (A) (by C-G formula based on SCr of 1.18 mg/dL (H)).  Assessment: 57 yr old female admitted 12/15/17 with chest pain.  Pharmacy assisting with IV heparin dosing.     On heparin gtt now s/p cath for NSTEMI. Plan to do PCI on 7/22.  Confirmatory heparin level therapeutic today at 0.51. No s/sx of bleeding, CBC WNL  Goal of Therapy:  Heparin level 0.3-0.7 units/ml Monitor platelets by anticoagulation protocol: Yes   Plan:  Continue heparin gtt at 950 units/hr Monitor daily heparin level, CBC, s/s of bleed  Thank you for involving pharmacy in this patient's care.  Janae Bridgeman, PharmD PGY1 Pharmacy Resident Phone: [phone] 12/18/2017 11:13 AM

## 2017-12-18 NOTE — Plan of Care (Signed)
  Problem: Nutrition: Goal: Adequate nutrition will be maintained Outcome: Completed/Met   Problem: Coping: Goal: Level of anxiety will decrease Outcome: Completed/Met   

## 2017-12-18 NOTE — H&P (View-Only) (Signed)
Progress Note  Patient Name: Monique Hunt Date of Encounter: 12/18/2017  Primary Cardiologist: Dorris Carnes, MD   Subjective   Denies any CP or SOB overnight.   Inpatient Medications    Scheduled Meds: . amLODipine  2.5 mg Oral Daily  . aspirin EC  81 mg Oral Daily  . atorvastatin  80 mg Oral q1800  . clopidogrel  75 mg Oral Daily  . famotidine  20 mg Oral Daily  . metoprolol tartrate  12.5 mg Oral BID  . sodium chloride flush  3 mL Intravenous Q12H  . venlafaxine XR  37.5 mg Oral BID   Continuous Infusions: . sodium chloride    . heparin 950 Units/hr (12/17/17 2254)   PRN Meds: sodium chloride, acetaminophen, bismuth subsalicylate, nitroGLYCERIN, ondansetron (ZOFRAN) IV, oxyCODONE, sodium chloride flush   Vital Signs    Vitals:   12/17/17 1615 12/17/17 2101 12/17/17 2102 12/18/17 0445  BP: (!) 156/91 (!) 152/95 (!) 152/95 (!) 137/93  Pulse: (!) 59 65 63 64  Resp: 20 18 18 17   Temp: 98.1 F (36.7 C) 98.4 F (36.9 C) 98.4 F (36.9 C) 98.4 F (36.9 C)  TempSrc: Oral Oral Oral Oral  SpO2:  97% 96% 98%  Weight:    144 lb 3.2 oz (65.4 kg)  Height:        Intake/Output Summary (Last 24 hours) at 12/18/2017 0728 Last data filed at 12/18/2017 7096 Gross per 24 hour  Intake 856.85 ml  Output 400 ml  Net 456.85 ml   Filed Weights   12/15/17 2131 12/17/17 0457 12/18/17 0445  Weight: 151 lb 11.2 oz (68.8 kg) 146 lb 3.2 oz (66.3 kg) 144 lb 3.2 oz (65.4 kg)    Telemetry    NSR with no ventricular ectopy - Personally Reviewed  ECG    NSR with improvement of TWI in anterior leads - Personally Reviewed  Physical Exam   GEN: No acute distress.   Neck: No JVD Cardiac: RRR, no murmurs, rubs, or gallops.  Respiratory: Clear to auscultation bilaterally. GI: Soft, nontender, non-distended  MS: No edema; No deformity. Neuro:  Nonfocal  Psych: Normal affect   Labs    Chemistry Recent Labs  Lab 12/15/17 0934 12/15/17 2206 12/16/17 0714  NA 138  --  142   K 3.4*  --  3.9  CL 101  --  109  CO2 24  --  22  GLUCOSE 89  --  103*  BUN 51*  --  38*  CREATININE 1.53*  --  1.18*  CALCIUM 10.8*  --  9.0  PROT  --  6.2*  --   ALBUMIN  --  3.7  --   AST  --  19  --   ALT  --  20  --   ALKPHOS  --  59  --   BILITOT  --  0.5  --   GFRNONAA 37*  --  50*  GFRAA 43*  --  58*  ANIONGAP 13  --  11     Hematology Recent Labs  Lab 12/16/17 0714 12/17/17 0624 12/18/17 0422  WBC 5.1 4.4 4.5  RBC 3.79* 3.91 4.10  HGB 11.9* 12.3 12.9  HCT 36.6 37.1 38.7  MCV 96.6 94.9 94.4  MCH 31.4 31.5 31.5  MCHC 32.5 33.2 33.3  RDW 13.0 12.9 12.6  PLT 350 350 384    Cardiac Enzymes Recent Labs  Lab 12/15/17 2206 12/16/17 0714 12/16/17 1236  TROPONINI 0.09* 0.04* 0.03*  Recent Labs  Lab 12/15/17 1000 12/15/17 1519  TROPIPOC 0.07 0.17*     BNPNo results for input(s): BNP, PROBNP in the last 168 hours.   DDimer  Recent Labs  Lab 12/15/17 1300  DDIMER <0.27     Radiology    No results found.  Cardiac Studies   Cath 12/16/2017  Proximal to mid eccentric 75% hazy LAD stenosis felt to be the culprit for the patient's presentation.  The LAD is otherwise normal with marked mid and distal vessel tortuosity.  Marked tortuosity/angulation of the innominate-aortic arch bifurcation preventing catheter torque and sufficient guide support.  Normal left main.  Normal circumflex with marked tortuosity involving the dominant obtuse marginal branch.  Dominant right coronary with moderate mid and distal vessel tortuosity and marked tortuosity of the PDA and left ventricular branches.  Normal left ventricular size and function.  EF is 55%.  No regional wall motion abnormality is noted.  Normal LVEDP.   RECOMMENDATIONS:   Patient is loaded with 300 mg of Plavix.  She should continue aspirin and Plavix dual antiplatelet therapy.  Resume IV heparin after radial sheath removal and continue until PCI on Monday morning.  PCI of the proximal  to mid LAD from the right femoral approach Monday morning at 9 AM.  Femoral approach is chosen to provide adequate back-up given distal vessel tortuosity and adverse angulation of the innominate and aortic arch.  Recommend dual antiplatelet therapy with Aspirin 81mg  daily and Clopidogrel 75mg  daily long-term (beyond 12 months) because of ACS presentation.  Patient Profile     57 y.o. female with PMH of HTN and depression presented with back pain between shoulder blade. Trop 0.07 --> 0.17. Cath 12/16/2017 showed 75% prox to mid LAD lesion. Pending staged PCI on Monday.   Assessment & Plan    1. NSTEMI  - cath 12/16/2017 showed 75% prox to mid LAD stenosis which was felt to be the culprit lesion. Tortuosity of innominate aortic arch bifurcation prevent catheter torque and guide support. Plan for staged PCI Monday morning via R femoral approach.   - continue ASA and plavix, per cath report, likely will need chronic DAPT beyond 12 month.  - cath scheduled for tomorrow AM with Dr. Tamala Julian. Risk and benefit of procedure explained to the patient who display clear understanding and agree to proceed.  Discussed with patient possible procedural risk include bleeding, vascular injury, renal injury, arrythmia, MI, stroke and loss of limb or life.  2. HTN: continue low dose amlodipine. Tolerating metoprolol 12.5mg  BID  3. AKI: improving  4. HLD: Lipid panel 12/16/2017 Chol 323, HDL 45, LDL 199, Trig 394. Continue 80mg  lipitor, repeat FLT and LFT in 6-8 weeks  5. Pulmonary nodule on recent CT: defer to PCP  6. Thyroid nodules: TSH normal. Defer to PCP   For questions or updates, please contact [place] Please consult [email or web address] for contact info under Cardiology/STEMI.      Hilbert Corrigan, PA  12/18/2017, 7:28 AM     Patient seen and examined. Agree with assessment and plan. Feels well; no recurrent chest pain on heparin. Now on atorvastatin 80 mg. With LDL 199 suspect possible hereditary  familial hyperlipidemia; no tendon xanthoma.  LDL target < 70, can add zetia to atorva in 2 months and if not at goal then recommend PCSK9 inhibition. For PCI via femoral approach tomorrow. Pt aware of risks/benefits.     Troy Sine, MD, [facility] 12/18/2017 7:59 AM

## 2017-12-18 NOTE — Progress Notes (Addendum)
Progress Note  Patient Name: Monique Hunt Date of Encounter: 12/18/2017  Primary Cardiologist: Dorris Carnes, MD   Subjective   Denies any CP or SOB overnight.   Inpatient Medications    Scheduled Meds: . amLODipine  2.5 mg Oral Daily  . aspirin EC  81 mg Oral Daily  . atorvastatin  80 mg Oral q1800  . clopidogrel  75 mg Oral Daily  . famotidine  20 mg Oral Daily  . metoprolol tartrate  12.5 mg Oral BID  . sodium chloride flush  3 mL Intravenous Q12H  . venlafaxine XR  37.5 mg Oral BID   Continuous Infusions: . sodium chloride    . heparin 950 Units/hr (12/17/17 2254)   PRN Meds: sodium chloride, acetaminophen, bismuth subsalicylate, nitroGLYCERIN, ondansetron (ZOFRAN) IV, oxyCODONE, sodium chloride flush   Vital Signs    Vitals:   12/17/17 1615 12/17/17 2101 12/17/17 2102 12/18/17 0445  BP: (!) 156/91 (!) 152/95 (!) 152/95 (!) 137/93  Pulse: (!) 59 65 63 64  Resp: 20 18 18 17   Temp: 98.1 F (36.7 C) 98.4 F (36.9 C) 98.4 F (36.9 C) 98.4 F (36.9 C)  TempSrc: Oral Oral Oral Oral  SpO2:  97% 96% 98%  Weight:    144 lb 3.2 oz (65.4 kg)  Height:        Intake/Output Summary (Last 24 hours) at 12/18/2017 0728 Last data filed at 12/18/2017 3500 Gross per 24 hour  Intake 856.85 ml  Output 400 ml  Net 456.85 ml   Filed Weights   12/15/17 2131 12/17/17 0457 12/18/17 0445  Weight: 151 lb 11.2 oz (68.8 kg) 146 lb 3.2 oz (66.3 kg) 144 lb 3.2 oz (65.4 kg)    Telemetry    NSR with no ventricular ectopy - Personally Reviewed  ECG    NSR with improvement of TWI in anterior leads - Personally Reviewed  Physical Exam   GEN: No acute distress.   Neck: No JVD Cardiac: RRR, no murmurs, rubs, or gallops.  Respiratory: Clear to auscultation bilaterally. GI: Soft, nontender, non-distended  MS: No edema; No deformity. Neuro:  Nonfocal  Psych: Normal affect   Labs    Chemistry Recent Labs  Lab 12/15/17 0934 12/15/17 2206 12/16/17 0714  NA 138  --  142   K 3.4*  --  3.9  CL 101  --  109  CO2 24  --  22  GLUCOSE 89  --  103*  BUN 51*  --  38*  CREATININE 1.53*  --  1.18*  CALCIUM 10.8*  --  9.0  PROT  --  6.2*  --   ALBUMIN  --  3.7  --   AST  --  19  --   ALT  --  20  --   ALKPHOS  --  59  --   BILITOT  --  0.5  --   GFRNONAA 37*  --  50*  GFRAA 43*  --  58*  ANIONGAP 13  --  11     Hematology Recent Labs  Lab 12/16/17 0714 12/17/17 0624 12/18/17 0422  WBC 5.1 4.4 4.5  RBC 3.79* 3.91 4.10  HGB 11.9* 12.3 12.9  HCT 36.6 37.1 38.7  MCV 96.6 94.9 94.4  MCH 31.4 31.5 31.5  MCHC 32.5 33.2 33.3  RDW 13.0 12.9 12.6  PLT 350 350 384    Cardiac Enzymes Recent Labs  Lab 12/15/17 2206 12/16/17 0714 12/16/17 1236  TROPONINI 0.09* 0.04* 0.03*  Recent Labs  Lab 12/15/17 1000 12/15/17 1519  TROPIPOC 0.07 0.17*     BNPNo results for input(s): BNP, PROBNP in the last 168 hours.   DDimer  Recent Labs  Lab 12/15/17 1300  DDIMER <0.27     Radiology    No results found.  Cardiac Studies   Cath 12/16/2017  Proximal to mid eccentric 75% hazy LAD stenosis felt to be the culprit for the patient's presentation.  The LAD is otherwise normal with marked mid and distal vessel tortuosity.  Marked tortuosity/angulation of the innominate-aortic arch bifurcation preventing catheter torque and sufficient guide support.  Normal left main.  Normal circumflex with marked tortuosity involving the dominant obtuse marginal branch.  Dominant right coronary with moderate mid and distal vessel tortuosity and marked tortuosity of the PDA and left ventricular branches.  Normal left ventricular size and function.  EF is 55%.  No regional wall motion abnormality is noted.  Normal LVEDP.   RECOMMENDATIONS:   Patient is loaded with 300 mg of Plavix.  She should continue aspirin and Plavix dual antiplatelet therapy.  Resume IV heparin after radial sheath removal and continue until PCI on Monday morning.  PCI of the proximal  to mid LAD from the right femoral approach Monday morning at 9 AM.  Femoral approach is chosen to provide adequate back-up given distal vessel tortuosity and adverse angulation of the innominate and aortic arch.  Recommend dual antiplatelet therapy with Aspirin 81mg  daily and Clopidogrel 75mg  daily long-term (beyond 12 months) because of ACS presentation.  Patient Profile     57 y.o. female with PMH of HTN and depression presented with back pain between shoulder blade. Trop 0.07 --> 0.17. Cath 12/16/2017 showed 75% prox to mid LAD lesion. Pending staged PCI on Monday.   Assessment & Plan    1. NSTEMI  - cath 12/16/2017 showed 75% prox to mid LAD stenosis which was felt to be the culprit lesion. Tortuosity of innominate aortic arch bifurcation prevent catheter torque and guide support. Plan for staged PCI Monday morning via R femoral approach.   - continue ASA and plavix, per cath report, likely will need chronic DAPT beyond 12 month.  - cath scheduled for tomorrow AM with Dr. Tamala Julian. Risk and benefit of procedure explained to the patient who display clear understanding and agree to proceed.  Discussed with patient possible procedural risk include bleeding, vascular injury, renal injury, arrythmia, MI, stroke and loss of limb or life.  2. HTN: continue low dose amlodipine. Tolerating metoprolol 12.5mg  BID  3. AKI: improving  4. HLD: Lipid panel 12/16/2017 Chol 323, HDL 45, LDL 199, Trig 394. Continue 80mg  lipitor, repeat FLT and LFT in 6-8 weeks  5. Pulmonary nodule on recent CT: defer to PCP  6. Thyroid nodules: TSH normal. Defer to PCP   For questions or updates, please contact [place] Please consult [email or web address] for contact info under Cardiology/STEMI.      Hilbert Corrigan, PA  12/18/2017, 7:28 AM     Patient seen and examined. Agree with assessment and plan. Feels well; no recurrent chest pain on heparin. Now on atorvastatin 80 mg. With LDL 199 suspect possible hereditary  familial hyperlipidemia; no tendon xanthoma.  LDL target < 70, can add zetia to atorva in 2 months and if not at goal then recommend PCSK9 inhibition. For PCI via femoral approach tomorrow. Pt aware of risks/benefits.     Troy Sine, MD, [facility] 12/18/2017 7:59 AM

## 2017-12-19 ENCOUNTER — Encounter (HOSPITAL_COMMUNITY)
Admission: EM | Disposition: A | Discharge status: Home / Self Care | Payer: Self-pay | Source: Home / Self Care | Attending: Internal Medicine

## 2017-12-19 ENCOUNTER — Encounter (HOSPITAL_COMMUNITY): Payer: Self-pay | Admitting: Interventional Cardiology

## 2017-12-19 DIAGNOSIS — I25118 Atherosclerotic heart disease of native coronary artery with other forms of angina pectoris: Secondary | ICD-10-CM

## 2017-12-19 DIAGNOSIS — I2510 Atherosclerotic heart disease of native coronary artery without angina pectoris: Secondary | ICD-10-CM

## 2017-12-19 HISTORY — PX: CORONARY STENT INTERVENTION: CATH118234

## 2017-12-19 LAB — BASIC METABOLIC PANEL
ANION GAP: 10 (ref 5–15)
BUN: 20 mg/dL (ref 6–20)
CO2: 26 mmol/L (ref 22–32)
Calcium: 9.3 mg/dL (ref 8.9–10.3)
Chloride: 106 mmol/L (ref 98–111)
Creatinine, Ser: 0.98 mg/dL (ref 0.44–1.00)
GFR calc Af Amer: >60 mL/min (ref >60)
GFR calc non Af Amer: >60 mL/min (ref >60)
GLUCOSE: 109 mg/dL — AB (ref 70–99)
POTASSIUM: 4.7 mmol/L (ref 3.5–5.1)
Sodium: 142 mmol/L (ref 135–145)

## 2017-12-19 LAB — POCT ACTIVATED CLOTTING TIME: Activated Clotting Time: 345 seconds

## 2017-12-19 LAB — HEPARIN LEVEL (UNFRACTIONATED): Heparin Unfractionated: 0.44 IU/mL (ref 0.30–0.70)

## 2017-12-19 LAB — CBC
HEMATOCRIT: 38.7 % (ref 36.0–46.0)
Hemoglobin: 12.7 g/dL (ref 12.0–15.0)
MCH: 31.4 pg (ref 26.0–34.0)
MCHC: 32.8 g/dL (ref 30.0–36.0)
MCV: 95.6 fL (ref 78.0–100.0)
PLATELETS: 361 10*3/uL (ref 150–400)
RBC: 4.05 MIL/uL (ref 3.87–5.11)
RDW: 12.5 % (ref 11.5–15.5)
WBC: 5.1 10*3/uL (ref 4.0–10.5)

## 2017-12-19 SURGERY — CORONARY STENT INTERVENTION
Anesthesia: LOCAL

## 2017-12-19 MED ORDER — SODIUM CHLORIDE 0.9 % IV SOLN: 250.0000 mL | INTRAVENOUS | Status: DC | PRN

## 2017-12-19 MED ORDER — CLOPIDOGREL BISULFATE 75 MG PO TABS
75.0000 mg | ORAL_TABLET | Freq: Every day | ORAL | Status: DC
  Administered 2017-12-20: 09:00:00 75 mg via ORAL
  Filled 2017-12-19: qty 1

## 2017-12-19 MED ORDER — THE SENSUOUS HEART BOOK
Freq: Once | Status: AC
  Administered 2017-12-19: 1
  Filled 2017-12-19: qty 1

## 2017-12-19 MED ORDER — FENTANYL CITRATE (PF) 100 MCG/2ML IJ SOLN
INTRAMUSCULAR | Status: AC
  Filled 2017-12-19: qty 2

## 2017-12-19 MED ORDER — SODIUM CHLORIDE 0.9 % IV SOLN: INTRAVENOUS | Status: AC

## 2017-12-19 MED ORDER — LIDOCAINE HCL (PF) 1 % IJ SOLN
INTRAMUSCULAR | Status: DC | PRN
  Administered 2017-12-19: 17 mL

## 2017-12-19 MED ORDER — BIVALIRUDIN TRIFLUOROACETATE 250 MG IV SOLR
INTRAVENOUS | Status: AC
  Filled 2017-12-19: qty 250

## 2017-12-19 MED ORDER — SODIUM CHLORIDE 0.9 % IV SOLN
INTRAVENOUS | Status: DC | PRN
  Administered 2017-12-19: 1.75 mg/kg/h via INTRAVENOUS

## 2017-12-19 MED ORDER — HEPARIN SODIUM (PORCINE) 5000 UNIT/ML IJ SOLN
5000.0000 [IU] | Freq: Three times a day (TID) | INTRAMUSCULAR | Status: DC
  Administered 2017-12-19 – 2017-12-20 (×2): 5000 [IU] via SUBCUTANEOUS
  Filled 2017-12-19 (×2): qty 1

## 2017-12-19 MED ORDER — HEPARIN (PORCINE) IN NACL 1000-0.9 UT/500ML-% IV SOLN
INTRAVENOUS | Status: AC
  Filled 2017-12-19: qty 1000

## 2017-12-19 MED ORDER — SODIUM CHLORIDE 0.9% FLUSH
3.0000 mL | Freq: Two times a day (BID) | INTRAVENOUS | Status: DC
  Administered 2017-12-19: 11:00:00 3 mL via INTRAVENOUS

## 2017-12-19 MED ORDER — ATORVASTATIN CALCIUM 80 MG PO TABS
80.0000 mg | ORAL_TABLET | Freq: Every day | ORAL | Status: DC
  Administered 2017-12-19: 80 mg via ORAL
  Filled 2017-12-19: qty 1

## 2017-12-19 MED ORDER — LABETALOL HCL 5 MG/ML IV SOLN: 10.0000 mg | INTRAVENOUS | Status: AC | PRN

## 2017-12-19 MED ORDER — HYDROCHLOROTHIAZIDE 12.5 MG PO CAPS
12.5000 mg | ORAL_CAPSULE | Freq: Every day | ORAL | Status: DC
  Administered 2017-12-19 – 2017-12-20 (×2): 12.5 mg via ORAL
  Filled 2017-12-19 (×2): qty 1

## 2017-12-19 MED ORDER — ONDANSETRON HCL 4 MG/2ML IJ SOLN
4.0000 mg | Freq: Four times a day (QID) | INTRAMUSCULAR | Status: DC | PRN
  Administered 2017-12-19: 13:00:00 4 mg via INTRAVENOUS
  Filled 2017-12-19: qty 2

## 2017-12-19 MED ORDER — NITROGLYCERIN 1 MG/10 ML FOR IR/CATH LAB
INTRA_ARTERIAL | Status: AC
  Filled 2017-12-19: qty 10

## 2017-12-19 MED ORDER — HYDRALAZINE HCL 20 MG/ML IJ SOLN
5.0000 mg | INTRAMUSCULAR | Status: AC | PRN
  Administered 2017-12-19: 5 mg via INTRAVENOUS
  Filled 2017-12-19: qty 1

## 2017-12-19 MED ORDER — FENTANYL CITRATE (PF) 100 MCG/2ML IJ SOLN
INTRAMUSCULAR | Status: DC | PRN
  Administered 2017-12-19 (×2): 50 ug via INTRAVENOUS

## 2017-12-19 MED ORDER — IOHEXOL 350 MG/ML SOLN
INTRAVENOUS | Status: DC | PRN
  Administered 2017-12-19: 95 mL via INTRA_ARTERIAL

## 2017-12-19 MED ORDER — MIDAZOLAM HCL 2 MG/2ML IJ SOLN
INTRAMUSCULAR | Status: AC
  Filled 2017-12-19: qty 2

## 2017-12-19 MED ORDER — IOPAMIDOL (ISOVUE-370) INJECTION 76%
INTRAVENOUS | Status: AC
  Filled 2017-12-19: qty 125

## 2017-12-19 MED ORDER — LISINOPRIL 40 MG PO TABS
40.0000 mg | ORAL_TABLET | Freq: Every day | ORAL | Status: DC
  Administered 2017-12-19 – 2017-12-20 (×2): 40 mg via ORAL
  Filled 2017-12-19 (×2): qty 1

## 2017-12-19 MED ORDER — LIDOCAINE HCL (PF) 1 % IJ SOLN
INTRAMUSCULAR | Status: AC
  Filled 2017-12-19: qty 30

## 2017-12-19 MED ORDER — HYDRALAZINE HCL 20 MG/ML IJ SOLN
10.0000 mg | Freq: Once | INTRAMUSCULAR | Status: AC
  Administered 2017-12-19: 12:00:00 10 mg via INTRAVENOUS

## 2017-12-19 MED ORDER — BIVALIRUDIN BOLUS VIA INFUSION - CUPID
INTRAVENOUS | Status: DC | PRN
  Administered 2017-12-19: 49.275 mg via INTRAVENOUS

## 2017-12-19 MED ORDER — ACETAMINOPHEN 325 MG PO TABS
650.0000 mg | ORAL_TABLET | ORAL | Status: DC | PRN
  Administered 2017-12-19: 650 mg via ORAL

## 2017-12-19 MED ORDER — NITROGLYCERIN 1 MG/10 ML FOR IR/CATH LAB
INTRA_ARTERIAL | Status: DC | PRN
  Administered 2017-12-19: 200 ug via INTRACORONARY

## 2017-12-19 MED ORDER — SODIUM CHLORIDE 0.9% FLUSH: 3.0000 mL | INTRAVENOUS | Status: DC | PRN

## 2017-12-19 MED ORDER — OXYCODONE HCL 5 MG PO TABS: 5.0000 mg | ORAL_TABLET | ORAL | Status: DC | PRN

## 2017-12-19 MED ORDER — HEPARIN (PORCINE) IN NACL 1000-0.9 UT/500ML-% IV SOLN
INTRAVENOUS | Status: DC | PRN
  Administered 2017-12-19 (×2): 500 mL

## 2017-12-19 MED ORDER — MIDAZOLAM HCL 2 MG/2ML IJ SOLN
INTRAMUSCULAR | Status: DC | PRN
  Administered 2017-12-19 (×2): 1 mg via INTRAVENOUS

## 2017-12-19 MED ORDER — METOPROLOL SUCCINATE ER 25 MG PO TB24
25.0000 mg | ORAL_TABLET | Freq: Every day | ORAL | Status: DC
  Administered 2017-12-20: 09:00:00 25 mg via ORAL
  Filled 2017-12-19: qty 1

## 2017-12-19 MED ORDER — ANGIOPLASTY BOOK
Freq: Once | Status: AC
  Administered 2017-12-19: 1
  Filled 2017-12-19: qty 1

## 2017-12-19 MED ORDER — HEART ATTACK BOUNCING BOOK
Freq: Once | Status: AC
  Administered 2017-12-19: 1
  Filled 2017-12-19: qty 1

## 2017-12-19 MED ORDER — ASPIRIN 81 MG PO CHEW
81.0000 mg | CHEWABLE_TABLET | Freq: Every day | ORAL | Status: DC
  Administered 2017-12-20: 81 mg via ORAL
  Filled 2017-12-19: qty 1

## 2017-12-19 SURGICAL SUPPLY — 14 items
BALLN SAPPHIRE ~~LOC~~ 3.75X8 (BALLOONS) ×2 IMPLANT
CATH VISTA GUIDE 6FR XBLAD3.5 (CATHETERS) ×2 IMPLANT
KIT ENCORE 26 ADVANTAGE (KITS) ×2 IMPLANT
KIT HEART LEFT (KITS) ×2 IMPLANT
KIT HEMO VALVE WATCHDOG (MISCELLANEOUS) ×2 IMPLANT
PACK CARDIAC CATHETERIZATION (CUSTOM PROCEDURE TRAY) ×2 IMPLANT
SHEATH PINNACLE 6F 10CM (SHEATH) ×2 IMPLANT
SHEATH PROBE COVER 6X72 (BAG) ×2 IMPLANT
STENT SYNERGY DES 3.5X12 (Permanent Stent) ×2 IMPLANT
TRANSDUCER W/STOPCOCK (MISCELLANEOUS) ×2 IMPLANT
TUBING CIL FLEX 10 FLL-RA (TUBING) ×2 IMPLANT
WIRE ASAHI PROWATER 180CM (WIRE) ×2 IMPLANT
WIRE COUGAR XT STRL 190CM (WIRE) ×2 IMPLANT
WIRE EMERALD 3MM-J .035X150CM (WIRE) ×2 IMPLANT

## 2017-12-19 NOTE — Progress Notes (Signed)
Right femoral arterial sheath pulled with the assistance of Paulette RN.  Femoral pulse and right dorsalis pedis palpable.  Manual pressure held at site for 20 minutes and pressure dressing applied.  Patient tolerated the sheath pull well.  Site Level 0.    Post-procedure upon sitting up to 30 degrees patient began to look a little light headed.  I asked if she was ok.  She said she thought she was going to be sick. She was reclined slightly, cold rag applied and fanned.  Zofran was given for nausea.  Patient did not get sick but continued to be nauseous.  BP checked and VSS.  No bradycardia or hypotension noted. Site reassessed and looked the same.  Patient requested tylenol for headache.   I have let the nurse who will be monitoring the patient know of this complication.  Bo Merino RN

## 2017-12-19 NOTE — Progress Notes (Signed)
Dr. Tamala Julian paged regarding patient's systolic BP 734Y.  Orders given for 10 mg of hydralazine NOW and repeat same dose in 15 min if systolic >370.   10 mg of hydralazine given prior to sheath removal.

## 2017-12-19 NOTE — Interval H&P Note (Signed)
Cath Lab Visit (complete for each Cath Lab visit)  Clinical Evaluation Leading to the Procedure:   ACS: Yes.    Non-ACS:    Anginal Classification: CCS Hunt  Anti-ischemic medical therapy: Minimal Therapy (1 class of medications)  Non-Invasive Test Results: No non-invasive testing performed  Prior CABG: No previous CABG      History and Physical Interval Note:  12/19/2017 8:09 AM  Monique Hunt  has presented today for surgery, with the diagnosis of cad  The various methods of treatment have been discussed with the patient and family. After consideration of risks, benefits and other options for treatment, the patient has consented to  Procedure(s): CORONARY STENT INTERVENTION (N/A) as a surgical intervention .  The patient's history has been reviewed, patient examined, no change in status, stable for surgery.  I have reviewed the patient's chart and labs.  Questions were answered to the patient's satisfaction.     Monique Hunt

## 2017-12-20 ENCOUNTER — Telehealth: Payer: Self-pay

## 2017-12-20 DIAGNOSIS — I10 Essential (primary) hypertension: Secondary | ICD-10-CM

## 2017-12-20 LAB — BASIC METABOLIC PANEL
Anion gap: 9 (ref 5–15)
BUN: 18 mg/dL (ref 6–20)
CALCIUM: 9.7 mg/dL (ref 8.9–10.3)
CHLORIDE: 107 mmol/L (ref 98–111)
CO2: 22 mmol/L (ref 22–32)
Creatinine, Ser: 0.95 mg/dL (ref 0.44–1.00)
Glucose, Bld: 105 mg/dL — ABNORMAL HIGH (ref 70–99)
POTASSIUM: 4.5 mmol/L (ref 3.5–5.1)
SODIUM: 138 mmol/L (ref 135–145)

## 2017-12-20 LAB — CBC
HEMATOCRIT: 42.2 % (ref 36.0–46.0)
HEMOGLOBIN: 13.7 g/dL (ref 12.0–15.0)
MCH: 31.3 pg (ref 26.0–34.0)
MCHC: 32.5 g/dL (ref 30.0–36.0)
MCV: 96.3 fL (ref 78.0–100.0)
Platelets: 375 10*3/uL (ref 150–400)
RBC: 4.38 MIL/uL (ref 3.87–5.11)
RDW: 12.7 % (ref 11.5–15.5)
WBC: 7 10*3/uL (ref 4.0–10.5)

## 2017-12-20 LAB — HEPARIN LEVEL (UNFRACTIONATED)

## 2017-12-20 MED ORDER — LISINOPRIL 40 MG PO TABS: 40.0000 mg | ORAL_TABLET | Freq: Every day | ORAL | 6 refills | Status: DC

## 2017-12-20 MED ORDER — METOPROLOL SUCCINATE ER 25 MG PO TB24: 25.0000 mg | ORAL_TABLET | Freq: Every day | ORAL | 6 refills | Status: DC

## 2017-12-20 MED ORDER — LISINOPRIL 40 MG PO TABS: 40.0000 mg | ORAL_TABLET | Freq: Every day | ORAL | 5 refills | Status: DC

## 2017-12-20 MED ORDER — ASPIRIN 81 MG PO CHEW: 81.0000 mg | CHEWABLE_TABLET | Freq: Every day | ORAL | 11 refills | Status: DC

## 2017-12-20 MED ORDER — AMLODIPINE BESYLATE 2.5 MG PO TABS: 2.5000 mg | ORAL_TABLET | Freq: Every day | ORAL | 5 refills | Status: DC

## 2017-12-20 MED ORDER — ATORVASTATIN CALCIUM 80 MG PO TABS: 80.0000 mg | ORAL_TABLET | Freq: Every day | ORAL | 5 refills | Status: DC

## 2017-12-20 MED ORDER — CLOPIDOGREL BISULFATE 75 MG PO TABS: 75.0000 mg | ORAL_TABLET | Freq: Every day | ORAL | 12 refills | Status: DC

## 2017-12-20 MED ORDER — HYDROCHLOROTHIAZIDE 12.5 MG PO TABS: 25.0000 mg | ORAL_TABLET | Freq: Every day | ORAL | 5 refills | Status: DC

## 2017-12-20 MED ORDER — ASPIRIN 81 MG PO CHEW: 81.0000 mg | CHEWABLE_TABLET | Freq: Every day | ORAL | Status: DC

## 2017-12-20 MED ORDER — ATORVASTATIN CALCIUM 80 MG PO TABS: 80.0000 mg | ORAL_TABLET | Freq: Every day | ORAL | 6 refills | Status: DC

## 2017-12-20 MED ORDER — HYDROCHLOROTHIAZIDE 12.5 MG PO TABS: 12.5000 mg | ORAL_TABLET | Freq: Every day | ORAL | 5 refills | Status: DC

## 2017-12-20 MED ORDER — CLOPIDOGREL BISULFATE 75 MG PO TABS: 75.0000 mg | ORAL_TABLET | Freq: Every day | ORAL | 11 refills | Status: DC

## 2017-12-20 MED ORDER — METOPROLOL SUCCINATE ER 25 MG PO TB24: 25.0000 mg | ORAL_TABLET | Freq: Every day | ORAL | 5 refills | Status: DC

## 2017-12-20 MED ORDER — NITROGLYCERIN 0.4 MG SL SUBL: 0.4000 mg | SUBLINGUAL_TABLET | SUBLINGUAL | 2 refills | Status: DC | PRN

## 2017-12-20 MED ORDER — NITROGLYCERIN 0.4 MG SL SUBL: 0.4000 mg | SUBLINGUAL_TABLET | SUBLINGUAL | 3 refills | Status: DC | PRN

## 2017-12-20 MED FILL — METOPROLOL SUCCINATE ER 25: 25 | 30 days supply | Qty: 30 | Fill #0

## 2017-12-20 MED FILL — AMLODIPINE 2.5 MG TABLET: 2.5 | 30 days supply | Qty: 30 | Fill #0

## 2017-12-20 MED FILL — CLOPIDOGREL 75 MG TABLET: 75 | 30 days supply | Qty: 30 | Fill #0

## 2017-12-20 MED FILL — HYDROCHLOROTHIAZIDE 12.5 MG: 12.5 | 30 days supply | Qty: 30 | Fill #0

## 2017-12-20 MED FILL — NITROSTAT 0.4 MG TABLET SL: 0.4 | 25 days supply | Qty: 25 | Fill #0

## 2017-12-20 MED FILL — LISINOPRIL 40 MG TABLET: 40 | 30 days supply | Qty: 30 | Fill #0

## 2017-12-20 MED FILL — ATORVASTATIN 80 MG TABLET: 80 | 30 days supply | Qty: 30 | Fill #0

## 2017-12-20 NOTE — Progress Notes (Signed)
CARDIAC REHAB PHASE I   PRE:  Rate/Rhythm: 84 SR  BP:  Supine:   Sitting: 134/80  Standing:    SaO2: 94%RA  MODE:  Ambulation: 800 ft   POST:  Rate/Rhythm: 103 ST  BP:  Supine:   Sitting: 140/84  Standing:    SaO2: 95%RA 0920-1010 Pt walked 800 ft on RA with steady gait and no CP. Tolerated well. MI ed completed and pt voiced understanding. Stressed importance of plavix with stent. Reviewed NTG use, MI restrictions, heart healthy food choices, ex ed and CRP 2. Discussed with pt A1C at 6.0 and that LDL at 199. Pt stated she has really been eating better the last 3 or 4 weeks but before that she was not adhering. Encouraged her to watch carbs and to make sure Medical doctor keeps check on A1C. Referred to [place] CRP 2. Pt stated she should be getting insurance soon.   Graylon Good, RN BSN  12/20/2017 10:08 AM

## 2017-12-20 NOTE — Progress Notes (Addendum)
Progress Note  Patient Name: Monique Hunt Date of Encounter: 12/20/2017  Primary Cardiologist: Dorris Carnes, MD   Subjective   Patient denies any chest pain shortness of breath.  Cath yesterday showed a high-grade 75% proximal LAD lesion and she  underwent  Successful PCI.  Inpatient Medications    Scheduled Meds: . amLODipine  2.5 mg Oral Daily  . aspirin  81 mg Oral Daily  . atorvastatin  80 mg Oral q1800  . clopidogrel  75 mg Oral Q breakfast  . famotidine  20 mg Oral Daily  . heparin  5,000 Units Subcutaneous Q8H  . hydrochlorothiazide  12.5 mg Oral Daily  . lisinopril  40 mg Oral Daily  . metoprolol succinate  25 mg Oral Daily  . sodium chloride flush  3 mL Intravenous Q12H  . venlafaxine XR  37.5 mg Oral BID   Continuous Infusions: . sodium chloride     PRN Meds: sodium chloride, acetaminophen, acetaminophen, bismuth subsalicylate, nitroGLYCERIN, ondansetron (ZOFRAN) IV, oxyCODONE, sodium chloride flush   Vital Signs    Vitals:   12/19/17 1908 12/19/17 2000 12/20/17 0342 12/20/17 0736  BP: 131/66  121/74 126/77  Pulse: 75 77 79 81  Resp: (!) 25 (!) 22 13 18   Temp: 98.4 F (36.9 C)  98.1 F (36.7 C) 97.8 F (36.6 C)  TempSrc: Oral  Oral Oral  SpO2: 96% 98% 97% 100%  Weight:   145 lb 8.1 oz (66 kg)   Height:        Intake/Output Summary (Last 24 hours) at 12/20/2017 0846 Last data filed at 12/20/2017 0700 Gross per 24 hour  Intake 800 ml  Output 2300 ml  Net -1500 ml   Filed Weights   12/18/17 0445 12/19/17 0544 12/20/17 0342  Weight: 144 lb 3.2 oz (65.4 kg) 144 lb 12.8 oz (65.7 kg) 145 lb 8.1 oz (66 kg)    Telemetry    NSR - Personally Reviewed  ECG    No new EKG to review - Personally Reviewed  Physical Exam   GEN: No acute distress.   Neck: No JVD Cardiac: RRR, no murmurs, rubs, or gallops.  Respiratory: Clear to auscultation bilaterally. GI: Soft, nontender, non-distended  MS: No edema; No deformity. Neuro:  Nonfocal  Psych:  Normal affect   Labs    Chemistry Recent Labs  Lab 12/15/17 2206 12/16/17 0714 12/19/17 0650 12/20/17 0322  NA  --  142 142 138  K  --  3.9 4.7 4.5  CL  --  109 106 107  CO2  --  22 26 22   GLUCOSE  --  103* 109* 105*  BUN  --  38* 20 18  CREATININE  --  1.18* 0.98 0.95  CALCIUM  --  9.0 9.3 9.7  PROT 6.2*  --   --   --   ALBUMIN 3.7  --   --   --   AST 19  --   --   --   ALT 20  --   --   --   ALKPHOS 59  --   --   --   BILITOT 0.5  --   --   --   GFRNONAA  --  50* >60 >60  GFRAA  --  58* >60 >60  ANIONGAP  --  11 10 9      Hematology Recent Labs  Lab 12/18/17 0422 12/19/17 0650 12/20/17 0322  WBC 4.5 5.1 7.0  RBC 4.10 4.05 4.38  HGB 12.9 12.7  13.7  HCT 38.7 38.7 42.2  MCV 94.4 95.6 96.3  MCH 31.5 31.4 31.3  MCHC 33.3 32.8 32.5  RDW 12.6 12.5 12.7  PLT 384 361 375    Cardiac Enzymes Recent Labs  Lab 12/15/17 2206 12/16/17 0714 12/16/17 1236  TROPONINI 0.09* 0.04* 0.03*    Recent Labs  Lab 12/15/17 1000 12/15/17 1519  TROPIPOC 0.07 0.17*     BNPNo results for input(s): BNP, PROBNP in the last 168 hours.   DDimer  Recent Labs  Lab 12/15/17 1300  DDIMER <0.27     Radiology    No results found.  Cardiac Studies   Cardiac cath 12/19/2017  A stent was successfully placed.    Successful mid LAD DES reducing a 75% eccentric stenosis proximal to a bifurcation to 0% with TIMI grade III flow.  The stent was a Synergy 3.5 x 12 which was postdilated to 3.75 mm in diameter.   Patient Profile     57 y.o. female with a history of hypertension and depression presented for evaluation of left arm pain by EMS.  Patient was seen in ER for symptoms concerning for angina 12/01/2017.  She had a substernal chest pressure radiating to her back associated with nausea, diaphoresis and shortness of breath.  Relieved with SL nitro x 2.  Left AMA. Patient noted palpitations and not feeling well while at work.  Waxing and waning pain between her shoulder blade.   Exacerbated with minimal movement.  Associated with nausea and shortness of breath.  She also had diaphoresis however works in hot environment.  EMS was called due to episode.   Point-of-care troponin 0.07>>0.17.    Assessment & Plan    1.  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  Hyperlipidemia -LDL was 199 on admission to cholesterol 323 -Continue high-dose atorvastatin 80 mg daily -ALT and FLP in 6 weeks  4.  Hypertension -BP stable at 126/77 -Continue amlodipine 2.5 mg daily, HCTZ 12.5 mg daily, lisinopril 40 mg daily, Toprol XL 25 mg daily  5.  Pulmonary nodule on recent CT -Follow-up with PCP as an outpatient as needed  6.  Thyroid nodules -Follow-up with PCP -TSH normal at 1.937  7.  Acute kidney injury -Resolved after holding diuretic and IV fluids started -Creatinine 0.95 today.  She has been ambulating without symptoms.  She is stable for discharge.  She will need FLP and ALT in 6 weeks.  She will have a TOC follow-up in our office in 7 to 10 days extender.  Needs outpatient follow-up with PCP regarding thyroid nodules as well as pulmonary nodule.  For questions or updates, please contact [place] Please consult [email or web address] for contact info under Cardiology/STEMI.      Signed, Fransico Him, MD  12/20/2017, 8:46 AM

## 2017-12-20 NOTE — Care Management Note (Signed)
Case Management Note  Patient Details  Name: Monique Hunt MRN: [id number] Date of Birth: [birth year]  Subjective/Objective:  From home, s/p stent intervention , will be on plavix.  NCM scheduled hospital follow up for patient at the [facility] on 8/22 at 9:10.  She will go to the [facility] to get her plavix for 10.00.  She will need paper scripts at discharge.                    Action/Plan: DC home when ready.  Expected Discharge Date:                  Expected Discharge Plan:  Home/Self Care  In-House Referral:     Discharge planning Services  CM Consult, Follow-up appt scheduled, Medication Assistance, [facility]  Post Acute Care Choice:    Choice offered to:     DME Arranged:    DME Agency:     HH Arranged:    HH Agency:     Status of Service:  Completed, signed off  If discussed at [employer] of Stay Meetings, dates discussed:    Additional Comments:  Zenon Mayo, RN 12/20/2017, 9:36 AM

## 2017-12-20 NOTE — Telephone Encounter (Signed)
I left a message on the pts VM asking her to call us back.

## 2017-12-20 NOTE — Telephone Encounter (Signed)
**Note De-identified Othello Sgroi Obfuscation** -----  **Note De-Identified Lalonnie Shaffer Obfuscation** Message from Chapman Moss, RN sent at 12/20/2017  5:21 PM EDT ----- Regarding: FW: TOC post hospital f/u   ----- Message ----- From: Consuelo Pandy, PA-C Sent: 12/20/2017   3:01 PM To: [facility] Subject: TOC post hospital f/u                          Please call pt to arrange 7-10 day TOC f/u w/ APP. Primary cardiologist will be Dr. Harrington Challenger. S/p NSTEMI and stent placement

## 2017-12-20 NOTE — Discharge Summary (Signed)
Discharge Summary    Patient ID: Monique Hunt,  MRN: [id number], DOB/AGE: 1961-05-19 57 y.o.  Admit date: 12/15/2017 Discharge date: 12/20/2017  Primary Care Provider: Camille Bal Primary Cardiologist: Dorris Carnes, MD  Discharge Diagnoses    Principal Problem:   NSTEMI (non-ST elevated myocardial infarction) [facility]) Active Problems:   Former tobacco use   Chest pain   Hyperlipidemia LDL goal <70   CAD (coronary artery disease), native coronary artery   Allergies No Known Allergies  Diagnostic Studies/Procedures    Diagnostic Cardiac Cath 12/16/17 Procedures   LEFT HEART CATH AND CORONARY ANGIOGRAPHY  Conclusion    Proximal to mid eccentric 75% hazy LAD stenosis felt to be the culprit for the patient's presentation.  The LAD is otherwise normal with marked mid and distal vessel tortuosity.  Marked tortuosity/angulation of the innominate-aortic arch bifurcation preventing catheter torque and sufficient guide support.  Normal left main.  Normal circumflex with marked tortuosity involving the dominant obtuse marginal branch.  Dominant right coronary with moderate mid and distal vessel tortuosity and marked tortuosity of the PDA and left ventricular branches.  Normal left ventricular size and function.  EF is 55%.  No regional wall motion abnormality is noted.  Normal LVEDP.   RECOMMENDATIONS:   Patient is loaded with 300 mg of Plavix.  She should continue aspirin and Plavix dual antiplatelet therapy.  Resume IV heparin after radial sheath removal and continue until PCI on Monday morning.  PCI of the proximal to mid LAD from the right femoral approach Monday morning at 9 AM.  Femoral approach is chosen to provide adequate back-up given distal vessel tortuosity and adverse angulation of the innominate and aortic arch.  Recommend dual antiplatelet therapy with Aspirin 81mg  daily and Clopidogrel 75mg  daily long-term (beyond 12 months) because of ACS  presentation.   Coronary Stent Intervention 12/19/17 Conclusion     A stent was successfully placed.    Successful mid LAD DES reducing a 75% eccentric stenosis proximal to a bifurcation to 0% with TIMI grade III flow.  The stent was a Synergy 3.5 x 12 which was postdilated to 3.75 mm in diameter.  RECOMMENDATIONS:  Discharge in a.m.  Risk factor modification: Blood pressure 130/80 mmHg or less, hemoglobin A1c less than 70.,  Phase 2 cardiac rehab to incorporate aerobic activity into style.  Recommend uninterrupted dual antiplatelet therapy with Aspirin 81mg  daily and Clopidogrel 75mg  daily for a minimum of 12 months (ACS - Class I recommendation).     History of Present Illness     57 y/o female with a h/o HTN, depression and family h/o CAD (father w/ CABG at age 71) who was initially seen in the ED on 12/01/17 with complaint of CP c/w unstable angina, described as substernal chest pressure radiating to her back associated with nausea, diaphoresis and shortness of breath.  Relieved with SL nitro x 2, however left AMA.  She presented back to the [facility] ED on 7/18 for evaluation of recurrent pain by EMS. This time, pain between shoulder blades. Exacerbated by minimal movement with associated nausea and SOB. Point-of-care troponin 0.07>>0.17.  Potassium 3.4.  Creatinine 1.53 (increased from last evaluation).  Negative d-dimer. She was admitted under general cardiology for NSTEMI.    Hospital Course     Pt admitted by telemetry. Troponin's peaked at 0.17. She underwent initial cardiac cath on 12/16/17 which showed Proximal to mid eccentric 75% hazy LAD stenosis felt to be the culprit for the patient's presentation.  The LAD is otherwise normal with marked mid and distal vessel tortuosity. Marked tortuosity/angulation of the innominate-aortic arch bifurcation preventing catheter torque and sufficient guide support. Normal left main. Normal circumflex with marked tortuosity involving the  dominant obtuse marginal branch. Dominant right coronary with moderate mid and distal vessel tortuosity and marked tortuosity of the PDA and left ventricular branches. LVEDP and LVEF both normal. EF 55%. Pt was loaded with Plavix with plans to perform staged PCI of the proximal to mid LAD from the right femoral approach. Femoral approach was chosen to provide adequate back-up given distal vessel tortuosity and adverse angulation of the innominate and aortic arch. In addition to Plavix, pt was also treated with ASA, high intensity statin, BB and ACE-I. IV heparin was continued until PCI. She was take back to the cath lab for staged intervention of the LAD on 12/19/17. She underwent successful DES placement to LAD and tolerated the procedure well. She left the cath lab in stable condition and had no recurrent CP or scapular pain. She had no post cath complications. Lipid panel showed elevated LDL at 199 mg/dL. She was treated with Lipitor 80 mg and will need repeat FLP and HFTs in 6-8 weeks. Goal LDL <70 mg/dL.   Additional hospital problems included AKI. However this resolved after temporary hold of diuretic and hydration w/ IVFs. SCr improved to normal range at 0.95. There were also incidental findings of thyroid and pulmonary nodules on CT scan (actually done 12/01/17 before pt left AMA). TSH normal at 1.937 Outpatient f/u recommended. Hgb A1c was also checked and borderline at 6.0. Outpatient f/u with primary care has been arranged at [place].   On 12/20/17, pt was seen and examined by Dr. Radford Pax and felt stable for discharge home. She was CP free with stable vital signs. She will need FLP and ALT in 6 weeks.  She will have a TOC follow-up in our office in 7 to 10 days extender.  As noted above, outpatient follow-up with PCP has been arranged at [facility] to help follow thyroid nodules as well as pulmonary nodule.   Home cardiac meds :aspirin 81 mg daily, Lipitor 80 mg, Plavix 75 mg daily,  Toprol-XL 25 mg daily amlodipine 2.5 mg daily, HCTZ 12.5 mg daily, lisinopril 40 mg daily  She will need to continue DAPT w/ ASA and Plavix for at least 12 months.   Consultants: none    Discharge Vitals Blood pressure 130/88, pulse 78, temperature 98.3 F (36.8 C), resp. rate 20, height 5\' 5"  (1.651 m), weight 145 lb 8.1 oz (66 kg), last menstrual period 02/28/2011, SpO2 99 %.  Filed Weights   12/18/17 0445 12/19/17 0544 12/20/17 0342  Weight: 144 lb 3.2 oz (65.4 kg) 144 lb 12.8 oz (65.7 kg) 145 lb 8.1 oz (66 kg)    Labs & Radiologic Studies    CBC Recent Labs    12/19/17 0650 12/20/17 0322  WBC 5.1 7.0  HGB 12.7 13.7  HCT 38.7 42.2  MCV 95.6 96.3  PLT 361 409   Basic Metabolic Panel Recent Labs    12/19/17 0650 12/20/17 0322  NA 142 138  K 4.7 4.5  CL 106 107  CO2 26 22  GLUCOSE 109* 105*  BUN 20 18  CREATININE 0.98 0.95  CALCIUM 9.3 9.7   Liver Function Tests No results for input(s): AST, ALT, ALKPHOS, BILITOT, PROT, ALBUMIN in the last 72 hours. No results for input(s): LIPASE, AMYLASE in the last 72 hours. Cardiac  Enzymes No results for input(s): CKTOTAL, CKMB, CKMBINDEX, TROPONINI in the last 72 hours. BNP Invalid input(s): POCBNP D-Dimer No results for input(s): DDIMER in the last 72 hours. Hemoglobin A1C No results for input(s): HGBA1C in the last 72 hours. Fasting Lipid Panel No results for input(s): CHOL, HDL, LDLCALC, TRIG, CHOLHDL, LDLDIRECT in the last 72 hours. Thyroid Function Tests No results for input(s): TSH, T4TOTAL, T3FREE, THYROIDAB in the last 72 hours.  Invalid input(s): FREET3 _____________  Dg Chest 2 View  Result Date: 12/15/2017 CLINICAL DATA:  Chest pain and dizziness. EXAM: CHEST - 2 VIEW COMPARISON:  12/01/2017 FINDINGS: The heart size is normal. There is no pleural effusion or edema identified. Atelectasis is identified within both lung bases. Right lung nodule measuring approximately 8 mm is again noted. A scoliosis  deformity is identified with associated multi level degenerative disc disease. IMPRESSION: 1. Bibasilar atelectasis. 2. 8 mm right lung nodule. See CT report from 12/01/2017 for management recommendations. 3. Scoliosis and degenerative disc disease. Electronically Signed   By: Kerby Moors M.D.   On: 12/15/2017 10:26   Dg Chest Portable 1 View  Result Date: 12/01/2017 CLINICAL DATA:  Shortness of breath EXAM: PORTABLE CHEST 1 VIEW COMPARISON:  11/11/2012; 04/07/2011; 08/01/2006 FINDINGS: Grossly unchanged cardiac silhouette and mediastinal contours. No focal airspace opacities. No pleural effusion or pneumothorax. No evidence of edema. No acute osseus abnormalities. Moderate scoliotic curvature of the thoracolumbar spine with dominant cranial component convex the right, incompletely evaluated. IMPRESSION: 1.  No acute cardiopulmonary disease. 2. Moderate scoliotic curvature of the thoracolumbar spine. Electronically Signed   By: Sandi Mariscal M.D.   On: 12/01/2017 10:12   Ct Angio Chest/abd/pel For Dissection W And/or Wo Contrast  Result Date: 12/01/2017 CLINICAL DATA:  Chest and neck pain.  Evaluate for dissection. EXAM: CT ANGIOGRAPHY CHEST, ABDOMEN AND PELVIS TECHNIQUE: Multidetector CT imaging through the chest, abdomen and pelvis was performed using the standard protocol during bolus administration of intravenous contrast. Multiplanar reconstructed images and MIPs were obtained and reviewed to evaluate the vascular anatomy. CONTRAST:  159mL ISOVUE-370 IOPAMIDOL (ISOVUE-370) INJECTION 76% COMPARISON:  Abdomen and pelvis CT 09/27/2011 FINDINGS: CTA CHEST FINDINGS Cardiovascular: Pre contrast imaging of the chest shows no thoracic aortic aneurysm. No hyperdense crescent within the wall of the thoracic aorta to suggest acute intramural hematoma. Imaging after IV contrast administration reveals no thoracic aortic dissection. There is no large central pulmonary embolus and no evidence for embolic disease in  lobar pulmonary arteries to either lung. The heart size is normal. No substantial pericardial effusion. Mediastinum/Nodes: No mediastinal lymphadenopathy. There is no hilar lymphadenopathy. The esophagus has normal imaging features. There is no axillary lymphadenopathy. 2.5 cm left thyroid nodule evident. Lungs/Pleura: The central tracheobronchial airways are patent. 9 mm right middle lobe perifissural nodule is associated with a focal area of fissural thickening (well seen sagittal series 13: Image 39). Lungs otherwise clear. Musculoskeletal: Mild thoracolumbar scoliosis. No worrisome lytic or sclerotic osseous abnormality. Review of the MIP images confirms the above findings. CTA ABDOMEN AND PELVIS FINDINGS VASCULAR Aorta: No aneurysm.  No dissection. Celiac: Widely patent. SMA: Widely patent. Renals: Widely patent bilaterally. No evidence for accessory renal artery. IMA: Widely patent Inflow: Normal. Veins: Not well evaluated due to bolus timing with laminar flow of un opacified blood seen in the external and common iliac arteries bilaterally. Review of the MIP images confirms the above findings. NON-VASCULAR Hepatobiliary: No focal abnormality within the liver parenchyma. There is no evidence for gallstones, gallbladder wall  thickening, or pericholecystic fluid. No intrahepatic or extrahepatic biliary dilation. Pancreas: No focal mass lesion. No dilatation of the main duct. No intraparenchymal cyst. No peripancreatic edema. Spleen: No splenomegaly. No focal mass lesion. Adrenals/Urinary Tract: No adrenal nodule or mass. Areas of cortical scarring noted in the kidneys bilaterally. Nonobstructing 2 mm stone identified upper pole right kidney. 17 mm cyst identified upper pole right kidney. Nonobstructing 6 x 10 mm stone noted in the lower pole of the left kidney with a tiny upper pole left renal stone evident. No evidence for hydroureter. The urinary bladder appears normal for the degree of distention.  Stomach/Bowel: Stomach is nondistended. No gastric wall thickening. No evidence of outlet obstruction. Duodenum is normally positioned as is the ligament of Treitz. No small bowel wall thickening. No small bowel dilatation. The terminal ileum is normal. The appendix is normal. Diverticuli are seen scattered along the entire length of the colon without CT findings of diverticulitis. 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 Lymphatic: There is no gastrohepatic or hepatoduodenal ligament lymphadenopathy. No intraperitoneal or retroperitoneal lymphadenopathy. No pelvic sidewall lymphadenopathy. Reproductive: The uterus has normal CT imaging appearance. There is no adnexal mass. Other: No intraperitoneal free fluid. Musculoskeletal: Small umbilical hernia contains only fat. Thoracolumbar scoliosis evident. No worrisome lytic or sclerotic osseous abnormality. Review of the MIP images confirms the above findings. IMPRESSION: 1. 9 mm right middle lobe nodule along the minor fissure with associated focal fissural thickening. Consider one of the following in 3 months for both low-risk and high-risk individuals: (a) repeat chest CT, (b) follow-up PET-CT, or (c) tissue sampling. Given patient age, pulmonary consultation and short-term follow-up PET-CT suggested to ensure that this lesion is not hypermetabolic. This recommendation follows the consensus statement: Guidelines for Management of Incidental Pulmonary Nodules Detected on CT Images: From the Fleischner Society 2017; Radiology 2017; 284:228-243. 2. No thoraco abdominal aortic aneurysm. No dissection of the thoracoabdominal aorta. No clinically significant stenosis of major arterial anatomy in the chest, abdomen, or pelvis. 3. 2.5 cm left thyroid nodule. Thyroid ultrasound recommended to further characterize. 4. Bilateral nonobstructing renal stones. 5. Colonic diverticulosis without diverticulitis. Electronically Signed   By: Misty Stanley M.D.    On: 12/01/2017 11:24   Disposition   Pt is being discharged home today in good condition.  Follow-up Plans & Appointments    Follow-up Information    [place] Follow up on 01/19/2018.   Why:  9:10 for hospital follow up Contact information: [place] [phone] [place] Follow up.   Why:  you can use this pharmacy for medication ast Contact information: [place] [address] [phone]       Fay Records, MD Follow up.   Specialty:  Cardiology Why:  our office will call you with a hospital follow-up visit with cardiology in 7-10 days Contact information: [address] [place] [address] [phone]          Discharge Instructions    Amb Referral to Cardiac Rehabilitation   Complete by:  As directed    Diagnosis:   NSTEMI Coronary Stents     Diet - low sodium heart healthy   Complete by:  As directed    Increase activity slowly   Complete by:  As directed       Discharge Medications   Allergies as  of 12/20/2017   No Known Allergies     Medication List    STOP taking these medications   naproxen sodium 220 MG tablet Commonly known as:  ALEVE     TAKE these medications   amLODipine 2.5 MG tablet Commonly known as:  NORVASC Take 1 tablet (2.5 mg total) by mouth daily.   aspirin 81 MG chewable tablet Chew 1 tablet (81 mg total) by mouth daily. Start taking on:  12/21/2017   atorvastatin 80 MG tablet Commonly known as:  LIPITOR Take 1 tablet (80 mg total) by mouth daily at 6 PM.   bismuth subsalicylate 546 MG chewable tablet Commonly known as:  PEPTO BISMOL Chew by mouth as needed for indigestion.   clopidogrel 75 MG tablet Commonly known as:  PLAVIX Take 1 tablet (75 mg total) by mouth daily with breakfast. Start taking on:  12/21/2017 Notes to patient:   Prevents clotting of stent and heart attack   hydrochlorothiazide 12.5 MG tablet Commonly known as:  HYDRODIURIL Take 1 tablet (12.5 mg total) by mouth daily. What changed:    medication strength  how much to take   lisinopril 40 MG tablet Commonly known as:  PRINIVIL,ZESTRIL Take 1 tablet (40 mg total) by mouth daily.   metoprolol succinate 25 MG 24 hr tablet Commonly known as:  TOPROL-XL Take 1 tablet (25 mg total) by mouth daily. Start taking on:  12/21/2017   nitroGLYCERIN 0.4 MG SL tablet Commonly known as:  NITROSTAT Place 1 tablet (0.4 mg total) under the tongue every 5 (five) minutes x 3 doses as needed for chest pain. Notes to patient:  Place 1 pill under tongue every 5 minutes for chest pain. Call 911 if 2 or more pills are taken   ranitidine 150 MG tablet Commonly known as:  ZANTAC Take 150 mg by mouth daily.   venlafaxine XR 37.5 MG 24 hr capsule Commonly known as:  EFFEXOR-XR Take 37.5 mg by mouth 2 (two) times daily.        Acute coronary syndrome (MI, NSTEMI, STEMI, etc) this admission?: Yes.     AHA/ACC Clinical Performance & Quality Measures: 1. Aspirin prescribed? - Yes 2. ADP Receptor Inhibitor (Plavix/Clopidogrel, Brilinta/Ticagrelor or Effient/Prasugrel) prescribed (includes medically managed patients)? - Yes 3. Beta Blocker prescribed? - Yes 4. High Intensity Statin (Lipitor 40-80mg  or Crestor 20-40mg ) prescribed? - Yes 5. EF assessed during THIS hospitalization? - Yes 6. For EF <40%, was ACEI/ARB prescribed? - Yes 7. For EF <40%, Aldosterone Antagonist (Spironolactone or Eplerenone) prescribed? - Not Applicable (EF >/= 27%) 8. Cardiac Rehab Phase II ordered (Included Medically managed Patients)? - Yes     Outstanding Labs/Studies   F/u FLP and HFTS in 6 weeks.   PCP will need to f/u on pulmonary nodule and thyroid nodules.  Duration of Discharge Encounter   Greater than 30 minutes including physician time.  Signed, Lyda Jester,  PA-C 12/20/2017, 3:00 PM

## 2017-12-21 ENCOUNTER — Telehealth: Payer: Self-pay | Admitting: Cardiology

## 2017-12-21 NOTE — Telephone Encounter (Signed)
Pts TOC appt is scheduled for 12/26/17 at 0930 with Jory Sims NP, at our [place] office.

## 2017-12-21 NOTE — Telephone Encounter (Signed)
Scheduling to move this appt up for TOC 7-14 day follow-up.  Triage to follow-up thereafter.

## 2017-12-21 NOTE — Telephone Encounter (Signed)
New Message:    FYI:  TOC appt with Lenze on 01/10/18 at 11:00 per simmons

## 2017-12-21 NOTE — Telephone Encounter (Signed)
Patient contacted regarding discharge from [facility] on 12/20/17.  Patient understands to follow up with provider Jory Sims NP on 12/26/17 at 0930 at [facility]. Patient understands discharge instructions? yes Patient understands medications and regiment? yes Patient understands to bring all medications to this visit? yes  Pt had no further questions at this time.

## 2017-12-21 NOTE — Telephone Encounter (Signed)
Patient contacted regarding discharge from [facility] on 12/20/17.  Patient understands to follow up with provider Jory Sims NP on 12/26/17 at 0930 at [facility]. Patient understands discharge instructions? yes Patient understands medications and regiment? yes Patient understands to bring all medications to this visit? yes  Pt had no further questions at this time.

## 2017-12-21 NOTE — Telephone Encounter (Addendum)
Sent scheduling dept a message to call the pt back to arrange TOC appt, as indicated by Ellen Henri PA-C. Triage to place [facility] call, once appt made.

## 2017-12-22 ENCOUNTER — Telehealth (HOSPITAL_COMMUNITY): Payer: Self-pay

## 2017-12-22 NOTE — Telephone Encounter (Signed)
Called pt to verify insurance, she stated she does not have insurance at the time and will like to hold off on the referral at this time.  Closed referral

## 2017-12-22 NOTE — Progress Notes (Signed)
Cardiology Office Note   Date:  12/26/2017   ID:  Monique Hunt, DOB 1960/12/21, MRN [id number]  PCP:  Camille Bal, PA-C  Cardiologist:  Dr. Harrington Challenger   Chief Complaint  Patient presents with  . Follow-up  . Headache  . Shortness of Breath  . Chest Pain    Last night.     History of Present Illness: Monique Hunt is a 57 y.o. female who presents for TOC follow up with recent admission for complaints of unstable angina, with associated dyspnea and diaphoresis.   She ultimately required cardiac cath. This revealed 75% hazy LAD stenosis, normal LM, normal Cx with marked tortuosity/angulaiton of the innominate-aortic arch bifurcation. Dominant RCA with moderate mid and distal vessel tortuosity and marked tortuosity of the PDA and LV branches. Normal LVEF of 55%, and normal LVEDP.   She had a staged PCI of the proximal and mid LAD via femoral approach after being loaded with Plavix. She was also treated with ASA, high intensity statin, BB and ACE. She was also noted to have AKI which improved with with temporary hold of diuretics and hydration. Creatinine improved to 0.95. She will need follow up L/L in 6 weeks.   She comes today stating that she feels worse after discharge. She is nauseated, feels tired all of the time and has some dizziness. She is compliant with her medications. She also complains of heartburn after eating, except for when she eats fruit. She also has neck and shoulder pain.   Past Medical History:  Diagnosis Date  . Coronary artery disease   . Depression   . History of kidney stones   . Hypertension   . Migraine    "stopped ~ 2010" (12/15/2017)  . NSTEMI (non-ST elevated myocardial infarction) ([place]) 12/15/2017    Past Surgical History:  Procedure Laterality Date  . CORONARY STENT INTERVENTION N/A 12/19/2017   Procedure: CORONARY STENT INTERVENTION;  Surgeon: Belva Crome, MD;  Location: [facility];  Service: Cardiovascular;  Laterality: N/A;  .  CYSTOSCOPY W/ STONE MANIPULATION    . LEFT HEART CATH AND CORONARY ANGIOGRAPHY N/A 12/16/2017   Procedure: LEFT HEART CATH AND CORONARY ANGIOGRAPHY;  Surgeon: Belva Crome, MD;  Location: [facility];  Service: Cardiovascular;  Laterality: N/A;  . LITHOTRIPSY    . SKIN SURGERY Right    "birthmark removed under my arm when I was little"     Current Outpatient Medications  Medication Sig Dispense Refill  . aspirin 81 MG chewable tablet Chew 1 tablet (81 mg total) by mouth daily. 30 tablet 11  . atorvastatin (LIPITOR) 80 MG tablet Take 1 tablet (80 mg total) by mouth daily at 6 PM. 30 tablet 6  . bismuth subsalicylate (PEPTO BISMOL) 262 MG chewable tablet Chew by mouth as needed for indigestion.    . clopidogrel (PLAVIX) 75 MG tablet Take 1 tablet (75 mg total) by mouth daily with breakfast. 30 tablet 12  . hydrochlorothiazide (HYDRODIURIL) 12.5 MG tablet Take 1 tablet (12.5 mg total) by mouth daily. 30 tablet 5  . lisinopril (PRINIVIL,ZESTRIL) 40 MG tablet Take 1 tablet (40 mg total) by mouth daily. 30 tablet 6  . metoprolol succinate (TOPROL-XL) 25 MG 24 hr tablet Take 1 tablet (25 mg total) by mouth daily. 30 tablet 6  . nitroGLYCERIN (NITROSTAT) 0.4 MG SL tablet Place 1 tablet (0.4 mg total) under the tongue every 5 (five) minutes x 3 doses as needed for chest pain. 25 tablet 3  .  ranitidine (ZANTAC) 150 MG tablet Take 150 mg by mouth daily.    Marland Kitchen venlafaxine XR (EFFEXOR-XR) 37.5 MG 24 hr capsule Take 37.5 mg by mouth 2 (two) times daily.     No current facility-administered medications for this visit.     Allergies:   Patient has no known allergies.    Social History:  The patient  reports that she has never smoked. She has never used smokeless tobacco. She reports that she drinks alcohol. She reports that she does not use drugs.   Family History:  The patient's family history includes CAD in her father.    ROS: All other systems are reviewed and negative. Unless otherwise  mentioned in H&P    PHYSICAL EXAM: VS:  BP 102/68 (BP Location: Left Arm, Patient Position: Sitting, Cuff Size: Normal)   Pulse 77   Ht 5\' 5"  (1.651 m)   Wt 142 lb (64.4 kg)   LMP 02/28/2011   BMI 23.63 kg/m  , BMI Body mass index is 23.63 kg/m. GEN: Well nourished, well developed, in no acute distress  HEENT: normal Neck: no JVD, carotid bruits, or masses Cardiac: RRR; no murmurs, rubs, or gallops,no edema  Respiratory:  clear to auscultation bilaterally, normal work of breathing GI: soft, nontender, nondistended, + BS MS: no deformity or atrophy  Skin: warm and dry, no rash Neuro:  Strength and sensation are intact Psych: euthymic mood, full affect   EKG:  NSR rate of 77 bpm. Mild LVH  Recent Labs: 12/15/2017: ALT 20; TSH 1.937 12/20/2017: BUN 18; Creatinine, Ser 0.95; Hemoglobin 13.7; Platelets 375; Potassium 4.5; Sodium 138    Lipid Panel    Component Value Date/Time   CHOL 323 (H) 12/16/2017 0714   TRIG 394 (H) 12/16/2017 0714   HDL 45 12/16/2017 0714   CHOLHDL 7.2 12/16/2017 0714   VLDL 79 (H) 12/16/2017 0714   LDLCALC 199 (H) 12/16/2017 0714      Wt Readings from Last 3 Encounters:  12/26/17 142 lb (64.4 kg)  12/20/17 145 lb 8.1 oz (66 kg)  12/01/17 140 lb (63.5 kg)      Other studies Reviewed: Diagnostic Cardiac Cath 12/16/17 Procedures   LEFT HEART CATH AND CORONARY ANGIOGRAPHY  Conclusion    Proximal to mid eccentric 75% hazy LAD stenosis felt to be the culprit for the patient's presentation. The LAD is otherwise normal with marked mid and distal vessel tortuosity.  Marked tortuosity/angulation of the innominate-aortic arch bifurcation preventing catheter torque and sufficient guide support.  Normal left main.  Normal circumflex with marked tortuosity involving the dominant obtuse marginal branch.  Dominant right coronary with moderate mid and distal vessel tortuosity and marked tortuosity of the PDA and left ventricular branches.  Normal  left ventricular size and function. EF is 55%. No regional wall motion abnormality is noted. Normal LVEDP.   RECOMMENDATIONS:   Patient is loaded with 300 mg of Plavix. She should continue aspirin and Plavix dual antiplatelet therapy.  Resume IV heparin after radial sheath removal and continue until PCI on Monday morning.  PCI of the proximal to mid LAD from the right femoral approach Monday morning at 9 AM. Femoral approach is chosen to provide adequate back-up given distal vessel tortuosity and adverse angulation of the innominate and aortic arch.  Recommend dual antiplatelet therapy with Aspirin 81mg  daily and Clopidogrel 75mg  dailylong-term (beyond 12 months) because of ACS presentation.   Coronary Stent Intervention 12/19/17 Conclusion     A stent was successfully placed.  Successful mid LAD DES reducing a 75% eccentric stenosis proximal to a bifurcation to 0% with TIMI grade III flow. The stent was a Synergy 3.5 x 12 which was postdilated to 3.75 mm in diameter.  RECOMMENDATIONS:  Discharge in a.m.  Risk factor modification: Blood pressure 130/80 mmHg or less, hemoglobin A1c less than 70., Phase 2 cardiac rehab to incorporate aerobic activity intostyle.  Recommend uninterrupted dual antiplatelet therapy with Aspirin 81mg  daily and Clopidogrel 75mg  dailyfor a minimum of 12 months (ACS - Class I recommendation).      ASSESSMENT AND PLAN:  1. CAD: S/P DES to 75% proximal LAD reducing this to 0%/ She remains on DAPT and is without complaint of recurrent chest pain, bruising or bleeding. She is having some nausea with all of the new medications. I will change her metoprolol succinate to pm dosing to assist with this.   2. Hypertension: BP is soft today. She states it is soft at home as well. I will stop amlodipine 2.5 mg today. Consider stopping HCTZ next if BP remains low. She is to take her BP hat home and record this BID at the same time each day. She  will bring this back in one week for re-evaluation of her response to her medication. Check BMET to evaluate kidney function on ACE and HCTZ.   3. Hypercholesterolemia: She will stay on statin therapy. Recheck lipids and LFT;s in 6 weeks.   4. Nausea and Heartburn: She is on Prilosec and Pepto bismol. She is to see PCP about possible referral to GI.    Current medicines are reviewed at length with the patient today.  I have reviewed her cath illustration and given her a copy of it. I have given her a letter to stay out of work one more week until I see her again for BP check and release her to return to work.    Labs/ tests ordered today include: BMET   Phill Myron. West Pugh, ANP, AACC   12/26/2017 10:17 AM    [facility] [address], [place], [place] [address] Phone: [phone]; Fax: [phone]

## 2017-12-26 ENCOUNTER — Ambulatory Visit (INDEPENDENT_AMBULATORY_CARE_PROVIDER_SITE_OTHER): Payer: Self-pay | Admitting: Adult Health

## 2017-12-26 ENCOUNTER — Encounter: Payer: Self-pay | Admitting: Adult Health

## 2017-12-26 VITALS — BP 102/68 | HR 77 | Ht 65.0 in | Wt 142.0 lb

## 2017-12-26 DIAGNOSIS — I25110 Atherosclerotic heart disease of native coronary artery with unstable angina pectoris: Secondary | ICD-10-CM

## 2017-12-26 DIAGNOSIS — I10 Essential (primary) hypertension: Secondary | ICD-10-CM

## 2017-12-26 DIAGNOSIS — E7800 Pure hypercholesterolemia, unspecified: Secondary | ICD-10-CM

## 2017-12-26 DIAGNOSIS — Z79899 Other long term (current) drug therapy: Secondary | ICD-10-CM

## 2017-12-26 LAB — BASIC METABOLIC PANEL
BUN / CREAT RATIO: 30 — AB (ref 9–23)
BUN: 30 mg/dL — AB (ref 6–24)
CO2: 23 mmol/L (ref 20–29)
CREATININE: 1 mg/dL (ref 0.57–1.00)
Calcium: 10.3 mg/dL — ABNORMAL HIGH (ref 8.7–10.2)
Chloride: 98 mmol/L (ref 96–106)
GFR, EST AFRICAN AMERICAN: 72 mL/min/{1.73_m2} (ref >59)
GFR, EST NON AFRICAN AMERICAN: 63 mL/min/{1.73_m2} (ref >59)
Glucose: 106 mg/dL — ABNORMAL HIGH (ref 65–99)
Potassium: 4.9 mmol/L (ref 3.5–5.2)
Sodium: 138 mmol/L (ref 134–144)

## 2017-12-26 NOTE — Patient Instructions (Signed)
Medication Instructions:  STOP AMLODIPINE  TRY TAKING THE METOPROLOL AT PM/BEDTIME If you need a refill on your cardiac medications before your next appointment, please call your pharmacy.  Labwork: BMET TODAY HERE IN OUR OFFICE AT [facility]  Take the provided lab slips with you to the lab for your blood draw.   Special Instructions: TAKE AND LOG BP TWICE DAILY  Follow-Up: Your physician wants you to follow-up in: [place] (NURSE PRACTIONIER), DNP,AACC IF PRIMARY CARDIOLOGIST IS UNAVAILABLE.   Thank you for choosing [facility] at [facility]!!

## 2018-01-01 NOTE — Progress Notes (Signed)
Cardiology Office Note   Date:  01/02/2018   ID:  Monique Hunt, DOB 1960-07-11, MRN [id number]  PCP:  Camille Bal, PA-C  Cardiologist: Dr. Harrington Challenger Chief Complaint  Patient presents with  . Coronary Artery Disease  . Hypertension     History of Present Illness: Monique Hunt is a 57 y.o. female who presents for ongoing assessment and management of coronary artery disease.  She was recently admitted to the hospital in July 2019 undergoing cardiac catheterization.  Catheterization revealed 75% hazy LAD stenosis, normal left main, normal circumflex with marked tortuosity/angulation of innominate aortic arch bifurcation.  Dominant RCA with moderate mid and distal vessel tortuosity and marked tortuosity of the PDA and LV branches.  Normal LVEF of 55%.  The patient had PCI of the proximal mid LAD and a staged procedure via femoral approach after being loaded with Plavix.  The patient was treated with aspirin high intensity statin beta-blocker and ACE.  On last office visit she continued to complain of feeling worse after discharge she had overall nausea and felt tired all the time.  She was compliant with her medications but she complained of significant heartburn after eating with exception of the time she eats fruits.  She also has chronic neck and shoulder pain.  On that office visit I changed her metoprolol succinate to p.m. dosing to assist with overall fatigue.  Amlodipine 2.5 mg was stopped due to hypotension with consideration stop HCTZ on next office visit if blood pressure remains soft.  She was to take her blood pressures daily and record and bring back her recordings on day of office visit.  She states she is feeling some better, but not as she has hoped, She is anxious and depressed today as she lost her job due to health issues. She is medically complaint. She is disappointed that her energy level has not gotten better.   Past Medical History:  Diagnosis Date  . Coronary  artery disease   . Depression   . History of kidney stones   . Hypertension   . Migraine    "stopped ~ 2010" (12/15/2017)  . NSTEMI (non-ST elevated myocardial infarction) ([place]) 12/15/2017    Past Surgical History:  Procedure Laterality Date  . CORONARY STENT INTERVENTION N/A 12/19/2017   Procedure: CORONARY STENT INTERVENTION;  Surgeon: Belva Crome, MD;  Location: [facility];  Service: Cardiovascular;  Laterality: N/A;  . CYSTOSCOPY W/ STONE MANIPULATION    . LEFT HEART CATH AND CORONARY ANGIOGRAPHY N/A 12/16/2017   Procedure: LEFT HEART CATH AND CORONARY ANGIOGRAPHY;  Surgeon: Belva Crome, MD;  Location: [facility];  Service: Cardiovascular;  Laterality: N/A;  . LITHOTRIPSY    . SKIN SURGERY Right    "birthmark removed under my arm when I was little"     Current Outpatient Medications  Medication Sig Dispense Refill  . aspirin 81 MG chewable tablet Chew 1 tablet (81 mg total) by mouth daily. 30 tablet 11  . atorvastatin (LIPITOR) 80 MG tablet Take 1 tablet (80 mg total) by mouth daily at 6 PM. 30 tablet 6  . bismuth subsalicylate (PEPTO BISMOL) 262 MG chewable tablet Chew by mouth as needed for indigestion.    . clopidogrel (PLAVIX) 75 MG tablet Take 1 tablet (75 mg total) by mouth daily with breakfast. 30 tablet 12  . hydrochlorothiazide (HYDRODIURIL) 12.5 MG tablet Take 1 tablet (12.5 mg total) by mouth daily. 30 tablet 5  . lisinopril (PRINIVIL,ZESTRIL) 40 MG  tablet Take 1 tablet (40 mg total) by mouth daily. 30 tablet 6  . metoprolol succinate (TOPROL-XL) 25 MG 24 hr tablet Take 1 tablet (25 mg total) by mouth daily as needed (MAY TAKE EXTRA 1/2 TAB AS NEEDED). MAY TAKE EXTRA 1/2 TAB AS NEEDED 30 tablet 6  . nitroGLYCERIN (NITROSTAT) 0.4 MG SL tablet Place 1 tablet (0.4 mg total) under the tongue every 5 (five) minutes x 3 doses as needed for chest pain. 25 tablet 3  . ranitidine (ZANTAC) 150 MG tablet Take 150 mg by mouth daily.    Marland Kitchen venlafaxine XR  (EFFEXOR-XR) 37.5 MG 24 hr capsule Take 37.5 mg by mouth 2 (two) times daily.     No current facility-administered medications for this visit.     Allergies:   Patient has no known allergies.    Social History:  The patient  reports that she has never smoked. She has never used smokeless tobacco. She reports that she drinks alcohol. She reports that she does not use drugs.   Family History:  The patient's family history includes CAD in her father.    ROS: All other systems are reviewed and negative. Unless otherwise mentioned in H&P    PHYSICAL EXAM: VS:  BP (!) 143/96   Pulse 74   Ht 5\' 5"  (1.651 m)   Wt 143 lb 9.6 oz (65.1 kg)   LMP 02/28/2011   BMI 23.90 kg/m  , BMI Body mass index is 23.9 kg/m. GEN: Well nourished, well developed, in no acute distress  HEENT: normal  Neck: no JVD, carotid bruits, or masses Cardiac: RRR; no murmurs, rubs, or gallops,no edema  Respiratory:  clear to auscultation bilaterally, normal work of breathing GI: soft, nontender, nondistended, + BS MS: no deformity or atrophy  Skin: warm and dry, no rash Neuro:  Strength and sensation are intact Psych: euthymic mood, full affect, tearful    EKG: Not completed today.   Recent Labs: 12/15/2017: ALT 20; TSH 1.937 12/20/2017: Hemoglobin 13.7; Platelets 375 12/26/2017: BUN 30; Creatinine, Ser 1.00; Potassium 4.9; Sodium 138    Lipid Panel    Component Value Date/Time   CHOL 323 (H) 12/16/2017 0714   TRIG 394 (H) 12/16/2017 0714   HDL 45 12/16/2017 0714   CHOLHDL 7.2 12/16/2017 0714   VLDL 79 (H) 12/16/2017 0714   LDLCALC 199 (H) 12/16/2017 0714      Wt Readings from Last 3 Encounters:  01/02/18 143 lb 9.6 oz (65.1 kg)  12/26/17 142 lb (64.4 kg)  12/20/17 145 lb 8.1 oz (66 kg)      Other studies Reviewed: Cardiac Cath 12/19/2017  A stent was successfully placed.    Successful mid LAD DES reducing a 75% eccentric stenosis proximal to a bifurcation to 0% with TIMI grade III flow.   The stent was a Synergy 3.5 x 12 which was postdilated to 3.75 mm in diameter.  RECOMMENDATIONS:  Discharge in a.m.  Risk factor modification: Blood pressure 130/80 mmHg or less, hemoglobin A1c less than 70.,  Phase 2 cardiac rehab to incorporate aerobic activity into style.  Recommend uninterrupted dual antiplatelet therapy with Aspirin 81mg  daily and75mg  daily for a minimum of 12 months (ACS - Class I recommendation).  ASSESSMENT AND PLAN:  1. CAD: She has been on metoprolol along with Plavix and ASA. She is feeling some better taking BB at night instead of during the day.She states that she is still tired when trying to walk for exercise. She cannot participate in cardiac  rehab due to loss of insurance. She is advised to continue current regimen for now. If symptoms persist she may need to be restudied.   2. Hypertension: She did not tolerate amlodipine due to profound fatigue. This was discontinued. BP is slightly elevated today due to anxiety. 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 She is currently looking for another job.      3. Palpitations: She occasioning feels palpitations. She is advised to take an additional metoprolol 1/2 tablet if necessary if racing heart rate is persistent.   4.  Hypercholesterolemia:  Continue statin therapy.    Current medicines are reviewed at length with the patient today.    Labs/ tests ordered today include: None  Phill Myron. West Pugh, ANP, AACC   01/02/2018 12:49 PM    [facility] [address] [place], [place] [address] Phone: [phone]; Fax: [phone]

## 2018-01-02 ENCOUNTER — Ambulatory Visit (INDEPENDENT_AMBULATORY_CARE_PROVIDER_SITE_OTHER): Payer: Self-pay | Admitting: Adult Health

## 2018-01-02 ENCOUNTER — Encounter: Payer: Self-pay | Admitting: Adult Health

## 2018-01-02 VITALS — BP 143/96 | HR 74 | Ht 65.0 in | Wt 143.6 lb

## 2018-01-02 DIAGNOSIS — I2510 Atherosclerotic heart disease of native coronary artery without angina pectoris: Secondary | ICD-10-CM

## 2018-01-02 DIAGNOSIS — E7800 Pure hypercholesterolemia, unspecified: Secondary | ICD-10-CM

## 2018-01-02 DIAGNOSIS — I10 Essential (primary) hypertension: Secondary | ICD-10-CM

## 2018-01-02 MED ORDER — METOPROLOL SUCCINATE ER 25 MG PO TB24: 25.0000 mg | ORAL_TABLET | Freq: Every day | ORAL | 6 refills | Status: DC | PRN

## 2018-01-02 NOTE — Patient Instructions (Addendum)
  Medication Instructions:  MAKE TAKE EXTRA 1/2 TAB METOPROLOL AS NEEDED  If you need a refill on your cardiac medications before your next appointment, please call your pharmacy.  Follow-Up: Your physician wants you to follow-up in: [place] ([place]), DNP,AACC IF PRIMARY CARDIOLOGIST IS UNAVAILABLE.   Thank you for choosing [facility] at [facility]!!

## 2018-01-03 ENCOUNTER — Telehealth: Payer: Self-pay | Admitting: Adult Health

## 2018-01-03 NOTE — Telephone Encounter (Signed)
Thank you, I agree with having her seen at the hospital or urgent care.

## 2018-01-03 NOTE — Telephone Encounter (Signed)
Pt called to report that she was seen by Jory Sims yesterday and she felt fine but last night around 8pm she developed severe left arm pain and shoulder pain. She said it lasted all night and it hurt so bad that she could not sleep. She says it is still painful today and she said it hurts in her bask also. She denies chest pain, sob, dizziness. She said the pain is worse with movement. I strongly advised her to be seen today if at urgent care or ER. When she had her MI 7/19 her symptoms were pain in her back but different than this pain, She is also complaining of being very nauseated. Pt says that she is going to call her PMD and if cannot get in there she will have someone take her to the Urgent care or the ER.

## 2018-01-05 ENCOUNTER — Institutional Professional Consult (permissible substitution) (INDEPENDENT_AMBULATORY_CARE_PROVIDER_SITE_OTHER): Payer: Self-pay | Admitting: Cardiothoracic Surgery

## 2018-01-05 ENCOUNTER — Other Ambulatory Visit: Payer: Self-pay

## 2018-01-05 ENCOUNTER — Other Ambulatory Visit: Payer: Self-pay | Admitting: *Deleted

## 2018-01-05 ENCOUNTER — Encounter: Payer: Self-pay | Admitting: Cardiothoracic Surgery

## 2018-01-05 VITALS — BP 153/95 | HR 93 | Resp 16 | Ht 65.0 in | Wt 143.0 lb

## 2018-01-05 DIAGNOSIS — R918 Other nonspecific abnormal finding of lung field: Secondary | ICD-10-CM

## 2018-01-05 DIAGNOSIS — I214 Non-ST elevation (NSTEMI) myocardial infarction: Secondary | ICD-10-CM

## 2018-01-05 NOTE — Progress Notes (Signed)
[address]       [place] [address]             [place] Record #[id number] Date of Birth: [birth year]  Referring: Camille Bal, PA-C Primary Care: Kieth Brightly Primary Cardiologist: Dorris Carnes, MD  Chief Complaint:    Chief Complaint  Patient presents with  . Lung Lesion    per CTA CHEST 12/01/17 while IP for NSTEMI/stent...CATH 12/19/17    History of Present Illness:    Monique Hunt 57 y.o. female is seen in the office  today for abnormal CT scan of the chest done on July 4.  The patient notes that she went to the [facility] emergency room July 4 with chest and arm pain.  CT scan was performed to rule out aortic dissection or aneurysm.  She was discharged home that day from the emergency room and returned on July 18 with a non-STEMI myocardial infarction.  She underwent stent placement to the LAD.  Patient is a lifelong non-smoker and has no history of diabetes.   She notes that she was fired from her job last Friday because of health issues.  She called cardiology 2 days ago noting recurrent chest discomfort and especially left neck and left arm pain and was instructed to go to the emergency room.  She noted the pain improved so she did not go.  Today in office the patient was comfortable denied chest pain or shortness of breath.  Previous CT scan of the chest for comparison done in 2005.  Current Activity/ Functional Status:  Patient is independent with mobility/ambulation, transfers, ADL's, IADL's.   Zubrod Score: At the time of surgery this patient's most appropriate activity status/level should be described as: [x]     0    Normal activity, no symptoms []     1    Restricted in physical strenuous activity but ambulatory, able to do out light work []     2    Ambulatory and capable of self care, unable to do work activities, up and about               >50 % of waking hours                               []     3    Only limited self care, in bed greater than 50% of waking hours []     4    Completely disabled, no self care, confined to bed or chair []     5    Moribund   Past Medical History:  Diagnosis Date  . Coronary artery disease   . Depression   . History of kidney stones   . Hypertension   . Migraine    "stopped ~ 2010" (12/15/2017)  . NSTEMI (non-ST elevated myocardial infarction) ([place]) 12/15/2017    Past Surgical History:  Procedure Laterality Date  . CORONARY STENT INTERVENTION N/A 12/19/2017   Procedure: CORONARY STENT INTERVENTION;  Surgeon: Belva Crome, MD;  Location: [facility];  Service: Cardiovascular;  Laterality: N/A;  . CYSTOSCOPY W/ STONE MANIPULATION    . LEFT HEART CATH AND CORONARY ANGIOGRAPHY N/A 12/16/2017   Procedure: LEFT HEART CATH AND CORONARY ANGIOGRAPHY;  Surgeon: Belva Crome,  MD;  Location: [facility];  Service: Cardiovascular;  Laterality: N/A;  . LITHOTRIPSY    . SKIN SURGERY Right    "birthmark removed under my arm when I was little"    Family History  Problem Relation Age of Onset  . CAD Father   Patient aunt died of lung cancer she was a long-term smoker She has 1 brother one sister healthy She has 2 children healthy  Social History   Tobacco Use  Smoking Status Never Smoker  Smokeless Tobacco Never Used    Social History   Substance and Sexual Activity  Alcohol Use Yes   Comment: 12/15/2017 "couple mixed drinks/month"     No Known Allergies  Current Outpatient Medications  Medication Sig Dispense Refill  . aspirin 81 MG chewable tablet Chew 1 tablet (81 mg total) by mouth daily. 30 tablet 11  . atorvastatin (LIPITOR) 80 MG tablet Take 1 tablet (80 mg total) by mouth daily at 6 PM. 30 tablet 6  . clopidogrel (PLAVIX) 75 MG tablet Take 1 tablet (75 mg total) by mouth daily with breakfast. 30 tablet 12  . esomeprazole (NEXIUM) 40 MG capsule Take 40 mg by mouth daily.    .  hydrochlorothiazide (HYDRODIURIL) 12.5 MG tablet Take 1 tablet (12.5 mg total) by mouth daily. 30 tablet 5  . lisinopril (PRINIVIL,ZESTRIL) 40 MG tablet Take 1 tablet (40 mg total) by mouth daily. 30 tablet 6  . metoprolol succinate (TOPROL-XL) 25 MG 24 hr tablet Take 1 tablet (25 mg total) by mouth daily as needed (MAY TAKE EXTRA 1/2 TAB AS NEEDED). MAY TAKE EXTRA 1/2 TAB AS NEEDED 30 tablet 6  . nitroGLYCERIN (NITROSTAT) 0.4 MG SL tablet Place 1 tablet (0.4 mg total) under the tongue every 5 (five) minutes x 3 doses as needed for chest pain. 25 tablet 3  . venlafaxine XR (EFFEXOR-XR) 37.5 MG 24 hr capsule Take 37.5 mg by mouth 2 (two) times daily.     No current facility-administered medications for this visit.     Pertinent items are noted in HPI.   Review of Systems:     Cardiac Review of Systems: [Y] = yes  or   [ N ] = no   Chest Pain [ y   ]  Resting SOB [ n ] Exertional SOB  [ y ]  Orthopnea [ n ]   Pedal Edema [n   ]    Palpitations Blue.Reese  ] Syncope  [ n ]   Presyncope [ n  ]   General Review of Systems: [Y] = yes [  ]=no Constitional: recent weight change [  ];  Wt loss over the last 3 months [   ] anorexia [  ]; fatigue [  ]; nausea [  ]; night sweats [  ]; fever [  ]; or chills [  ];           Eye : blurred vision [  ]; diplopia [   ]; vision changes [  ];  Amaurosis fugax[  ]; Resp: cough [  ];  wheezing[  ];  hemoptysis[  ]; shortness of breath[  ]; paroxysmal nocturnal dyspnea[  ]; dyspnea on exertion[  ]; or orthopnea[  ];  GI:  gallstones[  ], vomiting[  ];  dysphagia[  ]; melena[  ];  hematochezia [  ]; heartburn[  ];   Hx of  Colonoscopy[  ]; GU: kidney stones [  ]; hematuria[  ];   dysuria [  ];  nocturia[  ];  history of     obstruction [  ]; urinary frequency [  ]             Skin: rash, swelling[  ];, hair loss[  ];  peripheral edema[  ];  or itching[  ]; Musculosketetal: myalgias[  ];  joint swelling[  ];  joint erythema[  ];  joint pain[  ];  back pain[  ];  Heme/Lymph:  bruising[  ];  bleeding[  ];  anemia[  ];  Neuro: TIA[  ];  headaches[  ];  stroke[  ];  vertigo[  ];  seizures[  ];   paresthesias[  ];  difficulty walking[  ];  Psych:depression[  ]; anxiety[ y ];  Endocrine: diabetes[  ];  thyroid dysfunction[  ];  Immunizations: Flu up to date [ n ]; Pneumococcal up to date [ n ];  Other:    PHYSICAL EXAMINATION: BP (!) 153/95 (BP Location: Right Arm, Patient Position: Sitting, Cuff Size: Normal)   Pulse 93   Resp 16   Ht 5\' 5"  (1.651 m)   Wt 143 lb (64.9 kg)   LMP 02/28/2011   BMI 23.80 kg/m  General appearance: alert, cooperative, appears stated age and no distress Head: Normocephalic, without obvious abnormality, atraumatic Neck: no adenopathy, no carotid bruit, no JVD, supple, symmetrical, trachea midline and thyroid not enlarged, symmetric, no tenderness/mass/nodules Lymph nodes: Cervical, supraclavicular, and axillary nodes normal. Resp: clear to auscultation bilaterally Back: symmetric, no curvature. ROM normal. No CVA tenderness. Cardio: regular rate and rhythm, S1, S2 normal, no murmur, click, rub or gallop GI: soft, non-tender; bowel sounds normal; no masses,  no organomegaly Extremities: extremities normal, atraumatic, no cyanosis or edema Neurologic: Grossly normal Patient has palpable pulses DP and PT bilaterally 2+ Specifically the left thyroid nodule was not palpable  Diagnostic Studies & Laboratory data:     Recent Radiology Findings:   Dg Chest 2 View  Result Date: 12/15/2017 CLINICAL DATA:  Chest pain and dizziness. EXAM: CHEST - 2 VIEW COMPARISON:  12/01/2017 FINDINGS: The heart size is normal. There is no pleural effusion or edema identified. Atelectasis is identified within both lung bases. Right lung nodule measuring approximately 8 mm is again noted. A scoliosis deformity is identified with associated multi level degenerative disc disease. IMPRESSION: 1. Bibasilar atelectasis. 2. 8 mm right lung nodule. See CT report  from 12/01/2017 for management recommendations. 3. Scoliosis and degenerative disc disease. Electronically Signed   By: Kerby Moors M.D.   On: 12/15/2017 10:26    CT - r/o dissection CLINICAL DATA:  Chest and neck pain.  Evaluate for dissection.  EXAM: CT ANGIOGRAPHY CHEST, ABDOMEN AND PELVIS  TECHNIQUE: Multidetector CT imaging through the chest, abdomen and pelvis was performed using the standard protocol during bolus administration of intravenous contrast. Multiplanar reconstructed images and MIPs were obtained and reviewed to evaluate the vascular anatomy.  CONTRAST:  151mL ISOVUE-370 IOPAMIDOL (ISOVUE-370) INJECTION 76%  COMPARISON:  Abdomen and pelvis CT 09/27/2011  FINDINGS: CTA CHEST FINDINGS  Cardiovascular: Pre contrast imaging of the chest shows no thoracic aortic aneurysm. No hyperdense crescent within the wall of the thoracic aorta to suggest acute intramural hematoma.  Imaging after IV contrast administration reveals no thoracic aortic dissection. There is no large central pulmonary embolus and no evidence for embolic disease in lobar pulmonary arteries to either lung. The heart size is normal. No substantial pericardial effusion.  Mediastinum/Nodes: No mediastinal lymphadenopathy. There is no hilar lymphadenopathy. The esophagus has  normal imaging features. There is no axillary lymphadenopathy. 2.5 cm left thyroid nodule evident.  Lungs/Pleura: The central tracheobronchial airways are patent. 9 mm right middle lobe perifissural nodule is associated with a focal area of fissural thickening (well seen sagittal series 13: Image 39). Lungs otherwise clear.  Musculoskeletal: Mild thoracolumbar scoliosis. No worrisome lytic or sclerotic osseous abnormality.  Review of the MIP images confirms the above findings.  CTA ABDOMEN AND PELVIS FINDINGS  VASCULAR  Aorta: No aneurysm.  No dissection.  Celiac: Widely patent.  SMA: Widely  patent.  Renals: Widely patent bilaterally. No evidence for accessory renal artery.  IMA: Widely patent  Inflow: Normal.  Veins: Not well evaluated due to bolus timing with laminar flow of un opacified blood seen in the external and common iliac arteries bilaterally.  Review of the MIP images confirms the above findings.  NON-VASCULAR  Hepatobiliary: No focal abnormality within the liver parenchyma. There is no evidence for gallstones, gallbladder wall thickening, or pericholecystic fluid. No intrahepatic or extrahepatic biliary dilation.  Pancreas: No focal mass lesion. No dilatation of the main duct. No intraparenchymal cyst. No peripancreatic edema.  Spleen: No splenomegaly. No focal mass lesion.  Adrenals/Urinary Tract: No adrenal nodule or mass. Areas of cortical scarring noted in the kidneys bilaterally. Nonobstructing 2 mm stone identified upper pole right kidney. 17 mm cyst identified upper pole right kidney. Nonobstructing 6 x 10 mm stone noted in the lower pole of the left kidney with a tiny upper pole left renal stone evident. No evidence for hydroureter. The urinary bladder appears normal for the degree of distention.  Stomach/Bowel: Stomach is nondistended. No gastric wall thickening. No evidence of outlet obstruction. Duodenum is normally positioned as is the ligament of Treitz. No small bowel wall thickening. No small bowel dilatation. The terminal ileum is normal. The appendix is normal. Diverticuli are seen scattered along the entire length of the colon without CT findings of diverticulitis. 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  Lymphatic: There is no gastrohepatic or hepatoduodenal ligament lymphadenopathy. No intraperitoneal or retroperitoneal lymphadenopathy. No pelvic sidewall lymphadenopathy.  Reproductive: The uterus has normal CT imaging appearance. There is no adnexal mass.  Other: No intraperitoneal  free fluid.  Musculoskeletal: Small umbilical hernia contains only fat. Thoracolumbar scoliosis evident. No worrisome lytic or sclerotic osseous abnormality.  Review of the MIP images confirms the above findings.  IMPRESSION: 1. 9 mm right middle lobe nodule along the minor fissure with associated focal fissural thickening. Consider one of the following in 3 months for both low-risk and high-risk individuals: (a) repeat chest CT, (b) follow-up PET-CT, or (c) tissue sampling. Given patient age, pulmonary consultation and short-term follow-up PET-CT suggested to ensure that this lesion is not hypermetabolic. This recommendation follows the consensus statement: Guidelines for Management of Incidental Pulmonary Nodules Detected on CT Images: From the Fleischner Society 2017; Radiology 2017; 284:228-243. 2. No thoraco abdominal aortic aneurysm. No dissection of the thoracoabdominal aorta. No clinically significant stenosis of major arterial anatomy in the chest, abdomen, or pelvis. 3. 2.5 cm left thyroid nodule. Thyroid ultrasound recommended to further characterize. 4. Bilateral nonobstructing renal stones. 5. Colonic diverticulosis without diverticulitis.   Electronically Signed   By: Misty Stanley M.D.   On: 12/01/2017 11:24 I have independently reviewed the above radiology studies  and reviewed the findings with the patient.   Recent Lab Findings: Lab Results  Component Value Date   WBC 7.0 12/20/2017   HGB 13.7 12/20/2017   HCT  42.2 12/20/2017   PLT 375 12/20/2017   GLUCOSE 106 (H) 12/26/2017   CHOL 323 (H) 12/16/2017   TRIG 394 (H) 12/16/2017   HDL 45 12/16/2017   LDLCALC 199 (H) 12/16/2017   ALT 20 12/15/2017   AST 19 12/15/2017   NA 138 12/26/2017   K 4.9 12/26/2017   CL 98 12/26/2017   CREATININE 1.00 12/26/2017   BUN 30 (H) 12/26/2017   CO2 23 12/26/2017   TSH 1.937 12/15/2017   HGBA1C 6.0 (H) 12/15/2017   Recent Cath/PCI- Dr Linard Millers LEFT HEART CATH  AND CORONARY ANGIOGRAPHY  Conclusion    Proximal to mid eccentric 75% hazy LAD stenosis felt to be the culprit for the patient's presentation. The LAD is otherwise normal with marked mid and distal vessel tortuosity.  Marked tortuosity/angulation of the innominate-aortic arch bifurcation preventing catheter torque and sufficient guide support.  Normal left main.  Normal circumflex with marked tortuosity involving the dominant obtuse marginal branch.  Dominant right coronary with moderate mid and distal vessel tortuosity and marked tortuosity of the PDA and left ventricular branches.  Normal left ventricular size and function. EF is 55%. No regional wall motion abnormality is noted. Normal LVEDP.   RECOMMENDATIONS:   Patient is loaded with 300 mg of Plavix. She should continue aspirin and Plavix dual antiplatelet therapy.  Resume IV heparin after radial sheath removal and continue until PCI on Monday morning.  PCI of the proximal to mid LAD from the right femoral approach Monday morning at 9 AM. Femoral approach is chosen to provide adequate back-up given distal vessel tortuosity and adverse angulation of the innominate and aortic arch.  Recommend dual antiplatelet therapy with Aspirin 81mg  daily and Clopidogrel 75mg  dailylong-term (beyond 12 months) because of ACS presentation.   Coronary Stent Intervention 12/19/17 Conclusion     A stent was successfully placed.   Successful mid LAD DES reducing a 75% eccentric stenosis proximal to a bifurcation to 0% with TIMI grade III flow. The stent was a Synergy 3.5 x 12 which was postdilated to 3.75 mm in diameter.  RECOMMENDATIONS:  Discharge in a.m.  Risk factor modification: Blood pressure 130/80 mmHg or less, hemoglobin A1c less than 70., Phase 2 cardiac rehab to incorporate aerobic activity intostyle.  Recommend uninterrupted dual antiplatelet therapy with Aspirin 81mg  daily and Clopidogrel 75mg  dailyfor a  minimum of 12 months (ACS - Class I recommendation).      Assessment / Plan:   1/ CAD-   mid LAD DES reducing a 75% eccentric stenosis proximal to a bifurcation to 0% with TIMI grade III flow. 12/19/2017  The stent was a Synergy 3.5 x 12 which was postdilated to 3.75 mm in diameter. Cardiology recommends - uninterrupted dual antiplatelet therapy with Aspirin 81mg  daily and Clopidogrel 75mg  dailyfor a minimum of 12 months  -close follow-up by cardiology for recurrent symptoms  2/ 9 mm right middle lobe perifissural nodule is associated with a focal area of fissural thickening  - indeterminate and a lifelong non-smoker.  At this point with recent stent placement, non-smoker we will follow the right lung nodule with CT scan, plan is for 3 months, the current lesion is small enough that PET scan would not be reliably helpful.  The lesion was not present in 2005 on CT  3/ 2.5 cm left thyroid nodule.-Patient will contact primary care about evaluation of her thyroid function and left thyroid nodule.  Plan to see back in 3 months with a follow-up CT scan of the  chest     I  spent 45 minutes with  the patient face to face and greater then 50% of the time was spent in counseling and coordination of care.    Grace Isaac MD      [phone] [place] [id number] Office [phone]   Beeper [phone]  01/05/2018 10:16 AM

## 2018-01-09 ENCOUNTER — Encounter: Payer: Self-pay | Admitting: Cardiothoracic Surgery

## 2018-01-10 ENCOUNTER — Ambulatory Visit: Payer: Self-pay | Admitting: Physician Assistant

## 2018-01-18 MED FILL — CLOPIDOGREL 75 MG TABLET: 75 | 30 days supply | Qty: 30 | Fill #1

## 2018-01-18 MED FILL — METOPROLOL SUCCINATE ER 25: 25 | 30 days supply | Qty: 30 | Fill #1

## 2018-01-18 MED FILL — ATORVASTATIN 80 MG TABLET: 80 | 30 days supply | Qty: 30 | Fill #1

## 2018-01-18 MED FILL — HYDROCHLOROTHIAZIDE 12.5 MG: 12.5 | 30 days supply | Qty: 30 | Fill #1

## 2018-01-19 ENCOUNTER — Ambulatory Visit (INDEPENDENT_AMBULATORY_CARE_PROVIDER_SITE_OTHER): Payer: Self-pay | Admitting: Physician Assistant

## 2018-01-19 ENCOUNTER — Encounter (INDEPENDENT_AMBULATORY_CARE_PROVIDER_SITE_OTHER): Payer: Self-pay

## 2018-02-10 ENCOUNTER — Emergency Department (HOSPITAL_COMMUNITY): Payer: Self-pay

## 2018-02-10 ENCOUNTER — Emergency Department (HOSPITAL_COMMUNITY)
Admission: EM | Admit: 2018-02-10 | Discharge: 2018-02-10 | Disposition: A | Discharge status: Home / Self Care | Payer: Self-pay | Attending: Emergency Medicine | Admitting: Emergency Medicine

## 2018-02-10 ENCOUNTER — Other Ambulatory Visit: Payer: Self-pay

## 2018-02-10 ENCOUNTER — Encounter (HOSPITAL_COMMUNITY): Payer: Self-pay

## 2018-02-10 DIAGNOSIS — R079 Chest pain, unspecified: Secondary | ICD-10-CM | POA: Insufficient documentation

## 2018-02-10 DIAGNOSIS — Z7902 Long term (current) use of antithrombotics/antiplatelets: Secondary | ICD-10-CM | POA: Insufficient documentation

## 2018-02-10 DIAGNOSIS — Z7982 Long term (current) use of aspirin: Secondary | ICD-10-CM | POA: Insufficient documentation

## 2018-02-10 DIAGNOSIS — Z79899 Other long term (current) drug therapy: Secondary | ICD-10-CM | POA: Insufficient documentation

## 2018-02-10 DIAGNOSIS — I10 Essential (primary) hypertension: Secondary | ICD-10-CM | POA: Insufficient documentation

## 2018-02-10 DIAGNOSIS — I2510 Atherosclerotic heart disease of native coronary artery without angina pectoris: Secondary | ICD-10-CM | POA: Insufficient documentation

## 2018-02-10 LAB — I-STAT BETA HCG BLOOD, ED (MC, WL, AP ONLY)

## 2018-02-10 LAB — URINALYSIS, ROUTINE W REFLEX MICROSCOPIC
BILIRUBIN URINE: NEGATIVE
Glucose, UA: NEGATIVE mg/dL
KETONES UR: NEGATIVE mg/dL
Leukocytes, UA: NEGATIVE
NITRITE: NEGATIVE
PH: 5 (ref 5.0–8.0)
Protein, ur: NEGATIVE mg/dL
Specific Gravity, Urine: 1.013 (ref 1.005–1.030)

## 2018-02-10 LAB — CBC WITH DIFFERENTIAL/PLATELET
ABS IMMATURE GRANULOCYTES: 0 10*3/uL (ref 0.0–0.1)
BASOS ABS: 0.1 10*3/uL (ref 0.0–0.1)
Basophils Relative: 1 %
Eosinophils Absolute: 0.2 10*3/uL (ref 0.0–0.7)
Eosinophils Relative: 2 %
HEMATOCRIT: 33.1 % — AB (ref 36.0–46.0)
HEMOGLOBIN: 10.9 g/dL — AB (ref 12.0–15.0)
Immature Granulocytes: 0 %
LYMPHS PCT: 22 %
Lymphs Abs: 1.5 10*3/uL (ref 0.7–4.0)
MCH: 31.9 pg (ref 26.0–34.0)
MCHC: 32.9 g/dL (ref 30.0–36.0)
MCV: 96.8 fL (ref 78.0–100.0)
MONO ABS: 0.6 10*3/uL (ref 0.1–1.0)
MONOS PCT: 8 %
NEUTROS ABS: 4.7 10*3/uL (ref 1.7–7.7)
Neutrophils Relative %: 67 %
Platelets: 342 10*3/uL (ref 150–400)
RBC: 3.42 MIL/uL — ABNORMAL LOW (ref 3.87–5.11)
RDW: 13.5 % (ref 11.5–15.5)
WBC: 7 10*3/uL (ref 4.0–10.5)

## 2018-02-10 LAB — BASIC METABOLIC PANEL
ANION GAP: 13 (ref 5–15)
BUN: 34 mg/dL — ABNORMAL HIGH (ref 6–20)
CALCIUM: 9.9 mg/dL (ref 8.9–10.3)
CO2: 22 mmol/L (ref 22–32)
CREATININE: 1.06 mg/dL — AB (ref 0.44–1.00)
Chloride: 104 mmol/L (ref 98–111)
GFR, EST NON AFRICAN AMERICAN: 57 mL/min — AB (ref >60)
Glucose, Bld: 105 mg/dL — ABNORMAL HIGH (ref 70–99)
Potassium: 3.4 mmol/L — ABNORMAL LOW (ref 3.5–5.1)
SODIUM: 139 mmol/L (ref 135–145)

## 2018-02-10 LAB — I-STAT TROPONIN, ED
TROPONIN I, POC: 0 ng/mL (ref 0.00–0.08)
TROPONIN I, POC: 0 ng/mL (ref 0.00–0.08)

## 2018-02-10 LAB — D-DIMER, QUANTITATIVE: D-Dimer, Quant: 0.45 ug/mL-FEU (ref 0.00–0.50)

## 2018-02-10 MED ORDER — FENTANYL CITRATE (PF) 100 MCG/2ML IJ SOLN
50.0000 ug | Freq: Once | INTRAMUSCULAR | Status: AC
  Administered 2018-02-10: 50 ug via INTRAVENOUS
  Filled 2018-02-10: qty 2

## 2018-02-10 NOTE — ED Triage Notes (Addendum)
Per [facility], pt from work with a c/c of mid scapular back pain that began at approx. 1515 , SOB, nausea without vomiting, and diaphoresis. This is the same type of pain, but worse, that she experienced when she had her heart attack in January of 2019. A stent was placed in her LAD. She had a total of 3 nitros with little relief of pain and 324 of ASA. No chest pain. Pain does not radiate.

## 2018-02-10 NOTE — ED Notes (Signed)
Patient transported to X-ray 

## 2018-02-10 NOTE — ED Provider Notes (Signed)
[facility] EMERGENCY DEPARTMENT Provider Note   CSN: [id number] Arrival date & time: 02/10/18  1732     History   Chief Complaint Chief Complaint  Patient presents with  . Chest Pain    HPI Monique Hunt is a 57 y.o. female with PMH/o CAD, Depression, NSTEMI (2019), HLD who presents for evaluation of mid scapular back pain that began approximately 3 PM this afternoon while at work.  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 She states that pain does not radiate and stays on the back.  She states initially was an 8/10.   Patient states that she took nitro x2 which made the pain go from 8/10 to a 5/10.  Reported associated shortness of breath, nausea, diaphoresis with the pain began.  She denies any vomiting.  Patient states that the pain was not worse with deep inspiration.  She does report that it was worse with exertion.  Patient also took 324 mg of aspirin prior to ED arrival.  Patient also took nitro x2 and then called EMS.  She reports that with 2 nitros, the pain improved to 5/10.  She states she called EMS because she was concerned that symptoms were similar to when she had a previous and STEMI.  Patient reports that prior to symptoms today, she had been at her normal baseline.  Does have an history of an NSTEMI 2019 and had a LAD stent placed.  Patient reports she is currently on Plavix and states she has been compliant with it.  Patient denies any fevers, abdominal pain, numbness/weakness of her arms or legs.  The history is provided by the patient.    Past Medical History:  Diagnosis Date  . Coronary artery disease   . Depression   . History of kidney stones   . Hypertension   . Migraine    "stopped ~ 2010" (12/15/2017)  . NSTEMI (non-ST elevated myocardial infarction) ([place]) 12/15/2017    Patient Active Problem List   Diagnosis Date Noted  .  CAD (coronary artery disease), native coronary artery 12/19/2017  . Hyperlipidemia LDL goal <70   . NSTEMI (non-ST elevated myocardial infarction) ([place]) 12/15/2017  . Hypertension 12/01/2017  . Chest pain syndrome 12/01/2017  . Former tobacco use 12/01/2017  . Chest pain 12/01/2017  . Depression 06/04/2012  . Pyelonephritis, acute 09/27/2011  . Nephrolithiasis 09/27/2011  . Hypokalemia 09/27/2011  . Hyponatremia 09/27/2011    Past Surgical History:  Procedure Laterality Date  . CORONARY STENT INTERVENTION N/A 12/19/2017   Procedure: CORONARY STENT INTERVENTION;  Surgeon: Belva Crome, MD;  Location: [facility];  Service: Cardiovascular;  Laterality: N/A;  . CYSTOSCOPY W/ STONE MANIPULATION    . LEFT HEART CATH AND CORONARY ANGIOGRAPHY N/A 12/16/2017   Procedure: LEFT HEART CATH AND CORONARY ANGIOGRAPHY;  Surgeon: Belva Crome, MD;  Location: [facility];  Service: Cardiovascular;  Laterality: N/A;  . LITHOTRIPSY    . SKIN SURGERY Right    "birthmark removed under my arm when I was little"     OB History   None      Home Medications    Prior to Admission medications   Medication Sig Start Date End Date Taking? Authorizing Provider  aspirin 81 MG chewable tablet Chew 1 tablet (81 mg total) by mouth daily. 12/21/17   Consuelo Pandy, PA-C  atorvastatin (LIPITOR) 80 MG tablet Take 1 tablet (80 mg total) by mouth daily at 6 PM. 12/20/17   Consuelo Pandy, PA-C  clopidogrel (PLAVIX) 75 MG tablet Take 1 tablet (75 mg total) by mouth daily with breakfast. 12/21/17   Lyda Jester M, PA-C  esomeprazole (NEXIUM) 40 MG capsule Take 40 mg by mouth daily.    [provider]  hydrochlorothiazide (HYDRODIURIL) 12.5 MG tablet Take 1 tablet (12.5 mg total) by mouth daily. 12/20/17   Lyda Jester M, PA-C  lisinopril (PRINIVIL,ZESTRIL) 40 MG tablet Take 1 tablet (40 mg total) by mouth daily. 12/20/17   Lyda Jester M, PA-C  metoprolol succinate  (TOPROL-XL) 25 MG 24 hr tablet Take 1 tablet (25 mg total) by mouth daily as needed (MAY TAKE EXTRA 1/2 TAB AS NEEDED). MAY TAKE EXTRA 1/2 TAB AS NEEDED 01/02/18   Lendon Colonel, NP  nitroGLYCERIN (NITROSTAT) 0.4 MG SL tablet Place 1 tablet (0.4 mg total) under the tongue every 5 (five) minutes x 3 doses as needed for chest pain. 12/20/17   Lyda Jester M, PA-C  venlafaxine XR (EFFEXOR-XR) 37.5 MG 24 hr capsule Take 37.5 mg by mouth 2 (two) times daily.    [provider]    Family History Family History  Problem Relation Age of Onset  . CAD Father     Social History Social History   Tobacco Use  . Smoking status: Never Smoker  . Smokeless tobacco: Never Used  Substance Use Topics  . Alcohol use: Yes    Comment: 12/15/2017 "couple mixed drinks/month"  . Drug use: Never     Allergies   Patient has no known allergies.   Review of Systems Review of Systems  Constitutional: Positive for diaphoresis. Negative for fever.  Respiratory: Positive for shortness of breath. Negative for cough.   Cardiovascular: Positive for chest pain (Back and scapular).  Gastrointestinal: Positive for nausea. Negative for abdominal pain and vomiting.  Genitourinary: Negative for dysuria and hematuria.  Neurological: Negative for headaches.  All other systems reviewed and are negative.    Physical Exam Updated Vital Signs BP 124/70   Pulse 95   Temp 97.8 F (36.6 C) (Oral)   Resp (!) 22   Ht 5' 5.5" (1.664 m)   Wt 63.5 kg   LMP 02/28/2011   SpO2 100%   BMI 22.94 kg/m   Physical Exam  Constitutional: She is oriented to person, place, and time. She appears well-developed and well-nourished.  HENT:  Head: Normocephalic and atraumatic.  Mouth/Throat: Oropharynx is clear and moist and mucous membranes are normal.  Eyes: Pupils are equal, round, and reactive to light. Conjunctivae, EOM and lids are normal.  Neck: Full passive range of motion without pain.  Cardiovascular:  Normal rate, regular rhythm and normal heart sounds. Exam reveals no gallop and no friction rub.  No murmur heard. Pulses:      Radial pulses are 2+ on the right side, and 2+ on the left side.       Dorsalis pedis pulses are 2+ on the right side, and 1+ on the left side.  Pulmonary/Chest: Effort normal and breath sounds normal.  Lungs clear to auscultation bilaterally.  Symmetric chest rise.  No wheezing, rales, rhonchi. Mild increased work of breathing.   Abdominal: Soft. Normal appearance. There is no tenderness. There is no rigidity and no guarding.  Abdomen is soft, non-distended, non-tender. No rigidity, No guarding. No peritoneal signs.  Musculoskeletal: Normal range of motion.  Neurological: She is  alert and oriented to person, place, and time.  Skin: Skin is warm and dry. Capillary refill takes less than 2 seconds.  Psychiatric: She has a normal mood and affect. Her speech is normal.  Nursing note and vitals reviewed.    ED Treatments / Results  Labs (all labs ordered are listed, but only abnormal results are displayed) Labs Reviewed  CBC WITH DIFFERENTIAL/PLATELET - Abnormal; Notable for the following components:      Result Value   RBC 3.42 (*)    Hemoglobin 10.9 (*)    HCT 33.1 (*)    All other components within normal limits  URINALYSIS, ROUTINE W REFLEX MICROSCOPIC - Abnormal; Notable for the following components:   Hgb urine dipstick SMALL (*)    Bacteria, UA RARE (*)    All other components within normal limits  BASIC METABOLIC PANEL - Abnormal; Notable for the following components:   Potassium 3.4 (*)    Glucose, Bld 105 (*)    BUN 34 (*)    Creatinine, Ser 1.06 (*)    GFR calc non Af Amer 57 (*)    All other components within normal limits  D-DIMER, QUANTITATIVE (NOT AT [facility])  CBC  I-STAT TROPONIN, ED  I-STAT TROPONIN, ED  I-STAT BETA HCG BLOOD, ED ([facility], [facility], [facility] ONLY)  I-STAT TROPONIN, ED    EKG EKG Interpretation  Date/Time:  Friday February 10 2018  21:17:49 EDT Ventricular Rate:  78 PR Interval:    QRS Duration: 85 QT Interval:  376 QTC Calculation: 429 R Axis:   -15 Text Interpretation:  Sinus rhythm Abnormal R-wave progression, early transition Left ventricular hypertrophy Nonspecific T abnormalities, inferior leads no significant change from prior Confirmed by Aletta Edouard [phone]) on 02/10/2018 9:22:56 PM   Radiology Dg Chest 2 View  Result Date: 02/10/2018 CLINICAL DATA:  Pt has centralized upper to mid back pain and SOB that began around 3:30 pm. Pt states this is the same feeling she had when she had her heart attack. Pt has a hx of stent placement in July 2019 and high BP. EXAM: CHEST - 2 VIEW COMPARISON:  12/15/2017 FINDINGS: Numerous leads and wires project over the chest. Midline trachea. Convex right thoracic spine curvature is moderate and distorts the appearance of the mediastinum on the frontal radiograph. Normal heart size. No pleural effusion or pneumothorax. No congestive failure. Clear lungs. IMPRESSION: No acute cardiopulmonary disease. Electronically Signed   By: Abigail Miyamoto M.D.   On: 02/10/2018 19:48    Procedures Procedures (including critical care time)  Medications Ordered in ED Medications  fentaNYL (SUBLIMAZE) injection 50 mcg (50 mcg Intravenous Given 02/10/18 1912)     Initial Impression / Assessment and Plan / ED Course  I have reviewed the triage vital signs and the nursing notes.  Pertinent labs & imaging results that were available during my care of the patient were reviewed by me and considered in my medical decision making (see chart for details).  Clinical Course as of Feb 11 53  Fri Sep 13, 973  [id number] 57 year old female with prior and NSTEMI here with atypical chest pain and 2- tropes.  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  She understands to return if any worsening symptoms.   [MB]    Clinical Course User Index [MB]  Hayden Rasmussen, MD    57 y.o. CAD, HTN, NSTEMI who is for evaluation of mid scapular back pain that began  at 3 PM this afternoon while at work.  Associate with nausea, diaphoresis, shortness of breath.  She has a history of an STEMI in July 2018 which she states today's symptoms feel similar.  She is currently on Plavix which she states she has been taking.   Patient is afebrile, non-toxic appearing, sitting comfortably on examination table. Vital signs reviewed and stable.  Concern for ACS etiology versus infectious etiology versus unstable angina versus musculoskeletal pain versus anxiety. Low suspicion for PE but a consideration.  3/physical exam is not concerning for dissection.  Initial labs and imaging ordered.  Cath from 12/16/2017 reviewed.  75% LAD stenosis.  Normal circumflex.  There is mention of right coronary tortuosity.   Initial troponin negative.  UA negative for any acute infectious etiology.  I-STAT beta negative.  CBC shows no significant leukocytosis.  Hemoglobin is stable at 10.9 and hematocrit is 33.1.  BMP shows potassium 3.4.  BUN is 34 creatinine is 1.06.  X-ray negative for any acute infectious etiology.  EKG reviewed.  She does have some T wave changes nonspecific in the inferior leads.  Review of previous EEGs and this is been similar to previous.  Discussed with Dr. Melina Copa who agrees.  Given patient's risk factors, presentation, she has a HEART score of 4.  Plan to delta trop.  Repeat trop negative.  Gust results with patient.  She states pain is currently a 0/10.  Patient is hemodynamically stable.  Curbside consult done with Dr. Neena Rhymes (Cardiology Fellow).  He feels that this is an atypical presentation of ACS etiology.  Given her heart score 4, he does state that patient is a moderate risk but given reassuring cath done on July 2018, during work-up here in the ED and atypical presentation, feels it is okay to have patient discharged and to follow-up with Dr. Alan Ripper  office next week.  Discussed.  with Dr. Melina Copa after independent evaluation patient is in agreement with plan.  Discussed with patient.  She states pain is completely resolved here in the ED.  Patient is hemodynamically stable.  Patient states she feels comfortable going home with plans to follow-up with Dr. Alan Ripper office. Patient had ample opportunity for questions and discussion. All patient's questions were answered with full understanding. Strict return precautions discussed. Patient expresses understanding and agreement to plan.   Final Clinical Impressions(s) / ED Diagnoses   Final diagnoses:  Chest pain, unspecified type    ED Discharge Orders    None       Desma Mcgregor 02/11/18 2356    Hayden Rasmussen, MD 02/13/18 [phone]

## 2018-02-10 NOTE — Discharge Instructions (Addendum)
You can take Tylenol or Ibuprofen as directed for pain. You can alternate Tylenol and Ibuprofen every 4 hours. If you take Tylenol at 1pm, then you can take Ibuprofen at 5pm. Then you can take Tylenol again at 9pm.   Follow up with Dr. Harrington Challenger next week for evaluation.   Return to the Emergency Department immediately if you experiencing worsening chest pain, difficulty breathing, nausea/vomiting, get very sweaty, headache or any other worsening or concerning symptoms.

## 2018-02-11 ENCOUNTER — Telehealth: Payer: Self-pay | Admitting: Internal Medicine

## 2018-02-11 NOTE — Telephone Encounter (Signed)
Pt seen in ED for CP   Sent home  Please add to my clinic sched Monday 9/16 if possible   Otherwise APP this week

## 2018-02-13 NOTE — Telephone Encounter (Signed)
Left pt a message to call back to see if she can come today to see Dr Harrington Challenger in this office today.

## 2018-02-13 NOTE — Telephone Encounter (Signed)
Left pt a message to call back regarding office  Visit for  today.

## 2018-02-15 ENCOUNTER — Other Ambulatory Visit: Payer: Self-pay

## 2018-02-15 ENCOUNTER — Telehealth: Payer: Self-pay | Admitting: Internal Medicine

## 2018-02-15 DIAGNOSIS — R58 Hemorrhage, not elsewhere classified: Secondary | ICD-10-CM

## 2018-02-15 DIAGNOSIS — I10 Essential (primary) hypertension: Secondary | ICD-10-CM

## 2018-02-15 NOTE — Telephone Encounter (Signed)
Patient wrote in that she is having nose bleeds daily She had PTCA/STENT in mid July   Needs to be on these meds for 1 year  Recom;  1.  CBC  2  Pt needs urgent referral to ENT   [facility]) to evaluate/treat    Call me if problems  Dorris Carnes

## 2018-02-16 ENCOUNTER — Telehealth: Payer: Self-pay | Admitting: Internal Medicine

## 2018-02-16 LAB — BASIC METABOLIC PANEL
BUN / CREAT RATIO: 29 — AB (ref 9–23)
BUN: 25 mg/dL — AB (ref 6–24)
CO2: 25 mmol/L (ref 20–29)
CREATININE: 0.85 mg/dL (ref 0.57–1.00)
Calcium: 10.3 mg/dL — ABNORMAL HIGH (ref 8.7–10.2)
Chloride: 100 mmol/L (ref 96–106)
GFR calc Af Amer: 88 mL/min/{1.73_m2} (ref >59)
GFR calc non Af Amer: 76 mL/min/{1.73_m2} (ref >59)
GLUCOSE: 82 mg/dL (ref 65–99)
Potassium: 4.4 mmol/L (ref 3.5–5.2)
SODIUM: 137 mmol/L (ref 134–144)

## 2018-02-16 LAB — CBC WITH DIFFERENTIAL/PLATELET
Basophils Absolute: 0.1 10*3/uL (ref 0.0–0.2)
Basos: 1 %
EOS (ABSOLUTE): 0.2 10*3/uL (ref 0.0–0.4)
Eos: 3 %
Hematocrit: 35.3 % (ref 34.0–46.6)
Hemoglobin: 12.1 g/dL (ref 11.1–15.9)
Immature Grans (Abs): 0 10*3/uL (ref 0.0–0.1)
Immature Granulocytes: 0 %
LYMPHS ABS: 1.3 10*3/uL (ref 0.7–3.1)
Lymphs: 19 %
MCH: 31.8 pg (ref 26.6–33.0)
MCHC: 34.3 g/dL (ref 31.5–35.7)
MCV: 93 fL (ref 79–97)
MONOCYTES: 7 %
Monocytes Absolute: 0.5 10*3/uL (ref 0.1–0.9)
Neutrophils Absolute: 4.8 10*3/uL (ref 1.4–7.0)
Neutrophils: 70 %
Platelets: 393 10*3/uL (ref 150–450)
RBC: 3.81 x10E6/uL (ref 3.77–5.28)
RDW: 13.7 % (ref 12.3–15.4)
WBC: 7 10*3/uL (ref 3.4–10.8)

## 2018-02-16 NOTE — Telephone Encounter (Signed)
Follow Up: ° ° ° °Patient returning call.  °

## 2018-02-16 NOTE — Telephone Encounter (Signed)
Patient spoke to triage nurse today. See previous phone encounter.

## 2018-02-16 NOTE — Telephone Encounter (Signed)
Left another message for pt to call back and to ask for [facility]

## 2018-02-16 NOTE — Telephone Encounter (Signed)
New Message: ° ° ° °Patient is returning a call °

## 2018-02-16 NOTE — Telephone Encounter (Signed)
Spoke with the pt and she reports that she has not had another nose bleed since yesterday afternoon. I advised her that her CBC looked good but waiting for Dr. Harrington Challenger to review for a final report. Pt declined an appt for an ENT.Marland Kitchen She wants to hold off and see if it happens again. Advised pt to let us know if anything changes. Pt verbalized understanding and agrees.

## 2018-02-16 NOTE — Telephone Encounter (Signed)
Left message for pt to call back  °

## 2018-02-19 NOTE — Progress Notes (Deleted)
Cardiology Office Note   Date:  02/19/2018   ID:  CLESSIE KARRAS, DOB Apr 01, 1961, MRN [id number]  PCP:  Camille Bal, PA-C  Cardiologist: Dr. Harrington Challenger No chief complaint on file.    History of Present Illness: Monique Hunt is a 57 y.o. female who presents for ongoing assessment and management of coronary artery disease.  The patient had cardiac catheterization in July 2019 revealing 75% hazy LAD stenosis, normal left main, normal circumflex with marked tortuosity/angulation of the innominate aortic arch bifurcation, dominant RCA with moderate to mid and distal vessel tortuosity and marked tortuosity of the PDA and LV branches.  The patient had subsequent PCI to the proximal mid LAD and a staged procedure via femoral approach.  The patient was seen on follow-up with multiple somatic complaints to include fatigue shoulder pain and headaches with neck pain.  Medications were adjusted to include metoprolol to be changed to succinate and take at nighttime.  The patient was also found to be having some depression as she had lost her job due to frequent health issues.  The patient called our office on 02/15/2018 with complaints of nosebleeds.  The patient was advised to continue her medication regimen, a CBC was drawn to evaluate for anemia or thrombocytopenia which was found to be negative.  She was advised to use nasal moisturizer and p.o. Pepto-Bismol for symptoms.    Past Medical History:  Diagnosis Date  . Coronary artery disease   . Depression   . History of kidney stones   . Hypertension   . Migraine    "stopped ~ 2010" (12/15/2017)  . NSTEMI (non-ST elevated myocardial infarction) ([place]) 12/15/2017    Past Surgical History:  Procedure Laterality Date  . CORONARY STENT INTERVENTION N/A 12/19/2017   Procedure: CORONARY STENT INTERVENTION;  Surgeon: Belva Crome, MD;  Location: [facility];  Service: Cardiovascular;  Laterality: N/A;  . CYSTOSCOPY W/ STONE MANIPULATION      . LEFT HEART CATH AND CORONARY ANGIOGRAPHY N/A 12/16/2017   Procedure: LEFT HEART CATH AND CORONARY ANGIOGRAPHY;  Surgeon: Belva Crome, MD;  Location: [facility];  Service: Cardiovascular;  Laterality: N/A;  . LITHOTRIPSY    . SKIN SURGERY Right    "birthmark removed under my arm when I was little"     Current Outpatient Medications  Medication Sig Dispense Refill  . aspirin 81 MG chewable tablet Chew 1 tablet (81 mg total) by mouth daily. 30 tablet 11  . atorvastatin (LIPITOR) 80 MG tablet Take 1 tablet (80 mg total) by mouth daily at 6 PM. 30 tablet 6  . clopidogrel (PLAVIX) 75 MG tablet Take 1 tablet (75 mg total) by mouth daily with breakfast. 30 tablet 12  . esomeprazole (NEXIUM) 40 MG capsule Take 40 mg by mouth daily.    . hydrochlorothiazide (HYDRODIURIL) 12.5 MG tablet Take 1 tablet (12.5 mg total) by mouth daily. 30 tablet 5  . lisinopril (PRINIVIL,ZESTRIL) 40 MG tablet Take 1 tablet (40 mg total) by mouth daily. 30 tablet 6  . metoprolol succinate (TOPROL-XL) 25 MG 24 hr tablet Take 1 tablet (25 mg total) by mouth daily as needed (MAY TAKE EXTRA 1/2 TAB AS NEEDED). MAY TAKE EXTRA 1/2 TAB AS NEEDED 30 tablet 6  . nitroGLYCERIN (NITROSTAT) 0.4 MG SL tablet Place 1 tablet (0.4 mg total) under the tongue every 5 (five) minutes x 3 doses as needed for chest pain. 25 tablet 3  . venlafaxine XR (EFFEXOR-XR) 37.5 MG 24  hr capsule Take 37.5 mg by mouth 2 (two) times daily.     No current facility-administered medications for this visit.     Allergies:   Patient has no known allergies.    Social History:  The patient  reports that she has never smoked. She has never used smokeless tobacco. She reports that she drinks alcohol. She reports that she does not use drugs.   Family History:  The patient's family history includes CAD in her father.    ROS: All other systems are reviewed and negative. Unless otherwise mentioned in H&P    PHYSICAL EXAM: VS:  LMP 02/28/2011  ,  BMI There is no height or weight on file to calculate BMI. GEN: Well nourished, well developed, in no acute distress HEENT: normal Neck: no JVD, carotid bruits, or masses Cardiac: ***RRR; no murmurs, rubs, or gallops,no edema  Respiratory:  clear to auscultation bilaterally, normal work of breathing GI: soft, nontender, nondistended, + BS MS: no deformity or atrophy Skin: warm and dry, no rash Neuro:  Strength and sensation are intact Psych: euthymic mood, full affect   EKG:  EKG {ACTION; IS/IS [id number]:[id number]} ordered today. The ekg ordered today demonstrates ***   Recent Labs: 12/15/2017: ALT 20; TSH 1.937 02/15/2018: BUN 25; Creatinine, Ser 0.85; Hemoglobin 12.1; Platelets 393; Potassium 4.4; Sodium 137    Lipid Panel    Component Value Date/Time   CHOL 323 (H) 12/16/2017 0714   TRIG 394 (H) 12/16/2017 0714   HDL 45 12/16/2017 0714   CHOLHDL 7.2 12/16/2017 0714   VLDL 79 (H) 12/16/2017 0714   LDLCALC 199 (H) 12/16/2017 0714      Wt Readings from Last 3 Encounters:  02/10/18 140 lb (63.5 kg)  01/05/18 143 lb (64.9 kg)  01/02/18 143 lb 9.6 oz (65.1 kg)      Other studies Reviewed: Additional studies/ records that were reviewed today include: ***. Review of the above records demonstrates: ***   ASSESSMENT AND PLAN:  1.  ***   Current medicines are reviewed at length with the patient today.    Labs/ tests ordered today include: *** Phill Myron. [place] Pugh, ANP, AACC   02/19/2018 12:46 PM    [facility] [address], [place], [place] [address] Phone: [phone]; Fax: [phone]

## 2018-02-20 ENCOUNTER — Other Ambulatory Visit: Payer: Self-pay

## 2018-02-20 DIAGNOSIS — R040 Epistaxis: Secondary | ICD-10-CM

## 2018-02-20 DIAGNOSIS — Z79899 Other long term (current) drug therapy: Secondary | ICD-10-CM

## 2018-02-20 MED FILL — METOPROLOL SUCCINATE ER 25: 25 | 30 days supply | Qty: 30 | Fill #2

## 2018-02-20 MED FILL — HYDROCHLOROTHIAZIDE 12.5 MG: 12.5 | 30 days supply | Qty: 30 | Fill #2

## 2018-02-20 NOTE — Progress Notes (Signed)
Lendon Colonel, NP  Waylan Rocher, LPN        Ms.Fraizer emailed me about having frequent nose bleeds and headache's. She is on Plavix. I would like her to have a CBC and BMET drawn please. I sent her an email back letting her know to come by and have labs drawn. If she has worsening symptoms should be seen by PCP or ER.

## 2018-02-21 ENCOUNTER — Ambulatory Visit: Payer: Self-pay | Admitting: Adult Health

## 2018-02-23 MED FILL — ATORVASTATIN 80 MG TABLET: 80 | 30 days supply | Qty: 30 | Fill #2

## 2018-02-23 MED FILL — LISINOPRIL 40 MG TABLET: 40 | 30 days supply | Qty: 30 | Fill #1

## 2018-03-10 ENCOUNTER — Other Ambulatory Visit: Payer: Self-pay | Admitting: Cardiothoracic Surgery

## 2018-03-10 DIAGNOSIS — R911 Solitary pulmonary nodule: Secondary | ICD-10-CM

## 2018-03-16 ENCOUNTER — Other Ambulatory Visit: Payer: Self-pay

## 2018-03-20 MED FILL — ATORVASTATIN 80 MG TABLET: 80 | 30 days supply | Qty: 30 | Fill #3

## 2018-03-20 MED FILL — METOPROLOL SUCCINATE ER 25: 25 | 30 days supply | Qty: 30 | Fill #3

## 2018-03-20 MED FILL — LISINOPRIL 40 MG TABLET: 40 | 30 days supply | Qty: 30 | Fill #2

## 2018-03-20 MED FILL — HYDROCHLOROTHIAZIDE 12.5 MG: 12.5 | 30 days supply | Qty: 30 | Fill #3

## 2018-04-06 ENCOUNTER — Other Ambulatory Visit: Payer: Self-pay

## 2018-04-06 ENCOUNTER — Ambulatory Visit: Payer: Self-pay | Admitting: Cardiothoracic Surgery

## 2018-04-07 ENCOUNTER — Encounter: Payer: Self-pay | Admitting: Internal Medicine

## 2018-04-07 ENCOUNTER — Ambulatory Visit: Payer: Self-pay | Attending: Internal Medicine | Admitting: Internal Medicine

## 2018-04-07 VITALS — BP 145/85 | HR 79 | Temp 97.9°F | Resp 16 | Ht 65.0 in | Wt 153.0 lb

## 2018-04-07 DIAGNOSIS — Z87891 Personal history of nicotine dependence: Secondary | ICD-10-CM | POA: Insufficient documentation

## 2018-04-07 DIAGNOSIS — F329 Major depressive disorder, single episode, unspecified: Secondary | ICD-10-CM | POA: Insufficient documentation

## 2018-04-07 DIAGNOSIS — R002 Palpitations: Secondary | ICD-10-CM | POA: Insufficient documentation

## 2018-04-07 DIAGNOSIS — R911 Solitary pulmonary nodule: Secondary | ICD-10-CM | POA: Insufficient documentation

## 2018-04-07 DIAGNOSIS — I10 Essential (primary) hypertension: Secondary | ICD-10-CM | POA: Insufficient documentation

## 2018-04-07 DIAGNOSIS — E785 Hyperlipidemia, unspecified: Secondary | ICD-10-CM | POA: Insufficient documentation

## 2018-04-07 DIAGNOSIS — Z1239 Encounter for other screening for malignant neoplasm of breast: Secondary | ICD-10-CM

## 2018-04-07 DIAGNOSIS — Z79899 Other long term (current) drug therapy: Secondary | ICD-10-CM | POA: Insufficient documentation

## 2018-04-07 DIAGNOSIS — Z8249 Family history of ischemic heart disease and other diseases of the circulatory system: Secondary | ICD-10-CM | POA: Insufficient documentation

## 2018-04-07 DIAGNOSIS — F32A Depression, unspecified: Secondary | ICD-10-CM

## 2018-04-07 DIAGNOSIS — I252 Old myocardial infarction: Secondary | ICD-10-CM | POA: Insufficient documentation

## 2018-04-07 DIAGNOSIS — I25119 Atherosclerotic heart disease of native coronary artery with unspecified angina pectoris: Secondary | ICD-10-CM | POA: Insufficient documentation

## 2018-04-07 DIAGNOSIS — Z7982 Long term (current) use of aspirin: Secondary | ICD-10-CM | POA: Insufficient documentation

## 2018-04-07 DIAGNOSIS — E041 Nontoxic single thyroid nodule: Secondary | ICD-10-CM | POA: Insufficient documentation

## 2018-04-07 DIAGNOSIS — Z955 Presence of coronary angioplasty implant and graft: Secondary | ICD-10-CM | POA: Insufficient documentation

## 2018-04-07 MED ORDER — METOPROLOL SUCCINATE ER 50 MG PO TB24: 50.0000 mg | ORAL_TABLET | Freq: Every day | ORAL | 6 refills | Status: DC

## 2018-04-07 MED ORDER — VENLAFAXINE HCL ER 37.5 MG PO CP24: 37.5000 mg | ORAL_CAPSULE | Freq: Two times a day (BID) | ORAL | 5 refills | Status: DC

## 2018-04-07 NOTE — Progress Notes (Signed)
Patient ID: Monique Hunt, female    DOB: 1961-04-29  MRN: [id number]  CC: New Patient (Initial Visit) and Hypertension   Subjective: Monique Hunt is a 57 y.o. female who presents for new patient visit. Her concerns today include:  Patient with history of HTN, CAD one stent to LAD 11/2017, depression, HL  Prior PCP was PA Monique Hunt at [facility]  Changed due to lack of insurance.  Last seen at [facility] 12/2017.   CAD:  "I've not felt good every since I had that heart attack and stent put in."  Tires easily, no energy.  With simple chores, she feels out of breath.  Palpitations at times.  No CP.  Pain between the shoulder blades intermittently but not every day.  When she had her heart attack, she experienced pain between the shoulder blade not anterior chest pain.  She has inform cardiology of the symptoms when she was last seen by them.  Used SL Nitro 3 x since MI. Did not have cardiac rehab.   -No lower extremity edema.  No PND orthopnea. -She had CT angios of the chest in July to rule out dissection.  Incidental finding revealed a 2 x 5 cm left thyroid nodule and a 9 mm right middle lobe lung nodule.  She saw CT surgeon Dr. Servando Hunt in August for the pulmonary nodule.  Patient considered low risk for lung cancer given that she has never smoked.  He had recommended repeat CT imaging in 3 months.  It was scheduled for last week but she had to cancel due to lack of insurance and the fact that she had just started a new job. -Compliant with meds.  Next appt with cardiology is early next yr -BP at home after taking meds usually 130/80  She is on Effexor for depression.  She reports that she is doing well on the medication.   Family history, surgical history and social history reviewed and updated. Patient Active Problem List   Diagnosis Date Noted  . CAD (coronary artery disease), native coronary artery 12/19/2017  . Hyperlipidemia LDL goal <70   . NSTEMI (non-ST  elevated myocardial infarction) ([place]) 12/15/2017  . Hypertension 12/01/2017  . Chest pain syndrome 12/01/2017  . Former tobacco use 12/01/2017  . Chest pain 12/01/2017  . Depression 06/04/2012  . Pyelonephritis, acute 09/27/2011  . Nephrolithiasis 09/27/2011  . Hypokalemia 09/27/2011  . Hyponatremia 09/27/2011     Current Outpatient Medications on File Prior to Visit  Medication Sig Dispense Refill  . aspirin 81 MG chewable tablet Chew 1 tablet (81 mg total) by mouth daily. 30 tablet 11  . atorvastatin (LIPITOR) 80 MG tablet Take 1 tablet (80 mg total) by mouth daily at 6 PM. 30 tablet 6  . clopidogrel (PLAVIX) 75 MG tablet Take 1 tablet (75 mg total) by mouth daily with breakfast. 30 tablet 12  . esomeprazole (NEXIUM) 40 MG capsule Take 40 mg by mouth daily.    . hydrochlorothiazide (HYDRODIURIL) 12.5 MG tablet Take 1 tablet (12.5 mg total) by mouth daily. 30 tablet 5  . lisinopril (PRINIVIL,ZESTRIL) 40 MG tablet Take 1 tablet (40 mg total) by mouth daily. 30 tablet 6  . nitroGLYCERIN (NITROSTAT) 0.4 MG SL tablet Place 1 tablet (0.4 mg total) under the tongue every 5 (five) minutes x 3 doses as needed for chest pain. 25 tablet 3   No current facility-administered medications on file prior to visit.     No Known Allergies  Social  History   Socioeconomic History  . Marital status: Divorced    Spouse name: Not on file  . Number of children: 2  . Years of education: Not on file  . Highest education level: Not on file  Occupational History  . Not on file  Social Needs  . Financial resource strain: Not on file  . Food insecurity:    Worry: Not on file    Inability: Not on file  . Transportation needs:    Medical: Not on file    Non-medical: Not on file  Tobacco Use  . Smoking status: Never Smoker  . Smokeless tobacco: Never Used  Substance and Sexual Activity  . Alcohol use: Yes    Comment: 12/15/2017 "couple mixed drinks/month"  . Drug use: Never  . Sexual activity: Yes     Comment: menopausal  Lifestyle  . Physical activity:    Days per week: Not on file    Minutes per session: Not on file  . Stress: Not on file  Relationships  . Social connections:    Talks on phone: Not on file    Gets together: Not on file    Attends religious service: Not on file    Active member of club or organization: Not on file    Attends meetings of clubs or organizations: Not on file    Relationship status: Not on file  . Intimate partner violence:    Fear of current or ex partner: Not on file    Emotionally abused: Not on file    Physically abused: Not on file    Forced sexual activity: Not on file  Other Topics Concern  . Not on file  Social History Narrative  . Not on file    Family History  Problem Relation Age of Onset  . CAD Father   . Heart disease Father   . Breast cancer Mother   . Hyperlipidemia Sister   . Hyperlipidemia Brother     Past Surgical History:  Procedure Laterality Date  . CORONARY STENT INTERVENTION N/A 12/19/2017   Procedure: CORONARY STENT INTERVENTION;  Surgeon: Monique Crome, MD;  Location: [place] [facility];  Service: Cardiovascular;  Laterality: N/A;  . CYSTOSCOPY W/ STONE MANIPULATION    . FACIAL FRACTURE SURGERY  1979  . LEFT HEART CATH AND CORONARY ANGIOGRAPHY N/A 12/16/2017   Procedure: LEFT HEART CATH AND CORONARY ANGIOGRAPHY;  Surgeon: Monique Crome, MD;  Location: [place] [facility];  Service: Cardiovascular;  Laterality: N/A;  . LITHOTRIPSY    . SKIN SURGERY Right    "birthmark removed under my arm when I was little"    ROS: Review of Systems Negative except as stated above. PHYSICAL EXAM: BP (!) 145/85   Pulse 79   Temp 97.9 F (36.6 C) (Oral)   Resp 16   Ht 5\' 5"  (1.651 m)   Wt 153 lb (69.4 kg)   LMP 02/28/2011   SpO2 97%   BMI 25.46 kg/m   Wt Readings from Last 3 Encounters:  04/07/18 153 lb (69.4 kg)  02/10/18 140 lb (63.5 kg)  01/05/18 143 lb (64.9 kg)   Physical Exam  General appearance -  alert, well appearing, middle-aged Caucasian female and in no distress Mental status - normal mood, behavior, speech, dress, motor activity, and thought processes Eyes - pupils equal and reactive, extraocular eye movements intact Mouth - mucous membranes moist, pharynx normal without lesions Neck -no cervical lymphadenopathy.  No thyromegaly.  No thyroid nodules appreciated.  Lymphatics -no axillary lymphadenopathy Chest - clear to auscultation, no wheezes, rales or rhonchi, symmetric air entry Heart - normal rate, regular rhythm, normal S1, S2, no murmurs, rubs, clicks or gallops Extremities - peripheral pulses normal, no pedal edema, no clubbing or cyanosis    Chemistry      Component Value Date/Time   NA 137 02/15/2018 1517   K 4.4 02/15/2018 1517   CL 100 02/15/2018 1517   CO2 25 02/15/2018 1517   BUN 25 (H) 02/15/2018 1517   CREATININE 0.85 02/15/2018 1517      Component Value Date/Time   CALCIUM 10.3 (H) 02/15/2018 1517   ALKPHOS 59 12/15/2017 2206   AST 19 12/15/2017 2206   ALT 20 12/15/2017 2206   BILITOT 0.5 12/15/2017 2206     Lab Results  Component Value Date   WBC 7.0 02/15/2018   HGB 12.1 02/15/2018   HCT 35.3 02/15/2018   MCV 93 02/15/2018   PLT 393 02/15/2018    ASSESSMENT AND PLAN: 1. Coronary artery disease involving native coronary artery of native heart with angina pectoris ([place]) Clinically stable though I wonder whether the pain that she has between the scapula is equivalent to angina.  I hate to increase the metoprolol given her complaints of fatigue but I think it needs to be maximized a little bit more in the event that this pain is angina equivalent.  Also blood pressure is not at goal and increased dose will help to get her there. Patient to apply for the orange card and cone discount card - metoprolol succinate (TOPROL-XL) 50 MG 24 hr tablet; Take 1 tablet (50 mg total) by mouth daily. MAY TAKE EXTRA 1/2 TAB AS NEEDED  Dispense: 30 tablet; Refill:  6  2. Essential hypertension See #1 above.  Advised to limit salt in the foods. - metoprolol succinate (TOPROL-XL) 50 MG 24 hr tablet; Take 1 tablet (50 mg total) by mouth daily. MAY TAKE EXTRA 1/2 TAB AS NEEDED  Dispense: 30 tablet; Refill: 6  3. Hyperlipidemia, unspecified hyperlipidemia type Continue atorvastatin.  4. Thyroid nodule We need to do thyroid ultrasound.  However patient would like to hold off until she is approved for the orange card and cone discount. - TSH+T4F+T3Free  5. Lung nodule, solitary Will reorder CT of the chest once she has the orange card/cone discount  6. Depression, unspecified depression type Well-controlled on Effexor - venlafaxine XR (EFFEXOR-XR) 37.5 MG 24 hr capsule; Take 1 capsule (37.5 mg total) by mouth 2 (two) times daily.  Dispense: 60 capsule; Refill: 5  7. Breast cancer screening - MM Digital Screening; Future     Patient was given the opportunity to ask questions.  Patient verbalized understanding of the plan and was able to repeat key elements of the plan.   Orders Placed This Encounter  Procedures  . MM Digital Screening  . [id number]     Requested Prescriptions   Signed Prescriptions Disp Refills  . venlafaxine XR (EFFEXOR-XR) 37.5 MG 24 hr capsule 60 capsule 5    Sig: Take 1 capsule (37.5 mg total) by mouth 2 (two) times daily.  . metoprolol succinate (TOPROL-XL) 50 MG 24 hr tablet 30 tablet 6    Sig: Take 1 tablet (50 mg total) by mouth daily. MAY TAKE EXTRA 1/2 TAB AS NEEDED    Return in about 2 months (around 06/07/2018).  Karle Plumber, MD, FACP

## 2018-04-07 NOTE — Patient Instructions (Addendum)
Increase metoprolol to 50 mg daily.  Continue to monitor blood pressure.  Goal is 130/80 or lower. Please apply for the orange card and cone discount card.  On your next visit we will order the repeat CAT scan of your chest and also to the thyroid ultrasound to evaluate thyroid nodule.  Please call the BCCCP (breast and cervical cancer control program) at [phone] to schedule diagnostic mammogram

## 2018-04-08 LAB — TSH+T4F+T3FREE
FREE T4: 0.88 ng/dL (ref 0.82–1.77)
T3, Free: 4.1 pg/mL (ref 2.0–4.4)
TSH: 0.345 u[IU]/mL — AB (ref 0.450–4.500)

## 2018-04-10 ENCOUNTER — Ambulatory Visit: Payer: Self-pay | Admitting: Internal Medicine

## 2018-04-19 MED FILL — METOPROLOL SUCCINATE ER 50: 50 | 20 days supply | Qty: 30 | Fill #0

## 2018-04-20 MED FILL — HYDROCHLOROTHIAZIDE 12.5 MG: 12.5 | 30 days supply | Qty: 30 | Fill #4

## 2018-04-20 MED FILL — LISINOPRIL 40 MG TABLET: 40 | 30 days supply | Qty: 30 | Fill #3

## 2018-04-20 MED FILL — METOPROLOL SUCCINATE ER 25: 25 | 30 days supply | Qty: 30 | Fill #4

## 2018-04-20 MED FILL — ATORVASTATIN 80 MG TABLET: 80 | 30 days supply | Qty: 30 | Fill #4

## 2018-04-25 ENCOUNTER — Ambulatory Visit: Payer: Self-pay | Attending: Internal Medicine

## 2018-05-10 ENCOUNTER — Ambulatory Visit: Payer: Self-pay | Attending: Family Medicine | Admitting: Physician Assistant

## 2018-05-10 VITALS — BP 135/83 | HR 66 | Temp 98.0°F | Resp 16 | Wt 148.0 lb

## 2018-05-10 DIAGNOSIS — F329 Major depressive disorder, single episode, unspecified: Secondary | ICD-10-CM | POA: Insufficient documentation

## 2018-05-10 DIAGNOSIS — I252 Old myocardial infarction: Secondary | ICD-10-CM | POA: Insufficient documentation

## 2018-05-10 DIAGNOSIS — Z7982 Long term (current) use of aspirin: Secondary | ICD-10-CM | POA: Insufficient documentation

## 2018-05-10 DIAGNOSIS — R109 Unspecified abdominal pain: Secondary | ICD-10-CM | POA: Insufficient documentation

## 2018-05-10 DIAGNOSIS — R197 Diarrhea, unspecified: Secondary | ICD-10-CM | POA: Insufficient documentation

## 2018-05-10 DIAGNOSIS — Z79899 Other long term (current) drug therapy: Secondary | ICD-10-CM | POA: Insufficient documentation

## 2018-05-10 DIAGNOSIS — I2510 Atherosclerotic heart disease of native coronary artery without angina pectoris: Secondary | ICD-10-CM | POA: Insufficient documentation

## 2018-05-10 DIAGNOSIS — I10 Essential (primary) hypertension: Secondary | ICD-10-CM | POA: Insufficient documentation

## 2018-05-10 DIAGNOSIS — R195 Other fecal abnormalities: Secondary | ICD-10-CM

## 2018-05-10 MED ORDER — DIPHENOXYLATE-ATROPINE 2.5-0.025 MG PO TABS: ORAL_TABLET | ORAL | 1 refills | Status: DC

## 2018-05-10 NOTE — Progress Notes (Signed)
Patient ID: Monique Hunt, female   DOB: 1960/11/14, 57 y.o.   MRN: [id number]      Monique Hunt, is a 57 y.o. female  [id number]:[id number]  [id number]  DOB - April 21, 1961  Subjective:  Chief Complaint and HPI: Monique Hunt is a 57 y.o. female here today Loose stools and cramping about 30 mins after meals.  Esp if she has meat or caffeine.  Stools are light brown.  Some blood on TP.  Pain is sharp and in lower abdomen.  usu lessens after BM.  No colonoscopy/GI in about 15 years.  Appetite is fair.  Occasional nausea.  No vomiting.  She can eat rice or very bland foods w/o problem but all other foods exacerbate her s/sx.  No f/c.  No weight loss.  No recent travel.  No unusual foods.  She has had this for more than 2 years intermittently but for the last few months it has increased to daily and much worse.  Pain is moderate to severe when it occurs.    FH:  No colon CA,  +heart dz   ROS:   Constitutional:  No f/c, No night sweats, No unexplained weight loss. EENT:  No vision changes, No blurry vision, No hearing changes. No mouth, throat, or ear problems.  Respiratory: No cough, No SOB Cardiac: No CP, no palpitations GI:  + abd pain, No N/V+D/loose stools. GU: No Urinary s/sx Musculoskeletal: No joint pain Neuro: No headache, no dizziness, no motor weakness.  Skin: No rash Endocrine:  No polydipsia. No polyuria.  Psych: Denies SI/HI  No problems updated.  ALLERGIES: No Known Allergies  PAST MEDICAL HISTORY: Past Medical History:  Diagnosis Date  . Coronary artery disease   . Depression   . History of kidney stones   . Hypertension   . Migraine    "stopped ~ 2010" (12/15/2017)  . NSTEMI (non-ST elevated myocardial infarction) ([place]) 12/15/2017    MEDICATIONS AT HOME: Prior to Admission medications   Medication Sig Start Date End Date Taking? Authorizing Provider  aspirin 81 MG chewable tablet Chew 1 tablet (81 mg total) by mouth daily. 12/21/17   Lyda Jester  M, PA-C  atorvastatin (LIPITOR) 80 MG tablet Take 1 tablet (80 mg total) by mouth daily at 6 PM. 12/20/17   Consuelo Pandy, PA-C  clopidogrel (PLAVIX) 75 MG tablet Take 1 tablet (75 mg total) by mouth daily with breakfast. 12/21/17   Consuelo Pandy, PA-C  diphenoxylate-atropine (LOMOTIL) 2.5-0.025 MG tablet 1-2 tabs every 6 hours prn abdominal cramping 05/10/18   Freeman Caldron M, PA-C  esomeprazole (NEXIUM) 40 MG capsule Take 40 mg by mouth daily.    [provider]  hydrochlorothiazide (HYDRODIURIL) 12.5 MG tablet Take 1 tablet (12.5 mg total) by mouth daily. 12/20/17   Lyda Jester M, PA-C  lisinopril (PRINIVIL,ZESTRIL) 40 MG tablet Take 1 tablet (40 mg total) by mouth daily. 12/20/17   Lyda Jester M, PA-C  metoprolol succinate (TOPROL-XL) 50 MG 24 hr tablet Take 1 tablet (50 mg total) by mouth daily. MAY TAKE EXTRA 1/2 TAB AS NEEDED 04/07/18   Ladell Pier, MD  nitroGLYCERIN (NITROSTAT) 0.4 MG SL tablet Place 1 tablet (0.4 mg total) under the tongue every 5 (five) minutes x 3 doses as needed for chest pain. 12/20/17   Lyda Jester M, PA-C  venlafaxine XR (EFFEXOR-XR) 37.5 MG 24 hr capsule Take 1 capsule (37.5 mg total) by mouth 2 (two) times daily. 04/07/18   Ladell Pier, MD  Objective:  EXAM:   Vitals:   05/10/18 0950  BP: 135/83  Pulse: 66  Resp: 16  Temp: 98 F (36.7 C)  TempSrc: Oral  SpO2: 97%  Weight: 148 lb (67.1 kg)    General appearance : A&OX3. NAD. Non-toxic-appearing HEENT: Atraumatic and Normocephalic.  PERRLA. EOM intact.   Mouth-MMM, post pharynx WNL w/o erythema, No PND. Neck: supple, no JVD. No cervical lymphadenopathy. No thyromegaly Chest/Lungs:  Breathing-non-labored, Good air entry bilaterally, breath sounds normal without rales, rhonchi, or wheezing  CVS: S1 S2 regular, no murmurs, gallops, rubs  Abdomen: Bowel sounds present, Non tender and not distended with no gaurding, rigidity or rebound. Extremities:  Bilateral Lower Ext shows no edema, both legs are warm to touch with = pulse throughout Neurology:  CN II-XII grossly intact, Non focal.   Psych:  TP linear. J/I WNL. Normal speech. Appropriate eye contact and affect.  Skin:  No Rash  Data Review Lab Results  Component Value Date   HGBA1C 6.0 (H) 12/15/2017     Assessment & Plan   1. Abdominal cramping Non-acute abdomen - diphenoxylate-atropine (LOMOTIL) 2.5-0.025 MG tablet; 1-2 tabs every 6 hours prn abdominal cramping  Dispense: 40 tablet; Refill: 1 - H. pylori breath test - Comprehensive metabolic panel - CBC with Differential/Platelet - Ambulatory referral to Gastroenterology  2. Loose stools - diphenoxylate-atropine (LOMOTIL) 2.5-0.025 MG tablet; 1-2 tabs every 6 hours prn abdominal cramping  Dispense: 40 tablet; Refill: 1 - H. pylori breath test - Comprehensive metabolic panel - CBC with Differential/Platelet - Ambulatory referral to Gastroenterology     Patient have been counseled extensively about nutrition and exercise  Return for for appt with Dr Wynetta Emery in January.  The patient was given clear instructions to go to ER or return to medical center if symptoms don't improve, worsen or new problems develop. The patient verbalized understanding. The patient was told to call to get lab results if they haven't heard anything in the next week.     Freeman Caldron, PA-C [facility] [place], [place]   05/10/2018, 10:07 AM

## 2018-05-11 LAB — COMPREHENSIVE METABOLIC PANEL
ALT: 39 IU/L — AB (ref 0–32)
AST: 28 IU/L (ref 0–40)
Albumin/Globulin Ratio: 2.1 (ref 1.2–2.2)
Albumin: 4.6 g/dL (ref 3.5–5.5)
Alkaline Phosphatase: 87 IU/L (ref 39–117)
BILIRUBIN TOTAL: 0.2 mg/dL (ref 0.0–1.2)
BUN/Creatinine Ratio: 27 — ABNORMAL HIGH (ref 9–23)
BUN: 20 mg/dL (ref 6–24)
CALCIUM: 10.2 mg/dL (ref 8.7–10.2)
CHLORIDE: 104 mmol/L (ref 96–106)
CO2: 21 mmol/L (ref 20–29)
Creatinine, Ser: 0.75 mg/dL (ref 0.57–1.00)
GFR calc Af Amer: 102 mL/min/{1.73_m2} (ref >59)
GFR, EST NON AFRICAN AMERICAN: 88 mL/min/{1.73_m2} (ref >59)
GLUCOSE: 103 mg/dL — AB (ref 65–99)
Globulin, Total: 2.2 g/dL (ref 1.5–4.5)
Potassium: 4.3 mmol/L (ref 3.5–5.2)
Sodium: 142 mmol/L (ref 134–144)
Total Protein: 6.8 g/dL (ref 6.0–8.5)

## 2018-05-11 LAB — CBC WITH DIFFERENTIAL/PLATELET
BASOS: 1 %
Basophils Absolute: 0.1 10*3/uL (ref 0.0–0.2)
EOS (ABSOLUTE): 0.1 10*3/uL (ref 0.0–0.4)
Eos: 2 %
Hematocrit: 36.3 % (ref 34.0–46.6)
Hemoglobin: 12.1 g/dL (ref 11.1–15.9)
IMMATURE GRANULOCYTES: 0 %
Immature Grans (Abs): 0 10*3/uL (ref 0.0–0.1)
Lymphocytes Absolute: 1.3 10*3/uL (ref 0.7–3.1)
Lymphs: 26 %
MCH: 31.3 pg (ref 26.6–33.0)
MCHC: 33.3 g/dL (ref 31.5–35.7)
MCV: 94 fL (ref 79–97)
MONOS ABS: 0.4 10*3/uL (ref 0.1–0.9)
Monocytes: 7 %
NEUTROS PCT: 64 %
Neutrophils Absolute: 3.1 10*3/uL (ref 1.4–7.0)
Platelets: 391 10*3/uL (ref 150–450)
RBC: 3.87 x10E6/uL (ref 3.77–5.28)
RDW: 11.9 % — ABNORMAL LOW (ref 12.3–15.4)
WBC: 4.8 10*3/uL (ref 3.4–10.8)

## 2018-05-12 ENCOUNTER — Telehealth: Payer: Self-pay

## 2018-05-12 LAB — H. PYLORI BREATH TEST: H pylori Breath Test: NEGATIVE

## 2018-05-12 NOTE — Telephone Encounter (Signed)
Contacted pt to go over lab results pt is aware and doesn't have any questions or concerns 

## 2018-05-25 MED FILL — ATORVASTATIN 80 MG TABLET: 80 | 30 days supply | Qty: 30 | Fill #5

## 2018-05-25 MED FILL — METOPROLOL SUCCINATE ER 50: 50 | 20 days supply | Qty: 30 | Fill #1

## 2018-05-25 MED FILL — HYDROCHLOROTHIAZIDE 12.5 MG: 12.5 | 30 days supply | Qty: 30 | Fill #5

## 2018-05-25 MED FILL — LISINOPRIL 40 MG TABLET: 40 | 30 days supply | Qty: 30 | Fill #4

## 2018-06-05 ENCOUNTER — Encounter: Payer: Self-pay | Admitting: Internal Medicine

## 2018-06-05 ENCOUNTER — Ambulatory Visit: Payer: Self-pay | Attending: Internal Medicine | Admitting: Internal Medicine

## 2018-06-05 VITALS — BP 156/91 | HR 68 | Temp 97.8°F | Resp 16 | Ht 65.5 in | Wt 148.2 lb

## 2018-06-05 DIAGNOSIS — Z7982 Long term (current) use of aspirin: Secondary | ICD-10-CM | POA: Insufficient documentation

## 2018-06-05 DIAGNOSIS — K529 Noninfective gastroenteritis and colitis, unspecified: Secondary | ICD-10-CM | POA: Insufficient documentation

## 2018-06-05 DIAGNOSIS — R911 Solitary pulmonary nodule: Secondary | ICD-10-CM | POA: Insufficient documentation

## 2018-06-05 DIAGNOSIS — F329 Major depressive disorder, single episode, unspecified: Secondary | ICD-10-CM | POA: Insufficient documentation

## 2018-06-05 DIAGNOSIS — Z79899 Other long term (current) drug therapy: Secondary | ICD-10-CM | POA: Insufficient documentation

## 2018-06-05 DIAGNOSIS — E785 Hyperlipidemia, unspecified: Secondary | ICD-10-CM | POA: Insufficient documentation

## 2018-06-05 DIAGNOSIS — Z8249 Family history of ischemic heart disease and other diseases of the circulatory system: Secondary | ICD-10-CM | POA: Insufficient documentation

## 2018-06-05 DIAGNOSIS — F32A Depression, unspecified: Secondary | ICD-10-CM

## 2018-06-05 DIAGNOSIS — I25119 Atherosclerotic heart disease of native coronary artery with unspecified angina pectoris: Secondary | ICD-10-CM | POA: Insufficient documentation

## 2018-06-05 DIAGNOSIS — E041 Nontoxic single thyroid nodule: Secondary | ICD-10-CM | POA: Insufficient documentation

## 2018-06-05 DIAGNOSIS — I10 Essential (primary) hypertension: Secondary | ICD-10-CM | POA: Insufficient documentation

## 2018-06-05 DIAGNOSIS — Z955 Presence of coronary angioplasty implant and graft: Secondary | ICD-10-CM | POA: Insufficient documentation

## 2018-06-05 MED ORDER — HYDROCHLOROTHIAZIDE 12.5 MG PO TABS: 12.5000 mg | ORAL_TABLET | Freq: Every day | ORAL | 5 refills | Status: DC

## 2018-06-05 MED ORDER — ATORVASTATIN CALCIUM 80 MG PO TABS: 80.0000 mg | ORAL_TABLET | Freq: Every day | ORAL | 6 refills | Status: DC

## 2018-06-05 MED ORDER — VENLAFAXINE HCL ER 37.5 MG PO CP24: 37.5000 mg | ORAL_CAPSULE | Freq: Two times a day (BID) | ORAL | 5 refills | Status: DC

## 2018-06-05 MED ORDER — AMLODIPINE BESYLATE 5 MG PO TABS: 5.0000 mg | ORAL_TABLET | Freq: Every day | ORAL | 3 refills | Status: DC

## 2018-06-05 MED FILL — VENLAFAXINE HCL ER 37.5 MG: 37.5 | 30 days supply | Qty: 60 | Fill #0

## 2018-06-05 MED FILL — AMLODIPINE BESYLATE 5 MG TA: 5 | 30 days supply | Qty: 30 | Fill #0

## 2018-06-05 MED FILL — CLOPIDOGREL 75 MG TABLET: 75 | 30 days supply | Qty: 30 | Fill #2

## 2018-06-05 NOTE — Progress Notes (Signed)
Patient ID: Monique Hunt, female    DOB: January 31, 1961  MRN: [id number]  CC: Hypertension   Subjective: Monique Hunt is a 58 y.o. female who presents for chronic disease management. Her concerns today include:  Patient with history of HTN, CAD one stent to LAD 11/2017, depression, HL, left thyroid nodule, right lung nodule  Patient seen by our physician assistant last month with complaints of loose stools and abdominal cramps after meals x2 years that had gotten worse.  She was referred to gastroenterology but has not received appointment as yet.  As a matter of fact she does not feel she needs the referral anymore.  She states that she is doing much better since she is changed her diet.  She stopped drinking diet sodas.  She used to drink 4 to 5 cans a day.  She also stopped eating red meat and eats more fruits and vegetables.  CAD/HTN: was doing good until last wk when she started seeing pain between the shoulder blades intermittently again.  When she feels the pain she checks her blood pressure and found that it has been elevated.  She has log from yesterday- 192/91, 148/77, 170/90.  She does not take sublingual nitroglycerin when she feels the pain between the shoulder blades. -no chest pain.  Little SOB yesterday while doing house chores.  Has f/u with cardiology next mth Reports compliance with medications  Depression:  Doing well on Effexor.  Requests refills.  She has not yet completed all of the paperwork to be considered for the orange card/cone discount.  She is waiting for supporting document from the IRS.  When she is approved we need to refer for ultrasound to evaluate left thyroid nodule and follow-up CAT scan of the chest to evaluate 9 mm right lung nodule  Patient Active Problem List   Diagnosis Date Noted  . Thyroid nodule 04/07/2018  . Lung nodule, solitary 04/07/2018  . CAD (coronary artery disease), native coronary artery 12/19/2017  . Hyperlipidemia LDL goal <70    . NSTEMI (non-ST elevated myocardial infarction) ([place]) 12/15/2017  . Hypertension 12/01/2017  . Depression 06/04/2012  . Pyelonephritis, acute 09/27/2011  . Nephrolithiasis 09/27/2011     Current Outpatient Medications on File Prior to Visit  Medication Sig Dispense Refill  . aspirin 81 MG chewable tablet Chew 1 tablet (81 mg total) by mouth daily. 30 tablet 11  . clopidogrel (PLAVIX) 75 MG tablet Take 1 tablet (75 mg total) by mouth daily with breakfast. 30 tablet 12  . diphenoxylate-atropine (LOMOTIL) 2.5-0.025 MG tablet 1-2 tabs every 6 hours prn abdominal cramping 40 tablet 1  . esomeprazole (NEXIUM) 40 MG capsule Take 40 mg by mouth daily.    Marland Kitchen lisinopril (PRINIVIL,ZESTRIL) 40 MG tablet Take 1 tablet (40 mg total) by mouth daily. 30 tablet 6  . metoprolol succinate (TOPROL-XL) 50 MG 24 hr tablet Take 1 tablet (50 mg total) by mouth daily. MAY TAKE EXTRA 1/2 TAB AS NEEDED 30 tablet 6  . nitroGLYCERIN (NITROSTAT) 0.4 MG SL tablet Place 1 tablet (0.4 mg total) under the tongue every 5 (five) minutes x 3 doses as needed for chest pain. 25 tablet 3   No current facility-administered medications on file prior to visit.     No Known Allergies  Social History   Socioeconomic History  . Marital status: Divorced    Spouse name: Not on file  . Number of children: 2  . Years of education: Not on file  . Highest education  level: Not on file  Occupational History  . Not on file  Social Needs  . Financial resource strain: Not on file  . Food insecurity:    Worry: Not on file    Inability: Not on file  . Transportation needs:    Medical: Not on file    Non-medical: Not on file  Tobacco Use  . Smoking status: Never Smoker  . Smokeless tobacco: Never Used  Substance and Sexual Activity  . Alcohol use: Yes    Comment: 12/15/2017 "couple mixed drinks/month"  . Drug use: Never  . Sexual activity: Yes    Comment: menopausal  Lifestyle  . Physical activity:    Days per week: Not on  file    Minutes per session: Not on file  . Stress: Not on file  Relationships  . Social connections:    Talks on phone: Not on file    Gets together: Not on file    Attends religious service: Not on file    Active member of club or organization: Not on file    Attends meetings of clubs or organizations: Not on file    Relationship status: Not on file  . Intimate partner violence:    Fear of current or ex partner: Not on file    Emotionally abused: Not on file    Physically abused: Not on file    Forced sexual activity: Not on file  Other Topics Concern  . Not on file  Social History Narrative  . Not on file    Family History  Problem Relation Age of Onset  . CAD Father   . Heart disease Father   . Breast cancer Mother   . Hyperlipidemia Sister   . Hyperlipidemia Brother     Past Surgical History:  Procedure Laterality Date  . CORONARY STENT INTERVENTION N/A 12/19/2017   Procedure: CORONARY STENT INTERVENTION;  Surgeon: Belva Crome, MD;  Location: [place] [facility];  Service: Cardiovascular;  Laterality: N/A;  . CYSTOSCOPY W/ STONE MANIPULATION    . FACIAL FRACTURE SURGERY  1979  . LEFT HEART CATH AND CORONARY ANGIOGRAPHY N/A 12/16/2017   Procedure: LEFT HEART CATH AND CORONARY ANGIOGRAPHY;  Surgeon: Belva Crome, MD;  Location: [place] [facility];  Service: Cardiovascular;  Laterality: N/A;  . LITHOTRIPSY    . SKIN SURGERY Right    "birthmark removed under my arm when I was little"    ROS: Review of Systems Negative except as above PHYSICAL EXAM: BP (!) 156/91   Pulse 68   Temp 97.8 F (36.6 C) (Oral)   Resp 16   Ht 5' 5.5" (1.664 m)   Wt 148 lb 3.2 oz (67.2 kg)   LMP 02/28/2011   SpO2 99%   BMI 24.29 kg/m   Wt Readings from Last 3 Encounters:  06/05/18 148 lb 3.2 oz (67.2 kg)  05/10/18 148 lb (67.1 kg)  04/07/18 153 lb (69.4 kg)   RT 170/88, LT 160/96  Physical Exam  General appearance - alert, well appearing, and in no distress Mental status  - normal mood, behavior, speech, dress, motor activity, and thought processes Mouth - mucous membranes moist, pharynx normal without lesions Neck - supple, no significant adenopathy.  No thyroid nodule felt on exam Chest - clear to auscultation, no wheezes, rales or rhonchi, symmetric air entry Extremities - peripheral pulses normal, no pedal edema, no clubbing or cyanosis  Depression screen [facility] 2/9 06/05/2018 05/10/2018 04/07/2018  Decreased Interest 0 0 0  Down, Depressed, Hopeless 0 0 0  PHQ - 2 Score 0 0 0     ASSESSMENT AND PLAN: 1. Essential hypertension Not at goal.  If we increase metoprolol pulse rate would probably be too low, so we decided to add amlodipine instead.  Patient to continue checking blood pressure several times a week with goal being 130/80 or lower.  If she still runs about this she will send me a MyChart message - hydrochlorothiazide (HYDRODIURIL) 12.5 MG tablet; Take 1 tablet (12.5 mg total) by mouth daily.  Dispense: 30 tablet; Refill: 5 - amLODipine (NORVASC) 5 MG tablet; Take 1 tablet (5 mg total) by mouth daily.  Dispense: 90 tablet; Refill: 3  2. Coronary artery disease involving native coronary artery of native heart with angina pectoris ([place]) I recommend that when she feels the pain between the shoulder blade which is likely her angina equivalent, she takes a sublingual nitroglycerin to see if it resolves.  She will continue with line directed medications.  Keep upcoming appointment with cardiology. Add amlodipine to try to get blood pressure better. - atorvastatin (LIPITOR) 80 MG tablet; Take 1 tablet (80 mg total) by mouth daily at 6 PM.  Dispense: 30 tablet; Refill: 6  3. Depression, unspecified depression type Doing well on Effexor - venlafaxine XR (EFFEXOR-XR) 37.5 MG 24 hr capsule; Take 1 capsule (37.5 mg total) by mouth 2 (two) times daily.  Dispense: 60 capsule; Refill: 5  4. Chronic diarrhea Resolved with elimination of diet sodas from her  diet  5. Lung nodule, solitary When she has been approved for the orange card/cone discount, she will let me know via MyChart so that we can order the CAT scan of the chest  6. Thyroid nodule Plan to get thyroid ultrasound when she is approved for the orange card/cone discount   Patient was given the opportunity to ask questions.  Patient verbalized understanding of the plan and was able to repeat key elements of the plan.   No orders of the defined types were placed in this encounter.    Requested Prescriptions   Signed Prescriptions Disp Refills  . atorvastatin (LIPITOR) 80 MG tablet 30 tablet 6    Sig: Take 1 tablet (80 mg total) by mouth daily at 6 PM.  . hydrochlorothiazide (HYDRODIURIL) 12.5 MG tablet 30 tablet 5    Sig: Take 1 tablet (12.5 mg total) by mouth daily.  Marland Kitchen venlafaxine XR (EFFEXOR-XR) 37.5 MG 24 hr capsule 60 capsule 5    Sig: Take 1 capsule (37.5 mg total) by mouth 2 (two) times daily.  Marland Kitchen amLODipine (NORVASC) 5 MG tablet 90 tablet 3    Sig: Take 1 tablet (5 mg total) by mouth daily.    Return in about 3 months (around 09/04/2018).  Karle Plumber, MD, FACP

## 2018-06-05 NOTE — Patient Instructions (Addendum)
Please send me a MyChart message once you are approved for the orange card or cone discount card so that I can order the ultrasound of the thyroid and the CAT scan of the chest.  Your blood pressure is not at goal.  Goal is 130/80 or lower.  We have added another blood pressure medication called Amlodipine 5 mg daily.  Continue to check your blood pressure at least 3-4 times a week.  If blood pressure remains above goal, please let me know so that we can increase the Amlodipine to 10 mg daily.  When you get pain between the shoulder blades, please try using the sublingual nitroglycerin to see if the pain resolves with use.

## 2018-06-12 ENCOUNTER — Ambulatory Visit: Payer: Self-pay

## 2018-06-19 ENCOUNTER — Encounter: Payer: Self-pay | Admitting: Internal Medicine

## 2018-06-19 ENCOUNTER — Ambulatory Visit: Payer: Self-pay

## 2018-06-26 MED FILL — HYDROCHLOROTHIAZIDE 12.5 MG: 12.5 | 30 days supply | Qty: 30 | Fill #0

## 2018-06-26 MED FILL — METOPROLOL SUCCINATE ER 50: 50 | 20 days supply | Qty: 30 | Fill #2

## 2018-06-26 MED FILL — LISINOPRIL 40 MG TABLET: 40 | 30 days supply | Qty: 30 | Fill #5

## 2018-06-26 NOTE — Telephone Encounter (Signed)
Mychart message

## 2018-06-27 ENCOUNTER — Encounter: Payer: Self-pay | Admitting: Internal Medicine

## 2018-06-27 DIAGNOSIS — E041 Nontoxic single thyroid nodule: Secondary | ICD-10-CM

## 2018-06-27 DIAGNOSIS — R911 Solitary pulmonary nodule: Secondary | ICD-10-CM

## 2018-06-27 DIAGNOSIS — R918 Other nonspecific abnormal finding of lung field: Secondary | ICD-10-CM

## 2018-06-28 ENCOUNTER — Encounter: Payer: Self-pay | Admitting: Internal Medicine

## 2018-06-29 ENCOUNTER — Emergency Department (HOSPITAL_COMMUNITY): Payer: Self-pay

## 2018-06-29 ENCOUNTER — Other Ambulatory Visit: Payer: Self-pay

## 2018-06-29 ENCOUNTER — Encounter (HOSPITAL_COMMUNITY): Payer: Self-pay

## 2018-06-29 ENCOUNTER — Emergency Department (HOSPITAL_COMMUNITY)
Admission: EM | Admit: 2018-06-29 | Discharge: 2018-06-29 | Disposition: A | Discharge status: Home / Self Care | Payer: Self-pay | Attending: Emergency Medicine | Admitting: Emergency Medicine

## 2018-06-29 DIAGNOSIS — I10 Essential (primary) hypertension: Secondary | ICD-10-CM | POA: Insufficient documentation

## 2018-06-29 DIAGNOSIS — R0602 Shortness of breath: Secondary | ICD-10-CM | POA: Insufficient documentation

## 2018-06-29 DIAGNOSIS — M549 Dorsalgia, unspecified: Secondary | ICD-10-CM

## 2018-06-29 DIAGNOSIS — R42 Dizziness and giddiness: Secondary | ICD-10-CM | POA: Insufficient documentation

## 2018-06-29 DIAGNOSIS — R0789 Other chest pain: Secondary | ICD-10-CM | POA: Insufficient documentation

## 2018-06-29 DIAGNOSIS — Z7901 Long term (current) use of anticoagulants: Secondary | ICD-10-CM | POA: Insufficient documentation

## 2018-06-29 DIAGNOSIS — Z79899 Other long term (current) drug therapy: Secondary | ICD-10-CM | POA: Insufficient documentation

## 2018-06-29 DIAGNOSIS — M546 Pain in thoracic spine: Secondary | ICD-10-CM | POA: Insufficient documentation

## 2018-06-29 DIAGNOSIS — I252 Old myocardial infarction: Secondary | ICD-10-CM | POA: Insufficient documentation

## 2018-06-29 DIAGNOSIS — R110 Nausea: Secondary | ICD-10-CM | POA: Insufficient documentation

## 2018-06-29 DIAGNOSIS — Z87442 Personal history of urinary calculi: Secondary | ICD-10-CM | POA: Insufficient documentation

## 2018-06-29 LAB — URINALYSIS, ROUTINE W REFLEX MICROSCOPIC
Bilirubin Urine: NEGATIVE
GLUCOSE, UA: NEGATIVE mg/dL
Hgb urine dipstick: NEGATIVE
Ketones, ur: NEGATIVE mg/dL
Nitrite: NEGATIVE
Protein, ur: NEGATIVE mg/dL
Specific Gravity, Urine: 1.016 (ref 1.005–1.030)
pH: 7 (ref 5.0–8.0)

## 2018-06-29 LAB — I-STAT TROPONIN, ED
Troponin i, poc: 0 ng/mL (ref 0.00–0.08)
Troponin i, poc: 0 ng/mL (ref 0.00–0.08)

## 2018-06-29 LAB — CBC
HCT: 37 % (ref 36.0–46.0)
Hemoglobin: 12 g/dL (ref 12.0–15.0)
MCH: 30.6 pg (ref 26.0–34.0)
MCHC: 32.4 g/dL (ref 30.0–36.0)
MCV: 94.4 fL (ref 80.0–100.0)
PLATELETS: 363 10*3/uL (ref 150–400)
RBC: 3.92 MIL/uL (ref 3.87–5.11)
RDW: 12.8 % (ref 11.5–15.5)
WBC: 7 10*3/uL (ref 4.0–10.5)
nRBC: 0 % (ref 0.0–0.2)

## 2018-06-29 LAB — BASIC METABOLIC PANEL
Anion gap: 10 (ref 5–15)
BUN: 22 mg/dL — AB (ref 6–20)
CALCIUM: 10 mg/dL (ref 8.9–10.3)
CHLORIDE: 104 mmol/L (ref 98–111)
CO2: 25 mmol/L (ref 22–32)
CREATININE: 0.97 mg/dL (ref 0.44–1.00)
Glucose, Bld: 86 mg/dL (ref 70–99)
Potassium: 4.2 mmol/L (ref 3.5–5.1)
SODIUM: 139 mmol/L (ref 135–145)

## 2018-06-29 MED ORDER — SODIUM CHLORIDE 0.9 % IV BOLUS
1000.0000 mL | Freq: Once | INTRAVENOUS | Status: AC
  Administered 2018-06-29: 1000 mL via INTRAVENOUS

## 2018-06-29 MED ORDER — MORPHINE SULFATE (PF) 4 MG/ML IV SOLN
4.0000 mg | Freq: Once | INTRAVENOUS | Status: AC
  Administered 2018-06-29: 4 mg via INTRAVENOUS
  Filled 2018-06-29: qty 1

## 2018-06-29 MED ORDER — ASPIRIN 81 MG PO CHEW
324.0000 mg | CHEWABLE_TABLET | Freq: Once | ORAL | Status: AC
  Administered 2018-06-29: 324 mg via ORAL
  Filled 2018-06-29: qty 4

## 2018-06-29 MED ORDER — NITROGLYCERIN 0.4 MG SL SUBL
0.4000 mg | SUBLINGUAL_TABLET | SUBLINGUAL | Status: DC | PRN
  Administered 2018-06-29 (×2): 0.4 mg via SUBLINGUAL
  Filled 2018-06-29 (×2): qty 1

## 2018-06-29 NOTE — Discharge Instructions (Addendum)
Please follow up closely with your doctor for further evaluation of your upper back pain and chest pain.

## 2018-06-29 NOTE — ED Provider Notes (Signed)
[facility] EMERGENCY DEPARTMENT Provider Note   CSN: [id number] Arrival date & time: 06/29/18  1319     History   Chief Complaint Chief Complaint  Patient presents with  . Chest Pain    HPI Monique Hunt is a 58 y.o. female.  The history is provided by the patient and medical records. No language interpreter was used.  Chest Pain     58 year old female with history of cardiac disease, hypertension, depression, kidney stone presenting complaining of back pain.  Patient reports since 2 AM last night she has had persistent pain to her mid upper back.  She described pain as a sharp throbbing sensation with associated nausea, lightheadedness, shortness of breath without diaphoresis.  Pain initially intense but has improved to a 6 out of 10.  Pain felt similar to prior heart attack that she suffered in the past.  Has history of kidney stones but this felt different.  Aside from some dry cough that she also had, no productive cough.  No specific treatment tried.  States she has had several episodes of this weekly usually brought on by exertion and improves with nitro.  Past Medical History:  Diagnosis Date  . Coronary artery disease   . Depression   . History of kidney stones   . Hypertension   . Migraine    "stopped ~ 2010" (12/15/2017)  . NSTEMI (non-ST elevated myocardial infarction) ([place]) 12/15/2017    Patient Active Problem List   Diagnosis Date Noted  . Chronic diarrhea 06/05/2018  . Thyroid nodule 04/07/2018  . Lung nodule, solitary 04/07/2018  . CAD (coronary artery disease), native coronary artery 12/19/2017  . Hyperlipidemia LDL goal <70   . NSTEMI (non-ST elevated myocardial infarction) ([place]) 12/15/2017  . Hypertension 12/01/2017  . Depression 06/04/2012  . Nephrolithiasis 09/27/2011    Past Surgical History:  Procedure Laterality Date  . CORONARY STENT INTERVENTION N/A 12/19/2017   Procedure: CORONARY STENT INTERVENTION;  Surgeon: Belva Crome, MD;  Location: [facility];  Service: Cardiovascular;  Laterality: N/A;  . CYSTOSCOPY W/ STONE MANIPULATION    . FACIAL FRACTURE SURGERY  1979  . LEFT HEART CATH AND CORONARY ANGIOGRAPHY N/A 12/16/2017   Procedure: LEFT HEART CATH AND CORONARY ANGIOGRAPHY;  Surgeon: Belva Crome, MD;  Location: [facility];  Service: Cardiovascular;  Laterality: N/A;  . LITHOTRIPSY    . SKIN SURGERY Right    "birthmark removed under my arm when I was little"     OB History   No obstetric history on file.      Home Medications    Prior to Admission medications   Medication Sig Start Date End Date Taking? Authorizing Provider  amLODipine (NORVASC) 5 MG tablet Take 1 tablet (5 mg total) by mouth daily. 06/05/18   Ladell Pier, MD  aspirin 81 MG chewable tablet Chew 1 tablet (81 mg total) by mouth daily. 12/21/17   Lyda Jester M, PA-C  atorvastatin (LIPITOR) 80 MG tablet Take 1 tablet (80 mg total) by mouth daily at 6 PM. 06/05/18   Ladell Pier, MD  clopidogrel (PLAVIX) 75 MG tablet Take 1 tablet (75 mg total) by mouth daily with breakfast. 12/21/17   Consuelo Pandy, PA-C  diphenoxylate-atropine (LOMOTIL) 2.5-0.025 MG tablet 1-2 tabs every 6 hours prn abdominal cramping 05/10/18   Freeman Caldron M, PA-C  esomeprazole (NEXIUM) 40 MG capsule Take 40 mg by mouth daily.    [provider]  hydrochlorothiazide (  HYDRODIURIL) 12.5 MG tablet Take 1 tablet (12.5 mg total) by mouth daily. 06/05/18   Ladell Pier, MD  lisinopril (PRINIVIL,ZESTRIL) 40 MG tablet Take 1 tablet (40 mg total) by mouth daily. 12/20/17   Lyda Jester M, PA-C  metoprolol succinate (TOPROL-XL) 50 MG 24 hr tablet Take 1 tablet (50 mg total) by mouth daily. MAY TAKE EXTRA 1/2 TAB AS NEEDED 04/07/18   Ladell Pier, MD  nitroGLYCERIN (NITROSTAT) 0.4 MG SL tablet Place 1 tablet (0.4 mg total) under the tongue every 5 (five) minutes x 3 doses as needed for chest pain. 12/20/17    Lyda Jester M, PA-C  venlafaxine XR (EFFEXOR-XR) 37.5 MG 24 hr capsule Take 1 capsule (37.5 mg total) by mouth 2 (two) times daily. 06/05/18   Ladell Pier, MD    Family History Family History  Problem Relation Age of Onset  . CAD Father   . Heart disease Father   . Breast cancer Mother   . Hyperlipidemia Sister   . Hyperlipidemia Brother     Social History Social History   Tobacco Use  . Smoking status: Never Smoker  . Smokeless tobacco: Never Used  Substance Use Topics  . Alcohol use: Yes    Comment: 12/15/2017 "couple mixed drinks/month"  . Drug use: Never     Allergies   Patient has no known allergies.   Review of Systems Review of Systems  Cardiovascular: Positive for chest pain.  All other systems reviewed and are negative.    Physical Exam Updated Vital Signs LMP 02/28/2011   Physical Exam Vitals signs and nursing note reviewed.  Constitutional:      General: She is not in acute distress.    Appearance: She is well-developed.     Comments: Patient appears mildly uncomfortable, tachypneic.  HENT:     Head: Atraumatic.  Eyes:     Conjunctiva/sclera: Conjunctivae normal.  Neck:     Musculoskeletal: Neck supple. No neck rigidity.  Cardiovascular:     Rate and Rhythm: Normal rate and regular rhythm.     Pulses: Normal pulses.     Heart sounds: Normal heart sounds.  Pulmonary:     Breath sounds: No wheezing, rhonchi or rales.     Comments: Tachypnea without wheezes, rales, rhonchi heard Abdominal:     General: Abdomen is flat.     Palpations: Abdomen is soft.     Tenderness: There is no abdominal tenderness.  Musculoskeletal:        General: No swelling.     Comments: No significant midline spine tenderness.  Evidence of scoliosis.  No CVA tenderness.  Skin:    Findings: No rash.  Neurological:     Mental Status: She is alert and oriented to person, place, and time.      ED Treatments / Results  Labs (all labs ordered are  listed, but only abnormal results are displayed) Labs Reviewed  BASIC METABOLIC PANEL - Abnormal; Notable for the following components:      Result Value   BUN 22 (*)    All other components within normal limits  URINALYSIS, ROUTINE W REFLEX MICROSCOPIC - Abnormal; Notable for the following components:   APPearance HAZY (*)    Leukocytes, UA TRACE (*)    Bacteria, UA RARE (*)    All other components within normal limits  CBC  I-STAT TROPONIN, ED  I-STAT TROPONIN, ED    EKG None  Radiology Dg Chest Port 1 View  Result Date: 06/29/2018 CLINICAL DATA:  Back pain. EXAM: PORTABLE CHEST 1 VIEW COMPARISON:  February 10, 2018 FINDINGS: The known 9 mm nodule in the right middle lobe on the CT scan from December 01, 2017 is identified. No other nodules or masses. Persistent cardiomegaly. The hila and mediastinum are normal. No pneumothorax. No focal infiltrate. IMPRESSION: 1. There is a nodule in the right mid to lower lung. This likely correlates with the 9 mm nodule seen in the right middle lobe on a CT scan from December 01, 2017. A three-month follow-up CT was recommended at the time. Recommend clinical correlation. 2. No acute interval change otherwise seen. Electronically Signed   By: Dorise Bullion III M.D   On: 06/29/2018 13:54    Procedures Procedures (including critical care time)  Medications Ordered in ED Medications  nitroGLYCERIN (NITROSTAT) SL tablet 0.4 mg (0.4 mg Sublingual Given 06/29/18 1419)  aspirin chewable tablet 324 mg (324 mg Oral Given 06/29/18 1407)  morphine 4 MG/ML injection 4 mg (4 mg Intravenous Given 06/29/18 1459)  sodium chloride 0.9 % bolus 1,000 mL (0 mLs Intravenous Stopped 06/29/18 1530)     Initial Impression / Assessment and Plan / ED Course  I have reviewed the triage vital signs and the nursing notes.  Pertinent labs & imaging results that were available during my care of the patient were reviewed by me and considered in my medical decision making (see  chart for details).     BP 135/62   Pulse 78   Resp 19   Ht 5\' 5"  (1.651 m)   Wt 65.8 kg   LMP 02/28/2011   SpO2 100%   BMI 24.13 kg/m    Final Clinical Impressions(s) / ED Diagnoses   Final diagnoses:  Atypical chest pain  Upper back pain    ED Discharge Orders    None     1:53 PM Patient complaining of pain to upper mid back which patient felt similar to prior NSTEMI.  Does have history of kidney stones but does not have any CVA tenderness.  Work-up initiated.  2:50 PM Patient received aspirin and sublingual nitro but states it provided no relief.  She still endorse large amount of pain to her back, she is a bit tachypneic.  She has intact distal pulses, low suspicion for dissection.  PE.  Will provide additional symptomatic treatment.  HEART score of 3, low risk of MACE. Will obtain delta trop.  5:11 PM Negative delta trop.  Pt felt much better currently and stable for discharge with close f/u with PCP.  Return preacution given. Doubt kidney stone.    Domenic Moras, PA-C 06/29/18 1713    Valarie Merino, MD 06/30/18 [phone]

## 2018-06-29 NOTE — ED Triage Notes (Signed)
Pt reports back pain beginning this morning around 9:30. Pt has hx of MI.

## 2018-06-29 NOTE — ED Notes (Signed)
Pt alert and oriented in NAD. Pt verbalized understanding of discharge instructions. 

## 2018-06-30 ENCOUNTER — Other Ambulatory Visit: Payer: Self-pay | Admitting: Internal Medicine

## 2018-06-30 DIAGNOSIS — R918 Other nonspecific abnormal finding of lung field: Secondary | ICD-10-CM

## 2018-06-30 DIAGNOSIS — E041 Nontoxic single thyroid nodule: Secondary | ICD-10-CM

## 2018-06-30 MED FILL — ATORVASTATIN 80 MG TABLET: 80 | 30 days supply | Qty: 30 | Fill #6

## 2018-07-03 ENCOUNTER — Encounter: Payer: Self-pay | Admitting: Internal Medicine

## 2018-07-03 ENCOUNTER — Ambulatory Visit (INDEPENDENT_AMBULATORY_CARE_PROVIDER_SITE_OTHER): Payer: Self-pay | Admitting: Internal Medicine

## 2018-07-03 VITALS — BP 126/78 | HR 76 | Ht 65.0 in | Wt 152.6 lb

## 2018-07-03 DIAGNOSIS — I25119 Atherosclerotic heart disease of native coronary artery with unspecified angina pectoris: Secondary | ICD-10-CM

## 2018-07-03 DIAGNOSIS — I10 Essential (primary) hypertension: Secondary | ICD-10-CM

## 2018-07-03 DIAGNOSIS — I2510 Atherosclerotic heart disease of native coronary artery without angina pectoris: Secondary | ICD-10-CM

## 2018-07-03 LAB — LIPID PANEL
CHOLESTEROL TOTAL: 249 mg/dL — AB (ref 100–199)
Chol/HDL Ratio: 5.5 ratio — ABNORMAL HIGH (ref 0.0–4.4)
HDL: 45 mg/dL (ref >39)
Triglycerides: 479 mg/dL — ABNORMAL HIGH (ref 0–149)

## 2018-07-03 MED ORDER — METOPROLOL SUCCINATE ER 25 MG PO TB24: 25.0000 mg | ORAL_TABLET | Freq: Every day | ORAL | 6 refills | Status: DC

## 2018-07-03 MED ORDER — LOSARTAN POTASSIUM 100 MG PO TABS: 100.0000 mg | ORAL_TABLET | Freq: Every day | ORAL | 6 refills | Status: DC

## 2018-07-03 MED FILL — METOPROLOL SUCCINATE ER 25: 25 | 30 days supply | Qty: 30 | Fill #0

## 2018-07-03 MED FILL — LOSARTAN POTASSIUM 100 MG T: 100 | 30 days supply | Qty: 30 | Fill #0

## 2018-07-03 NOTE — Patient Instructions (Addendum)
Medication Instructions:  Your physician has recommended you make the following change in your medication:  1.) decrease metoprolol succinate (Toprol XL) to 25 mg once a day 2.) stop lisinopril 3.) start losartan (Cozaar) 100 mg once a day  If you need a refill on your cardiac medications before your next appointment, please call your pharmacy.   Lab work: Lipids today If you have labs (blood work) drawn today and your tests are completely normal, you will receive your results only by: Marland Kitchen MyChart Message (if you have MyChart) OR . A paper copy in the mail If you have any lab test that is abnormal or we need to change your treatment, we will call you to review the results.  Testing/Procedures: Your physician has requested that you have a stress echocardiogram. For further information please visit [email or web address]. Please follow instruction sheet as given.   Follow-Up: At [facility], you and your health needs are our priority.  As part of our continuing mission to provide you with exceptional heart care, we have created designated Provider Care Teams.  These Care Teams include your primary Cardiologist (physician) and Advanced Practice Providers (APPs -  Physician Assistants and Nurse Practitioners) who all work together to provide you with the care you need, when you need it. You will need a follow up appointment in:  2 months.  Please call our office 2 months in advance to schedule this appointment.  You may see Dorris Carnes, MD or one of the following Advanced Practice Providers on your designated Care Team: Richardson Dopp, PA-C [place], [place] . Daune Perch, NP  Any Other Special Instructions Will Be Listed Below (If Applicable). You have been referred to Cardiac Rehab.

## 2018-07-03 NOTE — Progress Notes (Signed)
Cardiology Office Note   Date:  07/03/2018   ID:  Monique Hunt, DOB Apr 29, 1961, MRN [id number]  PCP:  Ladell Pier, MD  Cardiologist: Dr. Harrington Challenger F/U of CAD    History of Present Illness: Monique Hunt is a 58 y.o. female who presents for f/u of coronary artery disease.  She was admitted to the hospital in July 2010.  Catheterization revealed 75% hazy LAD stenosis, normal left main, normal circumflex with marked tortuosity/angulation of innominate aortic arch bifurcation.  Dominant RCA with moderate mid and distal vessel tortuosity and marked tortuosity of the PDA and LV branches.  Normal LVEF of 55%.  The patient had PCI of the proximal mid LAD and a staged procedure via femoral approach after being loaded with Plavix.  The patient was treated with aspirin high intensity statin beta-blocker and ACE. The patient has been seen several times in clinic by Bunnie Domino since July.  Her medicines have been adjusted.  She says she still gives out with activity, walking to the street she will get short of breath.  She also has intermittent pain between her shoulder blades,  She says this is similar to the angina she had prior to her stent placement.  Overall she says she felt like she should be feeling better at this point.  She also notes a cough, chronic dry.  Ques if due to ACE I  Past Medical History:  Diagnosis Date  . Coronary artery disease   . Depression   . History of kidney stones   . Hypertension   . Migraine    "stopped ~ 2010" (12/15/2017)  . NSTEMI (non-ST elevated myocardial infarction) ([place]) 12/15/2017    Past Surgical History:  Procedure Laterality Date  . CORONARY STENT INTERVENTION N/A 12/19/2017   Procedure: CORONARY STENT INTERVENTION;  Surgeon: Belva Crome, MD;  Location: [facility];  Service: Cardiovascular;  Laterality: N/A;  . CYSTOSCOPY W/ STONE MANIPULATION    . FACIAL FRACTURE SURGERY  1979  . LEFT HEART CATH AND CORONARY ANGIOGRAPHY N/A  12/16/2017   Procedure: LEFT HEART CATH AND CORONARY ANGIOGRAPHY;  Surgeon: Belva Crome, MD;  Location: [facility];  Service: Cardiovascular;  Laterality: N/A;  . LITHOTRIPSY    . SKIN SURGERY Right    "birthmark removed under my arm when I was little"     Current Outpatient Medications  Medication Sig Dispense Refill  . amLODipine (NORVASC) 5 MG tablet Take 1 tablet (5 mg total) by mouth daily. 90 tablet 3  . aspirin 81 MG chewable tablet Chew 1 tablet (81 mg total) by mouth daily. 30 tablet 11  . atorvastatin (LIPITOR) 80 MG tablet Take 1 tablet (80 mg total) by mouth daily at 6 PM. 30 tablet 6  . clopidogrel (PLAVIX) 75 MG tablet Take 1 tablet (75 mg total) by mouth daily with breakfast. 30 tablet 12  . diphenoxylate-atropine (LOMOTIL) 2.5-0.025 MG tablet 1-2 tabs every 6 hours prn abdominal cramping (Patient taking differently: Take 1-2 tablets by mouth every 6 (six) hours as needed. abdominal cramping) 40 tablet 1  . esomeprazole (NEXIUM) 40 MG capsule Take 40 mg by mouth daily.    . hydrochlorothiazide (HYDRODIURIL) 12.5 MG tablet Take 1 tablet (12.5 mg total) by mouth daily. 30 tablet 5  . lisinopril (PRINIVIL,ZESTRIL) 40 MG tablet Take 1 tablet (40 mg total) by mouth daily. 30 tablet 6  . metoprolol succinate (TOPROL-XL) 50 MG 24 hr tablet Take 1 tablet (50 mg total) by  mouth daily. MAY TAKE EXTRA 1/2 TAB AS NEEDED 30 tablet 6  . nitroGLYCERIN (NITROSTAT) 0.4 MG SL tablet Place 1 tablet (0.4 mg total) under the tongue every 5 (five) minutes x 3 doses as needed for chest pain. 25 tablet 3  . venlafaxine XR (EFFEXOR-XR) 37.5 MG 24 hr capsule Take 1 capsule (37.5 mg total) by mouth 2 (two) times daily. 60 capsule 5   No current facility-administered medications for this visit.     Allergies:   Patient has no known allergies.    Social History:  The patient  reports that she has never smoked. She has never used smokeless tobacco. She reports current alcohol use. She reports  that she does not use drugs.   Family History:  The patient's family history includes Breast cancer in her mother; CAD in her father; Heart disease in her father; Hyperlipidemia in her brother and sister.    ROS: All other systems are reviewed and negative. Unless otherwise mentioned in H&P    PHYSICAL EXAM: VS:  BP 126/78   Pulse 76   Ht 5\' 5"  (1.651 m)   Wt 152 lb 9.6 oz (69.2 kg)   LMP 02/28/2011   SpO2 94%   BMI 25.39 kg/m  , BMI Body mass index is 25.39 kg/m. GEN: Well nourished, well developed, in no acute distress  HEENT: normal  Neck: JV pis normal   No carotid bruits, or masses Cardiac: RRR; no murmurs, rubs, or gallops,no edema  Respiratory:  clear to auscultation bilaterally, normal work of breathing GI: soft, nontender, nondistended, + BS MS: no deformity or atrophy  Skin: warm and dry, no rash Neuro:  Strength and sensation are intact Psych: euthymic mood, full affect, tearful    EKG: Not ordered  today.   Recent Labs: 04/07/2018: TSH 0.345 05/10/2018: ALT 39 06/29/2018: BUN 22; Creatinine, Ser 0.97; Hemoglobin 12.0; Platelets 363; Potassium 4.2; Sodium 139    Lipid Panel    Component Value Date/Time   CHOL 323 (H) 12/16/2017 0714   TRIG 394 (H) 12/16/2017 0714   HDL 45 12/16/2017 0714   CHOLHDL 7.2 12/16/2017 0714   VLDL 79 (H) 12/16/2017 0714   LDLCALC 199 (H) 12/16/2017 0714      Wt Readings from Last 3 Encounters:  07/03/18 152 lb 9.6 oz (69.2 kg)  06/29/18 145 lb (65.8 kg)  06/05/18 148 lb 3.2 oz (67.2 kg)      Other studies Reviewed: Cardiac Cath 12/19/2017  A stent was successfully placed.    Successful mid LAD DES reducing a 75% eccentric stenosis proximal to a bifurcation to 0% with TIMI grade III flow.  The stent was a Synergy 3.5 x 12 which was postdilated to 3.75 mm in diameter.  RECOMMENDATIONS:  Discharge in a.m.  Risk factor modification: Blood pressure 130/80 mmHg or less, hemoglobin A1c less than 70.,  Phase 2 cardiac  rehab to incorporate aerobic activity into style.  Recommend uninterrupted dual antiplatelet therapy with Aspirin 81mg  daily and75mg  daily for a minimum of 12 months (ACS - Class I recommendation).  ASSESSMENT AND PLAN:  1. CAD: The patient is status post PTCA stent to the LAD.  She had an excellent result.  She has no significant disease in the other coronary arteries.  She still complains of dyspnea with exertion and also pain between her shoulder blades.  When I reviewed this with her from the standpoint of her stent I am not convinced this is related The vessel should either be opened  or closed at this point; I think it is open.   I would recommend setting her up for stress echocardiogram to assess her exercise  tolerance, evaluate for blood pressure response, evaluate diastolic function.  I would also recommend that she cut back on her metoprolol to half tablet daily.  Her heart rate is okay here in clinic but I wonder if her she is being limited when she tries to do things.  She is amenable to cardiac rehab.  I think she would benefit from this.  We will also gain more objective analysis of her physical response and capabilities.  2  HTN  BP is good but she complains of a dry cough  Will switch to Cozaar 100    3. Palpitations: Denies   4.  Hypercholesterolemia:  Lipids today  Continue lipitor 80   F/u in 2 months    Current medicines are reviewed at length with the patient today.    Labs/ tests ordered today include: None  Phill Myron. West Pugh, ANP, AACC   07/03/2018 8:22 AM    [facility] [address], [place], [place] [address] Phone: [phone]; Fax: [phone]

## 2018-07-05 ENCOUNTER — Telehealth: Payer: Self-pay | Admitting: *Deleted

## 2018-07-05 DIAGNOSIS — E7800 Pure hypercholesterolemia, unspecified: Secondary | ICD-10-CM

## 2018-07-05 MED ORDER — ICOSAPENT ETHYL 1 G PO CAPS: 2.0000 g | ORAL_CAPSULE | Freq: Two times a day (BID) | ORAL | 11 refills | Status: DC

## 2018-07-05 NOTE — Telephone Encounter (Signed)
Notes recorded by Fay Records, MD on 07/03/2018 at 10:25 PM EST Triglycerides are very high Continue lipitor  Add Vascepa 2 g bid   F/U lipids in 8 wks _______________________________________________________________ Monique Hunt with pt.  Adv of results and recommendations by Dr. Harrington Challenger. She will add Vascepa 2 g twice a day. Advised of copay card from [employer]  Pt pays cash for her prescriptions at [place]. Repeat lipids on 09/06/18.  Adv pt to be fasting.  Will continue atorvastatin.

## 2018-07-06 ENCOUNTER — Telehealth (HOSPITAL_COMMUNITY): Payer: Self-pay | Admitting: *Deleted

## 2018-07-06 MED FILL — CLOPIDOGREL 75 MG TABLET: 75 | 30 days supply | Qty: 30 | Fill #3

## 2018-07-06 MED FILL — AMLODIPINE BESYLATE 5 MG TA: 5 | 30 days supply | Qty: 30 | Fill #1

## 2018-07-06 NOTE — Telephone Encounter (Signed)
Left message on voicemail per DPR in reference to upcoming appointment scheduled on 07/10/18 at 2:00 with detailed instructions given per Stress Test Requisition Sheet for the test. LM to arrive 30 minutes early, and that it is imperative to arrive on time for appointment to keep from having the test rescheduled. If you need to cancel or reschedule your appointment, please call the office within 24 hours of your appointment. Failure to do so may result in a cancellation of your appointment, and a $50 no show fee. Phone number given for call back for any questions. Monique Hunt

## 2018-07-07 ENCOUNTER — Ambulatory Visit (HOSPITAL_COMMUNITY)
Admission: RE | Admit: 2018-07-07 | Discharge: 2018-07-07 | Disposition: A | Discharge status: Home / Self Care | Payer: Self-pay | Source: Ambulatory Visit | Attending: Internal Medicine | Admitting: Internal Medicine

## 2018-07-07 DIAGNOSIS — R918 Other nonspecific abnormal finding of lung field: Secondary | ICD-10-CM

## 2018-07-07 DIAGNOSIS — E041 Nontoxic single thyroid nodule: Secondary | ICD-10-CM | POA: Insufficient documentation

## 2018-07-10 ENCOUNTER — Ambulatory Visit (HOSPITAL_COMMUNITY): Payer: Self-pay | Attending: Internal Medicine

## 2018-07-10 ENCOUNTER — Ambulatory Visit (HOSPITAL_COMMUNITY): Payer: Self-pay

## 2018-07-10 DIAGNOSIS — I2510 Atherosclerotic heart disease of native coronary artery without angina pectoris: Secondary | ICD-10-CM | POA: Insufficient documentation

## 2018-07-11 ENCOUNTER — Encounter: Payer: Self-pay | Admitting: Internal Medicine

## 2018-07-11 ENCOUNTER — Telehealth (HOSPITAL_COMMUNITY): Payer: Self-pay

## 2018-07-11 NOTE — Telephone Encounter (Signed)
Pt is in the Financial assistance program and will receive 100% discount until 12/18/2018

## 2018-07-11 NOTE — Telephone Encounter (Signed)
Called patient to see if she was interested in participating in the Cardiac Rehab Program. Patient stated yes. Patient will come in for orientation on 08/03/2018 @ 8:15am and will attend the 8:15am exercise class.  Mailed homework package.

## 2018-07-18 ENCOUNTER — Telehealth (HOSPITAL_COMMUNITY): Payer: Self-pay

## 2018-07-18 ENCOUNTER — Telehealth: Payer: Self-pay | Admitting: Internal Medicine

## 2018-07-18 DIAGNOSIS — E7800 Pure hypercholesterolemia, unspecified: Secondary | ICD-10-CM

## 2018-07-18 NOTE — Telephone Encounter (Signed)
Patient wrote in re  1  Thyroid nodule needs Bx    Review of chart looks area is suspicioius  Would be better if could wait until June for close to 1 year of DAPT But with concern for not knowing what nodule is would hold Plavix for 5 days only   Continue AASA   Resume Plavix as quickly as possible post procedure  Again, less that 1 year post stent placement pt is at increased risk for thrombus but need to weigh risk of thyroid to risk of holding     2  Lipids   Cannot get Vascepa   If no options with coupons would recomm she Try Tricor 160 mg   Continue statin   F/U lipdis in 8 wks

## 2018-07-19 ENCOUNTER — Telehealth: Payer: Self-pay | Admitting: Internal Medicine

## 2018-07-19 DIAGNOSIS — E041 Nontoxic single thyroid nodule: Secondary | ICD-10-CM

## 2018-07-19 NOTE — Telephone Encounter (Signed)
-----   Message from Dorris Carnes V, MD sent at 07/18/2018  8:22 PM EST ----- Regarding: RE: question about hold DAPT Reviewed  She had intervention in July Less than 12 months, optimally would be on dual antiplt therapy Some increased risk for complication by stopping plavix Can she stay on ASA?  Resume plavix after procedure  ----- Message ----- From: Ladell Pier, MD Sent: 07/12/2018  11:46 AM EST To: Fay Records, MD Subject: question about hold DAPT                       I am the PCP for this patient.  She needs to have a fine-needle aspiration biopsy of the thyroid nodule.  Would it be okay for [place] to hold the aspirin and Plavix for 5 to 7 days for her to have this done?

## 2018-07-19 NOTE — Telephone Encounter (Signed)
See message below from pt's cardiologist Dr. Harrington Challenger about holding DAPT for thyroid biopsy.   PC placed to pt this a.m.  I left VM informing her that I did hear back from Dr. Harrington Challenger.  Will refer to endocrinology for further eval of LT thyroid nodule and best timing for bx.

## 2018-07-19 NOTE — Telephone Encounter (Addendum)
Will forward information below to Dr. Wynetta Emery (PCP).  I tried to reach patient, no answer so replied to her MyChart message.  Adv to check with PCP or whoever is doing procedure re: decision with Plavix since Dr. Harrington Challenger has been in communication with PCP.  Also adv to come by office for coupon copay card and we can send prescription for vascepa to a different pharmacy.

## 2018-07-24 ENCOUNTER — Telehealth (HOSPITAL_COMMUNITY): Payer: Self-pay | Admitting: Internal Medicine

## 2018-07-24 MED FILL — HYDROCHLOROTHIAZIDE 12.5 MG: 12.5 | 30 days supply | Qty: 30 | Fill #1

## 2018-07-24 MED FILL — VENLAFAXINE HCL ER 37.5 MG: 37.5 | 30 days supply | Qty: 60 | Fill #1

## 2018-07-25 MED ORDER — ICOSAPENT ETHYL 1 G PO CAPS: 2.0000 g | ORAL_CAPSULE | Freq: Two times a day (BID) | ORAL | 6 refills | Status: DC

## 2018-07-25 MED ORDER — FENOFIBRATE 160 MG PO TABS: 160.0000 mg | ORAL_TABLET | Freq: Every day | ORAL | 11 refills | Status: DC

## 2018-07-25 NOTE — Telephone Encounter (Signed)
Per pt's MyChart, sent prescription to [facility].  Has copay card.

## 2018-07-25 NOTE — Addendum Note (Signed)
Addended by: Rodman Key on: 07/25/2018 09:07 AM   Modules accepted: Orders

## 2018-07-25 NOTE — Telephone Encounter (Signed)
My Chart message:  Monique Hunt is not affordable ($200 w/ copay card).  Will send prescription for Tricor 160 mg once a day, per Dr. Harrington Challenger pt is to continue statin and have lipids checked in 8 weeks after starting fenofibrate  Replied to patient via MyChart to inform.

## 2018-07-25 NOTE — Addendum Note (Signed)
Addended by: Rodman Key on: 07/25/2018 03:30 PM   Modules accepted: Orders

## 2018-07-26 MED FILL — FENOFIBRATE 160 MG TABLET: 160 | 30 days supply | Qty: 30 | Fill #0

## 2018-07-27 ENCOUNTER — Ambulatory Visit: Payer: Self-pay | Admitting: Internal Medicine

## 2018-07-28 ENCOUNTER — Ambulatory Visit: Payer: Self-pay | Admitting: Internal Medicine

## 2018-08-02 ENCOUNTER — Ambulatory Visit (INDEPENDENT_AMBULATORY_CARE_PROVIDER_SITE_OTHER): Payer: Self-pay | Admitting: Internal Medicine

## 2018-08-02 ENCOUNTER — Encounter: Payer: Self-pay | Admitting: Internal Medicine

## 2018-08-02 VITALS — BP 122/70 | HR 63 | Temp 98.5°F | Ht 65.0 in | Wt 155.2 lb

## 2018-08-02 DIAGNOSIS — E041 Nontoxic single thyroid nodule: Secondary | ICD-10-CM

## 2018-08-02 DIAGNOSIS — R7989 Other specified abnormal findings of blood chemistry: Secondary | ICD-10-CM

## 2018-08-02 LAB — TSH: TSH: 0.49 u[IU]/mL (ref 0.35–4.50)

## 2018-08-02 LAB — T4, FREE: Free T4: 0.62 ng/dL (ref 0.60–1.60)

## 2018-08-02 NOTE — Progress Notes (Signed)
Name: Monique Hunt  MRN/ DOB: [id number], 06/15/1960    Age/ Sex: 58 y.o., female    PCP: Ladell Pier, MD   Reason for Endocrinology Evaluation: Left thyroid nodule      Date of Initial Endocrinology Evaluation: 08/02/2018     HPI: Monique Hunt is a 58 y.o. female with a past medical history of CAD(S/P PCI), HTN and dyslipidemia  . The patient presented for initial endocrinology clinic visit on 08/02/2018 for consultative assistance with her left thyroid nodule .   In review of her records the patient has been diagnosed with left thyroid nodule since 2002, she has had multiple FNAs to that nodule over the years, with benign cytology.  During a recent CT scan , the left thyroid nodule was noted again. Patient has forgotten that she had of left thyroid nodule years ago.  Her TSH at the time was low at 0.345 uIU/mL, with a normal free T4 and T3 .  Today she denies any local neck symptoms.   Denies weight loss, has occasional palpitations, and diarrhea  Denies anxiety or jittery sensation   HISTORY:  Past Medical History:  Past Medical History:  Diagnosis Date  . Coronary artery disease   . Depression   . History of kidney stones   . Hypertension   . Migraine    "stopped ~ 2010" (12/15/2017)  . NSTEMI (non-ST elevated myocardial infarction) ([place]) 12/15/2017   Past Surgical History:  Past Surgical History:  Procedure Laterality Date  . CORONARY STENT INTERVENTION N/A 12/19/2017   Procedure: CORONARY STENT INTERVENTION;  Surgeon: Belva Crome, MD;  Location: [facility];  Service: Cardiovascular;  Laterality: N/A;  . CYSTOSCOPY W/ STONE MANIPULATION    . FACIAL FRACTURE SURGERY  1979  . LEFT HEART CATH AND CORONARY ANGIOGRAPHY N/A 12/16/2017   Procedure: LEFT HEART CATH AND CORONARY ANGIOGRAPHY;  Surgeon: Belva Crome, MD;  Location: [facility];  Service: Cardiovascular;  Laterality: N/A;  . LITHOTRIPSY    . SKIN SURGERY Right    "birthmark removed under my arm when I was little"      Social History:  reports that she has never smoked. She has never used smokeless tobacco. She reports current alcohol use. She reports that she does not use drugs.  Family History: family history includes Breast cancer in her mother; CAD in her father; Heart disease in her father; Hyperlipidemia in her brother and sister.   HOME MEDICATIONS: Allergies as of 08/02/2018   No Known Allergies     Medication List       Accurate as of August 02, 2018  2:19 PM. Always use your most recent med list.        amLODipine 5 MG tablet Commonly known as:  NORVASC Take 1 tablet (5 mg total) by mouth daily.   aspirin 81 MG chewable tablet Chew 1 tablet (81 mg total) by mouth daily.   atorvastatin 80 MG tablet Commonly known as:  LIPITOR Take 1 tablet (80 mg total) by mouth daily at 6 PM.   clopidogrel 75 MG tablet Commonly known as:  PLAVIX Take 1 tablet (75 mg total) by mouth daily with breakfast.   diphenoxylate-atropine 2.5-0.025 MG tablet Commonly known as:  LOMOTIL 1-2 tabs every 6 hours prn abdominal cramping   esomeprazole 40 MG capsule Commonly known as:  NEXIUM Take 40 mg by mouth daily.   hydrochlorothiazide 12.5 MG tablet Commonly known as:  HYDRODIURIL Take  1 tablet (12.5 mg total) by mouth daily.   Icosapent Ethyl 1 g Caps Take 2 capsules (2 g total) by mouth 2 (two) times daily.   losartan 100 MG tablet Commonly known as:  COZAAR Take 1 tablet (100 mg total) by mouth daily.   metoprolol succinate 25 MG 24 hr tablet Commonly known as:  TOPROL XL Take 1 tablet (25 mg total) by mouth daily.   nitroGLYCERIN 0.4 MG SL tablet Commonly known as:  NITROSTAT Place 1 tablet (0.4 mg total) under the tongue every 5 (five) minutes x 3 doses as needed for chest pain.   venlafaxine XR 37.5 MG 24 hr capsule Commonly known as:  EFFEXOR-XR Take 1 capsule (37.5 mg total) by mouth 2 (two) times daily.         REVIEW  OF SYSTEMS: A comprehensive ROS was conducted with the patient and is negative except as per HPI and below:  Review of Systems  Constitutional: Negative for weight loss.  HENT: Negative for congestion and sore throat.   Eyes: Negative for blurred vision and pain.  Respiratory: Negative for cough and shortness of breath.   Cardiovascular: Positive for palpitations. Negative for chest pain.  Gastrointestinal: Negative for diarrhea and nausea.  Genitourinary: Negative for frequency.  Skin: Negative.   Neurological: Positive for tingling. Negative for tremors.  Endo/Heme/Allergies: Positive for polydipsia.  Psychiatric/Behavioral: Negative for depression. The patient is not nervous/anxious.        OBJECTIVE:  VS: BP 122/70 (BP Location: Right Arm, Patient Position: Sitting, Cuff Size: Normal)   Pulse 63   Temp 98.5 F (36.9 C) (Oral)   Ht 5\' 5"  (1.651 m)   Wt 155 lb 3.2 oz (70.4 kg)   LMP 02/28/2011   SpO2 96%   BMI 25.83 kg/m    Wt Readings from Last 3 Encounters:  08/02/18 155 lb 3.2 oz (70.4 kg)  07/03/18 152 lb 9.6 oz (69.2 kg)  06/29/18 145 lb (65.8 kg)     EXAM: General: Pt appears well and is in NAD  Hydration: Well-hydrated with moist mucous membranes and good skin turgor  Eyes: External eye exam normal without stare, lid lag or exophthalmos.  EOM intact.   Ears, Nose, Throat: Hearing: Grossly intact bilaterally Dental: Good dentition  Throat: Clear without mass, erythema or exudate  Neck: General: Supple without adenopathy. Thyroid: Thyroid size normal.  No goiter or nodules appreciated. No thyroid bruit.  Lungs: Clear with good BS bilat with no rales, rhonchi, or wheezes  Heart: Auscultation: RRR.  Abdomen: Normoactive bowel sounds, soft, nontender, without masses or organomegaly palpable  Extremities:  BL LE: No pretibial edema normal ROM and strength.  Skin: Hair: Texture and amount normal with gender appropriate distribution Skin Inspection: No  rashes. Skin Palpation: Skin temperature, texture, and thickness normal to palpation  Neuro: Cranial nerves: II - XII grossly intact  Motor: Normal strength throughout DTRs: 2+ and symmetric in UE without delay in relaxation phase  Mental Status: Judgment, insight: Intact Orientation: Oriented to time, place, and person Mood and affect: No depression, anxiety, or agitation     DATA REVIEWED: Results for NANCE, MCCOMBS (MRN [id number]) as of 08/04/2018 07:36  Ref. Range 04/07/2018 11:53 05/10/2018 12:18 06/29/2018 13:31 08/02/2018 14:33  TSH Latest Ref Range: 0.35 - 4.50 uIU/mL 0.345 (L)   0.49  Triiodothyronine,Free,Serum Latest Ref Range: 2.0 - 4.4 pg/mL 4.1     Triiodothyronine (T3) Latest Ref Range: 76 - 181 ng/dL    135  T4,Free(Direct)  Latest Ref Range: 0.60 - 1.60 ng/dL 0.88   0.62    FNA Left thyroid nodule (05/03/2001) FINDINGS CONSISTENT WITH NON-NEOPLASTIC GOITER (LEFT THYROID NODULE, FINE-NEEDLE ASPIRATION, THIN PREP, SMEARS, CELL BLOCK)  FNA, Left thyroid nodule (11/05/2003) FINAL DIAGNOSIS  INTERPRETATION(S): LEFT THYROID, FINE NEEDLE ASPIRATION (THIN PREP, SMEARS, CELL BLOCK): FINDINGS CONSISTENT WITH NON-NEOPLASTIC GOITER    FNA of the left thyroid Nodule (09/29/2007) FINAL DIAGNOSIS  INTERPRETATION(S): THYROID LEFT (THIN PREP, SMEARS, AND CELL BLOCK): - FINDINGS CONSISTENT WITH HYPERPLASTIC NODULE.      Thyroid Ultrasound 07/07/2018  Parenchymal Echotexture: Mildly heterogenous  Isthmus: 0.2 cm  Right lobe: 3.9 x 1.1 x 1.6 cm  Left lobe: 4.7 x 1.6 x 2.8 cm    _________________________________________________________  Estimated total number of nodules >/= 1 cm: 1  Number of spongiform nodules >/=  2 cm not described below (TR1): 0  Number of mixed cystic and solid nodules >/= 1.5 cm not described below (TR2): 0  _________________________________________________________  Nodule # 1:  Location: Left; Mid  Maximum size: 2.8 cm;  Other 2 dimensions: 2.8 x 1.6 cm  Composition: solid/almost completely solid (2)  Echogenicity: hypoechoic (2)  Shape: not taller-than-wide (0)  Margins: smooth (0)  Echogenic foci: macrocalcifications (1)  ACR TI-RADS total points: 5.  ACR TI-RADS risk category: TR4 (4-6 points).  ACR TI-RADS recommendations:  **Given size (>/= 1.5 cm) and appearance, fine needle aspiration of this moderately suspicious nodule should be considered based on TI-RADS criteria.  _________________________________________________________  IMPRESSION: Left nodule 1 meets criteria for fine needle aspiration biopsy.    Old records , labs and images have been reviewed.   ASSESSMENT/PLAN/RECOMMENDATIONS:   1. Low TSH:  -Patient is clinically euthyroid -Repeat TFTs today show normal thyroid function. -Unclear the cause of her low TSH back in November 2019, this could be a lab error, versus thyroiditis, her left thyroid nodule could be autonomous, but there is no evidence of contrast use or excessive iodine intake around that period of time.     2.  Left Thyroid Nodule:   -Patient does not have any local neck symptoms. -She has had this nodule biopsied 3 times since 2002 with benign cytology. -Per the American thyroid Association guidelines, this left thyroid nodule does not need any further monitoring once she has had 2 FNA biopsies with benign cytology. -Reassurance provided at this point. -If clinically there is evidence of rapid enlargement of the nodule, or if the patient herself notices that there is a rapid enlargement of the left thyroid nodule over a short period of time then I would recommend proceeding with a repeat ultrasound.  Follow-up in 3 months   Signed electronically by: Mack Guise, MD  [facility]  [address] [place], [place] [address] Phone: [phone] FAX: [phone]   CC: Ladell Pier, MD [place] [address] Phone: [phone] Fax: [phone]   Return to Endocrinology clinic as below: Future Appointments  Date Time Provider [place]  09/01/2018  8:30 AM [facility]  09/01/2018 10:30 AM Daune Perch, NP [facility]  09/04/2018  3:10 PM Ladell Pier, MD [facility] None

## 2018-08-03 ENCOUNTER — Ambulatory Visit (HOSPITAL_COMMUNITY): Payer: Self-pay

## 2018-08-03 MED FILL — CLOPIDOGREL 75 MG TABLET: 75 | 30 days supply | Qty: 30 | Fill #4

## 2018-08-03 MED FILL — AMLODIPINE BESYLATE 5 MG TA: 5 | 30 days supply | Qty: 30 | Fill #2

## 2018-08-04 DIAGNOSIS — R7989 Other specified abnormal findings of blood chemistry: Secondary | ICD-10-CM | POA: Insufficient documentation

## 2018-08-05 LAB — TRAB (TSH RECEPTOR BINDING ANTIBODY): TRAB: <1 IU/L (ref <=2.00)

## 2018-08-05 LAB — T3: T3 TOTAL: 135 ng/dL (ref 76–181)

## 2018-08-07 ENCOUNTER — Ambulatory Visit (HOSPITAL_COMMUNITY): Payer: Self-pay

## 2018-08-07 MED FILL — LOSARTAN POTASSIUM 100 MG T: 100 | 30 days supply | Qty: 30 | Fill #1

## 2018-08-07 MED FILL — METOPROLOL SUCCINATE ER 25: 25 | 30 days supply | Qty: 30 | Fill #1

## 2018-08-08 MED FILL — ATORVASTATIN 80 MG TABLET: 80 | 30 days supply | Qty: 30 | Fill #0

## 2018-08-09 ENCOUNTER — Ambulatory Visit (HOSPITAL_COMMUNITY): Payer: Self-pay

## 2018-08-11 ENCOUNTER — Ambulatory Visit (HOSPITAL_COMMUNITY): Payer: Self-pay

## 2018-08-14 ENCOUNTER — Ambulatory Visit (HOSPITAL_COMMUNITY): Payer: Self-pay

## 2018-08-16 ENCOUNTER — Ambulatory Visit (HOSPITAL_COMMUNITY): Payer: Self-pay

## 2018-08-18 ENCOUNTER — Ambulatory Visit (HOSPITAL_COMMUNITY): Payer: Self-pay

## 2018-08-21 ENCOUNTER — Ambulatory Visit (HOSPITAL_COMMUNITY): Payer: Self-pay

## 2018-08-23 ENCOUNTER — Ambulatory Visit (HOSPITAL_COMMUNITY): Payer: Self-pay

## 2018-08-24 MED FILL — HYDROCHLOROTHIAZIDE 12.5 MG: 12.5 | 30 days supply | Qty: 30 | Fill #2

## 2018-08-25 ENCOUNTER — Ambulatory Visit (HOSPITAL_COMMUNITY): Payer: Self-pay

## 2018-08-28 ENCOUNTER — Ambulatory Visit (HOSPITAL_COMMUNITY): Payer: Self-pay

## 2018-08-28 ENCOUNTER — Telehealth: Payer: Self-pay

## 2018-08-28 NOTE — Telephone Encounter (Signed)
Left message for patient to call back regarding appointment with Pecolia Ades, NP on 08/31/2018. Call placed due to restrictions enacted for Covid 19.

## 2018-08-29 NOTE — Telephone Encounter (Signed)
2nd attempt to reach pt in regards to appointment with Pecolia Ades, NP on 4/3. Due to covid-19 restrictions. LVM

## 2018-08-30 ENCOUNTER — Ambulatory Visit (HOSPITAL_COMMUNITY): Payer: Self-pay

## 2018-08-30 MED FILL — VENLAFAXINE HCL ER 37.5 MG: 37.5 | 30 days supply | Qty: 60 | Fill #2

## 2018-08-30 MED FILL — ?AMLODIPINE BESYLATE 5MG TA: 5 | 30 days supply | Qty: 30 | Fill #3 | Status: TO

## 2018-08-30 MED FILL — ?AMLODIPINE BESYLATE 5MG TA: 5 | 30 days supply | Qty: 30 | Fill #3

## 2018-08-30 MED FILL — LOSARTAN POTASSIUM 100 MG T: 100 | 30 days supply | Qty: 30 | Fill #2

## 2018-08-30 MED FILL — FENOFIBRATE 160 MG TABLET: 160 | 30 days supply | Qty: 30 | Fill #1

## 2018-08-30 MED FILL — CLOPIDOGREL 75 MG TABLET: 75 | 30 days supply | Qty: 30 | Fill #5

## 2018-08-30 MED FILL — ?METOPROLOL SUCC ER 25MG TA: 25 | 30 days supply | Qty: 30 | Fill #2

## 2018-08-30 NOTE — Telephone Encounter (Signed)
Pt called back about appt. She said she feels fine and will call back to reschedule when things settle down with the virus

## 2018-08-31 ENCOUNTER — Telehealth: Payer: Self-pay | Admitting: Cardiology

## 2018-08-31 NOTE — Telephone Encounter (Signed)
   Primary Cardiologist:  Dorris Carnes, MD   Based on current pandemic situation, we will be postponing this lab appointment for 4-6 weeks.   Routing to C19 CANCEL pool for tracking (P CV [facility] CV19 CANCEL - reason for visit "other.") and assigning priority (1 = 4-6 wks, 2 = 6-12 wks, 3 = >12 wks).   Ena Dawley, MD  08/31/2018 2:27 PM         .

## 2018-09-01 ENCOUNTER — Ambulatory Visit: Payer: Self-pay | Admitting: Cardiology

## 2018-09-01 ENCOUNTER — Other Ambulatory Visit: Payer: Self-pay

## 2018-09-04 ENCOUNTER — Ambulatory Visit (HOSPITAL_COMMUNITY): Payer: Self-pay

## 2018-09-04 ENCOUNTER — Ambulatory Visit: Payer: Self-pay | Admitting: Internal Medicine

## 2018-09-06 ENCOUNTER — Ambulatory Visit (HOSPITAL_COMMUNITY): Payer: Self-pay

## 2018-09-08 ENCOUNTER — Ambulatory Visit (HOSPITAL_COMMUNITY): Payer: Self-pay

## 2018-09-11 ENCOUNTER — Ambulatory Visit (HOSPITAL_COMMUNITY): Payer: Self-pay

## 2018-09-13 ENCOUNTER — Ambulatory Visit (HOSPITAL_COMMUNITY): Payer: Self-pay

## 2018-09-15 ENCOUNTER — Ambulatory Visit (HOSPITAL_COMMUNITY): Payer: Self-pay

## 2018-09-18 ENCOUNTER — Ambulatory Visit (HOSPITAL_COMMUNITY): Payer: Self-pay

## 2018-09-20 ENCOUNTER — Ambulatory Visit (HOSPITAL_COMMUNITY): Payer: Self-pay

## 2018-09-22 ENCOUNTER — Ambulatory Visit (HOSPITAL_COMMUNITY): Payer: Self-pay

## 2018-09-25 ENCOUNTER — Ambulatory Visit (HOSPITAL_COMMUNITY): Payer: Self-pay

## 2018-09-27 ENCOUNTER — Ambulatory Visit (HOSPITAL_COMMUNITY): Payer: Self-pay

## 2018-09-29 ENCOUNTER — Ambulatory Visit (HOSPITAL_COMMUNITY): Payer: Self-pay

## 2018-10-02 ENCOUNTER — Ambulatory Visit (HOSPITAL_COMMUNITY): Payer: Self-pay

## 2018-10-02 MED FILL — ATORVASTATIN 80 MG TABLET: 80 | 30 days supply | Qty: 30 | Fill #1 | Status: TO

## 2018-10-02 MED FILL — LOSARTAN POTASSIUM 100 MG T: 100 | 30 days supply | Qty: 30 | Fill #3 | Status: TO

## 2018-10-02 MED FILL — FENOFIBRATE 160 MG TABLET: 160 | 30 days supply | Qty: 30 | Fill #2 | Status: TO

## 2018-10-02 MED FILL — HYDROCHLOROTHIAZIDE 12.5 MG: 12.5 | 30 days supply | Qty: 30 | Fill #3 | Status: TO

## 2018-10-02 MED FILL — ?METOPROLOL SUCC ER 25MG TA: 25 | 30 days supply | Qty: 30 | Fill #3 | Status: TO

## 2018-10-02 MED FILL — CLOPIDOGREL 75 MG TABLET: 75 | 30 days supply | Qty: 30 | Fill #6 | Status: TO

## 2018-10-02 MED FILL — VENLAFAXINE HCL ER 37.5 MG: 37.5 | 30 days supply | Qty: 60 | Fill #3 | Status: TO

## 2018-10-04 ENCOUNTER — Ambulatory Visit (HOSPITAL_COMMUNITY): Payer: Self-pay

## 2018-10-06 ENCOUNTER — Ambulatory Visit (HOSPITAL_COMMUNITY): Payer: Self-pay

## 2018-10-09 ENCOUNTER — Ambulatory Visit (HOSPITAL_COMMUNITY): Payer: Self-pay

## 2018-10-11 ENCOUNTER — Ambulatory Visit (HOSPITAL_COMMUNITY): Payer: Self-pay

## 2018-10-13 ENCOUNTER — Ambulatory Visit (HOSPITAL_COMMUNITY): Payer: Self-pay

## 2018-10-16 ENCOUNTER — Ambulatory Visit (HOSPITAL_COMMUNITY): Payer: Self-pay

## 2018-10-18 ENCOUNTER — Ambulatory Visit (HOSPITAL_COMMUNITY): Payer: Self-pay

## 2018-10-20 ENCOUNTER — Ambulatory Visit (HOSPITAL_COMMUNITY): Payer: Self-pay

## 2018-10-24 ENCOUNTER — Telehealth: Payer: Self-pay | Admitting: Physician Assistant

## 2018-10-24 DIAGNOSIS — H1032 Unspecified acute conjunctivitis, left eye: Secondary | ICD-10-CM

## 2018-10-24 MED ORDER — OFLOXACIN 0.3 % OP SOLN: OPHTHALMIC | 0 refills | Status: DC

## 2018-10-24 MED FILL — ?OFLOXACIN 0.3 % SOLN: 0.3 | 7 days supply | Qty: 5 | Fill #0

## 2018-10-24 NOTE — Progress Notes (Signed)
We are sorry that you are not feeling well.  Here is how we plan to help!  Based on what you have shared with me it looks like you have conjunctivitis.  Conjunctivitis is a common inflammatory or infectious condition of the eye that is often referred to as "pink eye".  In most cases it is contagious (viral or bacterial). However, not all conjunctivitis requires antibiotics (ex. Allergic).  We have made appropriate suggestions for you based upon your presentation.  I have prescribed Oflaxacin 1-2 drops 4 times a day times 5 days   Pink eye can be highly contagious.  It is typically spread through direct contact with secretions, or contaminated objects or surfaces that one may have touched.  Strict handwashing is suggested with soap and water is urged.  If not available, use alcohol based had sanitizer.  Avoid unnecessary touching of the eye.  If you wear contact lenses, you will need to refrain from wearing them until you see no white discharge from the eye for at least 24 hours after being on medication.  You should see symptom improvement in 1-2 days after starting the medication regimen.  Call us if symptoms are not improved in 1-2 days.  Home Care: Wash your hands often! Do not wear your contacts until you complete your treatment plan. Avoid sharing towels, bed linen, personal items with a person who has pink eye. See attention for anyone in your home with similar symptoms.  Get Help Right Away If: Your symptoms do not improve. You develop blurred or loss of vision. Your symptoms worsen (increased discharge, pain or redness)  Your e-visit answers were reviewed by a board certified advanced clinical practitioner to complete your personal care plan.  Depending on the condition, your plan could have included both over the counter or prescription medications.  If there is a problem please reply once you have received a response from your provider.  Your safety is important to [place].  If you have  drug allergies check your prescription carefully.    You can use MyChart to ask questions about today's visit, request a non-urgent call back, or ask for a work or school excuse for 24 hours related to this e-Visit. If it has been greater than 24 hours you will need to follow up with your provider, or enter a new e-Visit to address those concerns.   You will get an e-mail in the next two days asking about your experience.  I hope that your e-visit has been valuable and will speed your recovery. Thank you for using e-visits.      ===View-only below this line===   ----- Message -----    From: [name]    Sent: 10/24/2018  8:10 AM EDT      To: E-Visit Mailing List Subject: E-Visit Submission: [facility]  E-Visit Submission: [place] --------------------------------  Question: Which of the following have you been experiencing Answer:   One eye is red            Eye pain            Eye drainage or crusting  Question: Which eye is red? Answer:   Left eye  Question: Have you had any of the following? Answer:   None of the above  Question: How long have you been having these symptoms? Answer:   For a few days  Question: Do you have a fever? Answer:   No, I do not have a fever  Question: Are  your symptoms associated with swimming? Answer:   No, I have not been swimming recently  Question: Have your eyes been exposed to any chemicals, creams, or drops that may be causing irritation? Answer:   No  Question: Have you suffered any recent injury to your eyes? Answer:   No  Question: Have you been exposed to anyone with similar symptoms? Answer:   No  Question: Do you wear contact lenses? Answer:   Yes  Question: Have you had any of the following? Answer:   None of the above  Question: Have you had any of the following in the past? Answer:   I have not had any past problems with my eyes  Question: What medications are you currently using for these  symptoms? Answer:   Nothing  Question: Please list your medication allergies that you may have ? (If 'none' , please list as 'none') Answer:   none  Question: Please list any additional comments  Answer:     A total of 5-10 minutes was spent evaluating this patients questionnaire and formulating a plan of care.

## 2018-10-25 ENCOUNTER — Ambulatory Visit (HOSPITAL_COMMUNITY): Payer: Self-pay

## 2018-10-27 ENCOUNTER — Ambulatory Visit (HOSPITAL_COMMUNITY): Payer: Self-pay

## 2018-10-30 ENCOUNTER — Ambulatory Visit (HOSPITAL_COMMUNITY): Payer: Self-pay

## 2018-11-01 ENCOUNTER — Ambulatory Visit: Payer: Self-pay | Admitting: Internal Medicine

## 2018-11-01 ENCOUNTER — Ambulatory Visit (HOSPITAL_COMMUNITY): Payer: Self-pay

## 2018-11-03 ENCOUNTER — Ambulatory Visit (HOSPITAL_COMMUNITY): Payer: Self-pay

## 2018-11-06 ENCOUNTER — Ambulatory Visit (HOSPITAL_COMMUNITY): Payer: Self-pay

## 2018-11-08 ENCOUNTER — Ambulatory Visit (HOSPITAL_COMMUNITY): Payer: Self-pay

## 2018-11-28 ENCOUNTER — Encounter: Payer: Self-pay | Admitting: Internal Medicine

## 2018-11-29 ENCOUNTER — Telehealth: Payer: Self-pay | Admitting: Licensed Clinical Social Worker

## 2018-11-29 ENCOUNTER — Telehealth: Payer: Self-pay | Admitting: Internal Medicine

## 2018-11-29 NOTE — Telephone Encounter (Signed)
Call placed to patient to follow up on behavioral health consult. LCSW was informed pt is currently at work.   LCSW left message for a return call at her earliest convenience.

## 2018-11-29 NOTE — Telephone Encounter (Signed)
Called patient and LVM to return call and schedule a televisit with PCP.

## 2018-12-01 ENCOUNTER — Other Ambulatory Visit: Payer: Self-pay | Admitting: Internal Medicine

## 2018-12-01 DIAGNOSIS — I10 Essential (primary) hypertension: Secondary | ICD-10-CM

## 2018-12-07 ENCOUNTER — Telehealth: Payer: Self-pay | Admitting: Internal Medicine

## 2018-12-07 NOTE — Telephone Encounter (Signed)
Tried to contact pt to schedule appt

## 2018-12-31 ENCOUNTER — Other Ambulatory Visit: Payer: Self-pay | Admitting: Cardiology

## 2018-12-31 ENCOUNTER — Other Ambulatory Visit: Payer: Self-pay | Admitting: Internal Medicine

## 2018-12-31 DIAGNOSIS — I10 Essential (primary) hypertension: Secondary | ICD-10-CM

## 2019-01-01 ENCOUNTER — Ambulatory Visit: Payer: Self-pay | Admitting: Internal Medicine

## 2019-01-11 ENCOUNTER — Emergency Department (HOSPITAL_COMMUNITY)
Admission: EM | Admit: 2019-01-11 | Discharge: 2019-01-11 | Disposition: A | Discharge status: Home / Self Care | Payer: Self-pay | Attending: Emergency Medicine | Admitting: Emergency Medicine

## 2019-01-11 ENCOUNTER — Other Ambulatory Visit: Payer: Self-pay

## 2019-01-11 ENCOUNTER — Emergency Department (HOSPITAL_COMMUNITY): Payer: Self-pay

## 2019-01-11 DIAGNOSIS — I259 Chronic ischemic heart disease, unspecified: Secondary | ICD-10-CM | POA: Insufficient documentation

## 2019-01-11 DIAGNOSIS — Z20828 Contact with and (suspected) exposure to other viral communicable diseases: Secondary | ICD-10-CM | POA: Insufficient documentation

## 2019-01-11 DIAGNOSIS — E860 Dehydration: Secondary | ICD-10-CM | POA: Insufficient documentation

## 2019-01-11 DIAGNOSIS — R0602 Shortness of breath: Secondary | ICD-10-CM | POA: Insufficient documentation

## 2019-01-11 DIAGNOSIS — Z79899 Other long term (current) drug therapy: Secondary | ICD-10-CM | POA: Insufficient documentation

## 2019-01-11 DIAGNOSIS — I10 Essential (primary) hypertension: Secondary | ICD-10-CM | POA: Insufficient documentation

## 2019-01-11 DIAGNOSIS — Z7982 Long term (current) use of aspirin: Secondary | ICD-10-CM | POA: Insufficient documentation

## 2019-01-11 DIAGNOSIS — I252 Old myocardial infarction: Secondary | ICD-10-CM | POA: Insufficient documentation

## 2019-01-11 DIAGNOSIS — R911 Solitary pulmonary nodule: Secondary | ICD-10-CM | POA: Insufficient documentation

## 2019-01-11 DIAGNOSIS — R0789 Other chest pain: Secondary | ICD-10-CM | POA: Insufficient documentation

## 2019-01-11 LAB — I-STAT BETA HCG BLOOD, ED (MC, WL, AP ONLY): I-stat hCG, quantitative: <5 m[IU]/mL (ref <5)

## 2019-01-11 LAB — D-DIMER, QUANTITATIVE: D-Dimer, Quant: 0.33 ug/mL-FEU (ref 0.00–0.50)

## 2019-01-11 LAB — CBC
HCT: 34.2 % — ABNORMAL LOW (ref 36.0–46.0)
Hemoglobin: 11.5 g/dL — ABNORMAL LOW (ref 12.0–15.0)
MCH: 31.4 pg (ref 26.0–34.0)
MCHC: 33.6 g/dL (ref 30.0–36.0)
MCV: 93.4 fL (ref 80.0–100.0)
Platelets: 505 10*3/uL — ABNORMAL HIGH (ref 150–400)
RBC: 3.66 MIL/uL — ABNORMAL LOW (ref 3.87–5.11)
RDW: 15.1 % (ref 11.5–15.5)
WBC: 7 10*3/uL (ref 4.0–10.5)
nRBC: 0 % (ref 0.0–0.2)

## 2019-01-11 LAB — BASIC METABOLIC PANEL
Anion gap: 15 (ref 5–15)
BUN: 33 mg/dL — ABNORMAL HIGH (ref 6–20)
CO2: 22 mmol/L (ref 22–32)
Calcium: 10.3 mg/dL (ref 8.9–10.3)
Chloride: 99 mmol/L (ref 98–111)
Creatinine, Ser: 1.38 mg/dL — ABNORMAL HIGH (ref 0.44–1.00)
GFR calc Af Amer: 49 mL/min — ABNORMAL LOW (ref >60)
GFR calc non Af Amer: 42 mL/min — ABNORMAL LOW (ref >60)
Glucose, Bld: 145 mg/dL — ABNORMAL HIGH (ref 70–99)
Potassium: 3 mmol/L — ABNORMAL LOW (ref 3.5–5.1)
Sodium: 136 mmol/L (ref 135–145)

## 2019-01-11 LAB — TROPONIN I (HIGH SENSITIVITY)
Troponin I (High Sensitivity): 5 ng/L (ref <18)
Troponin I (High Sensitivity): 5 ng/L (ref <18)

## 2019-01-11 LAB — SARS CORONAVIRUS 2 BY RT PCR (HOSPITAL ORDER, PERFORMED IN ~~LOC~~ HOSPITAL LAB): SARS Coronavirus 2: NEGATIVE

## 2019-01-11 MED ORDER — SODIUM CHLORIDE 0.9 % IV BOLUS
1000.0000 mL | Freq: Once | INTRAVENOUS | Status: AC
  Administered 2019-01-11: 1000 mL via INTRAVENOUS

## 2019-01-11 MED ORDER — SODIUM CHLORIDE 0.9% FLUSH: 3.0000 mL | Freq: Once | INTRAVENOUS | Status: DC

## 2019-01-11 MED ORDER — ALBUTEROL SULFATE HFA 108 (90 BASE) MCG/ACT IN AERS
2.0000 | INHALATION_SPRAY | Freq: Once | RESPIRATORY_TRACT | Status: AC
  Administered 2019-01-11: 2 via RESPIRATORY_TRACT
  Filled 2019-01-11: qty 6.7

## 2019-01-11 MED ORDER — POTASSIUM CHLORIDE CRYS ER 20 MEQ PO TBCR
40.0000 meq | EXTENDED_RELEASE_TABLET | Freq: Once | ORAL | Status: DC
  Filled 2019-01-11: qty 2

## 2019-01-11 NOTE — ED Notes (Signed)
Patient Alert and oriented to baseline. Stable and ambulatory to baseline. Patient verbalized understanding of the discharge instructions.  Patient belongings were taken by the patient.   

## 2019-01-11 NOTE — ED Provider Notes (Addendum)
[facility] EMERGENCY DEPARTMENT Provider Note   CSN: [id number] Arrival date & time: 01/11/19  1524     History   Chief Complaint Chief Complaint  Patient presents with  . Chest Pain  . Shortness of Breath    HPI Monique Hunt is a 58 y.o. female hx of CAD, depression, HTN, migraine, previous NSTEMI s/p stent, here presenting with shortness of breath, chest pain.  Patient states that she was at work and this afternoon had acute onset of left-sided chest pain and back pain and shortness of breath.  She states that she had MI about a year ago and had 1 stent placed.  She denies any recent travel or history of COPD or COVID contacts.      The history is provided by the patient.    Past Medical History:  Diagnosis Date  . Coronary artery disease   . Depression   . History of kidney stones   . Hypertension   . Migraine    "stopped ~ 2010" (12/15/2017)  . NSTEMI (non-ST elevated myocardial infarction) ([place]) 12/15/2017    Patient Active Problem List   Diagnosis Date Noted  . Low TSH level 08/04/2018  . Chronic diarrhea 06/05/2018  . Left thyroid nodule 04/07/2018  . Lung nodule, solitary 04/07/2018  . CAD (coronary artery disease), native coronary artery 12/19/2017  . Hyperlipidemia LDL goal <70   . NSTEMI (non-ST elevated myocardial infarction) ([place]) 12/15/2017  . Hypertension 12/01/2017  . Depression 06/04/2012  . Nephrolithiasis 09/27/2011    Past Surgical History:  Procedure Laterality Date  . CORONARY STENT INTERVENTION N/A 12/19/2017   Procedure: CORONARY STENT INTERVENTION;  Surgeon: Belva Crome, MD;  Location: [facility];  Service: Cardiovascular;  Laterality: N/A;  . CYSTOSCOPY W/ STONE MANIPULATION    . FACIAL FRACTURE SURGERY  1979  . LEFT HEART CATH AND CORONARY ANGIOGRAPHY N/A 12/16/2017   Procedure: LEFT HEART CATH AND CORONARY ANGIOGRAPHY;  Surgeon: Belva Crome, MD;  Location: [facility];  Service:  Cardiovascular;  Laterality: N/A;  . LITHOTRIPSY    . SKIN SURGERY Right    "birthmark removed under my arm when I was little"     OB History   No obstetric history on file.      Home Medications    Prior to Admission medications   Medication Sig Start Date End Date Taking? Authorizing Provider  amLODipine (NORVASC) 5 MG tablet Take 1 tablet (5 mg total) by mouth daily. 06/05/18  Yes Ladell Pier, MD  aspirin 81 MG chewable tablet Chew 1 tablet (81 mg total) by mouth daily. 12/21/17  Yes Lyda Jester M, PA-C  atorvastatin (LIPITOR) 80 MG tablet Take 1 tablet (80 mg total) by mouth daily at 6 PM. 06/05/18  Yes Ladell Pier, MD  clopidogrel (PLAVIX) 75 MG tablet TAKE ONE TABLET BY MOUTH DAILY WITH BREAKFAST Patient taking differently: Take 75 mg by mouth daily.  01/01/19  Yes Lyda Jester M, PA-C  esomeprazole (NEXIUM) 40 MG capsule Take 40 mg by mouth daily.   Yes [provider]  fenofibrate 160 MG tablet Take 160 mg by mouth daily.   Yes [provider]  hydrochlorothiazide (HYDRODIURIL) 12.5 MG tablet TAKE ONE TABLET BY MOUTH DAILY *MUST MAKE APPOINTMENT FOR FURTHER REFILLS* Patient taking differently: Take 12.5 mg by mouth daily.  01/01/19  Yes Ladell Pier, MD  losartan (COZAAR) 100 MG tablet TAKE ONE TABLET BY MOUTH DAILY Patient taking differently:  Take 100 mg by mouth daily.  01/01/19  Yes Fay Records, MD  metoprolol succinate (TOPROL-XL) 25 MG 24 hr tablet TAKE ONE TABLET BY MOUTH DAILY Patient taking differently: Take 25 mg by mouth daily.  01/01/19  Yes Fay Records, MD  nitroGLYCERIN (NITROSTAT) 0.4 MG SL tablet Place 1 tablet (0.4 mg total) under the tongue every 5 (five) minutes x 3 doses as needed for chest pain. 12/20/17  Yes Rosita Fire, Brittainy M, PA-C  venlafaxine XR (EFFEXOR-XR) 37.5 MG 24 hr capsule Take 1 capsule (37.5 mg total) by mouth 2 (two) times daily. 06/05/18  Yes Ladell Pier, MD    Family History Family History   Problem Relation Age of Onset  . CAD Father   . Heart disease Father   . Breast cancer Mother   . Hyperlipidemia Sister   . Hyperlipidemia Brother     Social History Social History   Tobacco Use  . Smoking status: Never Smoker  . Smokeless tobacco: Never Used  Substance Use Topics  . Alcohol use: Yes    Comment: 12/15/2017 "couple mixed drinks/month"  . Drug use: Never     Allergies   Patient has no known allergies.   Review of Systems Review of Systems  Respiratory: Positive for shortness of breath.   Cardiovascular: Positive for chest pain.  All other systems reviewed and are negative.    Physical Exam Updated Vital Signs BP 105/61   Pulse 78   Temp 98.2 F (36.8 C) (Oral)   Resp (!) 21   LMP 02/28/2011   SpO2 100%   Physical Exam Vitals signs and nursing note reviewed.  Constitutional:      Appearance: She is well-developed.     Comments: Slightly anxious, tachypneic   HENT:     Head: Normocephalic.  Eyes:     Extraocular Movements: Extraocular movements intact.     Pupils: Pupils are equal, round, and reactive to light.  Neck:     Musculoskeletal: Normal range of motion and neck supple.  Cardiovascular:     Rate and Rhythm: Normal rate and regular rhythm.     Heart sounds: Normal heart sounds.  Pulmonary:     Effort: Pulmonary effort is normal.  Chest:     Chest wall: No mass or deformity.  Abdominal:     General: Bowel sounds are normal.     Palpations: Abdomen is soft.  Musculoskeletal: Normal range of motion.     Comments: No spinal tenderness   Skin:    General: Skin is warm.     Capillary Refill: Capillary refill takes less than 2 seconds.  Neurological:     General: No focal deficit present.     Mental Status: She is alert and oriented to person, place, and time.  Psychiatric:        Mood and Affect: Mood normal.        Behavior: Behavior normal.      ED Treatments / Results  Labs (all labs ordered are listed, but only  abnormal results are displayed) Labs Reviewed  BASIC METABOLIC PANEL - Abnormal; Notable for the following components:      Result Value   Potassium 3.0 (*)    Glucose, Bld 145 (*)    BUN 33 (*)    Creatinine, Ser 1.38 (*)    GFR calc non Af Amer 42 (*)    GFR calc Af Amer 49 (*)    All other components within normal limits  CBC -  Abnormal; Notable for the following components:   RBC 3.66 (*)    Hemoglobin 11.5 (*)    HCT 34.2 (*)    Platelets 505 (*)    All other components within normal limits  SARS CORONAVIRUS 2 (HOSPITAL ORDER, [facility])  D-DIMER, QUANTITATIVE (NOT AT [facility])  I-STAT BETA HCG BLOOD, ED ([place], [facility], [facility] ONLY)  TROPONIN I (HIGH SENSITIVITY)  TROPONIN I (HIGH SENSITIVITY)    EKG EKG Interpretation  Date/Time:  Thursday January 11 2019 15:30:50 EDT Ventricular Rate:  101 PR Interval:  146 QRS Duration: 78 QT Interval:  394 QTC Calculation: 510 R Axis:   -5 Text Interpretation:  Sinus tachycardia Minimal voltage criteria for LVH, may be normal variant Borderline ECG No significant change since last tracing Confirmed by Wandra Arthurs ([id number]) on 01/11/2019 7:42:24 PM   Radiology Dg Chest Port 1 View  Result Date: 01/11/2019 CLINICAL DATA:  Shortness of breath. EXAM: PORTABLE CHEST 1 VIEW COMPARISON:  June 29 2018 FINDINGS: Cardiomediastinal silhouette is normal. Mediastinal contours appear intact. There is no evidence of focal airspace consolidation, pleural effusion or pneumothorax. Stable right mid lung field pulmonary nodule. Osseous structures are without acute abnormality. Soft tissues are grossly normal. IMPRESSION: No active disease. Stable right mid lung pulmonary nodule. Electronically Signed   By: Fidela Salisbury M.D.   On: 01/11/2019 16:15    Procedures Procedures (including critical care time)  Medications Ordered in ED Medications  sodium chloride flush (NS) 0.9 % injection 3 mL (has no administration in time range)   sodium chloride 0.9 % bolus 1,000 mL (0 mLs Intravenous Stopped 01/11/19 1759)  albuterol (VENTOLIN HFA) 108 (90 Base) MCG/ACT inhaler 2 puff (2 puffs Inhalation Given 01/11/19 1635)     Initial Impression / Assessment and Plan / ED Course  I have reviewed the triage vital signs and the nursing notes.  Pertinent labs & imaging results that were available during my care of the patient were reviewed by me and considered in my medical decision making (see chart for details).       ETHELEAN COLLA is a 58 y.o. female here with chest pain. Hx of MI with a stent. Compliant with her meds. Will get labs, trop x 2, CXR, COVID.   7:47 PM Labs showed K 3.0, supplemented. Cr slightly elevated, given IVF. Trop neg x 2. CXR unremarkable. Given albuterol and felt better. COVID negative. Vitals stable. Will dc home with albuterol prn.     Final Clinical Impressions(s) / ED Diagnoses   Final diagnoses:  Other chest pain  Shortness of breath    ED Discharge Orders    None       Drenda Freeze, MD 01/11/19 1948    Drenda Freeze, MD 01/11/19 1949

## 2019-01-11 NOTE — ED Triage Notes (Signed)
Per pt she has been having chest pain ,sob that radiates into her shoulder blades since this am. Pt had a MI last July. Pt said nausea but no vomiting. No cough, chills, fever. Noticing labored breathing, discomfort. Using accessory muscles

## 2019-01-11 NOTE — Discharge Instructions (Addendum)
Use albuterol as needed for shortness of breath.   Your heart function is normal and your COVID is negative   Your labs showed mild dehydration. Stay hydrated. Repeat kidney function in a week.   You have a lung nodule that is stable.   See your doctor  Return to ER if you have worse chest pain, shortness of breath

## 2019-01-12 ENCOUNTER — Ambulatory Visit: Payer: Self-pay | Admitting: Internal Medicine

## 2019-01-18 ENCOUNTER — Other Ambulatory Visit: Payer: Self-pay | Admitting: Physician Assistant

## 2019-01-29 ENCOUNTER — Encounter: Payer: Self-pay | Admitting: Internal Medicine

## 2019-01-29 NOTE — Telephone Encounter (Signed)
Please refill if appropriae

## 2019-01-30 MED FILL — LOSARTAN POTASSIUM 100 MG T: 100 | 30 days supply | Qty: 30 | Fill #0

## 2019-01-30 MED FILL — METOPROLOL SUCCINATE ER 25: 25 | 30 days supply | Qty: 30 | Fill #0

## 2019-01-30 MED FILL — HYDROCHLOROTHIAZIDE 12.5 MG: 12.5 | 30 days supply | Qty: 30 | Fill #0

## 2019-01-30 MED FILL — CLOPIDOGREL 75 MG TABLET: 75 | 30 days supply | Qty: 30 | Fill #0

## 2019-01-30 MED FILL — FENOFIBRATE 160 MG TABLET: 160 | 30 days supply | Qty: 30 | Fill #0

## 2019-01-31 ENCOUNTER — Other Ambulatory Visit: Payer: Self-pay | Admitting: Internal Medicine

## 2019-01-31 DIAGNOSIS — I10 Essential (primary) hypertension: Secondary | ICD-10-CM

## 2019-02-01 MED ORDER — NITROGLYCERIN 0.4 MG SL SUBL: 0.4000 mg | SUBLINGUAL_TABLET | SUBLINGUAL | 3 refills | Status: DC | PRN

## 2019-02-01 NOTE — Addendum Note (Signed)
Addended by: Karle Plumber B on: 02/01/2019 08:09 AM   Modules accepted: Orders

## 2019-02-26 ENCOUNTER — Ambulatory Visit: Payer: Self-pay | Attending: Internal Medicine | Admitting: Internal Medicine

## 2019-02-26 ENCOUNTER — Other Ambulatory Visit: Payer: Self-pay

## 2019-03-01 ENCOUNTER — Encounter: Payer: Self-pay | Admitting: Internal Medicine

## 2019-03-05 ENCOUNTER — Other Ambulatory Visit: Payer: Self-pay | Admitting: Internal Medicine

## 2019-03-05 DIAGNOSIS — I10 Essential (primary) hypertension: Secondary | ICD-10-CM

## 2019-03-05 MED ORDER — HYDROCHLOROTHIAZIDE 12.5 MG PO TABS: 12.5000 mg | ORAL_TABLET | Freq: Every day | ORAL | 1 refills | Status: DC

## 2019-03-05 MED ORDER — CLOPIDOGREL BISULFATE 75 MG PO TABS: 75.0000 mg | ORAL_TABLET | Freq: Every day | ORAL | 2 refills | Status: DC

## 2019-03-05 MED ORDER — FENOFIBRATE 160 MG PO TABS: 160.0000 mg | ORAL_TABLET | Freq: Every day | ORAL | 1 refills | Status: DC

## 2019-03-05 MED ORDER — LOSARTAN POTASSIUM 100 MG PO TABS: 100.0000 mg | ORAL_TABLET | Freq: Every day | ORAL | 1 refills | Status: DC

## 2019-03-05 MED ORDER — METOPROLOL SUCCINATE ER 25 MG PO TB24: 25.0000 mg | ORAL_TABLET | Freq: Every day | ORAL | 1 refills | Status: DC

## 2019-03-05 MED FILL — FENOFIBRATE 160 MG TABLET: 160 | 30 days supply | Qty: 30 | Fill #0

## 2019-03-05 MED FILL — ?HYDROCHLOROTHIAZIDE 12.5M: 12.5 | 30 days supply | Qty: 30 | Fill #0

## 2019-03-05 MED FILL — METOPROLOL SUCCINATE ER 25: 25 | 30 days supply | Qty: 30 | Fill #0

## 2019-03-05 MED FILL — CLOPIDOGREL 75 MG TABLET: 75 | 30 days supply | Qty: 30 | Fill #0

## 2019-03-05 MED FILL — LOSARTAN POTASSIUM 100 MG T: 100 | 30 days supply | Qty: 30 | Fill #0

## 2019-03-07 ENCOUNTER — Other Ambulatory Visit: Payer: Self-pay | Admitting: Cardiothoracic Surgery

## 2019-03-07 DIAGNOSIS — R911 Solitary pulmonary nodule: Secondary | ICD-10-CM

## 2019-03-19 ENCOUNTER — Encounter: Payer: Self-pay | Admitting: Internal Medicine

## 2019-03-19 DIAGNOSIS — F329 Major depressive disorder, single episode, unspecified: Secondary | ICD-10-CM

## 2019-03-19 DIAGNOSIS — F32A Depression, unspecified: Secondary | ICD-10-CM

## 2019-03-21 MED ORDER — VENLAFAXINE HCL ER 37.5 MG PO CP24: 37.5000 mg | ORAL_CAPSULE | Freq: Two times a day (BID) | ORAL | 0 refills | Status: DC

## 2019-03-21 MED FILL — VENLAFAXINE HCL ER 37.5 MG: 37.5 | 30 days supply | Qty: 60 | Fill #0

## 2019-03-29 ENCOUNTER — Other Ambulatory Visit: Payer: Self-pay

## 2019-03-29 ENCOUNTER — Ambulatory Visit: Payer: Self-pay | Attending: Internal Medicine | Admitting: Internal Medicine

## 2019-03-29 ENCOUNTER — Encounter: Payer: Self-pay | Admitting: Gastroenterology

## 2019-03-29 ENCOUNTER — Encounter: Payer: Self-pay | Admitting: Internal Medicine

## 2019-03-29 VITALS — BP 160/90 | HR 62 | Temp 97.6°F | Resp 16 | Wt 144.4 lb

## 2019-03-29 DIAGNOSIS — R1084 Generalized abdominal pain: Secondary | ICD-10-CM

## 2019-03-29 DIAGNOSIS — Z1231 Encounter for screening mammogram for malignant neoplasm of breast: Secondary | ICD-10-CM

## 2019-03-29 DIAGNOSIS — G8929 Other chronic pain: Secondary | ICD-10-CM

## 2019-03-29 DIAGNOSIS — I10 Essential (primary) hypertension: Secondary | ICD-10-CM

## 2019-03-29 DIAGNOSIS — F329 Major depressive disorder, single episode, unspecified: Secondary | ICD-10-CM

## 2019-03-29 DIAGNOSIS — R911 Solitary pulmonary nodule: Secondary | ICD-10-CM

## 2019-03-29 DIAGNOSIS — I25119 Atherosclerotic heart disease of native coronary artery with unspecified angina pectoris: Secondary | ICD-10-CM

## 2019-03-29 DIAGNOSIS — E041 Nontoxic single thyroid nodule: Secondary | ICD-10-CM

## 2019-03-29 DIAGNOSIS — F32A Depression, unspecified: Secondary | ICD-10-CM

## 2019-03-29 DIAGNOSIS — K529 Noninfective gastroenteritis and colitis, unspecified: Secondary | ICD-10-CM

## 2019-03-29 MED ORDER — AMLODIPINE BESYLATE 10 MG PO TABS: 10.0000 mg | ORAL_TABLET | Freq: Every day | ORAL | 3 refills | Status: DC

## 2019-03-29 MED FILL — ?AMLODIPINE BESYLATE 10 MG: 10 | 30 days supply | Qty: 30 | Fill #0

## 2019-03-29 NOTE — Progress Notes (Signed)
Patient ID: Monique Hunt, female    DOB: 1961-03-25  MRN: [id number]  CC: Hypertension and Abdominal Pain   Subjective: Monique Hunt is a 58 y.o. female who presents for chronic ds management Her concerns today include:  Patient with history of HTN, CAD one stent to LAD 11/2017, depression, HL, LT thyroid nodule,  RT lung nodule: Patient has had follow-up CT scan 07/2018.  This revealed a nodule in the right middle lobe is stable in size.  Repeat imaging in 1 year recommended.  Thyroid nodule: She has seen endocrinologist Dr. Kelton Pillar.  According to her note and the patient this thyroid nodule has been biopsied twice in the past with benign findings.  No further follow-up needed.  CAD/HTN: has not seen Dr. Harrington Challenger in a while Compliant with meds and wanting to get off some of them.  Reports easy bruising if she bumps against anything since being on Plavix and ASA Some CP/SOB when she has to push or carry anything at work Has a device but does not check BP  Depression:  doing much better on Effexor  C/o pain almost daily in lower abdomen, crampy pain with bloating x 6-7 mths.  Associated with diarrhea.  She notes some bright red blood in the stools about every 2 weeks. Has to take Imodium 2-4 pills a wk  Try cutting out caffine, ETOH, greasy/fried foods without relief in symptoms Loss 11 lbs since 07/2018.  She feels the wgh loss is intentional because she changed her diet No fhx of IBD.   HM: flu shot 3 wks ago [facility].  Need MMG, C-cancer screen  Patient Active Problem List   Diagnosis Date Noted  . Low TSH level 08/04/2018  . Chronic diarrhea 06/05/2018  . Left thyroid nodule 04/07/2018  . Lung nodule, solitary 04/07/2018  . CAD (coronary artery disease), native coronary artery 12/19/2017  . Hyperlipidemia LDL goal <70   . NSTEMI (non-ST elevated myocardial infarction) ([place]) 12/15/2017  . Hypertension 12/01/2017  . Depression 06/04/2012  . Nephrolithiasis 09/27/2011      Current Outpatient Medications on File Prior to Visit  Medication Sig Dispense Refill  . amLODipine (NORVASC) 5 MG tablet Take 1 tablet (5 mg total) by mouth daily. 90 tablet 3  . aspirin 81 MG chewable tablet Chew 1 tablet (81 mg total) by mouth daily. 30 tablet 11  . atorvastatin (LIPITOR) 80 MG tablet Take 1 tablet (80 mg total) by mouth daily at 6 PM. 30 tablet 6  . clopidogrel (PLAVIX) 75 MG tablet Take 1 tablet (75 mg total) by mouth daily. 30 tablet 2  . esomeprazole (NEXIUM) 40 MG capsule Take 40 mg by mouth daily.    . fenofibrate 160 MG tablet Take 1 tablet (160 mg total) by mouth daily. 30 tablet 1  . hydrochlorothiazide (HYDRODIURIL) 12.5 MG tablet Take 1 tablet (12.5 mg total) by mouth daily. 30 tablet 1  . losartan (COZAAR) 100 MG tablet Take 1 tablet (100 mg total) by mouth daily. 30 tablet 1  . metoprolol succinate (TOPROL-XL) 25 MG 24 hr tablet Take 1 tablet (25 mg total) by mouth daily. 30 tablet 1  . nitroGLYCERIN (NITROSTAT) 0.4 MG SL tablet Place 1 tablet (0.4 mg total) under the tongue every 5 (five) minutes x 3 doses as needed for chest pain. 25 tablet 3  . venlafaxine XR (EFFEXOR-XR) 37.5 MG 24 hr capsule Take 1 capsule (37.5 mg total) by mouth 2 (two) times daily. 60 capsule 0  No current facility-administered medications on file prior to visit.     No Known Allergies  Social History   Socioeconomic History  . Marital status: Divorced    Spouse name: Not on file  . Number of children: 2  . Years of education: Not on file  . Highest education level: Not on file  Occupational History  . Not on file  Social Needs  . Financial resource strain: Not on file  . Food insecurity    Worry: Not on file    Inability: Not on file  . Transportation needs    Medical: Not on file    Non-medical: Not on file  Tobacco Use  . Smoking status: Never Smoker  . Smokeless tobacco: Never Used  Substance and Sexual Activity  . Alcohol use: Yes    Comment: 12/15/2017  "couple mixed drinks/month"  . Drug use: Never  . Sexual activity: Yes    Comment: menopausal  Lifestyle  . Physical activity    Days per week: Not on file    Minutes per session: Not on file  . Stress: Not on file  Relationships  . Social Herbalist on phone: Not on file    Gets together: Not on file    Attends religious service: Not on file    Active member of club or organization: Not on file    Attends meetings of clubs or organizations: Not on file    Relationship status: Not on file  . Intimate partner violence    Fear of current or ex partner: Not on file    Emotionally abused: Not on file    Physically abused: Not on file    Forced sexual activity: Not on file  Other Topics Concern  . Not on file  Social History Narrative  . Not on file    Family History  Problem Relation Age of Onset  . CAD Father   . Heart disease Father   . Breast cancer Mother   . Hyperlipidemia Sister   . Hyperlipidemia Brother     Past Surgical History:  Procedure Laterality Date  . CORONARY STENT INTERVENTION N/A 12/19/2017   Procedure: CORONARY STENT INTERVENTION;  Surgeon: Belva Crome, MD;  Location: [facility];  Service: Cardiovascular;  Laterality: N/A;  . CYSTOSCOPY W/ STONE MANIPULATION    . FACIAL FRACTURE SURGERY  1979  . LEFT HEART CATH AND CORONARY ANGIOGRAPHY N/A 12/16/2017   Procedure: LEFT HEART CATH AND CORONARY ANGIOGRAPHY;  Surgeon: Belva Crome, MD;  Location: [facility];  Service: Cardiovascular;  Laterality: N/A;  . LITHOTRIPSY    . SKIN SURGERY Right    "birthmark removed under my arm when I was little"    ROS: Review of Systems Negative except as stated above  PHYSICAL EXAM: BP (!) 190/83   Pulse 62   Temp 97.6 F (36.4 C) (Oral)   Resp 16   Wt 144 lb 6.4 oz (65.5 kg)   LMP 02/28/2011   SpO2 98%   BMI 24.03 kg/m   Wt Readings from Last 3 Encounters:  03/29/19 144 lb 6.4 oz (65.5 kg)  08/02/18 155 lb 3.2 oz (70.4 kg)   07/03/18 152 lb 9.6 oz (69.2 kg)    Physical Exam  General appearance - alert, well appearing, and in no distress Mental status - normal mood, behavior, speech, dress, motor activity, and thought processes Mouth - mucous membranes moist, pharynx normal without lesions Neck - supple, no  significant adenopathy Chest - clear to auscultation, no wheezes, rales or rhonchi, symmetric air entry Heart - normal rate, regular rhythm, normal S1, S2, no murmurs, rubs, clicks or gallops Abdomen - soft, nontender, nondistended, no masses or organomegaly Extremities - peripheral pulses normal, no pedal edema, no clubbing or cyanosis   CMP Latest Ref Rng & Units 01/11/2019 06/29/2018 05/10/2018  Glucose 70 - 99 mg/dL 145(H) 86 103(H)  BUN 6 - 20 mg/dL 33(H) 22(H) 20  Creatinine 0.44 - 1.00 mg/dL 1.38(H) 0.97 0.75  Sodium 135 - 145 mmol/L 136 139 142  Potassium 3.5 - 5.1 mmol/L 3.0(L) 4.2 4.3  Chloride 98 - 111 mmol/L 99 104 104  CO2 22 - 32 mmol/L 22 25 21   Calcium 8.9 - 10.3 mg/dL 10.3 10.0 10.2  Total Protein 6.0 - 8.5 g/dL - - 6.8  Total Bilirubin 0.0 - 1.2 mg/dL - - 0.2  Alkaline Phos 39 - 117 IU/L - - 87  AST 0 - 40 IU/L - - 28  ALT 0 - 32 IU/L - - 39(H)   Lipid Panel     Component Value Date/Time   CHOL 249 (H) 07/03/2018 0856   TRIG 479 (H) 07/03/2018 0856   HDL 45 07/03/2018 0856   CHOLHDL 5.5 (H) 07/03/2018 0856   CHOLHDL 7.2 12/16/2017 0714   VLDL 79 (H) 12/16/2017 0714   LDLCALC Comment 07/03/2018 0856    CBC    Component Value Date/Time   WBC 7.0 01/11/2019 1534   RBC 3.66 (L) 01/11/2019 1534   HGB 11.5 (L) 01/11/2019 1534   HGB 12.1 05/10/2018 1218   HCT 34.2 (L) 01/11/2019 1534   HCT 36.3 05/10/2018 1218   PLT 505 (H) 01/11/2019 1534   PLT 391 05/10/2018 1218   MCV 93.4 01/11/2019 1534   MCV 94 05/10/2018 1218   MCH 31.4 01/11/2019 1534   MCHC 33.6 01/11/2019 1534   RDW 15.1 01/11/2019 1534   RDW 11.9 (L) 05/10/2018 1218   LYMPHSABS 1.3 05/10/2018 1218    MONOABS 0.6 02/10/2018 1741   EOSABS 0.1 05/10/2018 1218   BASOSABS 0.1 05/10/2018 1218    ASSESSMENT AND PLAN: 1. Abdominal pain, chronic, generalized 2. Chronic diarrhea Patient with some red flag symptoms of chronic abdominal pain, diarrhea, intermittent blood in the stools and weight loss.  Will refer to gastroenterology for further evaluation - Ambulatory referral to Gastroenterology  3. Essential hypertension Not at goal.  Increase Norvasc to 10 mg daily - CBC - Comprehensive metabolic panel - Lipid panel - amLODipine (NORVASC) 10 MG tablet; Take 1 tablet (10 mg total) by mouth daily.  Dispense: 90 tablet; Refill: 3  4. Coronary artery disease involving native coronary artery of native heart with angina pectoris ([place]) Clinically stable.  Needs follow-up with cardiology.  Advised that she stay on current medications for now until she speaks with the cardiologist to determine whether the Plavix can be discontinued. - Ambulatory referral to Cardiology  5. Left thyroid nodule No further work-up needed per endocrinology  6. Lung nodule, solitary She has seen vascular surgeon for this.  Had repeat CAT scan in February of this year.  1 year follow-up recommended - TSH  7. Depression, unspecified depression type Clinically stable on Effexor  8. Encounter for screening mammogram for malignant neoplasm of breast Given form for [facility] program  Patient was given the opportunity to ask questions.  Patient verbalized understanding of the plan and was able to repeat key elements of the plan.   No  orders of the defined types were placed in this encounter.    Requested Prescriptions    No prescriptions requested or ordered in this encounter    No follow-ups on file.  Karle Plumber, MD, FACP

## 2019-03-29 NOTE — Patient Instructions (Signed)
Increase Amlodipine to 10mg daily.

## 2019-03-30 ENCOUNTER — Ambulatory Visit: Payer: Self-pay | Attending: Family Medicine

## 2019-03-30 ENCOUNTER — Other Ambulatory Visit: Payer: Self-pay

## 2019-03-30 MED FILL — CLOPIDOGREL 75 MG TABLET: 75 | 30 days supply | Qty: 30 | Fill #1

## 2019-03-30 MED FILL — ?METOPROLOL SUCC ER 25MG TA: 25 | 30 days supply | Qty: 30 | Fill #1

## 2019-03-30 MED FILL — ?HYDROCHLOROTHIAZIDE 12.5M: 12.5 | 30 days supply | Qty: 30 | Fill #1

## 2019-03-30 MED FILL — LOSARTAN POTASSIUM 100 MG T: 100 | 30 days supply | Qty: 30 | Fill #1

## 2019-03-30 MED FILL — FENOFIBRATE 160 MG TABLET: 160 | 30 days supply | Qty: 30 | Fill #1

## 2019-03-31 ENCOUNTER — Other Ambulatory Visit: Payer: Self-pay | Admitting: Internal Medicine

## 2019-03-31 DIAGNOSIS — D473 Essential (hemorrhagic) thrombocythemia: Secondary | ICD-10-CM

## 2019-03-31 DIAGNOSIS — D75839 Thrombocytosis, unspecified: Secondary | ICD-10-CM

## 2019-03-31 LAB — COMPREHENSIVE METABOLIC PANEL
ALT: 31 IU/L (ref 0–32)
AST: 24 IU/L (ref 0–40)
Albumin/Globulin Ratio: 1.9 (ref 1.2–2.2)
Albumin: 4.7 g/dL (ref 3.8–4.9)
Alkaline Phosphatase: 46 IU/L (ref 39–117)
BUN/Creatinine Ratio: 36 — ABNORMAL HIGH (ref 9–23)
BUN: 31 mg/dL — ABNORMAL HIGH (ref 6–24)
Bilirubin Total: <0.2 mg/dL (ref 0.0–1.2)
CO2: 25 mmol/L (ref 20–29)
Calcium: 9.8 mg/dL (ref 8.7–10.2)
Chloride: 104 mmol/L (ref 96–106)
Creatinine, Ser: 0.87 mg/dL (ref 0.57–1.00)
GFR calc Af Amer: 84 mL/min/{1.73_m2} (ref >59)
GFR calc non Af Amer: 73 mL/min/{1.73_m2} (ref >59)
Globulin, Total: 2.5 g/dL (ref 1.5–4.5)
Glucose: 102 mg/dL — ABNORMAL HIGH (ref 65–99)
Potassium: 4.8 mmol/L (ref 3.5–5.2)
Sodium: 139 mmol/L (ref 134–144)
Total Protein: 7.2 g/dL (ref 6.0–8.5)

## 2019-03-31 LAB — LIPID PANEL
Chol/HDL Ratio: 3.8 ratio (ref 0.0–4.4)
Cholesterol, Total: 259 mg/dL — ABNORMAL HIGH (ref 100–199)
HDL: 68 mg/dL (ref >39)
LDL Chol Calc (NIH): 174 mg/dL — ABNORMAL HIGH (ref 0–99)
Triglycerides: 99 mg/dL (ref 0–149)
VLDL Cholesterol Cal: 17 mg/dL (ref 5–40)

## 2019-03-31 LAB — CBC
Hematocrit: 38 % (ref 34.0–46.6)
Hemoglobin: 12.2 g/dL (ref 11.1–15.9)
MCH: 30.6 pg (ref 26.6–33.0)
MCHC: 32.1 g/dL (ref 31.5–35.7)
MCV: 95 fL (ref 79–97)
Platelets: 521 10*3/uL — ABNORMAL HIGH (ref 150–450)
RBC: 3.99 x10E6/uL (ref 3.77–5.28)
RDW: 12.5 % (ref 11.7–15.4)
WBC: 6.9 10*3/uL (ref 3.4–10.8)

## 2019-03-31 LAB — TSH: TSH: 0.63 u[IU]/mL (ref 0.450–4.500)

## 2019-04-01 ENCOUNTER — Encounter: Payer: Self-pay | Admitting: Internal Medicine

## 2019-04-04 ENCOUNTER — Ambulatory Visit: Payer: Self-pay

## 2019-04-10 ENCOUNTER — Other Ambulatory Visit: Payer: Self-pay | Admitting: Internal Medicine

## 2019-04-10 DIAGNOSIS — I25119 Atherosclerotic heart disease of native coronary artery with unspecified angina pectoris: Secondary | ICD-10-CM

## 2019-04-20 ENCOUNTER — Other Ambulatory Visit: Payer: Self-pay | Admitting: Internal Medicine

## 2019-04-20 DIAGNOSIS — F32A Depression, unspecified: Secondary | ICD-10-CM

## 2019-04-20 DIAGNOSIS — F329 Major depressive disorder, single episode, unspecified: Secondary | ICD-10-CM

## 2019-04-20 MED FILL — VENLAFAXINE HCL ER 37.5 MG: 37.5 | 30 days supply | Qty: 60 | Fill #0

## 2019-04-20 MED FILL — ?AMLODIPINE BESYLATE 10 MG: 10 | 30 days supply | Qty: 30 | Fill #1

## 2019-05-02 ENCOUNTER — Encounter: Payer: Self-pay | Admitting: Gastroenterology

## 2019-05-02 ENCOUNTER — Ambulatory Visit: Payer: Self-pay | Admitting: Gastroenterology

## 2019-05-02 ENCOUNTER — Telehealth: Payer: Self-pay

## 2019-05-02 VITALS — BP 110/70 | HR 76 | Temp 98.3°F | Ht 65.0 in | Wt 149.2 lb

## 2019-05-02 DIAGNOSIS — Z1159 Encounter for screening for other viral diseases: Secondary | ICD-10-CM

## 2019-05-02 DIAGNOSIS — R1030 Lower abdominal pain, unspecified: Secondary | ICD-10-CM

## 2019-05-02 DIAGNOSIS — K625 Hemorrhage of anus and rectum: Secondary | ICD-10-CM

## 2019-05-02 DIAGNOSIS — K529 Noninfective gastroenteritis and colitis, unspecified: Secondary | ICD-10-CM

## 2019-05-02 MED ORDER — DICYCLOMINE HCL 10 MG PO CAPS: 20.0000 mg | ORAL_CAPSULE | Freq: Three times a day (TID) | ORAL | 1 refills | Status: DC | PRN

## 2019-05-02 MED ORDER — NA SULFATE-K SULFATE-MG SULF 17.5-3.13-1.6 GM/177ML PO SOLN: 1.0000 | Freq: Once | ORAL | 0 refills | Status: AC

## 2019-05-02 MED FILL — DICYCLOMINE 10 MG CAPSULE: 10 | 7 days supply | Qty: 45 | Fill #0

## 2019-05-02 MED FILL — LOSARTAN POTASSIUM 100 MG T: 100 | 30 days supply | Qty: 30 | Fill #1

## 2019-05-02 MED FILL — ?METOPROLOL SUCC ER 25MG TA: 25 | 30 days supply | Qty: 30 | Fill #1

## 2019-05-02 MED FILL — FENOFIBRATE 160 MG TABLET: 160 | 30 days supply | Qty: 30 | Fill #1

## 2019-05-02 MED FILL — CLOPIDOGREL 75 MG TABLET: 75 | 30 days supply | Qty: 30 | Fill #2

## 2019-05-02 MED FILL — ?HYDROCHLOROTHIAZIDE 12.5M: 12.5 | 30 days supply | Qty: 30 | Fill #1

## 2019-05-02 MED FILL — SUPREP BOWEL PREP KIT: 17.5-3.13-1 | 1 days supply | Qty: 354 | Fill #0

## 2019-05-02 NOTE — Patient Instructions (Addendum)
If you are age 59 or older, your body mass index should be between 23-30. Your Body mass index is 24.83 kg/m. If this is out of the aforementioned range listed, please consider follow up with your Primary Care Provider.  If you are age 40 or younger, your body mass index should be between 19-25. Your Body mass index is 24.83 kg/m. If this is out of the aformentioned range listed, please consider follow up with your Primary Care Provider.   You will be contacted by our office prior to your procedure for directions on holding your Plavix.  If you do not hear from our office 1 week prior to your scheduled procedure, please call [phone] to discuss.   You have been scheduled for a colonoscopy. Please follow written instructions given to you at your visit today.  Please pick up your prep supplies at the pharmacy within the next 1-3 days. If you use inhalers (even only as needed), please bring them with you on the day of your procedure. Your physician has requested that you go to www.startemmi.com and enter the access code given to you at your visit today. This web site gives a general overview about your procedure. However, you should still follow specific instructions given to you by our office regarding your preparation for the procedure.  Due to recent COVID-19 restrictions implemented by [employer] and state authorities and in an effort to keep both patients and staff as safe as possible, [facility] requires COVID-19 testing prior to any scheduled endoscopic procedure. The testing center is located at [address] [place], [place] [address] in the [facility] [employer]  suite.  Your appointment has been scheduled for 05-14-2019 at 8:50am.   Please bring your insurance cards to this appointment. You will require your COVID screen 2 business days prior to your endoscopic procedure.  You are not required to quarantine after your screening.   You will only receive a phone call with the results if it is POSITIVE.  If you do not receive a call the day before your procedure you should begin your prep, if ordered, and you should report to the endo center for your procedure at your designated appointment arrival time ( one hour prior to the procedure time). There is no cost to you for the screening on the day of the swab.  [facility] will file with your insurance company for the testing.    You may receive an automated phone call prior to your procedure or have a message in your MyChart that you have an appointment for a BP/15 at the [facility], please disregard this message.  Your testing will be at the [place] [place] location.   If you are leaving [facility] travel [place] on [address], turn left onto [address], turn night onto [address] at the 1st stop light turn right, pass the [employer] on your right and proceed to [place] (white building).   It was a pleasure to see you today!  Dr. Loletha Carrow

## 2019-05-02 NOTE — Telephone Encounter (Signed)
Dr. Harrington Challenger, can Plavix be held for 5 days for colonoscopy?  Pt had DES to LAD 12/19/2017. She was last seen in the office on 07/03/18.   Please route response back to [facility] PREOP  Thank you

## 2019-05-02 NOTE — Progress Notes (Signed)
Monique Hunt:  History: Monique Hunt 05/02/2019  Referring provider: Ladell Pier, MD  Reason for consult/chief complaint: Abdominal Pain (lower pain prior to bowel movements, multiple movements daily) and Diarrhea (Chronic with urgency)   Subjective  HPI:  Monique Hunt is a very pleasant 58 year old woman referred by primary care for about 6 months of lower abdominal pain and diarrhea. Without any clear trigger, she started having frequent mid lower abdominal sharp and crampy pain before bowel movements with feelings of urgency.  She would have several semiformed to loose BMs per day.  Perhaps every couple of weeks there would be some self-limited painless rectal bleeding with the diarrhea as well. Her appetite is remained good, she had some purposeful weight loss through dieting earlier this year but stable since.  She denies dysphagia, odynophagia, nausea or vomiting.  She has not had any particular testing for this lately.  Symptoms did not improve cutting out dairy, caffeine, alcohol.  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  ROS:  Review of Systems  Constitutional: Positive for fatigue. Negative for appetite change and unexpected weight change.  HENT: Negative for mouth sores and voice change.   Eyes: Negative for pain and redness.  Respiratory: Negative for cough and shortness of breath.   Cardiovascular: Negative for chest pain and palpitations.  Genitourinary: Negative for dysuria and hematuria.  Musculoskeletal: Negative for arthralgias and myalgias.  Skin: Negative for pallor and rash.  Neurological: Negative for weakness and headaches.  Hematological: Negative for adenopathy.     Past Medical History: Past Medical History:  Diagnosis Date  . Coronary artery disease   . Depression   . History of kidney stones   . Hypertension   . Migraine    "stopped ~ 2010"  (12/15/2017)  . NSTEMI (non-ST elevated myocardial infarction) ([place]) 12/15/2017     Past Surgical History: Past Surgical History:  Procedure Laterality Date  . CORONARY STENT INTERVENTION N/A 12/19/2017   Procedure: CORONARY STENT INTERVENTION;  Surgeon: Belva Crome, MD;  Location: [facility];  Service: Cardiovascular;  Laterality: N/A;  . CYSTOSCOPY W/ STONE MANIPULATION    . FACIAL FRACTURE SURGERY  1979  . LEFT HEART CATH AND CORONARY ANGIOGRAPHY N/A 12/16/2017   Procedure: LEFT HEART CATH AND CORONARY ANGIOGRAPHY;  Surgeon: Belva Crome, MD;  Location: [facility];  Service: Cardiovascular;  Laterality: N/A;  . LITHOTRIPSY    . SKIN SURGERY Right    "birthmark removed under my arm when I was little"     Family History: Family History  Problem Relation Age of Onset  . CAD Father   . Heart disease Father   . Breast cancer Mother   . Hyperlipidemia Sister   . Hyperlipidemia Brother     Social History: Social History   Socioeconomic History  . Marital status: Divorced    Spouse name: Not on file  . Number of children: 2  . Years of education: Not on file  . Highest education level: Not on file  Occupational History  . Not on file  Social Needs  . Financial resource strain: Not on file  . Food insecurity    Worry: Not on file    Inability: Not on file  . Transportation needs    Medical: Not on file    Non-medical: Not on file  Tobacco Use  . Smoking status: Never Smoker  . Smokeless tobacco:  Never Used  Substance and Sexual Activity  . Alcohol use: Yes    Comment: 12/15/2017 "couple mixed drinks/month"  . Drug use: Never  . Sexual activity: Yes    Comment: menopausal  Lifestyle  . Physical activity    Days per week: Not on file    Minutes per session: Not on file  . Stress: Not on file  Relationships  . Social Herbalist on phone: Not on file    Gets together: Not on file    Attends religious service: Not on file    Active  member of club or organization: Not on file    Attends meetings of clubs or organizations: Not on file    Relationship status: Not on file  Other Topics Concern  . Not on file  Social History Narrative  . Not on file    Allergies: No Known Allergies  Outpatient Meds: Current Outpatient Medications  Medication Sig Dispense Refill  . amLODipine (NORVASC) 10 MG tablet Take 1 tablet (10 mg total) by mouth daily. 90 tablet 3  . aspirin 81 MG chewable tablet Chew 1 tablet (81 mg total) by mouth daily. 30 tablet 11  . atorvastatin (LIPITOR) 80 MG tablet TAKE ONE TABLET BY MOUTH DAILY AT 6:00 IN THE EVENING 30 tablet 3  . clopidogrel (PLAVIX) 75 MG tablet Take 1 tablet (75 mg total) by mouth daily. 30 tablet 2  . esomeprazole (NEXIUM) 40 MG capsule Take 40 mg by mouth daily.    . fenofibrate 160 MG tablet Take 1 tablet (160 mg total) by mouth daily. 30 tablet 1  . hydrochlorothiazide (HYDRODIURIL) 12.5 MG tablet Take 1 tablet (12.5 mg total) by mouth daily. 30 tablet 1  . losartan (COZAAR) 100 MG tablet Take 1 tablet (100 mg total) by mouth daily. 30 tablet 1  . metoprolol succinate (TOPROL-XL) 25 MG 24 hr tablet Take 1 tablet (25 mg total) by mouth daily. 30 tablet 1  . nitroGLYCERIN (NITROSTAT) 0.4 MG SL tablet Place 1 tablet (0.4 mg total) under the tongue every 5 (five) minutes x 3 doses as needed for chest pain. 25 tablet 3  . venlafaxine XR (EFFEXOR-XR) 37.5 MG 24 hr capsule TAKE 1 CAPSULE (37.5 MG TOTAL) BY MOUTH 2 (TWO) TIMES DAILY. 60 capsule 2  . dicyclomine (BENTYL) 10 MG capsule Take 2 capsules (20 mg total) by mouth every 8 (eight) hours as needed for spasms. For abdominal cramps and diarrhea 45 capsule 1  . Na Sulfate-K Sulfate-Mg Sulf 17.5-3.13-1.6 GM/177ML SOLN Take 1 kit by mouth once for 1 dose. 354 mL 0   No current facility-administered medications for this visit.       ___________________________________________________________________ Objective   Exam:  BP 110/70    Pulse 76   Temp 98.3 F (36.8 C)   Ht 5' 5"  (1.651 m)   Wt 149 lb 3.2 oz (67.7 kg)   LMP 02/28/2011   BMI 24.83 kg/m    General: Well-appearing  Eyes: sclera anicteric, no redness  ENT: oral mucosa moist without lesions, no cervical or supraclavicular lymphadenopathy  CV: RRR without murmur, S1/S2, no JVD, no peripheral edema  Resp: clear to auscultation bilaterally, normal RR and effort noted  GI: soft, no tenderness, with active bowel sounds. No guarding or palpable organomegaly noted.  Skin; warm and dry, no rash or jaundice noted  Neuro: awake, alert and oriented x 3. Normal gross motor function and fluent speech  Labs:  CBC Latest Ref Rng & Units  03/30/2019 01/11/2019 06/29/2018  WBC 3.4 - 10.8 x10E3/uL 6.9 7.0 7.0  Hemoglobin 11.1 - 15.9 g/dL 12.2 11.5(L) 12.0  Hematocrit 34.0 - 46.6 % 38.0 34.2(L) 37.0  Platelets 150 - 450 x10E3/uL 521(H) 505(H) 363   CMP Latest Ref Rng & Units 03/30/2019 01/11/2019 06/29/2018  Glucose 65 - 99 mg/dL 102(H) 145(H) 86  BUN 6 - 24 mg/dL 31(H) 33(H) 22(H)  Creatinine 0.57 - 1.00 mg/dL 0.87 1.38(H) 0.97  Sodium 134 - 144 mmol/L 139 136 139  Potassium 3.5 - 5.2 mmol/L 4.8 3.0(L) 4.2  Chloride 96 - 106 mmol/L 104 99 104  CO2 20 - 29 mmol/L 25 22 25   Calcium 8.7 - 10.2 mg/dL 9.8 10.3 10.0  Total Protein 6.0 - 8.5 g/dL 7.2 - -  Total Bilirubin 0.0 - 1.2 mg/dL <0.2 - -  Alkaline Phos 39 - 117 IU/L 46 - -  AST 0 - 40 IU/L 24 - -  ALT 0 - 32 IU/L 31 - -   Last Hgb A1C 6.0 in July 2019  Recent nml TSH nml TSH and Free T4 March 2020   Assessment: Encounter Diagnoses  Name Primary?  . Lower abdominal pain Yes  . Chronic diarrhea   . Rectal bleeding   . Screening for viral disease     About 6 months of lower abdominal pain with diarrhea.  Intermittent rectal bleeding that sounds likely anorectal and related to the diarrhea, especially since she is on aspirin and Plavix. Possible IBD, IBS.  Neoplasia seems unlikely with the  symptoms, but has also had no prior colon cancer screening.  Plan:  Trial of dicyclomine 20 mg every 8 hours as needed Colonoscopy.  She was agreeable after discussion of procedure and risks.  The benefits and risks of the planned procedure were described in detail with the patient or (when appropriate) their health care proxy.  Risks were outlined as including, but not limited to, bleeding, infection, perforation, adverse medication reaction leading to cardiac or pulmonary decompensation, pancreatitis (if ERCP).  The limitation of incomplete mucosal visualization was also discussed.  No guarantees or warranties were given.  She would need to be off Plavix 5 days prior, and we will message her cardiologist about this.  Since her stent was nearly 18 months ago, I expect they will be agreeable to that.  Megumi understands the small but real risk of cardiovascular event while briefly off her Plavix while remaining on aspirin.  Patient at increased risk for cardiopulmonary complications of procedure due to medical comorbidities.  Chi is currently uninsured, but reports having a financial arrangement with [facility] that should make this feasible.  Thank you for the courtesy of this consult.  Please call me with any questions or concerns.  Nelida Meuse III  CC: Referring provider noted above

## 2019-05-02 NOTE — Telephone Encounter (Signed)
[facility] Pre-operative Risk Assessment     Request for surgical clearance:     Endoscopy Procedure  What type of surgery is being performed?     Colonoscopy   When is this surgery scheduled?     05-14-2019  What type of clearance is required ?   Pharmacy  Are there any medications that need to be held prior to surgery and how long? Yes, Plavix, 5 days  Practice name and name of physician performing surgery?      [facility]  What is your office phone and fax number?      Phone- [phone]  [phone]  Anesthesia type (None, local, MAC, general) ?       MAC

## 2019-05-03 NOTE — Telephone Encounter (Signed)
Yes, OK to hold   I would resume after when safe

## 2019-05-04 ENCOUNTER — Ambulatory Visit: Payer: Self-pay | Attending: Internal Medicine | Admitting: Internal Medicine

## 2019-05-04 ENCOUNTER — Other Ambulatory Visit: Payer: Self-pay

## 2019-05-04 ENCOUNTER — Encounter: Payer: Self-pay | Admitting: Internal Medicine

## 2019-05-04 VITALS — BP 152/84 | HR 89 | Temp 97.9°F | Resp 16 | Wt 150.8 lb

## 2019-05-04 DIAGNOSIS — S300XXA Contusion of lower back and pelvis, initial encounter: Secondary | ICD-10-CM

## 2019-05-04 DIAGNOSIS — Z1231 Encounter for screening mammogram for malignant neoplasm of breast: Secondary | ICD-10-CM

## 2019-05-04 DIAGNOSIS — Z124 Encounter for screening for malignant neoplasm of cervix: Secondary | ICD-10-CM

## 2019-05-04 DIAGNOSIS — R58 Hemorrhage, not elsewhere classified: Secondary | ICD-10-CM

## 2019-05-04 NOTE — Telephone Encounter (Signed)
Left a message to return call.  

## 2019-05-04 NOTE — Telephone Encounter (Signed)
   Primary Cardiologist: Dorris Carnes, MD  Chart reviewed as part of pre-operative protocol coverage. Given past medical history and time since last visit, based on ACC/AHA guidelines, Monique Hunt would be at acceptable risk for the planned procedure without further cardiovascular testing.   Per Dr. Harrington Challenger, it will be acceptable to hold Plavix for 5 days prior to colonoscopy and resume when safe from a procedural standpoint.   I will route this recommendation to the requesting party via Epic fax function and remove from pre-op pool.  Please call with questions.  Kathyrn Drown, NP 05/04/2019, 8:26 AM

## 2019-05-04 NOTE — Progress Notes (Signed)
Patient ID: NNENNA THACKSTON, female    DOB: [birth year]  MRN: [id number]  CC: Gynecologic Exam   Subjective: Creda Tondreau is a 58 y.o. female who presents for pap Her concerns today include:  Patient with history of HTN, CADone stent to LAD 11/2017, depression, HL, LT thyroid nodule,  Pt is G3P2 Last pap was 6 yrs ago.  No abn in past No vaginal dischg/itching.  Not sexually active Fhx of breast CA in mother.  Fell at work 4 days ago.  She was pulling a box off a shelf and fell backwards landing on buttock on concrete floor in green house.  Declined seeing company doctor but did file a report.  RT Buttock is bruised, painful and swollen.  Taking 2-3 Aleves 2-3 x a day.   Iced it.  Swelling has gone down but still painful especially at night.  Patient is on Plavix.  Patient Active Problem List   Diagnosis Date Noted  . Low TSH level 08/04/2018  . Chronic diarrhea 06/05/2018  . Left thyroid nodule 04/07/2018  . Lung nodule, solitary 04/07/2018  . CAD (coronary artery disease), native coronary artery 12/19/2017  . Hyperlipidemia LDL goal <70   . NSTEMI (non-ST elevated myocardial infarction) ([place]) 12/15/2017  . Hypertension 12/01/2017  . Depression 06/04/2012  . Nephrolithiasis 09/27/2011     Current Outpatient Medications on File Prior to Visit  Medication Sig Dispense Refill  . amLODipine (NORVASC) 10 MG tablet Take 1 tablet (10 mg total) by mouth daily. 90 tablet 3  . aspirin 81 MG chewable tablet Chew 1 tablet (81 mg total) by mouth daily. 30 tablet 11  . atorvastatin (LIPITOR) 80 MG tablet TAKE ONE TABLET BY MOUTH DAILY AT 6:00 IN THE EVENING 30 tablet 3  . clopidogrel (PLAVIX) 75 MG tablet Take 1 tablet (75 mg total) by mouth daily. 30 tablet 2  . dicyclomine (BENTYL) 10 MG capsule Take 2 capsules (20 mg total) by mouth every 8 (eight) hours as needed for spasms. For abdominal cramps and diarrhea 45 capsule 1  . esomeprazole (NEXIUM) 40 MG capsule Take 40 mg by mouth  daily.    . fenofibrate 160 MG tablet Take 1 tablet (160 mg total) by mouth daily. 30 tablet 1  . hydrochlorothiazide (HYDRODIURIL) 12.5 MG tablet Take 1 tablet (12.5 mg total) by mouth daily. 30 tablet 1  . losartan (COZAAR) 100 MG tablet Take 1 tablet (100 mg total) by mouth daily. 30 tablet 1  . metoprolol succinate (TOPROL-XL) 25 MG 24 hr tablet Take 1 tablet (25 mg total) by mouth daily. 30 tablet 1  . nitroGLYCERIN (NITROSTAT) 0.4 MG SL tablet Place 1 tablet (0.4 mg total) under the tongue every 5 (five) minutes x 3 doses as needed for chest pain. 25 tablet 3  . venlafaxine XR (EFFEXOR-XR) 37.5 MG 24 hr capsule TAKE 1 CAPSULE (37.5 MG TOTAL) BY MOUTH 2 (TWO) TIMES DAILY. 60 capsule 2   No current facility-administered medications on file prior to visit.     No Known Allergies  Social History   Socioeconomic History  . Marital status: Divorced    Spouse name: Not on file  . Number of children: 2  . Years of education: Not on file  . Highest education level: Not on file  Occupational History  . Not on file  Social Needs  . Financial resource strain: Not on file  . Food insecurity    Worry: Not on file    Inability: Not on  file  . Transportation needs    Medical: Not on file    Non-medical: Not on file  Tobacco Use  . Smoking status: Never Smoker  . Smokeless tobacco: Never Used  Substance and Sexual Activity  . Alcohol use: Yes    Comment: 12/15/2017 "couple mixed drinks/month"  . Drug use: Never  . Sexual activity: Yes    Comment: menopausal  Lifestyle  . Physical activity    Days per week: Not on file    Minutes per session: Not on file  . Stress: Not on file  Relationships  . Social Herbalist on phone: Not on file    Gets together: Not on file    Attends religious service: Not on file    Active member of club or organization: Not on file    Attends meetings of clubs or organizations: Not on file    Relationship status: Not on file  . Intimate  partner violence    Fear of current or ex partner: Not on file    Emotionally abused: Not on file    Physically abused: Not on file    Forced sexual activity: Not on file  Other Topics Concern  . Not on file  Social History Narrative  . Not on file    Family History  Problem Relation Age of Onset  . CAD Father   . Heart disease Father   . Breast cancer Mother   . Hyperlipidemia Sister   . Hyperlipidemia Brother     Past Surgical History:  Procedure Laterality Date  . CORONARY STENT INTERVENTION N/A 12/19/2017   Procedure: CORONARY STENT INTERVENTION;  Surgeon: Belva Crome, MD;  Location: [facility];  Service: Cardiovascular;  Laterality: N/A;  . CYSTOSCOPY W/ STONE MANIPULATION    . FACIAL FRACTURE SURGERY  1979  . LEFT HEART CATH AND CORONARY ANGIOGRAPHY N/A 12/16/2017   Procedure: LEFT HEART CATH AND CORONARY ANGIOGRAPHY;  Surgeon: Belva Crome, MD;  Location: [facility];  Service: Cardiovascular;  Laterality: N/A;  . LITHOTRIPSY    . SKIN SURGERY Right    "birthmark removed under my arm when I was little"    ROS: Review of Systems Negative except as stated above  PHYSICAL EXAM: BP (!) 152/84   Pulse 89   Temp 97.9 F (36.6 C) (Oral)   Resp 16   Wt 150 lb 12.8 oz (68.4 kg)   LMP 02/28/2011   SpO2 100%   BMI 25.09 kg/m   Physical Exam  General appearance - alert, well appearing, and in no distress Mental status - normal mood, behavior, speech, dress, motor activity, and thought processes Breasts - breasts appear normal, no suspicious masses, no skin or nipple changes or axillary nodes Pelvic -CMA Sallyanne Havers is present.  Normal external genitalia, vulva, vagina, cervix, uterus and adnexa.  Cervical os is stenosed. Skin: Significant bruising of the right buttock extending to the mid posterior right thigh.  She also has small area of bruising noted on the medial aspect of the left buttock and left posterior thigh.  Slight swelling noted of the right  buttock but area is not hot or firm to touch  CMP Latest Ref Rng & Units 03/30/2019 01/11/2019 06/29/2018  Glucose 65 - 99 mg/dL 102(H) 145(H) 86  BUN 6 - 24 mg/dL 31(H) 33(H) 22(H)  Creatinine 0.57 - 1.00 mg/dL 0.87 1.38(H) 0.97  Sodium 134 - 144 mmol/L 139 136 139  Potassium 3.5 - 5.2 mmol/L  4.8 3.0(L) 4.2  Chloride 96 - 106 mmol/L 104 99 104  CO2 20 - 29 mmol/L 25 22 25   Calcium 8.7 - 10.2 mg/dL 9.8 10.3 10.0  Total Protein 6.0 - 8.5 g/dL 7.2 - -  Total Bilirubin 0.0 - 1.2 mg/dL <0.2 - -  Alkaline Phos 39 - 117 IU/L 46 - -  AST 0 - 40 IU/L 24 - -  ALT 0 - 32 IU/L 31 - -   Lipid Panel     Component Value Date/Time   CHOL 259 (H) 03/30/2019 0910   TRIG 99 03/30/2019 0910   HDL 68 03/30/2019 0910   CHOLHDL 3.8 03/30/2019 0910   CHOLHDL 7.2 12/16/2017 0714   VLDL 79 (H) 12/16/2017 0714   LDLCALC 174 (H) 03/30/2019 0910    CBC    Component Value Date/Time   WBC 6.9 03/30/2019 0910   WBC 7.0 01/11/2019 1534   RBC 3.99 03/30/2019 0910   RBC 3.66 (L) 01/11/2019 1534   HGB 12.2 03/30/2019 0910   HCT 38.0 03/30/2019 0910   PLT 521 (H) 03/30/2019 0910   MCV 95 03/30/2019 0910   MCH 30.6 03/30/2019 0910   MCH 31.4 01/11/2019 1534   MCHC 32.1 03/30/2019 0910   MCHC 33.6 01/11/2019 1534   RDW 12.5 03/30/2019 0910   LYMPHSABS 1.3 05/10/2018 1218   MONOABS 0.6 02/10/2018 1741   EOSABS 0.1 05/10/2018 1218   BASOSABS 0.1 05/10/2018 1218    ASSESSMENT AND PLAN: 1. Pap smear for cervical cancer screening - Cytology - PAP([place])  2. Encounter for screening mammogram for malignant neoplasm of breast - MM Digital Screening; Future  3. Traumatic ecchymosis of buttock, initial encounter 4. Multiple ecchymoses of thigh -Patient with significant bruising post fall.  She is on aspirin and Plavix.  She may have sustained hematoma of the buttock.  However the area though swollen is not firm to touch.  I recommend warm compresses.  She declines something stronger for pain. -cbc     Patient was given the opportunity to ask questions.  Patient verbalized understanding of the plan and was able to repeat key elements of the plan.   Orders Placed This Encounter  Procedures  . MM Digital Screening  . CBC     Requested Prescriptions    No prescriptions requested or ordered in this encounter    No follow-ups on file.  Karle Plumber, MD, FACP

## 2019-05-04 NOTE — Progress Notes (Signed)
Pt states she fell Monday and bruised her butt

## 2019-05-05 LAB — CBC
Hematocrit: 32.2 % — ABNORMAL LOW (ref 34.0–46.6)
Hemoglobin: 10.6 g/dL — ABNORMAL LOW (ref 11.1–15.9)
MCH: 30.6 pg (ref 26.6–33.0)
MCHC: 32.9 g/dL (ref 31.5–35.7)
MCV: 93 fL (ref 79–97)
Platelets: 440 10*3/uL (ref 150–450)
RBC: 3.46 x10E6/uL — ABNORMAL LOW (ref 3.77–5.28)
RDW: 12.6 % (ref 11.7–15.4)
WBC: 6.6 10*3/uL (ref 3.4–10.8)

## 2019-05-06 ENCOUNTER — Other Ambulatory Visit: Payer: Self-pay | Admitting: Internal Medicine

## 2019-05-06 DIAGNOSIS — S300XXA Contusion of lower back and pelvis, initial encounter: Secondary | ICD-10-CM

## 2019-05-07 LAB — CYTOLOGY - PAP
Comment: NEGATIVE
Diagnosis: NEGATIVE
High risk HPV: NEGATIVE

## 2019-05-08 NOTE — Telephone Encounter (Signed)
Patient has been notified and aware. She states clear understanding  

## 2019-05-14 ENCOUNTER — Other Ambulatory Visit (HOSPITAL_COMMUNITY)
Admission: RE | Admit: 2019-05-14 | Discharge: 2019-05-14 | Disposition: A | Discharge status: Home / Self Care | Payer: Self-pay | Source: Ambulatory Visit | Attending: Gastroenterology | Admitting: Gastroenterology

## 2019-05-14 DIAGNOSIS — Z01812 Encounter for preprocedural laboratory examination: Secondary | ICD-10-CM | POA: Insufficient documentation

## 2019-05-14 DIAGNOSIS — Z20828 Contact with and (suspected) exposure to other viral communicable diseases: Secondary | ICD-10-CM | POA: Insufficient documentation

## 2019-05-14 LAB — SARS CORONAVIRUS 2 (TAT 6-24 HRS): SARS Coronavirus 2: NEGATIVE

## 2019-05-14 MED FILL — SUPREP BOWEL PREP KIT: 17.5-3.13-1 | 1 days supply | Qty: 354 | Fill #0

## 2019-05-16 ENCOUNTER — Other Ambulatory Visit: Payer: Self-pay

## 2019-05-16 ENCOUNTER — Ambulatory Visit (AMBULATORY_SURGERY_CENTER): Payer: Self-pay | Admitting: Gastroenterology

## 2019-05-16 ENCOUNTER — Encounter: Payer: Self-pay | Admitting: Gastroenterology

## 2019-05-16 VITALS — BP 126/72 | HR 62 | Resp 26 | Ht 65.0 in | Wt 149.0 lb

## 2019-05-16 DIAGNOSIS — R1030 Lower abdominal pain, unspecified: Secondary | ICD-10-CM

## 2019-05-16 DIAGNOSIS — D123 Benign neoplasm of transverse colon: Secondary | ICD-10-CM

## 2019-05-16 DIAGNOSIS — K529 Noninfective gastroenteritis and colitis, unspecified: Secondary | ICD-10-CM

## 2019-05-16 DIAGNOSIS — K625 Hemorrhage of anus and rectum: Secondary | ICD-10-CM

## 2019-05-16 MED ORDER — SODIUM CHLORIDE 0.9 % IV SOLN: 500.0000 mL | Freq: Once | INTRAVENOUS | Status: DC

## 2019-05-16 NOTE — Patient Instructions (Signed)
Handouts  Given:   Diverticulosis and polyps Resume Plavix tomorrow Await Pathology results      YOU HAD AN ENDOSCOPIC PROCEDURE TODAY AT [facility]:   Refer to the procedure report that was given to you for any specific questions about what was found during the examination.  If the procedure report does not answer your questions, please call your gastroenterologist to clarify.  If you requested that your care partner not be given the details of your procedure findings, then the procedure report has been included in a sealed envelope for you to review at your convenience later.  YOU SHOULD EXPECT: Some feelings of bloating in the abdomen. Passage of more gas than usual.  Walking can help get rid of the air that was put into your GI tract during the procedure and reduce the bloating. If you had a lower endoscopy (such as a colonoscopy or flexible sigmoidoscopy) you may notice spotting of blood in your stool or on the toilet paper. If you underwent a bowel prep for your procedure, you may not have a normal bowel movement for a few days.  Please Note:  You might notice some irritation and congestion in your nose or some drainage.  This is from the oxygen used during your procedure.  There is no need for concern and it should clear up in a day or so.  SYMPTOMS TO REPORT IMMEDIATELY:   Following lower endoscopy (colonoscopy or flexible sigmoidoscopy):  Excessive amounts of blood in the stool  Significant tenderness or worsening of abdominal pains  Swelling of the abdomen that is new, acute  Fever of 100F or higher   For urgent or emergent issues, a gastroenterologist can be reached at any hour by calling [phone].   DIET:  We do recommend a small meal at first, but then you may proceed to your regular diet.  Drink plenty of fluids but you should avoid alcoholic beverages for 24 hours.  ACTIVITY:  You should plan to take it easy for the rest of today and you should NOT  DRIVE or use heavy machinery until tomorrow (because of the sedation medicines used during the test).    FOLLOW UP: Our staff will call the number listed on your records 48-72 hours following your procedure to check on you and address any questions or concerns that you may have regarding the information given to you following your procedure. If we do not reach you, we will leave a message.  We will attempt to reach you two times.  During this call, we will ask if you have developed any symptoms of COVID 19. If you develop any symptoms (ie: fever, flu-like symptoms, shortness of breath, cough etc.) before then, please call [phone].  If you test positive for Covid 19 in the 2 weeks post procedure, please call and report this information to [place].    If any biopsies were taken you will be contacted by phone or by letter within the next 1-3 weeks.  Please call us at [phone] if you have not heard about the biopsies in 3 weeks.    SIGNATURES/CONFIDENTIALITY: You and/or your care partner have signed paperwork which will be entered into your electronic medical record.  These signatures attest to the fact that that the information above on your After Visit Summary has been reviewed and is understood.  Full responsibility of the confidentiality of this discharge information lies with you and/or your care-partner.

## 2019-05-16 NOTE — Progress Notes (Signed)
Temp taken by [facility] VS taken by DT

## 2019-05-16 NOTE — Progress Notes (Signed)
To PACU, VSS. Report to [email or web address] 

## 2019-05-16 NOTE — Op Note (Signed)
[facility] Patient Name: Monique Hunt Procedure Date: 05/16/2019 9:54 AM MRN: [id number] Endoscopist: Paulina. Loletha Hunt , MD Age: 58 Referring MD:  Date of Birth: Jul 13, 1960 Gender: Female Account #: [id number] Procedure:                Colonoscopy Indications:              Lower abdominal pain, Chronic diarrhea, Rectal                            bleeding Medicines:                Monitored Anesthesia Care Procedure:                Pre-Anesthesia Assessment:                           - Prior to the procedure, a History and Physical                            was performed, and patient medications and                            allergies were reviewed. The patient's tolerance of                            previous anesthesia was also reviewed. The risks                            and benefits of the procedure and the sedation                            options and risks were discussed with the patient.                            All questions were answered, and informed consent                            was obtained. Prior Anticoagulants: The patient                            last took Pletal (cilostazol) 5 days prior to the                            procedure (remained on aspirin). ASA Grade                            Assessment: III - A patient with severe systemic                            disease. After reviewing the risks and benefits,                            the patient was deemed in satisfactory condition to  undergo the procedure.                           After obtaining informed consent, the colonoscope                            was passed under direct vision. Throughout the                            procedure, the patient's blood pressure, pulse, and                            oxygen saturations were monitored continuously. The                            Colonoscope was introduced through the anus and   advanced to the the terminal ileum, with                            identification of the appendiceal orifice and IC                            valve. The colonoscopy was performed without                            difficulty. The patient tolerated the procedure                            well. The quality of the bowel preparation was                            good. The terminal ileum, ileocecal valve,                            appendiceal orifice, and rectum were photographed. Scope In: 10:00:46 AM Scope Out: 10:18:30 AM Total Procedure Duration: 0 hours 17 minutes 44 seconds  Findings:                 The perianal and digital rectal examinations were                            normal.                           The terminal ileum appeared normal.                           Diverticula were found in the entire colon.                           A diminutive polyp was found in the transverse                            colon. The polyp was sessile. The polyp was removed  with a cold snare. Resection and retrieval were                            complete.                           Normal mucosa was found in the entire colon.                            Biopsies for histology were taken with a cold                            forceps from the right colon and left colon for                            evaluation of microscopic colitis. Complications:            No immediate complications. Estimated Blood Loss:     Estimated blood loss was minimal. Impression:               - The examined portion of the ileum was normal.                           - Diverticulosis in the entire examined colon.                           - One diminutive polyp in the transverse colon,                            removed with a cold snare. Resected and retrieved.                           - Normal mucosa in the entire examined colon.                            Biopsied.                            Benign anal bleeding from diarrhea while on DAPT. Recommendation:           - Patient has a contact number available for                            emergencies. The signs and symptoms of potential                            delayed complications were discussed with the                            patient. Return to normal activities tomorrow.                            Written discharge instructions were provided to the  patient.                           - Resume previous diet.                           - Resume Plavix (clopidogrel) at prior dose                            tomorrow.                           - Await pathology results.                           - Repeat colonoscopy is recommended for                            surveillance. The colonoscopy date will be                            determined after pathology results from today's                            exam become available for review. Monique Ashland L. Loletha Carrow, MD 05/16/2019 10:27:47 AM This report has been signed electronically.

## 2019-05-16 NOTE — Progress Notes (Signed)
Called to room to assist during endoscopic procedure.  Patient ID and intended procedure confirmed with present staff. Received instructions for my participation in the procedure from the performing physician.  

## 2019-05-18 ENCOUNTER — Telehealth: Payer: Self-pay | Admitting: *Deleted

## 2019-05-18 NOTE — Telephone Encounter (Signed)
  Follow up Call-  Call back number 05/16/2019  Post procedure Call Back phone  # [phone]  Permission to leave phone message Yes  Some recent data might be hidden     Patient questions:  Do you have a fever, pain , or abdominal swelling? No. Pain Score  0 *  Have you tolerated food without any problems? Yes.    Have you been able to return to your normal activities? Yes.    Do you have any questions about your discharge instructions: Diet   No. Medications  No. Follow up visit  No.  Do you have questions or concerns about your Care? No.  Actions: * If pain score is 4 or above: No action needed, pain <4.  1. Have you developed a fever since your procedure? no  2.   Have you had an respiratory symptoms (SOB or cough) since your procedure? no  3.   Have you tested positive for COVID 19 since your procedure no  4.   Have you had any family members/close contacts diagnosed with the COVID 19 since your procedure?  no   If yes to any of these questions please route to Joylene John, RN and Alphonsa Gin, Therapist, sports.

## 2019-05-21 ENCOUNTER — Encounter: Payer: Self-pay | Admitting: Gastroenterology

## 2019-05-30 MED FILL — FENOFIBRATE 160 MG TABLET: 160 | 30 days supply | Qty: 30 | Fill #2

## 2019-05-30 MED FILL — ?AMLODIPINE BESYLATE 10 MG: 10 | 30 days supply | Qty: 30 | Fill #2

## 2019-05-30 MED FILL — HYDROCHLOROTHIAZIDE 12.5 MG: 12.5 | 30 days supply | Qty: 30 | Fill #2

## 2019-05-30 MED FILL — ?METOPROLOL SUCC ER 25MG TA: 25 | 30 days supply | Qty: 30 | Fill #2

## 2019-06-06 ENCOUNTER — Other Ambulatory Visit: Payer: Self-pay | Admitting: Internal Medicine

## 2019-06-06 DIAGNOSIS — I25119 Atherosclerotic heart disease of native coronary artery with unspecified angina pectoris: Secondary | ICD-10-CM

## 2019-06-06 MED FILL — ATORVASTATIN 80 MG TABLET: 80 | 30 days supply | Qty: 30 | Fill #0

## 2019-06-06 MED FILL — LOSARTAN POTASSIUM 100 MG T: 100 | 30 days supply | Qty: 30 | Fill #2

## 2019-06-06 MED FILL — CLOPIDOGREL 75 MG TABLET: 75 | 30 days supply | Qty: 30 | Fill #1

## 2019-06-17 NOTE — Progress Notes (Signed)
Cardiology Office Note   Date:  06/18/2019   ID:  Monique Hunt, DOB 1961-02-25, MRN [id number]  PCP:  Ladell Pier, MD  Cardiologist: Dr. Harrington Challenger  F/U of CAD    History of Present Illness: Monique Hunt is a 59 y.o. female who presents for f/u of coronary artery disease.  She was admitted to the hospital in July 2010.  Catheterization revealed 75% hazy LAD stenosis, normal left main, normal circumflex with marked tortuosity/angulation of innominate aortic arch bifurcation.  Dominant RCA with moderate mid and distal vessel tortuosity and marked tortuosity of the PDA and LV branches.  Normal LVEF of 55%.  The patient had PCI of the proximal mid LAD and a staged procedure via femoral approach after being loaded with Plavix.  The patient was treated with aspirin high intensity statin beta-blocker and ACE.  I last saw the pt in Feb 2020 she complained of some dyspnea at that visit.  I set her up for stress echocardiogram.  This was normal.  Patient had a colonoscopy done in the fall.  One diminutive polyp was seen.   The pt says her breathing is OK   No CP   No dizziness  No palptiations.    In the interval patient denies chest pain, breathing is okay.  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  Does note some numbness in her fingertips and toes.    Past Medical History:  Diagnosis Date  . Coronary artery disease   . Depression   . History of kidney stones   . Hyperlipidemia   . Hypertension   . Migraine    "stopped ~ 2010" (12/15/2017)  . NSTEMI (non-ST elevated myocardial infarction) ([facility]) 12/15/2017    Past Surgical History:  Procedure Laterality Date  . CORONARY STENT INTERVENTION N/A 12/19/2017   Procedure: CORONARY STENT INTERVENTION;  Surgeon: Belva Crome, MD;  Location: [facility];  Service: Cardiovascular;  Laterality: N/A;  . CYSTOSCOPY W/ STONE MANIPULATION    . FACIAL FRACTURE SURGERY  1979  . LEFT HEART CATH AND CORONARY ANGIOGRAPHY N/A  12/16/2017   Procedure: LEFT HEART CATH AND CORONARY ANGIOGRAPHY;  Surgeon: Belva Crome, MD;  Location: [facility];  Service: Cardiovascular;  Laterality: N/A;  . LITHOTRIPSY    . SKIN SURGERY Right    "birthmark removed under my arm when I was little"     Current Outpatient Medications  Medication Sig Dispense Refill  . amLODipine (NORVASC) 10 MG tablet Take 1 tablet (10 mg total) by mouth daily. 90 tablet 3  . aspirin 81 MG chewable tablet Chew 1 tablet (81 mg total) by mouth daily. 30 tablet 11  . atorvastatin (LIPITOR) 80 MG tablet TAKE 1 TABLET (80 MG TOTAL) BY MOUTH DAILY AT 6 PM. 30 tablet 2  . clopidogrel (PLAVIX) 75 MG tablet Take 1 tablet (75 mg total) by mouth daily. 30 tablet 2  . dicyclomine (BENTYL) 10 MG capsule Take 2 capsules (20 mg total) by mouth every 8 (eight) hours as needed for spasms. For abdominal cramps and diarrhea 45 capsule 1  . esomeprazole (NEXIUM) 40 MG capsule Take 40 mg by mouth daily.    . fenofibrate 160 MG tablet Take 1 tablet (160 mg total) by mouth daily. 30 tablet 1  . hydrochlorothiazide (HYDRODIURIL) 12.5 MG tablet Take 1 tablet (12.5 mg total) by mouth daily. 30 tablet 1  . losartan (COZAAR) 100 MG tablet Take 1 tablet (100 mg total) by  mouth daily. 30 tablet 1  . metoprolol succinate (TOPROL-XL) 25 MG 24 hr tablet Take 1 tablet (25 mg total) by mouth daily. 30 tablet 1  . nitroGLYCERIN (NITROSTAT) 0.4 MG SL tablet Place 1 tablet (0.4 mg total) under the tongue every 5 (five) minutes x 3 doses as needed for chest pain. 25 tablet 3  . venlafaxine XR (EFFEXOR-XR) 37.5 MG 24 hr capsule TAKE 1 CAPSULE (37.5 MG TOTAL) BY MOUTH 2 (TWO) TIMES DAILY. 60 capsule 2   No current facility-administered medications for this visit.    Allergies:   Patient has no known allergies.    Social History:  The patient  reports that she has never smoked. She has never used smokeless tobacco. She reports current alcohol use. She reports that she does not use  drugs.   Family History:  The patient's family history includes Breast cancer in her mother; CAD in her father; Heart disease in her father; Hyperlipidemia in her brother and sister.    ROS: All other systems are reviewed and negative. Unless otherwise mentioned in H&P    PHYSICAL EXAM: VS:  BP (!) 110/58   Pulse 86   Ht 5\' 5"  (1.651 m)   Wt 149 lb 12.8 oz (67.9 kg)   LMP 02/28/2011   SpO2 97%   BMI 24.93 kg/m  , BMI Body mass index is 24.93 kg/m. GEN: Well nourished, well developed, in no acute distress  HEENT: normal  Neck: JV pis normal   No carotid bruits, or masses Cardiac: RRR; no murmurs, rubs, or gallops,no lower extremity edema good distal pulses Respiratory:  clear to auscultation bilaterally, normal work of breathing GI: soft, nontender, nondistended, + BS MS: no deformity or atrophy  Skin: warm and dry, no rash Neuro:  Strength and sensation are intact Psych: euthymic mood, full affect, tearful    EKG: EKG is not ordered  today.   Recent Labs: 03/30/2019: ALT 31; BUN 31; Creatinine, Ser 0.87; Potassium 4.8; Sodium 139; TSH 0.630 05/04/2019: Hemoglobin 10.6; Platelets 440    Lipid Panel    Component Value Date/Time   CHOL 259 (H) 03/30/2019 0910   TRIG 99 03/30/2019 0910   HDL 68 03/30/2019 0910   CHOLHDL 3.8 03/30/2019 0910   CHOLHDL 7.2 12/16/2017 0714   VLDL 79 (H) 12/16/2017 0714   LDLCALC 174 (H) 03/30/2019 0910      Wt Readings from Last 3 Encounters:  06/18/19 149 lb 12.8 oz (67.9 kg)  05/16/19 149 lb (67.6 kg)  05/04/19 150 lb 12.8 oz (68.4 kg)      Other studies Reviewed: Cardiac Cath 12/19/2017  A stent was successfully placed.    Successful mid LAD DES reducing a 75% eccentric stenosis proximal to a bifurcation to 0% with TIMI grade III flow.  The stent was a Synergy 3.5 x 12 which was postdilated to 3.75 mm in diameter.  RECOMMENDATIONS:  Discharge in a.m.  Risk factor modification: Blood pressure 130/80 mmHg or less,  hemoglobin A1c less than 70.,  Phase 2 cardiac rehab to incorporate aerobic activity into style.  Recommend uninterrupted dual antiplatelet therapy with Aspirin 81mg  daily and75mg  daily for a minimum of 12 months (ACS - Class I recommendation).  ASSESSMENT AND PLAN:  1. CAD: The patient is status post PTCA stent to the LAD.  She had an excellent result.  Note it was a bifurcation lesion.  Stress echo done about  1 year ago was normal.  She is doing well clinically.  We will  check labs consider discontinuation of Plavix.  2  HTN  BP is controlled continue meds  3. Palpitations: Denies    4.  Hypercholesterolemia: Repeat lipids today.  LDL in the fall was 174.    5.  Numbness bilateral hands toes.  Good pulses throughout.  Question some small vessel spasm.  I would discuss with primary MD for possible neuropathy.    6.  COVID.  Patient would be in a class II for vaccine.  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  I encouraged her to wear eye protection.  Plan for follow-up in the fall.    Current medicines are reviewed at length with the patient today.    Nevin Bloodgood RossMD

## 2019-06-18 ENCOUNTER — Encounter: Payer: Self-pay | Admitting: Internal Medicine

## 2019-06-18 ENCOUNTER — Other Ambulatory Visit: Payer: Self-pay

## 2019-06-18 ENCOUNTER — Ambulatory Visit (INDEPENDENT_AMBULATORY_CARE_PROVIDER_SITE_OTHER): Payer: Self-pay | Admitting: Internal Medicine

## 2019-06-18 VITALS — BP 110/58 | HR 86 | Ht 65.0 in | Wt 149.8 lb

## 2019-06-18 DIAGNOSIS — E7800 Pure hypercholesterolemia, unspecified: Secondary | ICD-10-CM

## 2019-06-18 DIAGNOSIS — I2510 Atherosclerotic heart disease of native coronary artery without angina pectoris: Secondary | ICD-10-CM

## 2019-06-18 LAB — LIPID PANEL
Chol/HDL Ratio: 3 ratio (ref 0.0–4.4)
Cholesterol, Total: 200 mg/dL — ABNORMAL HIGH (ref 100–199)
HDL: 67 mg/dL (ref >39)
LDL Chol Calc (NIH): 115 mg/dL — ABNORMAL HIGH (ref 0–99)
Triglycerides: 104 mg/dL (ref 0–149)
VLDL Cholesterol Cal: 18 mg/dL (ref 5–40)

## 2019-06-18 LAB — CBC
Hematocrit: 35.2 % (ref 34.0–46.6)
Hemoglobin: 11.5 g/dL (ref 11.1–15.9)
MCH: 30.4 pg (ref 26.6–33.0)
MCHC: 32.7 g/dL (ref 31.5–35.7)
MCV: 93 fL (ref 79–97)
Platelets: 497 10*3/uL — ABNORMAL HIGH (ref 150–450)
RBC: 3.78 x10E6/uL (ref 3.77–5.28)
RDW: 13.2 % (ref 11.7–15.4)
WBC: 5 10*3/uL (ref 3.4–10.8)

## 2019-06-18 LAB — BASIC METABOLIC PANEL
BUN/Creatinine Ratio: 31 — ABNORMAL HIGH (ref 9–23)
BUN: 29 mg/dL — ABNORMAL HIGH (ref 6–24)
CO2: 25 mmol/L (ref 20–29)
Calcium: 9.9 mg/dL (ref 8.7–10.2)
Chloride: 98 mmol/L (ref 96–106)
Creatinine, Ser: 0.95 mg/dL (ref 0.57–1.00)
GFR calc Af Amer: 76 mL/min/{1.73_m2} (ref >59)
GFR calc non Af Amer: 66 mL/min/{1.73_m2} (ref >59)
Glucose: 105 mg/dL — ABNORMAL HIGH (ref 65–99)
Potassium: 3.6 mmol/L (ref 3.5–5.2)
Sodium: 138 mmol/L (ref 134–144)

## 2019-06-18 NOTE — Patient Instructions (Signed)
Medication Instructions:  No changes today *If you need a refill on your cardiac medications before your next appointment, please call your pharmacy*  Lab Work: Today: cbc, bmet, lipids If you have labs (blood work) drawn today and your tests are completely normal, you will receive your results only by: Marland Kitchen MyChart Message (if you have MyChart) OR . A paper copy in the mail If you have any lab test that is abnormal or we need to change your treatment, we will call you to review the results.  Testing/Procedures: none  Follow-Up: At [facility], you and your health needs are our priority.  As part of our continuing mission to provide you with exceptional heart care, we have created designated Provider Care Teams.  These Care Teams include your primary Cardiologist (physician) and Advanced Practice Providers (APPs -  Physician Assistants and Nurse Practitioners) who all work together to provide you with the care you need, when you need it.  Your next appointment:   10 month(s)  The format for your next appointment:   Either In Person or Virtual  Provider:   You may see Dorris Carnes, MD or one of the following Advanced Practice Providers on your designated Care Team:    Richardson Dopp, PA-C  [place], [place]  Daune Perch, NP   Other Instructions

## 2019-06-21 ENCOUNTER — Other Ambulatory Visit: Payer: Self-pay | Admitting: *Deleted

## 2019-06-21 DIAGNOSIS — I2510 Atherosclerotic heart disease of native coronary artery without angina pectoris: Secondary | ICD-10-CM

## 2019-06-21 DIAGNOSIS — E7800 Pure hypercholesterolemia, unspecified: Secondary | ICD-10-CM

## 2019-06-21 MED ORDER — EZETIMIBE 10 MG PO TABS: 10.0000 mg | ORAL_TABLET | Freq: Every day | ORAL | 3 refills | Status: DC

## 2019-06-21 MED FILL — VENLAFAXINE HCL ER 37.5 MG: 37.5 | 30 days supply | Qty: 60 | Fill #1

## 2019-06-28 MED FILL — METOPROLOL SUCCINATE ER 25: 25 | 30 days supply | Qty: 30 | Fill #3

## 2019-06-28 MED FILL — AMLODIPINE BESYLATE 10 MG T: 10 | 30 days supply | Qty: 30 | Fill #3

## 2019-06-28 MED FILL — HYDROCHLOROTHIAZIDE 12.5 MG: 12.5 | 30 days supply | Qty: 30 | Fill #3

## 2019-07-03 ENCOUNTER — Encounter: Payer: Self-pay | Admitting: Internal Medicine

## 2019-07-03 DIAGNOSIS — J069 Acute upper respiratory infection, unspecified: Secondary | ICD-10-CM

## 2019-07-04 ENCOUNTER — Ambulatory Visit: Payer: Self-pay | Attending: Internal Medicine

## 2019-07-04 DIAGNOSIS — Z20822 Contact with and (suspected) exposure to covid-19: Secondary | ICD-10-CM | POA: Insufficient documentation

## 2019-07-05 LAB — NOVEL CORONAVIRUS, NAA: SARS-CoV-2, NAA: NOT DETECTED

## 2019-07-05 MED FILL — FENOFIBRATE 160 MG TABLET: 160 | 30 days supply | Qty: 30 | Fill #3

## 2019-07-05 MED FILL — CLOPIDOGREL 75 MG TABLET: 75 | 30 days supply | Qty: 30 | Fill #2

## 2019-07-05 MED FILL — LOSARTAN POTASSIUM 100 MG T: 100 | 30 days supply | Qty: 30 | Fill #3

## 2019-07-25 ENCOUNTER — Other Ambulatory Visit: Payer: Self-pay | Admitting: Internal Medicine

## 2019-07-25 MED FILL — AMLODIPINE BESYLATE 10 MG T: 10 | 30 days supply | Qty: 30 | Fill #4

## 2019-07-25 MED FILL — HYDROCHLOROTHIAZIDE 12.5 MG: 12.5 | 30 days supply | Qty: 30 | Fill #0

## 2019-07-25 MED FILL — METOPROLOL SUCCINATE ER 25: 25 | 30 days supply | Qty: 30 | Fill #4

## 2019-08-03 ENCOUNTER — Other Ambulatory Visit: Payer: Self-pay | Admitting: Internal Medicine

## 2019-08-03 MED FILL — CLOPIDOGREL 75 MG TABLET: 75 | 30 days supply | Qty: 30 | Fill #3

## 2019-08-03 MED FILL — FENOFIBRATE 160 MG TABLET: 160 | 30 days supply | Qty: 30 | Fill #0

## 2019-08-03 MED FILL — LOSARTAN POTASSIUM 100 MG T: 100 | 30 days supply | Qty: 30 | Fill #4

## 2019-08-08 ENCOUNTER — Ambulatory Visit: Payer: Self-pay | Attending: Internal Medicine

## 2019-08-08 DIAGNOSIS — Z20822 Contact with and (suspected) exposure to covid-19: Secondary | ICD-10-CM | POA: Insufficient documentation

## 2019-08-09 LAB — NOVEL CORONAVIRUS, NAA: SARS-CoV-2, NAA: NOT DETECTED

## 2019-08-13 MED FILL — VENLAFAXINE HCL ER 37.5 MG: 37.5 | 30 days supply | Qty: 60 | Fill #2

## 2019-08-27 ENCOUNTER — Other Ambulatory Visit: Payer: Self-pay | Admitting: Internal Medicine

## 2019-08-27 MED FILL — AMLODIPINE BESYLATE 10 MG T: 10 | 30 days supply | Qty: 30 | Fill #5

## 2019-08-27 MED FILL — ATORVASTATIN 80 MG TABLET: 80 | 30 days supply | Qty: 30 | Fill #0 | Status: TO

## 2019-08-27 MED FILL — HYDROCHLOROTHIAZIDE 12.5 MG: 12.5 | 30 days supply | Qty: 30 | Fill #1

## 2019-08-27 MED FILL — ?METOPROLOL SUCC ER 25MG TA: 25 | 30 days supply | Qty: 30 | Fill #0

## 2019-09-05 ENCOUNTER — Other Ambulatory Visit: Payer: Self-pay

## 2019-09-05 ENCOUNTER — Encounter (HOSPITAL_COMMUNITY): Payer: Self-pay | Admitting: Pediatrics

## 2019-09-05 ENCOUNTER — Emergency Department (HOSPITAL_COMMUNITY)
Admission: EM | Admit: 2019-09-05 | Discharge: 2019-09-06 | Disposition: A | Discharge status: Home / Self Care | Payer: Self-pay | Attending: Emergency Medicine | Admitting: Emergency Medicine

## 2019-09-05 ENCOUNTER — Emergency Department (HOSPITAL_COMMUNITY): Payer: Self-pay

## 2019-09-05 DIAGNOSIS — I252 Old myocardial infarction: Secondary | ICD-10-CM | POA: Insufficient documentation

## 2019-09-05 DIAGNOSIS — Z5321 Procedure and treatment not carried out due to patient leaving prior to being seen by health care provider: Secondary | ICD-10-CM | POA: Insufficient documentation

## 2019-09-05 DIAGNOSIS — M542 Cervicalgia: Secondary | ICD-10-CM | POA: Insufficient documentation

## 2019-09-05 DIAGNOSIS — R002 Palpitations: Secondary | ICD-10-CM | POA: Insufficient documentation

## 2019-09-05 LAB — BASIC METABOLIC PANEL
Anion gap: 18 — ABNORMAL HIGH (ref 5–15)
BUN: 24 mg/dL — ABNORMAL HIGH (ref 6–20)
CO2: 19 mmol/L — ABNORMAL LOW (ref 22–32)
Calcium: 9.6 mg/dL (ref 8.9–10.3)
Chloride: 101 mmol/L (ref 98–111)
Creatinine, Ser: 1.24 mg/dL — ABNORMAL HIGH (ref 0.44–1.00)
GFR calc Af Amer: 55 mL/min — ABNORMAL LOW (ref >60)
GFR calc non Af Amer: 48 mL/min — ABNORMAL LOW (ref >60)
Glucose, Bld: 104 mg/dL — ABNORMAL HIGH (ref 70–99)
Potassium: 3.2 mmol/L — ABNORMAL LOW (ref 3.5–5.1)
Sodium: 138 mmol/L (ref 135–145)

## 2019-09-05 LAB — TROPONIN I (HIGH SENSITIVITY)
Troponin I (High Sensitivity): 10 ng/L (ref <18)
Troponin I (High Sensitivity): 25 ng/L — ABNORMAL HIGH (ref <18)

## 2019-09-05 LAB — CBC
HCT: 36.8 % (ref 36.0–46.0)
Hemoglobin: 12.3 g/dL (ref 12.0–15.0)
MCH: 30.5 pg (ref 26.0–34.0)
MCHC: 33.4 g/dL (ref 30.0–36.0)
MCV: 91.3 fL (ref 80.0–100.0)
Platelets: 491 10*3/uL — ABNORMAL HIGH (ref 150–400)
RBC: 4.03 MIL/uL (ref 3.87–5.11)
RDW: 14.1 % (ref 11.5–15.5)
WBC: 6.9 10*3/uL (ref 4.0–10.5)
nRBC: 0 % (ref 0.0–0.2)

## 2019-09-05 LAB — PROTIME-INR
INR: 1.1 (ref 0.8–1.2)
Prothrombin Time: 13.6 seconds (ref 11.4–15.2)

## 2019-09-05 MED ORDER — SODIUM CHLORIDE 0.9% FLUSH: 3.0000 mL | Freq: Once | INTRAVENOUS | Status: DC

## 2019-09-05 MED FILL — ?FENOFIBRATE 160 MG TABS: 160 | 30 days supply | Qty: 30 | Fill #1

## 2019-09-05 NOTE — ED Notes (Signed)
No response for vitals x3  

## 2019-09-05 NOTE — ED Triage Notes (Signed)
Arrived via EMS; from work; reported she experienced some palpitations and back pain ; endorsed hx of MI and 1 stent placement; ASA 324 given PTA + NTG x1 and pt stated no relief

## 2019-09-06 ENCOUNTER — Encounter: Payer: Self-pay | Admitting: Internal Medicine

## 2019-09-06 NOTE — Telephone Encounter (Signed)
I tried to reach this patient by phone to tell her to return to the ER.  Called the person on her DPR also.  Had to leave messages. Her second HS troponin last night was elevated at 25.  She left before seeing a doctor.

## 2019-09-07 ENCOUNTER — Other Ambulatory Visit: Payer: Self-pay

## 2019-09-07 ENCOUNTER — Emergency Department (HOSPITAL_COMMUNITY)
Admission: EM | Admit: 2019-09-07 | Discharge: 2019-09-07 | Disposition: A | Discharge status: Home / Self Care | Payer: Self-pay | Attending: Emergency Medicine | Admitting: Emergency Medicine

## 2019-09-07 ENCOUNTER — Telehealth: Payer: Self-pay | Admitting: Internal Medicine

## 2019-09-07 ENCOUNTER — Emergency Department (HOSPITAL_COMMUNITY): Payer: Self-pay

## 2019-09-07 ENCOUNTER — Encounter (HOSPITAL_COMMUNITY): Payer: Self-pay

## 2019-09-07 DIAGNOSIS — M546 Pain in thoracic spine: Secondary | ICD-10-CM | POA: Insufficient documentation

## 2019-09-07 DIAGNOSIS — Z79899 Other long term (current) drug therapy: Secondary | ICD-10-CM | POA: Insufficient documentation

## 2019-09-07 DIAGNOSIS — Z955 Presence of coronary angioplasty implant and graft: Secondary | ICD-10-CM | POA: Insufficient documentation

## 2019-09-07 DIAGNOSIS — R0602 Shortness of breath: Secondary | ICD-10-CM | POA: Insufficient documentation

## 2019-09-07 DIAGNOSIS — I2510 Atherosclerotic heart disease of native coronary artery without angina pectoris: Secondary | ICD-10-CM | POA: Insufficient documentation

## 2019-09-07 DIAGNOSIS — Z7901 Long term (current) use of anticoagulants: Secondary | ICD-10-CM | POA: Insufficient documentation

## 2019-09-07 DIAGNOSIS — E876 Hypokalemia: Secondary | ICD-10-CM | POA: Insufficient documentation

## 2019-09-07 DIAGNOSIS — I252 Old myocardial infarction: Secondary | ICD-10-CM | POA: Insufficient documentation

## 2019-09-07 DIAGNOSIS — I10 Essential (primary) hypertension: Secondary | ICD-10-CM | POA: Insufficient documentation

## 2019-09-07 DIAGNOSIS — Z7982 Long term (current) use of aspirin: Secondary | ICD-10-CM | POA: Insufficient documentation

## 2019-09-07 LAB — BASIC METABOLIC PANEL
Anion gap: 12 (ref 5–15)
BUN: 32 mg/dL — ABNORMAL HIGH (ref 6–20)
CO2: 26 mmol/L (ref 22–32)
Calcium: 10 mg/dL (ref 8.9–10.3)
Chloride: 102 mmol/L (ref 98–111)
Creatinine, Ser: 1.05 mg/dL — ABNORMAL HIGH (ref 0.44–1.00)
GFR calc Af Amer: >60 mL/min (ref >60)
GFR calc non Af Amer: 58 mL/min — ABNORMAL LOW (ref >60)
Glucose, Bld: 112 mg/dL — ABNORMAL HIGH (ref 70–99)
Potassium: 3.4 mmol/L — ABNORMAL LOW (ref 3.5–5.1)
Sodium: 140 mmol/L (ref 135–145)

## 2019-09-07 LAB — CBC
HCT: 38.9 % (ref 36.0–46.0)
Hemoglobin: 12.6 g/dL (ref 12.0–15.0)
MCH: 30.5 pg (ref 26.0–34.0)
MCHC: 32.4 g/dL (ref 30.0–36.0)
MCV: 94.2 fL (ref 80.0–100.0)
Platelets: 455 10*3/uL — ABNORMAL HIGH (ref 150–400)
RBC: 4.13 MIL/uL (ref 3.87–5.11)
RDW: 14.3 % (ref 11.5–15.5)
WBC: 5.5 10*3/uL (ref 4.0–10.5)
nRBC: 0 % (ref 0.0–0.2)

## 2019-09-07 LAB — D-DIMER, QUANTITATIVE: D-Dimer, Quant: 0.39 ug/mL-FEU (ref 0.00–0.50)

## 2019-09-07 LAB — TROPONIN I (HIGH SENSITIVITY)
Troponin I (High Sensitivity): 6 ng/L (ref <18)
Troponin I (High Sensitivity): 5 ng/L (ref <18)

## 2019-09-07 MED ORDER — ASPIRIN 81 MG PO CHEW
162.0000 mg | CHEWABLE_TABLET | Freq: Once | ORAL | Status: AC
  Administered 2019-09-07: 162 mg via ORAL
  Filled 2019-09-07: qty 2

## 2019-09-07 NOTE — ED Notes (Signed)
Pt transported to xray 

## 2019-09-07 NOTE — ED Triage Notes (Signed)
Pt reports upper back pain in between her shoulder blades since wed, pt seen here 2 days ago and LWBS. Pt has hx of MI  2 years ago, called cardiologist office and they told her to come here. Pt a.o

## 2019-09-07 NOTE — ED Notes (Signed)
Called lab to add on D dimer

## 2019-09-07 NOTE — Discharge Instructions (Addendum)
Your labwork was reassuring today. The cardiologist office will call you to schedule an appointment for next week for further evaluation. Your pain may be musculoskeletal in nature; I would recommend Ibuprofen and Tylenol as needed for pain.   Please increase the potassium in your diet and have your potassium level rechecked in 1-2 weeks. It was only very minimally decreased today.   Your xray did show a lung nodule that was seen on a previous CT scan in 2020. It is recommended that you have a repeat CT scan in the outpatient setting for further evaluation. Please follow up with your PCP regarding this as they can schedule the CT scan for you.   Return to the ED IMMEDIATELY for any worsening symptoms including worsening back pain, pain in the front of your chest, worsening shortness of breath, feeling like you could pass out, or any other concerning symptoms

## 2019-09-07 NOTE — Telephone Encounter (Signed)
The patient reports recurring chest and back pain.  She went to the ER 4/7 via EMS and left because she waited too long. Her troponin upon arrival was 10. The next was 25. She sent a MyChart message to [facility] yesterday requesting an appointment. She was called several times to be instructed to return to the hospital but she was unable to be reached. Given recent elevated troponin and recurring CP, instructed her to return to the ED immediately and to have someone drive her there. She agrees with plan.

## 2019-09-07 NOTE — ED Notes (Signed)
Patient verbalizes understanding of discharge instructions. Opportunity for questioning and answers were provided. Armband removed by staff, pt discharged from ED.  

## 2019-09-07 NOTE — ED Provider Notes (Signed)
[facility] EMERGENCY DEPARTMENT Provider Note   CSN: [id number] Arrival date & time: 09/07/19  1300     History Chief Complaint  Patient presents with  . Back Pain    Monique Hunt is a 59 y.o. female with PMHx CAD s/p stent, HTN, HLD, who presents to the ED today complaining of gradual onset, constant, nagging, 6/10, right upper back pain x 2 days. Pt also complains of nausea and shortness of breath. She mentions she has also been having diarrhea. Pt reports similar symptoms when she had her MI in the past. She came to the ED via EMS 2 days ago and had labwork done however LWBS. Per chart review initial troponin of 10 and repeat of 25. Pt called her cardiologist office today and was recommended to come to the ED for further evaluation. She mentions having taken 1 NTG 2 days ago without relief. She also took Aleve without relief. Pt denies any hx of DVT/PE. No recent prolonged travel or immobilization. No active malignancy. No hemoptysis. No exogenous hormone use. She denies abdominal pain with her diarrhea.    Back Pain Associated symptoms: chest pain   Associated symptoms: no abdominal pain and no fever        Past Medical History:  Diagnosis Date  . Coronary artery disease   . Depression   . History of kidney stones   . Hyperlipidemia   . Hypertension   . Migraine    "stopped ~ 2010" (12/15/2017)  . NSTEMI (non-ST elevated myocardial infarction) ([place]) 12/15/2017    Patient Active Problem List   Diagnosis Date Noted  . Low TSH level 08/04/2018  . Chronic diarrhea 06/05/2018  . Left thyroid nodule 04/07/2018  . Lung nodule, solitary 04/07/2018  . CAD (coronary artery disease), native coronary artery 12/19/2017  . Hyperlipidemia LDL goal <70   . NSTEMI (non-ST elevated myocardial infarction) ([place]) 12/15/2017  . Hypertension 12/01/2017  . Depression 06/04/2012  . Nephrolithiasis 09/27/2011    Past Surgical History:  Procedure Laterality Date  .  CORONARY STENT INTERVENTION N/A 12/19/2017   Procedure: CORONARY STENT INTERVENTION;  Surgeon: Belva Crome, MD;  Location: [facility];  Service: Cardiovascular;  Laterality: N/A;  . CYSTOSCOPY W/ STONE MANIPULATION    . FACIAL FRACTURE SURGERY  1979  . LEFT HEART CATH AND CORONARY ANGIOGRAPHY N/A 12/16/2017   Procedure: LEFT HEART CATH AND CORONARY ANGIOGRAPHY;  Surgeon: Belva Crome, MD;  Location: [facility];  Service: Cardiovascular;  Laterality: N/A;  . LITHOTRIPSY    . SKIN SURGERY Right    "birthmark removed under my arm when I was little"     OB History   No obstetric history on file.     Family History  Problem Relation Age of Onset  . CAD Father   . Heart disease Father   . Breast cancer Mother   . Hyperlipidemia Sister   . Hyperlipidemia Brother   . Colon cancer Neg Hx   . Esophageal cancer Neg Hx   . Rectal cancer Neg Hx   . Stomach cancer Neg Hx     Social History   Tobacco Use  . Smoking status: Never Smoker  . Smokeless tobacco: Never Used  Substance Use Topics  . Alcohol use: Yes    Comment: 12/15/2017 "couple mixed drinks/month"  . Drug use: Never    Home Medications Prior to Admission medications   Medication Sig Start Date End Date Taking? Authorizing Provider  amLODipine (  NORVASC) 10 MG tablet Take 1 tablet (10 mg total) by mouth daily. 03/29/19   Ladell Pier, MD  aspirin 81 MG chewable tablet Chew 1 tablet (81 mg total) by mouth daily. 12/21/17   Lyda Jester M, PA-C  atorvastatin (LIPITOR) 80 MG tablet TAKE 1 TABLET (80 MG TOTAL) BY MOUTH DAILY AT 6 PM. 06/06/19   Ladell Pier, MD  clopidogrel (PLAVIX) 75 MG tablet Take 1 tablet (75 mg total) by mouth daily. 03/05/19   Ladell Pier, MD  dicyclomine (BENTYL) 10 MG capsule Take 2 capsules (20 mg total) by mouth every 8 (eight) hours as needed for spasms. For abdominal cramps and diarrhea 05/02/19   Doran Stabler, MD  esomeprazole (NEXIUM) 40 MG capsule Take  40 mg by mouth daily.    [provider]  ezetimibe (ZETIA) 10 MG tablet Take 1 tablet (10 mg total) by mouth daily. 06/21/19   Fay Records, MD  fenofibrate 160 MG tablet TAKE 1 TABLET BY MOUTH DAILY. 08/03/19   Fay Records, MD  hydrochlorothiazide (MICROZIDE) 12.5 MG capsule TAKE 1 CAPSULE BY MOUTH DAILY. 07/25/19   Ladell Pier, MD  losartan (COZAAR) 100 MG tablet Take 1 tablet (100 mg total) by mouth daily. 03/05/19   Ladell Pier, MD  metoprolol succinate (TOPROL-XL) 25 MG 24 hr tablet TAKE 1 TABLET BY MOUTH DAILY. 08/27/19   Fay Records, MD  nitroGLYCERIN (NITROSTAT) 0.4 MG SL tablet Place 1 tablet (0.4 mg total) under the tongue every 5 (five) minutes x 3 doses as needed for chest pain. 02/01/19   Ladell Pier, MD  venlafaxine XR (EFFEXOR-XR) 37.5 MG 24 hr capsule TAKE 1 CAPSULE (37.5 MG TOTAL) BY MOUTH 2 (TWO) TIMES DAILY. 04/20/19   Ladell Pier, MD    Allergies    Patient has no known allergies.  Review of Systems   Review of Systems  Constitutional: Negative for chills and fever.  Respiratory: Positive for shortness of breath. Negative for cough.   Cardiovascular: Positive for chest pain.  Gastrointestinal: Positive for diarrhea and nausea. Negative for abdominal pain.  Musculoskeletal: Positive for back pain.  All other systems reviewed and are negative.   Physical Exam Updated Vital Signs BP 121/69   Pulse 82   Temp 97.9 F (36.6 C) (Oral)   Resp 18   LMP 02/28/2011   SpO2 96%   Physical Exam Vitals and nursing note reviewed.  Constitutional:      Appearance: She is not ill-appearing or diaphoretic.     Comments: Sitting comfortably in bed on phone  HENT:     Head: Normocephalic and atraumatic.  Eyes:     Conjunctiva/sclera: Conjunctivae normal.  Cardiovascular:     Rate and Rhythm: Normal rate and regular rhythm.     Pulses: Normal pulses.  Pulmonary:     Effort: Pulmonary effort is normal.     Breath sounds: Normal breath  sounds. No wheezing, rhonchi or rales.  Chest:     Chest wall: No tenderness.  Abdominal:     Palpations: Abdomen is soft.     Tenderness: There is no abdominal tenderness. There is no right CVA tenderness, left CVA tenderness, guarding or rebound.  Musculoskeletal:     Cervical back: Neck supple.  Skin:    General: Skin is warm and dry.  Neurological:     Mental Status: She is alert.     ED Results / Procedures / Treatments   Labs (all  labs ordered are listed, but only abnormal results are displayed) Labs Reviewed  BASIC METABOLIC PANEL - Abnormal; Notable for the following components:      Result Value   Potassium 3.4 (*)    Glucose, Bld 112 (*)    BUN 32 (*)    Creatinine, Ser 1.05 (*)    GFR calc non Af Amer 58 (*)    All other components within normal limits  CBC - Abnormal; Notable for the following components:   Platelets 455 (*)    All other components within normal limits  D-DIMER, QUANTITATIVE (NOT AT [facility])  I-STAT BETA HCG BLOOD, ED ([facility], [facility], [facility] ONLY)  TROPONIN I (HIGH SENSITIVITY)  TROPONIN I (HIGH SENSITIVITY)    EKG EKG Interpretation  Date/Time:  Friday September 07 2019 13:45:21 EDT Ventricular Rate:  86 PR Interval:  150 QRS Duration: 72 QT Interval:  362 QTC Calculation: 433 R Axis:   -12 Text Interpretation: Normal sinus rhythm Minimal voltage criteria for LVH, may be normal variant ( R in aVL ) Anterior infarct , age undetermined Abnormal ECG no acute changes from older tracings Confirmed by Charlesetta Shanks [phone]) on 09/07/2019 6:36:00 PM   Radiology No results found.  Procedures Procedures (including critical care time)  Medications Ordered in ED Medications  aspirin chewable tablet 162 mg (162 mg Oral Given 09/07/19 1851)    ED Course  I have reviewed the triage vital signs and the nursing notes.  Pertinent labs & imaging results that were available during my care of the patient were reviewed by me and considered in my medical decision  making (see chart for details).  Clinical Course as of Sep 07 2007  Fri Sep 07, 2019  1902 Discussed case with Dr. Harl Bowie with cardiology; agrees that pt's pain sounds atypical. Agrees with D dimer. If all negative pt can be discharged home with close outpatient cards follow up   [MV]  1939 IMPRESSION: No evidence of acute cardiopulmonary disease.  Stable nodularity in the right mid lung. This was better evaluated on prior 2020 CT. Please note that patient is due for follow-up CT chest of that finding.   [MV]  1956 D-Dimer, Quant: 0.39 [MV]    Clinical Course User Index [MV] Eustaquio Maize, PA-C   MDM Rules/Calculators/A&P                      59 year old female who presents to the ED today complaining of persistent nagging right upper back pain that started 2 days ago, similar symptoms to patient's previous MI.  Currently on Plavix status post stent.  Came to the ED 2 days ago but left prior to being seen due to wait time.  Initial troponin of 10 and repeat of 25.  Returns today per cardiology recommendations.  On arrival to the ED patient is afebrile, nontachycardic and nontachypneic.  She was in the waiting room for several hours before being seen.  During my exam patient appears to be in no acute distress.  She rates her pain 6 out of 10.  She is sitting comfortably upright in bed on her phone.  She denies any anterior chest pain.  She does not appear diaphoretic.  Her pain is worsened with deep inspiration as well as movement.  She does not have any other risk factors for PE however given age cannot PERC out.  Will obtain D-dimer at this time.  Lab work was obtained prior to being seen. Pt does mention diarrhea however  no abdominal tenderness on my exam; do not feel she needs any imaging of her abdomen at this time. Have offered COVID testing however pt declined.   EKG unchanged from baseline. X-ray unremarkable. Does show stable right lung nodule that was seen on previous CT in 2020;  pt due for a follow up CT. Will have her do this outpatient.  CBC without leukocytosis.  Hemoglobin stable.  BMP with potassium 3.4.  Creatinine 1.05 which appears improving from 2 days ago however still not quite at patient's baseline.  BUN 32.  Lab Results  Component Value Date   CREATININE 1.05 (H) 09/07/2019   CREATININE 1.24 (H) 09/05/2019   CREATININE 0.95 06/18/2019   Initial troponin of 6. Will repeat.   Repeat trop of 5.  Awaiting dimer at this time. Cardiology Dr. Harl Bowie has reviewed case; does not think pt needs admission at this time. If dimer negative pt to be discharged home with close outpatient follow up which they will take care of.   D dimer 0.39. Do not feel pt needs CTA given low well's risk. Pt to be discharged home at this time with close outpatient cardiology follow up. She is advised to discuss CT scan outpatient with her PCP. Strict return precautions have been discussed with pt. She is in agreement with plan and stable for discharge home.   This note was prepared using Dragon voice recognition software and may include unintentional dictation errors due to the inherent limitations of voice recognition software.  Final Clinical Impression(s) / ED Diagnoses Final diagnoses:  Acute right-sided thoracic back pain  Shortness of breath  Hypokalemia    Rx / DC Orders ED Discharge Orders    None       Discharge Instructions     Your labwork was reassuring today. The cardiologist office will call you to schedule an appointment for next week for further evaluation. Your pain may be musculoskeletal in nature; I would recommend Ibuprofen and Tylenol as needed for pain.   Please increase the potassium in your diet and have your potassium level rechecked in 1-2 weeks. It was only very minimally decreased today.   Your xray did show a lung nodule that was seen on a previous CT scan in 2020. It is recommended that you have a repeat CT scan in the outpatient setting for  further evaluation. Please follow up with your PCP regarding this as they can schedule the CT scan for you.   Return to the ED IMMEDIATELY for any worsening symptoms including worsening back pain, pain in the front of your chest, worsening shortness of breath, feeling like you could pass out, or any other concerning symptoms       Eustaquio Maize, PA-C 09/07/19 2010    Charlesetta Shanks, MD 09/17/19 [phone]

## 2019-09-07 NOTE — Telephone Encounter (Signed)
Pt c/o of Chest Pain: STAT if CP now or developed within 24 hours  1. Are you having CP right now?  Pain her upper back around her shoulder blade  2. Are you experiencing any other symptoms (ex. SOB, nausea, vomiting, sweating)?  Shortness of breath,nauseated  3. How long have you been experiencing CP? Since Wednesday  4. Is your CP continuous or coming and going? staying  5. Have you taken Nitroglycerin? Earlier this week, but not today ?

## 2019-09-10 ENCOUNTER — Other Ambulatory Visit: Payer: Self-pay | Admitting: Cardiology

## 2019-09-10 ENCOUNTER — Other Ambulatory Visit: Payer: Self-pay | Admitting: Internal Medicine

## 2019-09-10 MED FILL — DICYCLOMINE 10 MG CAPSULE: 10 | 7 days supply | Qty: 45 | Fill #1

## 2019-09-12 ENCOUNTER — Other Ambulatory Visit: Payer: Self-pay | Admitting: Internal Medicine

## 2019-09-12 MED FILL — CLOPIDOGREL 75 MG TABLET: 75 | 30 days supply | Qty: 30 | Fill #0

## 2019-09-12 MED FILL — LOSARTAN POTASSIUM 100 MG T: 100 | 30 days supply | Qty: 30 | Fill #0

## 2019-09-13 NOTE — Progress Notes (Deleted)
{Choose 1 Note Type (Video or Telephone):[phone]}   The patient was identified using 2 identifiers.  Date:  09/13/2019   ID:  Monique Hunt, DOB 1960-08-03, MRN [id number]  {Patient Location:[id number]::"Home"} {Provider Location:[phone]::"Home"}  PCP:  Ladell Pier, MD  Cardiologist:  Dorris Carnes, MD   Evaluation Performed:  {Choose Visit [id number] Visit"}  Chief Complaint:  Chest pain   History of Present Illness:    Monique Hunt is a 59 y.o. female with ***  The patient {does/does not:200015} have symptoms concerning for COVID-19 infection (fever, chills, cough, or new shortness of breath).    Past Medical History:  Diagnosis Date  . Coronary artery disease   . Depression   . History of kidney stones   . Hyperlipidemia   . Hypertension   . Migraine    "stopped ~ 2010" (12/15/2017)  . NSTEMI (non-ST elevated myocardial infarction) ([place]) 12/15/2017   Past Surgical History:  Procedure Laterality Date  . CORONARY STENT INTERVENTION N/A 12/19/2017   Procedure: CORONARY STENT INTERVENTION;  Surgeon: Belva Crome, MD;  Location: [facility];  Service: Cardiovascular;  Laterality: N/A;  . CYSTOSCOPY W/ STONE MANIPULATION    . FACIAL FRACTURE SURGERY  1979  . LEFT HEART CATH AND CORONARY ANGIOGRAPHY N/A 12/16/2017   Procedure: LEFT HEART CATH AND CORONARY ANGIOGRAPHY;  Surgeon: Belva Crome, MD;  Location: [facility];  Service: Cardiovascular;  Laterality: N/A;  . LITHOTRIPSY    . SKIN SURGERY Right    "birthmark removed under my arm when I was little"     No outpatient medications have been marked as taking for the 09/14/19 encounter (Appointment) with Leanor Kail, [place].     Allergies:   Patient has no known allergies.   Social History   Tobacco Use  . Smoking status: Never Smoker  . Smokeless tobacco: Never Used  Substance Use Topics  . Alcohol use: Yes    Comment: 12/15/2017 "couple mixed drinks/month"   . Drug use: Never     Family Hx: The patient's family history includes Breast cancer in her mother; CAD in her father; Heart disease in her father; Hyperlipidemia in her brother and sister. There is no history of Colon cancer, Esophageal cancer, Rectal cancer, or Stomach cancer.  ROS:   Please see the history of present illness.    *** All other systems reviewed and are negative.   Prior CV studies:   The following studies were reviewed today:  ***  Labs/Other Tests and Data Reviewed:    EKG:  {EKG/Telemetry Strips Reviewed:[phone]}  Recent Labs: 03/30/2019: ALT 31; TSH 0.630 09/07/2019: BUN 32; Creatinine, Ser 1.05; Hemoglobin 12.6; Platelets 455; Potassium 3.4; Sodium 140   Recent Lipid Panel Lab Results  Component Value Date/Time   CHOL 200 (H) 06/18/2019 08:36 AM   TRIG 104 06/18/2019 08:36 AM   HDL 67 06/18/2019 08:36 AM   CHOLHDL 3.0 06/18/2019 08:36 AM   CHOLHDL 7.2 12/16/2017 07:14 AM   LDLCALC 115 (H) 06/18/2019 08:36 AM    Wt Readings from Last 3 Encounters:  06/18/19 149 lb 12.8 oz (67.9 kg)  05/16/19 149 lb (67.6 kg)  05/04/19 150 lb 12.8 oz (68.4 kg)     Objective:    Vital Signs:  LMP 02/28/2011    {[facility] Virtual Exam (Optional):[phone]::"VITAL SIGNS:  reviewed"}  ASSESSMENT & PLAN:    1. ***  COVID-19 Education: The signs and symptoms of COVID-19 were discussed with the patient and how to seek care  for testing (follow up with PCP or arrange E-visit).  ***The importance of social distancing was discussed today.  Time:   Today, I have spent *** minutes with the patient with telehealth technology discussing the above problems.     Medication Adjustments/Labs and Tests Ordered: Current medicines are reviewed at length with the patient today.  Concerns regarding medicines are outlined above.   Tests Ordered: No orders of the defined types were placed in this encounter.   Medication Changes: No orders of the defined types were  placed in this encounter.   Follow Up:  {F/U Format:[phone]} {follow up:15908}  Jarrett Soho, PA  09/13/2019 1:58 PM    [facility]

## 2019-09-14 ENCOUNTER — Telehealth: Payer: Self-pay | Admitting: Physician Assistant

## 2019-09-14 ENCOUNTER — Other Ambulatory Visit: Payer: Self-pay

## 2019-09-14 ENCOUNTER — Ambulatory Visit
Admission: RE | Admit: 2019-09-14 | Discharge: 2019-09-14 | Disposition: A | Discharge status: Home / Self Care | Payer: No Typology Code available for payment source | Source: Ambulatory Visit | Attending: Internal Medicine | Admitting: Internal Medicine

## 2019-09-14 DIAGNOSIS — Z1231 Encounter for screening mammogram for malignant neoplasm of breast: Secondary | ICD-10-CM

## 2019-09-19 MED FILL — ?METOPROLOL SUCC ER 25MG TA: 25 | 30 days supply | Qty: 30 | Fill #1

## 2019-09-19 MED FILL — ATORVASTATIN 80 MG TABLET: 80 | 30 days supply | Qty: 30 | Fill #1 | Status: TO

## 2019-09-28 MED FILL — AMLODIPINE BESYLATE 10 MG T: 10 | 30 days supply | Qty: 30 | Fill #6

## 2019-09-28 MED FILL — HYDROCHLOROTHIAZIDE 12.5 MG: 12.5 | 30 days supply | Qty: 30 | Fill #2

## 2019-09-28 MED FILL — FENOFIBRATE 160 MG TABLET: 160 | 30 days supply | Qty: 30 | Fill #2

## 2019-10-08 ENCOUNTER — Other Ambulatory Visit: Payer: Self-pay | Admitting: Internal Medicine

## 2019-10-08 DIAGNOSIS — F32A Depression, unspecified: Secondary | ICD-10-CM

## 2019-10-08 DIAGNOSIS — F329 Major depressive disorder, single episode, unspecified: Secondary | ICD-10-CM

## 2019-10-17 MED FILL — ?METOPROLOL SUCC ER 25MG TA: 25 | 30 days supply | Qty: 30 | Fill #2

## 2019-10-17 MED FILL — ATORVASTATIN 80 MG TABLET: 80 | 30 days supply | Qty: 30 | Fill #2 | Status: TO

## 2019-10-22 MED FILL — FENOFIBRATE 160 MG TABLET: 160 | 30 days supply | Qty: 30 | Fill #3

## 2019-10-22 MED FILL — ?AMLODIPINE BESYL 10MG TABL: 10 | 30 days supply | Qty: 30 | Fill #7

## 2019-10-23 MED FILL — HYDROCHLOROTHIAZIDE 12.5 MG: 12.5 | 30 days supply | Qty: 30 | Fill #0

## 2019-11-01 ENCOUNTER — Other Ambulatory Visit: Payer: Self-pay

## 2019-11-01 ENCOUNTER — Encounter: Payer: Self-pay | Admitting: Internal Medicine

## 2019-11-01 ENCOUNTER — Ambulatory Visit: Payer: Self-pay | Attending: Internal Medicine | Admitting: Internal Medicine

## 2019-11-01 VITALS — BP 139/84 | HR 77 | Temp 97.9°F | Resp 16 | Wt 150.4 lb

## 2019-11-01 DIAGNOSIS — I10 Essential (primary) hypertension: Secondary | ICD-10-CM

## 2019-11-01 DIAGNOSIS — E876 Hypokalemia: Secondary | ICD-10-CM

## 2019-11-01 DIAGNOSIS — F3342 Major depressive disorder, recurrent, in full remission: Secondary | ICD-10-CM

## 2019-11-01 DIAGNOSIS — R911 Solitary pulmonary nodule: Secondary | ICD-10-CM

## 2019-11-01 DIAGNOSIS — I25118 Atherosclerotic heart disease of native coronary artery with other forms of angina pectoris: Secondary | ICD-10-CM

## 2019-11-01 MED ORDER — HYDROCHLOROTHIAZIDE 12.5 MG PO CAPS: 12.5000 mg | ORAL_CAPSULE | Freq: Every day | ORAL | 6 refills | Status: DC

## 2019-11-01 MED ORDER — EZETIMIBE 10 MG PO TABS: 10.0000 mg | ORAL_TABLET | Freq: Every day | ORAL | 3 refills | Status: DC

## 2019-11-01 MED ORDER — ATORVASTATIN CALCIUM 80 MG PO TABS: ORAL_TABLET | ORAL | 6 refills | Status: DC

## 2019-11-01 MED ORDER — VENLAFAXINE HCL ER 37.5 MG PO CP24: 37.5000 mg | ORAL_CAPSULE | Freq: Every day | ORAL | 6 refills | Status: DC

## 2019-11-01 MED FILL — LOSARTAN POTASSIUM 100 MG T: 100 | 30 days supply | Qty: 30 | Fill #2

## 2019-11-01 MED FILL — ?EZETIMIBE 10 MG TABLET: 10 | 30 days supply | Qty: 30 | Fill #0

## 2019-11-01 MED FILL — ?CLOPIDOGREL 75MG TA: 75 | 30 days supply | Qty: 30 | Fill #2

## 2019-11-01 NOTE — Patient Instructions (Signed)
Your blood pressure is not at goal. Please cut back on salt intake and caffeine intake. Check your blood pressure at home at least twice a week. The goal is 130/80 or lower. In about 2 weeks please send me the results of your last 2-3 home blood pressure readings via MyChart.

## 2019-11-01 NOTE — Progress Notes (Signed)
Patient ID: Monique Hunt, female    DOB: 07/26/1960  MRN: [id number]  CC: Hypertension   Subjective: Monique Hunt is a 59 y.o. female who presents for chronic ds management Her concerns today include:  Patient with history of HTN, CADone stent to LAD 11/2017, depression, HL, LT thyroid nodule (no further f/u needed per endocrinology Dr. Kelton Pillar), RT lung nodule  HYPERTENSION/CAD Currently taking: see medication list.  She has all of her meds with her today for me to go through and explained what each medicine is for.- Plavix, Lipitor, HCTZ, fenofibrate, Cozaar, metoprolol, amlodipine, hydrochlorothiazide. the only 1 she does not have is Zetia Med Adherence: [x]  Yes    []  No Medication side effects: []  Yes    []  No Adherence with salt restriction: []  Yes    [x]  No Home Monitoring?: []  Yes    [x]  No but does have a device Monitoring Frequency: []  Yes    []  No Home BP results range: []  Yes    []  No SOB? [x]  Yes  When she has CP Chest Pain?: [x]  Yes - with exertion and when she is busy at work. Last took SL Nitro several days ago   Leg swelling?: []  Yes    [x]  No Headaches?: []  Yes    [x]  No Dizziness? []  Yes    [x]  No Comments:  Palpitations sometimes  RT nodule:  Due for f/u CT imaging. Never smoke nicotine.  Denies any cough or hemoptysis.  Depression: Patient reports symptoms are well controlled with Effexor.  Patient Active Problem List   Diagnosis Date Noted   Low TSH level 08/04/2018   Chronic diarrhea 06/05/2018   Left thyroid nodule 04/07/2018   Lung nodule, solitary 04/07/2018   CAD (coronary artery disease), native coronary artery 12/19/2017   Hyperlipidemia LDL goal <70    NSTEMI (non-ST elevated myocardial infarction) ([place]) 12/15/2017   Hypertension 12/01/2017   Depression 06/04/2012   Nephrolithiasis 09/27/2011     Current Outpatient Medications on File Prior to Visit  Medication Sig Dispense Refill   amLODipine (NORVASC) 10 MG  tablet Take 1 tablet (10 mg total) by mouth daily. 90 tablet 3   aspirin 81 MG chewable tablet Chew 1 tablet (81 mg total) by mouth daily. 30 tablet 11   clopidogrel (PLAVIX) 75 MG tablet TAKE 1 TABLET BY MOUTH DAILY WITH BREAKFAST. 30 tablet 8   dicyclomine (BENTYL) 10 MG capsule Take 2 capsules (20 mg total) by mouth every 8 (eight) hours as needed for spasms. For abdominal cramps and diarrhea 45 capsule 1   esomeprazole (NEXIUM) 40 MG capsule Take 40 mg by mouth daily.     fenofibrate 160 MG tablet TAKE 1 TABLET BY MOUTH DAILY. 90 tablet 2   losartan (COZAAR) 100 MG tablet TAKE 1 TABLET BY MOUTH DAILY. 30 tablet 8   metoprolol succinate (TOPROL-XL) 25 MG 24 hr tablet TAKE 1 TABLET BY MOUTH DAILY. 90 tablet 3   nitroGLYCERIN (NITROSTAT) 0.4 MG SL tablet Place 1 tablet (0.4 mg total) under the tongue every 5 (five) minutes x 3 doses as needed for chest pain. 25 tablet 3   No current facility-administered medications on file prior to visit.    No Known Allergies  Social History   Socioeconomic History   Marital status: Divorced    Spouse name: Not on file   Number of children: 2   Years of education: Not on file   Highest education level: Not on file  Occupational History  Not on file  Tobacco Use   Smoking status: Never Smoker   Smokeless tobacco: Never Used  Substance and Sexual Activity   Alcohol use: Yes    Comment: 12/15/2017 "couple mixed drinks/month"   Drug use: Never   Sexual activity: Yes    Comment: menopausal  Other Topics Concern   Not on file  Social History Narrative   Not on file   Social Determinants of Health   Financial Resource Strain:    Difficulty of Paying Living Expenses:   Food Insecurity:    Worried About Charity fundraiser in the Last Year:    Arboriculturist in the Last Year:   Transportation Needs:    Film/video editor (Medical):    Lack of Transportation (Non-Medical):   Physical Activity:    Days of  Exercise per Week:    Minutes of Exercise per Session:   Stress:    Feeling of Stress :   Social Connections:    Frequency of Communication with Friends and Family:    Frequency of Social Gatherings with Friends and Family:    Attends Religious Services:    Active Member of Clubs or Organizations:    Attends Music therapist:    Marital Status:   Intimate Partner Violence:    Fear of Current or Ex-Partner:    Emotionally Abused:    Physically Abused:    Sexually Abused:     Family History  Problem Relation Age of Onset   CAD Father    Heart disease Father    Breast cancer Mother    Hyperlipidemia Sister    Hyperlipidemia Brother    Colon cancer Neg Hx    Esophageal cancer Neg Hx    Rectal cancer Neg Hx    Stomach cancer Neg Hx     Past Surgical History:  Procedure Laterality Date   CORONARY STENT INTERVENTION N/A 12/19/2017   Procedure: CORONARY STENT INTERVENTION;  Surgeon: Belva Crome, MD;  Location: [facility];  Service: Cardiovascular;  Laterality: N/A;   CYSTOSCOPY W/ STONE MANIPULATION     FACIAL FRACTURE SURGERY  1979   LEFT HEART CATH AND CORONARY ANGIOGRAPHY N/A 12/16/2017   Procedure: LEFT HEART CATH AND CORONARY ANGIOGRAPHY;  Surgeon: Belva Crome, MD;  Location: [facility];  Service: Cardiovascular;  Laterality: N/A;   LITHOTRIPSY     SKIN SURGERY Right    "birthmark removed under my arm when I was little"    ROS: Review of Systems Negative except as stated above  PHYSICAL EXAM: BP 139/84    Pulse 77    Temp 97.9 F (36.6 C)    Resp 16    Wt 150 lb 6.4 oz (68.2 kg)    LMP 02/28/2011    SpO2 97%    BMI 25.03 kg/m   Wt Readings from Last 3 Encounters:  11/01/19 150 lb 6.4 oz (68.2 kg)  06/18/19 149 lb 12.8 oz (67.9 kg)  05/16/19 149 lb (67.6 kg)    Physical Exam  General appearance - alert, well appearing, and in no distress Mental status - normal mood, behavior, speech, dress, motor  activity, and thought processes Neck - supple, no significant adenopathy Chest - clear to auscultation, no wheezes, rales or rhonchi, symmetric air entry Heart - normal rate, regular rhythm, normal S1, S2, no murmurs, rubs, clicks or gallops Extremities - peripheral pulses normal, no pedal edema, no clubbing or cyanosis  Depression screen [facility] 2/9  06/05/2018 05/10/2018 04/07/2018  Decreased Interest 0 0 0  Down, Depressed, Hopeless 0 0 0  PHQ - 2 Score 0 0 0    CMP Latest Ref Rng & Units 09/07/2019 09/05/2019 06/18/2019  Glucose 70 - 99 mg/dL 112(H) 104(H) 105(H)  BUN 6 - 20 mg/dL 32(H) 24(H) 29(H)  Creatinine 0.44 - 1.00 mg/dL 1.05(H) 1.24(H) 0.95  Sodium 135 - 145 mmol/L 140 138 138  Potassium 3.5 - 5.1 mmol/L 3.4(L) 3.2(L) 3.6  Chloride 98 - 111 mmol/L 102 101 98  CO2 22 - 32 mmol/L 26 19(L) 25  Calcium 8.9 - 10.3 mg/dL 10.0 9.6 9.9  Total Protein 6.0 - 8.5 g/dL - - -  Total Bilirubin 0.0 - 1.2 mg/dL - - -  Alkaline Phos 39 - 117 IU/L - - -  AST 0 - 40 IU/L - - -  ALT 0 - 32 IU/L - - -   Lipid Panel     Component Value Date/Time   CHOL 200 (H) 06/18/2019 0836   TRIG 104 06/18/2019 0836   HDL 67 06/18/2019 0836   CHOLHDL 3.0 06/18/2019 0836   CHOLHDL 7.2 12/16/2017 0714   VLDL 79 (H) 12/16/2017 0714   LDLCALC 115 (H) 06/18/2019 0836    CBC    Component Value Date/Time   WBC 5.5 09/07/2019 1352   RBC 4.13 09/07/2019 1352   HGB 12.6 09/07/2019 1352   HGB 11.5 06/18/2019 0836   HCT 38.9 09/07/2019 1352   HCT 35.2 06/18/2019 0836   PLT 455 (H) 09/07/2019 1352   PLT 497 (H) 06/18/2019 0836   MCV 94.2 09/07/2019 1352   MCV 93 06/18/2019 0836   MCH 30.5 09/07/2019 1352   MCHC 32.4 09/07/2019 1352   RDW 14.3 09/07/2019 1352   RDW 13.2 06/18/2019 0836   LYMPHSABS 1.3 05/10/2018 1218   MONOABS 0.6 02/10/2018 1741   EOSABS 0.1 05/10/2018 1218   BASOSABS 0.1 05/10/2018 1218    ASSESSMENT AND PLAN: 1. Essential hypertension Not at goal.  She reports compliance with  amlodipine, HCTZ, metoprolol and Cozaar.  She will check blood pressure at home twice a week for the next 2 weeks and then send me a MyChart message with her readings.  If she is still above goal we will increase the metoprolol or hydrochlorothiazide.  2. Coronary artery disease of native artery of native heart with stable angina pectoris (HCC) Continue current medications including metoprolol, Plavix and Lipitor.  Advised to report to her cardiologist or myself if she has increasing episodes of angina - atorvastatin (LIPITOR) 80 MG tablet; TAKE 1 TABLET (80 MG TOTAL) BY MOUTH DAILY AT 6 PM.  Dispense: 30 tablet; Refill: 6 - ezetimibe (ZETIA) 10 MG tablet; Take 1 tablet (10 mg total) by mouth daily.  Dispense: 90 tablet; Refill: 3  3. Recurrent major depressive disorder, in full remission ([place]) Well-controlled on Effexor - venlafaxine XR (EFFEXOR-XR) 37.5 MG 24 hr capsule; Take 1 capsule (37.5 mg total) by mouth daily with breakfast.  Dispense: 30 capsule; Refill: 6  4. Lung nodule, solitary - CT Chest W Contrast; Future  5. Hypokalemia - Basic Metabolic Panel   Patient was given the opportunity to ask questions.  Patient verbalized understanding of the plan and was able to repeat key elements of the plan.   Orders Placed This Encounter  Procedures   CT Chest W Contrast   Basic Metabolic Panel     Requested Prescriptions   Signed Prescriptions Disp Refills   atorvastatin (LIPITOR) 80  MG tablet 30 tablet 6    Sig: TAKE 1 TABLET (80 MG TOTAL) BY MOUTH DAILY AT 6 PM.   hydrochlorothiazide (MICROZIDE) 12.5 MG capsule 30 capsule 6    Sig: Take 1 capsule (12.5 mg total) by mouth daily.   venlafaxine XR (EFFEXOR-XR) 37.5 MG 24 hr capsule 30 capsule 6    Sig: Take 1 capsule (37.5 mg total) by mouth daily with breakfast.   ezetimibe (ZETIA) 10 MG tablet 90 tablet 3    Sig: Take 1 tablet (10 mg total) by mouth daily.    Return in about 4 months (around 03/02/2020).  Karle Plumber, MD, FACP

## 2019-11-02 ENCOUNTER — Other Ambulatory Visit: Payer: Self-pay | Admitting: Internal Medicine

## 2019-11-02 DIAGNOSIS — R739 Hyperglycemia, unspecified: Secondary | ICD-10-CM

## 2019-11-02 LAB — BASIC METABOLIC PANEL
BUN/Creatinine Ratio: 30 — ABNORMAL HIGH (ref 9–23)
BUN: 30 mg/dL — ABNORMAL HIGH (ref 6–24)
CO2: 23 mmol/L (ref 20–29)
Calcium: 10.5 mg/dL — ABNORMAL HIGH (ref 8.7–10.2)
Chloride: 99 mmol/L (ref 96–106)
Creatinine, Ser: 1 mg/dL (ref 0.57–1.00)
GFR calc Af Amer: 71 mL/min/{1.73_m2} (ref >59)
GFR calc non Af Amer: 62 mL/min/{1.73_m2} (ref >59)
Glucose: 100 mg/dL — ABNORMAL HIGH (ref 65–99)
Potassium: 4.8 mmol/L (ref 3.5–5.2)
Sodium: 139 mmol/L (ref 134–144)

## 2019-11-05 ENCOUNTER — Other Ambulatory Visit: Payer: Self-pay

## 2019-11-05 DIAGNOSIS — R739 Hyperglycemia, unspecified: Secondary | ICD-10-CM

## 2019-11-06 LAB — PTH, INTACT AND CALCIUM
Calcium: 9.8 mg/dL (ref 8.7–10.2)
PTH: 30 pg/mL (ref 15–65)

## 2019-11-06 LAB — TSH: TSH: 0.622 u[IU]/mL (ref 0.450–4.500)

## 2019-11-06 LAB — HEMOGLOBIN A1C
Est. average glucose Bld gHb Est-mCnc: 114 mg/dL
Hgb A1c MFr Bld: 5.6 % (ref 4.8–5.6)

## 2019-11-08 ENCOUNTER — Ambulatory Visit (HOSPITAL_COMMUNITY): Payer: Self-pay

## 2019-11-15 ENCOUNTER — Encounter: Payer: Self-pay | Admitting: Internal Medicine

## 2019-11-15 ENCOUNTER — Other Ambulatory Visit: Payer: Self-pay | Admitting: Internal Medicine

## 2019-11-15 MED ORDER — HYDROCHLOROTHIAZIDE 25 MG PO TABS: 25.0000 mg | ORAL_TABLET | Freq: Every day | ORAL | 3 refills | Status: DC

## 2019-11-16 MED FILL — HYDROCHLOROTHIAZIDE 25 MG T: 25 | 30 days supply | Qty: 30 | Fill #0

## 2019-11-19 MED FILL — ?AMLODIPINE BESYL 10MG TABL: 10 | 30 days supply | Qty: 30 | Fill #8

## 2019-11-19 MED FILL — FENOFIBRATE 160 MG TABLET: 160 | 30 days supply | Qty: 30 | Fill #4

## 2019-11-19 MED FILL — ATORVASTATIN 80 MG TABLET: 80 | 30 days supply | Qty: 30 | Fill #1

## 2019-11-19 MED FILL — ?METOPROLOL SUCC ER 25MG TA: 25 | 30 days supply | Qty: 30 | Fill #3

## 2019-11-21 ENCOUNTER — Encounter: Payer: Self-pay | Admitting: Internal Medicine

## 2019-11-21 ENCOUNTER — Other Ambulatory Visit: Payer: Self-pay | Admitting: Internal Medicine

## 2019-11-21 DIAGNOSIS — F3342 Major depressive disorder, recurrent, in full remission: Secondary | ICD-10-CM

## 2019-11-21 MED ORDER — VENLAFAXINE HCL ER 75 MG PO CP24: 75.0000 mg | ORAL_CAPSULE | Freq: Every day | ORAL | 6 refills | Status: DC

## 2019-11-21 MED FILL — VENLAFAXINE HCL ER 75 MG CA: 75 | 30 days supply | Qty: 30 | Fill #0

## 2019-11-26 MED FILL — LOSARTAN POTASSIUM 100 MG T: 100 | 30 days supply | Qty: 30 | Fill #3

## 2019-11-26 MED FILL — ?EZETIMIBE 10 MG TABLET: 10 | 30 days supply | Qty: 30 | Fill #1

## 2019-11-26 MED FILL — ?CLOPIDOGREL 75MG TA: 75 | 30 days supply | Qty: 30 | Fill #3

## 2019-12-10 MED FILL — HYDROCHLOROTHIAZIDE 25 MG T: 25 | 30 days supply | Qty: 30 | Fill #1

## 2019-12-17 ENCOUNTER — Ambulatory Visit: Payer: Self-pay

## 2019-12-17 MED FILL — ATORVASTATIN 80 MG TABLET: 80 | 30 days supply | Qty: 30 | Fill #2

## 2019-12-17 MED FILL — FENOFIBRATE 160 MG TABLET: 160 | 30 days supply | Qty: 30 | Fill #5

## 2019-12-17 MED FILL — VENLAFAXINE HCL ER 75 MG CA: 75 | 30 days supply | Qty: 30 | Fill #1

## 2019-12-17 MED FILL — ?AMLODIPINE BESYL 10MG TABL: 10 | 30 days supply | Qty: 30 | Fill #9

## 2019-12-17 MED FILL — ?METOPROLOL SUCC ER 25MG TA: 25 | 30 days supply | Qty: 30 | Fill #4

## 2019-12-17 NOTE — Telephone Encounter (Signed)
Patient called She is at work and having moderate chest pain.  She has already taken 2 nitroglycerin. She is out of breath. She is able to speak brief phrases. Triage stopped to call 911. Patient refused stating that she has someone who will drive her. Patient was urged to go via EMS but refused.  Reason for Disposition . [1] Chest pain lasts > 5 minutes AND [2] age > 35    Patient refused 911. At work and has other to drive her.  Answer Assessment - Initial Assessment Questions 1. LOCATION: "Where does it hurt?"       chest 2. RADIATION: "Does the pain go anywhere else?" (e.g., into neck, jaw, arms, back)    To the back 3. ONSET: "When did the chest pain begin?" (Minutes, hours or days)      Every morning 4. PATTERN "Does the pain come and go, or has it been constant since it started?"  "Does it get worse with exertion?"     constant 5. DURATION: "How long does it last" (e.g., seconds, minutes, hours)     *No Answer* 6. SEVERITY: "How bad is the pain?"  (e.g., Scale 1-10; mild, moderate, or severe)    - MILD (1-3): doesn't interfere with normal activities     - MODERATE (4-7): interferes with normal activities or awakens from sleep    - SEVERE (8-10): excruciating pain, unable to do any normal activities       Can't breath  7. CARDIAC RISK FACTORS: "Do you have any history of heart problems or risk factors for heart disease?" (e.g., angina, prior heart attack; diabetes, high blood pressure, high cholesterol, smoker, or strong family history of heart disease)     Yes MI 8. PULMONARY RISK FACTORS: "Do you have any history of lung disease?"  (e.g., blood clots in lung, asthma, emphysema, birth control pills)      9. CAUSE: "What do you think is causing the chest pain?"     10. OTHER SYMPTOMS: "Do you have any other symptoms?" (e.g., dizziness, nausea, vomiting, sweating, fever, difficulty breathing, cough)      nausea 11. PREGNANCY: "Is there any chance you are pregnant?" "When was your  last menstrual period?"  Protocols used: CHEST PAIN-A-AH

## 2019-12-17 NOTE — Telephone Encounter (Signed)
Made pt aware of MD message, stressed the importance to be seen in the ED or further evaluation/ stated she is better now and doesn't think she needs to go to the ED. Verbalized that she rather see her cardiologist tomorrow morning/ Message routed to provider

## 2019-12-17 NOTE — Progress Notes (Addendum)
Cardiology Office Note:    Date:  12/18/2019   ID:  Monique Hunt, DOB 12-Dec-1960, MRN [id number]  PCP:  Monique Pier, MD  Cardiologist:  Monique Carnes, MD  Electrophysiologist:  None   Referring MD: Monique Pier, MD   Chief Complaint:  Chest Pain    Patient Profile:    Monique Hunt is a 59 y.o. female with:   Coronary artery disease  S/p NSTEMI 7/19 >> PCI: DES to pLAD  ETT-Echocardiogram 07/2018: no ischemia  Hypertension   Hyperlipidemia    Prior CV studies: Stress echocardiogram 07/10/2018 No echocardiographic evidence for stress-induced ischemia No LV outflow tract obstruction at baseline or after exercise  Cardiac catheterization 12/16/2017 LAD proximal 75 EF 55-65 12/19/2017: PCI -3.5 x 12 mm Synergy DES to the LAD  History of Present Illness:    Monique Hunt was last seen by Monique Hunt in 06/2019.  She called in recently with symptoms of chest pain.  She is seen for further evaluation.   She is here alone.  She notes progressively worsening exertional chest discomfort over the past 3 to 4 months.  She developed symptoms about 3-4 times a week.  She is currently getting anginal symptoms with just minimal activity.  She has associated radiation to her back, shortness of breath as well as nausea and diaphoresis.  She takes nitroglycerin with relief.  Her symptoms remind her of her previous myocardial infarction.  However, her symptoms are not as bad.  She has not had orthopnea, PND, lower extremity swelling or syncope.  She has been dizzy at times.  Past Medical History:  Diagnosis Date   Coronary artery disease    Depression    History of kidney stones    Hyperlipidemia    Hypertension    Migraine    "stopped ~ 2010" (12/15/2017)   NSTEMI (non-ST elevated myocardial infarction) ([place]) 12/15/2017    Current Medications: Current Meds  Medication Sig   amLODipine (NORVASC) 10 MG tablet Take 1 tablet (10 mg total) by mouth daily.    aspirin 81 MG chewable tablet Chew 1 tablet (81 mg total) by mouth daily.   atorvastatin (LIPITOR) 80 MG tablet TAKE 1 TABLET (80 MG TOTAL) BY MOUTH DAILY AT 6 PM.   clopidogrel (PLAVIX) 75 MG tablet TAKE 1 TABLET BY MOUTH DAILY WITH BREAKFAST.   dicyclomine (BENTYL) 10 MG capsule Take 2 capsules (20 mg total) by mouth every 8 (eight) hours as needed for spasms. For abdominal cramps and diarrhea   esomeprazole (NEXIUM) 40 MG capsule Take 40 mg by mouth daily.   ezetimibe (ZETIA) 10 MG tablet Take 1 tablet (10 mg total) by mouth daily.   fenofibrate 160 MG tablet TAKE 1 TABLET BY MOUTH DAILY.   hydrochlorothiazide (HYDRODIURIL) 25 MG tablet Take 1 tablet (25 mg total) by mouth daily.   losartan (COZAAR) 100 MG tablet TAKE 1 TABLET BY MOUTH DAILY.   metoprolol succinate (TOPROL-XL) 50 MG 24 hr tablet Take 1 tablet (50 mg total) by mouth daily.   nitroGLYCERIN (NITROSTAT) 0.4 MG SL tablet Place 1 tablet (0.4 mg total) under the tongue every 5 (five) minutes x 3 doses as needed for chest pain.   venlafaxine XR (EFFEXOR-XR) 75 MG 24 hr capsule Take 1 capsule (75 mg total) by mouth daily with breakfast.   [DISCONTINUED] metoprolol succinate (TOPROL-XL) 25 MG 24 hr tablet TAKE 1 TABLET BY MOUTH DAILY.   [DISCONTINUED] nitroGLYCERIN (NITROSTAT) 0.4 MG SL tablet Place 1 tablet (0.4 mg  total) under the tongue every 5 (five) minutes x 3 doses as needed for chest pain.     Allergies:   Patient has no known allergies.   Social History   Tobacco Use   Smoking status: Never Smoker   Smokeless tobacco: Never Used  Vaping Use   Vaping Use: Never used  Substance Use Topics   Alcohol use: Yes    Comment: 12/15/2017 "couple mixed drinks/month"   Drug use: Never     Family Hx: The patient's family history includes Breast cancer in her mother; CAD in her father; Heart disease in her father; Hyperlipidemia in her brother and sister. There is no history of Colon cancer, Esophageal cancer,  Rectal cancer, or Stomach cancer.  Review of Systems  Constitutional: Negative for chills and fever.  Respiratory: Negative for cough.   Gastrointestinal: Negative for hematochezia and melena.  Genitourinary: Negative for hematuria.  All other systems reviewed and are negative.    EKGs/Labs/Other Test Reviewed:    EKG:  EKG is   ordered today.  The ekg ordered today demonstrates normal sinus rhythm, heart rate 74, left axis deviation, nonspecific ST-T wave changes, QTC 448, no change since prior tracing  Recent Labs: 03/30/2019: ALT 31 09/07/2019: Hemoglobin 12.6; Platelets 455 11/01/2019: BUN 30; Creatinine, Ser 1.00; Potassium 4.8; Sodium 139 11/05/2019: TSH 0.622   Recent Lipid Panel Lab Results  Component Value Date/Time   CHOL 200 (H) 06/18/2019 08:36 AM   TRIG 104 06/18/2019 08:36 AM   HDL 67 06/18/2019 08:36 AM   CHOLHDL 3.0 06/18/2019 08:36 AM   CHOLHDL 7.2 12/16/2017 07:14 AM   LDLCALC 115 (H) 06/18/2019 08:36 AM    Physical Exam:    VS:  BP 118/72    Pulse 74    Ht 5\' 5"  (1.651 m)    Wt 149 lb (67.6 kg)    LMP 02/28/2011    SpO2 100%    BMI 24.79 kg/m     Wt Readings from Last 3 Encounters:  12/18/19 149 lb (67.6 kg)  11/01/19 150 lb 6.4 oz (68.2 kg)  06/18/19 149 lb 12.8 oz (67.9 kg)     Constitutional:      Appearance: Healthy appearance. Not in distress.  Neck:     Thyroid: No thyromegaly.     Vascular: JVD normal.  Pulmonary:     Effort: Pulmonary effort is normal.     Breath sounds: No wheezing. No rales.  Cardiovascular:     Normal rate. Regular rhythm. Normal S1. Normal S2.     Murmurs: There is no murmur.  Edema:    Peripheral edema absent.  Abdominal:     Palpations: Abdomen is soft. There is no hepatomegaly.  Skin:    General: Skin is warm and dry.  Neurological:     Mental Status: Alert and oriented to person, place and time.     Cranial Nerves: Cranial nerves are intact.      ASSESSMENT & PLAN:    1. Coronary artery disease involving  native coronary artery of native heart with unstable angina pectoris [facility]) History of non-STEMI in July 2019 treated with a drug-eluting stent to the LAD.  She has recently developed progressively worsening anginal symptoms.  She describes CCS class III angina.  She is currently on 2 separate antianginal drugs including amlodipine and metoprolol succinate.  I have recommended proceeding with cardiac catheterization.  I discussed her case today with Dr. Acie Fredrickson (attending MD) who agreed.  Risks and benefits of cardiac catheterization  have been discussed with the patient.  These include bleeding, infection, kidney damage, stroke, heart attack, death.  The patient understands these risks and is willing to proceed.   -Cardiac catheterization tomorrow 7/21 with Dr. Gwenlyn Found  -Continue aspirin, clopidogrel, atorvastatin, amlodipine, ezetimibe  -Increase metoprolol succinate to 50 mg daily  -She knows to go the emergency room if she has worsening symptoms today  2. Essential hypertension The patient's blood pressure is controlled on her current regimen.  Continue current therapy.   3. Hypercholesterolemia Recent LDL 115.  She was placed on ezetimibe in addition to atorvastatin.  We will need to arrange follow-up lipids and LFTs at her follow-up appointment.    Dispo:  Return in about 2 weeks (around 01/01/2020) for Post Procedure Follow Up, w/ Monique Hunt, or Richardson Dopp, PA-C, in person.   Medication Adjustments/Labs and Tests Ordered: Current medicines are reviewed at length with the patient today.  Concerns regarding medicines are outlined above.  Tests Ordered: Orders Placed This Encounter  Procedures   CBC   Basic metabolic panel   EKG [id number]   Medication Changes: Meds ordered this encounter  Medications   metoprolol succinate (TOPROL-XL) 50 MG 24 hr tablet    Sig: Take 1 tablet (50 mg total) by mouth daily.    Dispense:  90 tablet    Refill:  1   nitroGLYCERIN (NITROSTAT) 0.4 MG SL  tablet    Sig: Place 1 tablet (0.4 mg total) under the tongue every 5 (five) minutes x 3 doses as needed for chest pain.    Dispense:  30 tablet    Refill:  3    Signed, Richardson Dopp, PA-C  12/18/2019 9:21 AM    [address], [place], [place]  [address] Phone: [phone]; Fax: [phone]   Attending Note:   The patient was seen and examined.  Agree with assessment and plan as noted above.  Changes made to the above note as needed.  Patient seen and independently examined with  Richardson Dopp, PA .   We discussed all aspects of the encounter. I agree with the assessment and plan as stated above.  1.   Unstable angina:   Agree with plans for cardiac cath. Will see if she can be scheduled for cath today    I have spent a total of 40 minutes with patient reviewing hospital  notes , telemetry, EKGs, labs and examining patient as well as establishing an assessment and plan that was discussed with the patient. > 50% of time was spent in direct patient care.    Thayer Headings, Brooke Bonito., MD, [facility] 12/20/2019, 8:05 AM [address]  [place] Pager [phone]

## 2019-12-18 ENCOUNTER — Ambulatory Visit (INDEPENDENT_AMBULATORY_CARE_PROVIDER_SITE_OTHER): Payer: Self-pay | Admitting: Physician Assistant

## 2019-12-18 ENCOUNTER — Other Ambulatory Visit: Payer: Self-pay

## 2019-12-18 ENCOUNTER — Other Ambulatory Visit: Payer: Self-pay | Admitting: Physician Assistant

## 2019-12-18 ENCOUNTER — Encounter: Payer: Self-pay | Admitting: Physician Assistant

## 2019-12-18 VITALS — BP 118/72 | HR 74 | Ht 65.0 in | Wt 149.0 lb

## 2019-12-18 DIAGNOSIS — E7800 Pure hypercholesterolemia, unspecified: Secondary | ICD-10-CM

## 2019-12-18 DIAGNOSIS — I25110 Atherosclerotic heart disease of native coronary artery with unstable angina pectoris: Secondary | ICD-10-CM

## 2019-12-18 DIAGNOSIS — I10 Essential (primary) hypertension: Secondary | ICD-10-CM

## 2019-12-18 LAB — CBC
Hematocrit: 38.6 % (ref 34.0–46.6)
Hemoglobin: 12.7 g/dL (ref 11.1–15.9)
MCH: 30.8 pg (ref 26.6–33.0)
MCHC: 32.9 g/dL (ref 31.5–35.7)
MCV: 94 fL (ref 79–97)
Platelets: 451 10*3/uL — ABNORMAL HIGH (ref 150–450)
RBC: 4.13 x10E6/uL (ref 3.77–5.28)
RDW: 13.1 % (ref 11.7–15.4)
WBC: 6 10*3/uL (ref 3.4–10.8)

## 2019-12-18 LAB — BASIC METABOLIC PANEL WITH GFR
BUN/Creatinine Ratio: 32 — ABNORMAL HIGH (ref 9–23)
BUN: 33 mg/dL — ABNORMAL HIGH (ref 6–24)
CO2: 23 mmol/L (ref 20–29)
Calcium: 10.1 mg/dL (ref 8.7–10.2)
Chloride: 97 mmol/L (ref 96–106)
Creatinine, Ser: 1.04 mg/dL — ABNORMAL HIGH (ref 0.57–1.00)
GFR calc Af Amer: 68 mL/min/1.73
GFR calc non Af Amer: 59 mL/min/1.73 — ABNORMAL LOW
Glucose: 98 mg/dL (ref 65–99)
Potassium: 4.1 mmol/L (ref 3.5–5.2)
Sodium: 138 mmol/L (ref 134–144)

## 2019-12-18 MED ORDER — METOPROLOL SUCCINATE ER 50 MG PO TB24: 50.0000 mg | ORAL_TABLET | Freq: Every day | ORAL | 1 refills | Status: DC

## 2019-12-18 MED ORDER — NITROGLYCERIN 0.4 MG SL SUBL: 0.4000 mg | SUBLINGUAL_TABLET | SUBLINGUAL | 3 refills | Status: DC | PRN

## 2019-12-18 MED FILL — NITROGLYCERIN 0.4 MG TAB SL: 0.4 | 15 days supply | Qty: 25 | Fill #0

## 2019-12-18 MED FILL — METOPROLOL SUCCINATE ER 50: 50 | 30 days supply | Qty: 30 | Fill #0

## 2019-12-18 NOTE — Patient Instructions (Addendum)
Medication Instructions:  Your physician has recommended you make the following change in your medication:   1) Increase Toprol to 50 mg, 1 tablet by mouth once a day  *If you need a refill on your cardiac medications before your next appointment, please call your pharmacy*  Lab Work: You will have labs drawn today: BMET/CBC  If you have labs (blood work) drawn today and your tests are completely normal, you will receive your results only by: Marland Kitchen MyChart Message (if you have MyChart) OR . A paper copy in the mail If you have any lab test that is abnormal or we need to change your treatment, we will call you to review the results.  Testing/Procedures: Your physician has requested that you have a cardiac catheterization. Cardiac catheterization is used to diagnose and/or treat various heart conditions. Doctors may recommend this procedure for a number of different reasons. The most common reason is to evaluate chest pain. Chest pain can be a symptom of coronary artery disease (CAD), and cardiac catheterization can show whether plaque is narrowing or blocking your heart's arteries. This procedure is also used to evaluate the valves, as well as measure the blood flow and oxygen levels in different parts of your heart. For further information please visit [email or web address]. Please follow instruction sheet, as given.  Follow-Up:  On 01/02/20 at 2:15PM with Richardson Dopp, PA-C

## 2019-12-19 ENCOUNTER — Encounter (HOSPITAL_COMMUNITY)
Admission: RE | Disposition: A | Discharge status: Home / Self Care | Payer: Self-pay | Source: Home / Self Care | Attending: Cardiovascular Disease

## 2019-12-19 ENCOUNTER — Other Ambulatory Visit: Payer: Self-pay

## 2019-12-19 ENCOUNTER — Ambulatory Visit (HOSPITAL_COMMUNITY)
Admission: RE | Admit: 2019-12-19 | Discharge: 2019-12-19 | Disposition: A | Discharge status: Home / Self Care | Payer: Self-pay | Attending: Cardiovascular Disease | Admitting: Cardiovascular Disease

## 2019-12-19 DIAGNOSIS — Z955 Presence of coronary angioplasty implant and graft: Secondary | ICD-10-CM | POA: Insufficient documentation

## 2019-12-19 DIAGNOSIS — I10 Essential (primary) hypertension: Secondary | ICD-10-CM

## 2019-12-19 DIAGNOSIS — F329 Major depressive disorder, single episode, unspecified: Secondary | ICD-10-CM | POA: Insufficient documentation

## 2019-12-19 DIAGNOSIS — E785 Hyperlipidemia, unspecified: Secondary | ICD-10-CM | POA: Insufficient documentation

## 2019-12-19 DIAGNOSIS — Z79899 Other long term (current) drug therapy: Secondary | ICD-10-CM | POA: Insufficient documentation

## 2019-12-19 DIAGNOSIS — I25110 Atherosclerotic heart disease of native coronary artery with unstable angina pectoris: Secondary | ICD-10-CM

## 2019-12-19 DIAGNOSIS — Z7982 Long term (current) use of aspirin: Secondary | ICD-10-CM | POA: Insufficient documentation

## 2019-12-19 DIAGNOSIS — Z7902 Long term (current) use of antithrombotics/antiplatelets: Secondary | ICD-10-CM | POA: Insufficient documentation

## 2019-12-19 DIAGNOSIS — I25119 Atherosclerotic heart disease of native coronary artery with unspecified angina pectoris: Secondary | ICD-10-CM

## 2019-12-19 DIAGNOSIS — I252 Old myocardial infarction: Secondary | ICD-10-CM | POA: Insufficient documentation

## 2019-12-19 DIAGNOSIS — E7800 Pure hypercholesterolemia, unspecified: Secondary | ICD-10-CM | POA: Insufficient documentation

## 2019-12-19 DIAGNOSIS — I2510 Atherosclerotic heart disease of native coronary artery without angina pectoris: Secondary | ICD-10-CM | POA: Diagnosis present

## 2019-12-19 HISTORY — PX: LEFT HEART CATH AND CORONARY ANGIOGRAPHY: CATH118249

## 2019-12-19 SURGERY — LEFT HEART CATH AND CORONARY ANGIOGRAPHY
Anesthesia: LOCAL

## 2019-12-19 MED ORDER — HEPARIN (PORCINE) IN NACL 1000-0.9 UT/500ML-% IV SOLN
INTRAVENOUS | Status: AC
  Filled 2019-12-19: qty 1000

## 2019-12-19 MED ORDER — FENTANYL CITRATE (PF) 100 MCG/2ML IJ SOLN
INTRAMUSCULAR | Status: AC
  Filled 2019-12-19: qty 2

## 2019-12-19 MED ORDER — HEPARIN (PORCINE) IN NACL 1000-0.9 UT/500ML-% IV SOLN
INTRAVENOUS | Status: DC | PRN
  Administered 2019-12-19: 500 mL

## 2019-12-19 MED ORDER — MIDAZOLAM HCL 2 MG/2ML IJ SOLN
INTRAMUSCULAR | Status: DC | PRN
  Administered 2019-12-19: 1 mg via INTRAVENOUS

## 2019-12-19 MED ORDER — ACETAMINOPHEN 325 MG PO TABS: 650.0000 mg | ORAL_TABLET | ORAL | Status: DC | PRN

## 2019-12-19 MED ORDER — LABETALOL HCL 5 MG/ML IV SOLN: 10.0000 mg | INTRAVENOUS | Status: DC | PRN

## 2019-12-19 MED ORDER — HYDRALAZINE HCL 20 MG/ML IJ SOLN: 10.0000 mg | INTRAMUSCULAR | Status: DC | PRN

## 2019-12-19 MED ORDER — SODIUM CHLORIDE 0.9% FLUSH: 3.0000 mL | INTRAVENOUS | Status: DC | PRN

## 2019-12-19 MED ORDER — FENTANYL CITRATE (PF) 100 MCG/2ML IJ SOLN
INTRAMUSCULAR | Status: DC | PRN
  Administered 2019-12-19: 25 ug via INTRAVENOUS

## 2019-12-19 MED ORDER — ASPIRIN 81 MG PO CHEW: 81.0000 mg | CHEWABLE_TABLET | ORAL | Status: DC

## 2019-12-19 MED ORDER — SODIUM CHLORIDE 0.9 % IV SOLN: INTRAVENOUS | Status: DC

## 2019-12-19 MED ORDER — CLOPIDOGREL BISULFATE 75 MG PO TABS: 75.0000 mg | ORAL_TABLET | Freq: Every day | ORAL | Status: DC

## 2019-12-19 MED ORDER — ONDANSETRON HCL 4 MG/2ML IJ SOLN: 4.0000 mg | Freq: Four times a day (QID) | INTRAMUSCULAR | Status: DC | PRN

## 2019-12-19 MED ORDER — LIDOCAINE HCL (PF) 1 % IJ SOLN
INTRAMUSCULAR | Status: AC
  Filled 2019-12-19: qty 30

## 2019-12-19 MED ORDER — LIDOCAINE HCL (PF) 1 % IJ SOLN
INTRAMUSCULAR | Status: DC | PRN
  Administered 2019-12-19: 20 mL

## 2019-12-19 MED ORDER — HYDROCHLOROTHIAZIDE 25 MG PO TABS
12.5000 mg | ORAL_TABLET | Freq: Every day | ORAL | 3 refills | Status: DC
Start: 2019-12-19 — End: 2020-01-02

## 2019-12-19 MED ORDER — ASPIRIN 81 MG PO CHEW: 81.0000 mg | CHEWABLE_TABLET | Freq: Every day | ORAL | Status: DC

## 2019-12-19 MED ORDER — HEPARIN SODIUM (PORCINE) 1000 UNIT/ML IJ SOLN
INTRAMUSCULAR | Status: AC
  Filled 2019-12-19: qty 1

## 2019-12-19 MED ORDER — VERAPAMIL HCL 2.5 MG/ML IV SOLN
INTRAVENOUS | Status: AC
  Filled 2019-12-19: qty 2

## 2019-12-19 MED ORDER — NITROGLYCERIN 1 MG/10 ML FOR IR/CATH LAB
INTRA_ARTERIAL | Status: AC
  Filled 2019-12-19: qty 10

## 2019-12-19 MED ORDER — IOHEXOL 350 MG/ML SOLN
INTRAVENOUS | Status: DC | PRN
  Administered 2019-12-19: 60 mL

## 2019-12-19 MED ORDER — SODIUM CHLORIDE 0.9 % WEIGHT BASED INFUSION: 1.0000 mL/kg/h | INTRAVENOUS | Status: DC

## 2019-12-19 MED ORDER — MIDAZOLAM HCL 2 MG/2ML IJ SOLN
INTRAMUSCULAR | Status: AC
  Filled 2019-12-19: qty 2

## 2019-12-19 MED ORDER — MORPHINE SULFATE (PF) 2 MG/ML IV SOLN: 2.0000 mg | INTRAVENOUS | Status: DC | PRN

## 2019-12-19 MED ORDER — SODIUM CHLORIDE 0.9 % WEIGHT BASED INFUSION
3.0000 mL/kg/h | INTRAVENOUS | Status: AC
  Administered 2019-12-19: 3 mL/kg/h via INTRAVENOUS

## 2019-12-19 MED ORDER — SODIUM CHLORIDE 0.9 % IV SOLN: 250.0000 mL | INTRAVENOUS | Status: DC | PRN

## 2019-12-19 MED ORDER — SODIUM CHLORIDE 0.9% FLUSH: 3.0000 mL | Freq: Two times a day (BID) | INTRAVENOUS | Status: DC

## 2019-12-19 SURGICAL SUPPLY — 9 items
CATH INFINITI 5FR MULTPACK ANG (CATHETERS) ×2 IMPLANT
CLOSURE MYNX CONTROL 5F (Vascular Products) ×2 IMPLANT
KIT HEART LEFT (KITS) ×2 IMPLANT
PACK CARDIAC CATHETERIZATION (CUSTOM PROCEDURE TRAY) ×2 IMPLANT
SHEATH PINNACLE 5F 10CM (SHEATH) ×2 IMPLANT
SYR MEDRAD MARK 7 150ML (SYRINGE) ×2 IMPLANT
TRANSDUCER W/STOPCOCK (MISCELLANEOUS) ×2 IMPLANT
TUBING CIL FLEX 10 FLL-RA (TUBING) ×2 IMPLANT
WIRE EMERALD 3MM-J .035X150CM (WIRE) ×2 IMPLANT

## 2019-12-19 NOTE — Discharge Instructions (Signed)
Angiogram, Care After This sheet gives you information about how to care for yourself after your procedure. Your health care provider may also give you more specific instructions. If you have problems or questions, contact your health care provider. What can I expect after the procedure? After the procedure, it is common to have bruising and tenderness at the catheter insertion area. Follow these instructions at home: Insertion site care  Follow instructions from your health care provider about how to take care of your insertion site. Make sure you: ? Wash your hands with soap and water before you change your bandage (dressing). If soap and water are not available, use hand sanitizer. ? Change your dressing as told by your health care provider. ? Leave stitches (sutures), skin glue, or adhesive strips in place. These skin closures may need to stay in place for 2 weeks or longer. If adhesive strip edges start to loosen and curl up, you may trim the loose edges. Do not remove adhesive strips completely unless your health care provider tells you to do that.  Do not take baths, swim, or use a hot tub until your health care provider approves.  You may shower 24-48 hours after the procedure or as told by your health care provider. ? Gently wash the site with plain soap and water. ? Pat the area dry with a clean towel. ? Do not rub the site. This may cause bleeding.  Do not apply powder or lotion to the site. Keep the site clean and dry.  Check your insertion site every day for signs of infection. Check for: ? Redness, swelling, or pain. ? Fluid or blood. ? Warmth. ? Pus or a bad smell. Activity  Rest as told by your health care provider, usually for 1-2 days.  Do not lift anything that is heavier than 10 lbs. (4.5 kg) or as told by your health care provider.  Do not drive for 24 hours if you were given a medicine to help you relax (sedative).  Do not drive or use heavy machinery while  taking prescription pain medicine. General instructions   Return to your normal activities as told by your health care provider, usually in about a week. Ask your health care provider what activities are safe for you.  If the catheter site starts bleeding, lie flat and put pressure on the site. If the bleeding does not stop, get help right away. This is a medical emergency.  Drink enough fluid to keep your urine clear or pale yellow. This helps flush the contrast dye from your body.  Take over-the-counter and prescription medicines only as told by your health care provider.  Keep all follow-up visits as told by your health care provider. This is important. Contact a health care provider if:  You have a fever or chills.  You have redness, swelling, or pain around your insertion site.  You have fluid or blood coming from your insertion site.  The insertion site feels warm to the touch.  You have pus or a bad smell coming from your insertion site.  You have bruising around the insertion site.  You notice blood collecting in the tissue around the catheter site (hematoma). The hematoma may be painful to the touch. Get help right away if:  You have severe pain at the catheter insertion area.  The catheter insertion area swells very fast.  The catheter insertion area is bleeding, and the bleeding does not stop when you hold steady pressure on the area.    The area near or just beyond the catheter insertion site becomes pale, cool, tingly, or numb. These symptoms may represent a serious problem that is an emergency. Do not wait to see if the symptoms will go away. Get medical help right away. Call your local emergency services (911 in the [place] Do not drive yourself to the hospital. Summary  After the procedure, it is common to have bruising and tenderness at the catheter insertion area.  After the procedure, it is important to rest and drink plenty of fluids.  Do not take baths,  swim, or use a hot tub until your health care provider says it is okay to do so. You may shower 24-48 hours after the procedure or as told by your health care provider.  If the catheter site starts bleeding, lie flat and put pressure on the site. If the bleeding does not stop, get help right away. This is a medical emergency. This information is not intended to replace advice given to you by your health care provider. Make sure you discuss any questions you have with your health care provider. Document Revised: 04/29/2017 Document Reviewed: 04/21/2016 Elsevier Patient Education  2020 Elsevier Inc.  

## 2019-12-20 ENCOUNTER — Encounter (HOSPITAL_COMMUNITY): Payer: Self-pay | Admitting: Cardiovascular Disease

## 2019-12-20 MED FILL — Nitroglycerin IV Soln 100 MCG/ML in D5W: INTRA_ARTERIAL | Qty: 10 | Status: AC

## 2019-12-26 ENCOUNTER — Other Ambulatory Visit: Payer: Self-pay | Admitting: *Deleted

## 2019-12-26 ENCOUNTER — Other Ambulatory Visit: Payer: Self-pay

## 2019-12-26 DIAGNOSIS — I10 Essential (primary) hypertension: Secondary | ICD-10-CM

## 2019-12-26 LAB — BASIC METABOLIC PANEL
BUN/Creatinine Ratio: 31 — ABNORMAL HIGH (ref 9–23)
BUN: 42 mg/dL — ABNORMAL HIGH (ref 6–24)
CO2: 24 mmol/L (ref 20–29)
Calcium: 10.6 mg/dL — ABNORMAL HIGH (ref 8.7–10.2)
Chloride: 99 mmol/L (ref 96–106)
Creatinine, Ser: 1.35 mg/dL — ABNORMAL HIGH (ref 0.57–1.00)
GFR calc Af Amer: 50 mL/min/{1.73_m2} — ABNORMAL LOW (ref >59)
GFR calc non Af Amer: 43 mL/min/{1.73_m2} — ABNORMAL LOW (ref >59)
Glucose: 107 mg/dL — ABNORMAL HIGH (ref 65–99)
Potassium: 4.6 mmol/L (ref 3.5–5.2)
Sodium: 138 mmol/L (ref 134–144)

## 2020-01-02 ENCOUNTER — Encounter: Payer: Self-pay | Admitting: Physician Assistant

## 2020-01-02 ENCOUNTER — Other Ambulatory Visit: Payer: Self-pay | Admitting: Physician Assistant

## 2020-01-02 ENCOUNTER — Ambulatory Visit (INDEPENDENT_AMBULATORY_CARE_PROVIDER_SITE_OTHER): Payer: Self-pay | Admitting: Physician Assistant

## 2020-01-02 ENCOUNTER — Other Ambulatory Visit: Payer: Self-pay

## 2020-01-02 VITALS — BP 130/80 | HR 74 | Ht 65.0 in | Wt 150.8 lb

## 2020-01-02 DIAGNOSIS — R0602 Shortness of breath: Secondary | ICD-10-CM

## 2020-01-02 DIAGNOSIS — I10 Essential (primary) hypertension: Secondary | ICD-10-CM

## 2020-01-02 DIAGNOSIS — N179 Acute kidney failure, unspecified: Secondary | ICD-10-CM

## 2020-01-02 DIAGNOSIS — R911 Solitary pulmonary nodule: Secondary | ICD-10-CM

## 2020-01-02 DIAGNOSIS — E7800 Pure hypercholesterolemia, unspecified: Secondary | ICD-10-CM

## 2020-01-02 DIAGNOSIS — I25119 Atherosclerotic heart disease of native coronary artery with unspecified angina pectoris: Secondary | ICD-10-CM

## 2020-01-02 MED ORDER — ISOSORBIDE MONONITRATE ER 30 MG PO TB24
30.0000 mg | ORAL_TABLET | Freq: Every day | ORAL | 3 refills | Status: DC
Start: 2020-01-02 — End: 2020-04-07

## 2020-01-02 MED FILL — ISOSORBIDE MN ER 30 MG TAB: 30 | 30 days supply | Qty: 30 | Fill #0

## 2020-01-02 MED FILL — ?EZETIMIBE 10 MG TABLET: 10 | 30 days supply | Qty: 30 | Fill #2

## 2020-01-02 MED FILL — LOSARTAN POTASSIUM 100 MG T: 100 | 30 days supply | Qty: 30 | Fill #4

## 2020-01-02 NOTE — Progress Notes (Signed)
Cardiology Office Note:    Date:  01/02/2020   ID:  [name], DOB July 07, 1960, MRN [id number]  PCP:  Ladell Pier, MD  Cardiologist:  Dorris Carnes, MD  Electrophysiologist:  None   Referring MD: Ladell Pier, MD   Chief Complaint: Hospitalization Follow-up (S/p cardiac catheterization)    Patient Profile:    Monique Hunt is a 59 y.o. female with:   Coronary artery disease  S/p NSTEMI 7/19 >> PCI: DES to pLAD  01-02-23:  oLAD 40, mLAD stent patent  ETT-Echocardiogram 07/2018: no ischemia  Hypertension   Hyperlipidemia    Prior CV studies: Cardiac catheterization 2020-01-02 LAD ostial 40, mid stent patent EF 55-65  Stress echocardiogram 07/10/2018 No echocardiographic evidence for stress-induced ischemia No LV outflow tract obstruction at baseline or after exercise  Cardiac catheterization 12/16/2017 LAD proximal 75 EF 55-65 12/19/2017: PCI -3.5 x 12 mm Synergy DES to the LAD  History of Present Illness:    Ms. Fesperman was last seen 12/18/2019.  She was having symptoms of progressively worsening angina and was set up for cardiac catheterization.  This demonstrated a patent mid LAD stent and 40% ostial LAD stenosis.  Her chest pain was felt to be noncardiac and medical therapy has been continued.  She returns for follow-up.    She is here alone today.  She continues to have the same symptoms.  She notes mainly exertional shortness of breath with most activities.  She does have some chest heaviness associated with this.  She has relief when she takes nitroglycerin.  She has not had orthopnea, PND, leg swelling or syncope.  She has never smoked cigarettes.  She smoked marijuana in the 1980s.  She has not had exposures to toxic fumes in the past.  She does have a lung nodule that is followed by serial CT scans.  The last CT scan in 2020 showed stable findings.  Past Medical History:  Diagnosis Date  . Coronary artery disease   . Depression   .  History of kidney stones   . Hyperlipidemia   . Hypertension   . Migraine    "stopped ~ 2010" (12/15/2017)  . NSTEMI (non-ST elevated myocardial infarction) ([place]) 12/15/2017    Current Medications: Current Meds  Medication Sig  . amLODipine (NORVASC) 10 MG tablet Take 1 tablet (10 mg total) by mouth daily.  Marland Kitchen aspirin 81 MG chewable tablet Chew 1 tablet (81 mg total) by mouth daily.  Marland Kitchen atorvastatin (LIPITOR) 80 MG tablet TAKE 1 TABLET (80 MG TOTAL) BY MOUTH DAILY AT 6 PM.  . clopidogrel (PLAVIX) 75 MG tablet TAKE 1 TABLET BY MOUTH DAILY WITH BREAKFAST.  Marland Kitchen dicyclomine (BENTYL) 10 MG capsule Take 2 capsules (20 mg total) by mouth every 8 (eight) hours as needed for spasms. For abdominal cramps and diarrhea  . esomeprazole (NEXIUM) 20 MG capsule Take 20 mg by mouth daily at 12 noon.  . ezetimibe (ZETIA) 10 MG tablet Take 1 tablet (10 mg total) by mouth daily.  . fenofibrate 160 MG tablet TAKE 1 TABLET BY MOUTH DAILY.  Marland Kitchen losartan (COZAAR) 100 MG tablet TAKE 1 TABLET BY MOUTH DAILY.  . metoprolol succinate (TOPROL-XL) 50 MG 24 hr tablet Take 1 tablet (50 mg total) by mouth daily.  . nitroGLYCERIN (NITROSTAT) 0.4 MG SL tablet Place 1 tablet (0.4 mg total) under the tongue every 5 (five) minutes x 3 doses as needed for chest pain.  Marland Kitchen venlafaxine XR (EFFEXOR-XR) 75 MG 24 hr capsule Take  1 capsule (75 mg total) by mouth daily with breakfast.     Allergies:   Patient has no known allergies.   Social History   Tobacco Use  . Smoking status: Never Smoker  . Smokeless tobacco: Never Used  Vaping Use  . Vaping Use: Never used  Substance Use Topics  . Alcohol use: Yes    Comment: 12/15/2017 "couple mixed drinks/month"  . Drug use: Never     Family Hx: The patient's family history includes Breast cancer in her mother; CAD in her father; Heart disease in her father; Hyperlipidemia in her brother and sister. There is no history of Colon cancer, Esophageal cancer, Rectal cancer, or Stomach  cancer.  Review of Systems  Constitutional: Negative for fever.  Respiratory: Negative for cough and wheezing.      EKGs/Labs/Other Test Reviewed:    EKG:  EKG is    today.  The ekg ordered today demonstrates normal sinus rhythm, heart 74, normal axis, no ST-T wave changes, QTC 406, no change from prior tracing  Recent Labs: 03/30/2019: ALT 31 11/05/2019: TSH 0.622 12/18/2019: Hemoglobin 12.7; Platelets 451 12/26/2019: BUN 42; Creatinine, Ser 1.35; Potassium 4.6; Sodium 138   Recent Lipid Panel Lab Results  Component Value Date/Time   CHOL 200 (H) 06/18/2019 08:36 AM   TRIG 104 06/18/2019 08:36 AM   HDL 67 06/18/2019 08:36 AM   CHOLHDL 3.0 06/18/2019 08:36 AM   CHOLHDL 7.2 12/16/2017 07:14 AM   LDLCALC 115 (H) 06/18/2019 08:36 AM    Physical Exam:    VS:  BP 130/80   Pulse 74   Ht 5\' 5"  (1.651 m)   Wt 150 lb 12.8 oz (68.4 kg)   LMP 02/28/2011   SpO2 98%   BMI 25.09 kg/m     Wt Readings from Last 3 Encounters:  01/02/20 150 lb 12.8 oz (68.4 kg)  12/19/19 150 lb (68 kg)  12/18/19 149 lb (67.6 kg)     Constitutional:      Appearance: Healthy appearance. Not in distress.  Neck:     Vascular: JVD normal.  Pulmonary:     Effort: Pulmonary effort is normal.     Breath sounds: No wheezing. No rales.  Cardiovascular:     Normal rate. Regular rhythm. Normal S1. Normal S2.     Murmurs: There is no murmur.     Comments: R FA site without hematoma or bruit Edema:    Peripheral edema absent.  Abdominal:     Palpations: Abdomen is soft.  Skin:    General: Skin is warm and dry.  Neurological:     Mental Status: Alert and oriented to person, place and time.     Cranial Nerves: Cranial nerves are intact.      ASSESSMENT & PLAN:    1. Coronary artery disease involving native coronary artery of native heart with angina pectoris ([place]) 2. Shortness of breath History of non-STEMI in July 2019 treated with a drug-eluting stent to the LAD.  Recent cardiac catheterization  demonstrated patent LAD stent.  She continues to have exertional shortness of breath with associated chest discomfort.  This is reminiscent of her previous angina.  She does note relief with taking as needed nitroglycerin.  She is somewhat frustrated that she cannot participate in activities that she enjoys.  She does not have a history of smoking and does not have a history of asthma or COPD.  Question if she may have microvascular angina or vasospastic disease.  As she does have  relief with nitroglycerin, I have suggested that we place her on isosorbide.  She has had normal ejection fraction on LV gram.  Her LVEDP was normal on cardiac catheterization.  I have suggested that we obtain an echocardiogram to assess pulmonary pressures and diastolic function.  If her echocardiogram is normal and she has no improvement with isosorbide, I have recommended that we refer her to pulmonology for further evaluation.  -Obtain 2D echocardiogram  -Start isosorbide mononitrate 30 mg daily  -Continue amlodipine, aspirin, atorvastatin, clopidogrel, ezetimibe, metoprolol succinate  -Consider referral to pulmonology if no improvement in symptoms  3. AKI (acute kidney injury) ([place]) Her creatinine was recently increased and we discontinued her hydrochlorothiazide.  Obtain follow-up CMET in 2 weeks.  4. Essential hypertension The patient's blood pressure is controlled on her current regimen.  Continue current therapy.   5. Hypercholesterolemia Continue atorvastatin, ezetimibe, fenofibrate.  Arrange fasting CMET, lipids in 2 weeks.  6. Lung nodule, solitary This is followed by primary care.  She is due for repeat CT scan.  I have encouraged her to go ahead and get this arranged.     Dispo:  Return in about 8 weeks (around 02/27/2020) for Routine Follow Up, w/ Dr. Harrington Challenger, or Richardson Dopp, PA-C, in person.   Medication Adjustments/Labs and Tests Ordered: Current medicines are reviewed at length with the patient today.   Concerns regarding medicines are outlined above.  Tests Ordered: Orders Placed This Encounter  Procedures  . Comprehensive metabolic panel  . Lipid panel  . EKG 12-Lead  . ECHOCARDIOGRAM COMPLETE   Medication Changes: Meds ordered this encounter  Medications  . isosorbide mononitrate (IMDUR) 30 MG 24 hr tablet    Sig: Take 1 tablet (30 mg total) by mouth daily.    Dispense:  90 tablet    Refill:  3    Signed, Richardson Dopp, PA-C  01/02/2020 3:04 PM    [facility] [address], [place], [place]  [address] Phone: [phone]; Fax: [phone]   Attending Note:   The patient was seen and examined.  Agree with assessment and plan as noted above.  Changes made to the above note as needed.  Patient seen and independently examined with  Richardson Dopp, PA .   We discussed all aspects of the encounter. I agree with the assessment and plan as stated above.  1.   Unstable angina:   Agree with plans for cardiac cath. Will see if she can be scheduled for cath today    I have spent a total of 40 minutes with patient reviewing hospital  notes , telemetry, EKGs, labs and examining patient as well as establishing an assessment and plan that was discussed with the patient. > 50% of time was spent in direct patient care.    Thayer Headings, Brooke Bonito., MD, [facility] 01/02/2020, 3:04 PM [address]  [place] Pager [phone]

## 2020-01-02 NOTE — Patient Instructions (Addendum)
Medication Instructions:  Your physician has recommended you make the following change in your medication:   1) Start Imdur 30 mg, 1 tablet by mouth once a day.  *If you need a refill on your cardiac medications before your next appointment, please call your pharmacy*  Lab Work: Your physician recommends that you return for lab work in 2 weeks, if possible schedule on the same day as echocardiogram.  Testing/Procedures: Your physician has requested that you have an echocardiogram. Echocardiography is a painless test that uses sound waves to create images of your heart. It provides your doctor with information about the size and shape of your heart and how well your hearts chambers and valves are working. This procedure takes approximately one hour. There are no restrictions for this procedure.  Follow-Up: At [facility], you and your health needs are our priority.  As part of our continuing mission to provide you with exceptional heart care, we have created designated Provider Care Teams.  These Care Teams include your primary Cardiologist (physician) and Advanced Practice Providers (APPs -  Physician Assistants and Nurse Practitioners) who all work together to provide you with the care you need, when you need it.  We recommend signing up for the patient portal called "MyChart".  Sign up information is provided on this After Visit Summary.  MyChart is used to connect with patients for Virtual Visits (Telemedicine).  Patients are able to view lab/test results, encounter notes, upcoming appointments, etc.  Non-urgent messages can be sent to your provider as well.   To learn more about what you can do with MyChart, go to [email or web address].    Your next appointment:   6-8 week(s)  The format for your next appointment:   In Person  Provider:   You may see Dorris Carnes, MD or Richardson Dopp, PA-C

## 2020-01-04 MED FILL — ?CLOPIDOGREL 75MG TA: 75 | 30 days supply | Qty: 30 | Fill #4

## 2020-01-10 ENCOUNTER — Telehealth: Payer: Self-pay | Admitting: Physician Assistant

## 2020-01-10 NOTE — Telephone Encounter (Signed)
Forms from [facility] received on 01/10/20. Completed patient auth attached. Took form to [employer] for completion. 01/10/20 vlm

## 2020-01-10 NOTE — Telephone Encounter (Signed)
Forms from [place] received on 01/10/20. Contacted patient to complete authorization. Voicemail left. 01/10/20 vlm

## 2020-01-11 MED FILL — VENLAFAXINE HCL ER 75 MG CA: 75 | 30 days supply | Qty: 30 | Fill #2

## 2020-01-11 MED FILL — AMLODIPINE BESYLATE 10 MG T: 10 | 30 days supply | Qty: 30 | Fill #10

## 2020-01-11 MED FILL — FENOFIBRATE 160 MG TABLET: 160 | 30 days supply | Qty: 30 | Fill #6

## 2020-01-11 MED FILL — ?METOPROLOL SUCC ER 50M TAB: 50 | 30 days supply | Qty: 30 | Fill #1

## 2020-01-15 ENCOUNTER — Other Ambulatory Visit: Payer: Self-pay

## 2020-01-15 ENCOUNTER — Other Ambulatory Visit (HOSPITAL_COMMUNITY): Payer: Self-pay

## 2020-01-15 DIAGNOSIS — E7800 Pure hypercholesterolemia, unspecified: Secondary | ICD-10-CM

## 2020-01-15 DIAGNOSIS — I25119 Atherosclerotic heart disease of native coronary artery with unspecified angina pectoris: Secondary | ICD-10-CM

## 2020-01-15 DIAGNOSIS — N179 Acute kidney failure, unspecified: Secondary | ICD-10-CM

## 2020-01-15 DIAGNOSIS — I10 Essential (primary) hypertension: Secondary | ICD-10-CM

## 2020-01-16 ENCOUNTER — Telehealth: Payer: Self-pay

## 2020-01-16 DIAGNOSIS — E7800 Pure hypercholesterolemia, unspecified: Secondary | ICD-10-CM

## 2020-01-16 DIAGNOSIS — I25119 Atherosclerotic heart disease of native coronary artery with unspecified angina pectoris: Secondary | ICD-10-CM

## 2020-01-16 LAB — COMPREHENSIVE METABOLIC PANEL
ALT: 37 IU/L — ABNORMAL HIGH (ref 0–32)
AST: 28 IU/L (ref 0–40)
Albumin/Globulin Ratio: 1.9 (ref 1.2–2.2)
Albumin: 4.8 g/dL (ref 3.8–4.9)
Alkaline Phosphatase: 47 IU/L — ABNORMAL LOW (ref 48–121)
BUN/Creatinine Ratio: 27 — ABNORMAL HIGH (ref 9–23)
BUN: 33 mg/dL — ABNORMAL HIGH (ref 6–24)
Bilirubin Total: 0.3 mg/dL (ref 0.0–1.2)
CO2: 21 mmol/L (ref 20–29)
Calcium: 10.5 mg/dL — ABNORMAL HIGH (ref 8.7–10.2)
Chloride: 102 mmol/L (ref 96–106)
Creatinine, Ser: 1.23 mg/dL — ABNORMAL HIGH (ref 0.57–1.00)
GFR calc Af Amer: 55 mL/min/{1.73_m2} — ABNORMAL LOW (ref >59)
GFR calc non Af Amer: 48 mL/min/{1.73_m2} — ABNORMAL LOW (ref >59)
Globulin, Total: 2.5 g/dL (ref 1.5–4.5)
Glucose: 96 mg/dL (ref 65–99)
Potassium: 4.9 mmol/L (ref 3.5–5.2)
Sodium: 138 mmol/L (ref 134–144)
Total Protein: 7.3 g/dL (ref 6.0–8.5)

## 2020-01-16 LAB — LIPID PANEL
Chol/HDL Ratio: 3.3 ratio (ref 0.0–4.4)
Cholesterol, Total: 159 mg/dL (ref 100–199)
HDL: 48 mg/dL (ref >39)
LDL Chol Calc (NIH): 86 mg/dL (ref 0–99)
Triglycerides: 140 mg/dL (ref 0–149)
VLDL Cholesterol Cal: 25 mg/dL (ref 5–40)

## 2020-01-16 MED ORDER — ROSUVASTATIN CALCIUM 40 MG PO TABS
40.0000 mg | ORAL_TABLET | Freq: Every day | ORAL | 1 refills | Status: DC
Start: 2020-01-16 — End: 2020-09-25

## 2020-01-16 MED FILL — ?ROSUVASTATIN CALCIUM 40M T: 40 | 30 days supply | Qty: 30 | Fill #0

## 2020-01-16 NOTE — Telephone Encounter (Signed)
The patient has been notified of the result and verbalized understanding.  All questions (if any) were answered. Patient will stop Atorvastatin and start Rosuvastatin 40 mg, 1 tablet by mouth once a day. Patient will have BMET checked at 02/20/20 visit with Nicki Reaper and come back on 04/18/20 for lipids/LFTs. Orders in and prescription for rosuvastatin sent to pharmacy. Mady Haagensen, [place] 01/16/2020 11:40 AM

## 2020-01-16 NOTE — Telephone Encounter (Signed)
-----   Message from Liliane Shi, PA-C sent at 01/16/2020  8:15 AM EDT ----- Creatinine stable.  K+ normal.  LFTs ok.  Ca2+ elevated.  LDL good but not at goal. BUN/Creatinine ratio elevated - I think her Creatinine is up b/c she is s/w dehydrated. We should switch to Rosuvastatin to get LDL to goal (<70).  PLAN:   - Pt needs to make sure she is hydrating well and drinking plenty of H2O during day  - If urine is not clear or very pale yellow, she is not drinking enough  - F/U with PCP for high calcium - send copy of labs to PCP  - DC Atorvastatin  - Start Rosuvastatin 40 mg once daily   - BMET in 4 weeks  - Lipids and LFTs in 3 mos  Richardson Dopp, PA-C    01/16/2020 8:07 AM

## 2020-01-16 NOTE — Progress Notes (Signed)
Patient ID: Monique Hunt                 DOB: 03-25-1961                    MRN: [id number]     HPI: Monique Hunt is a 59 y.o. female patient referred to lipid clinic by Dr Harrington Challenger. PMH is significant for CAD s/p NSTEMI in July 2019 s/p DES to pLAD, HTN, HLD, depression, migraine, and lung nodule followed by serial CT scans. She reported worsening angina last month and was referred for cath. Cath 12/19/19 showed patent mid LAD stent and 40% ostial LAD stenosis, LVEF 55-65%. Chest pain was felt to be noncardiac and medical therapy was continued. She was seen a few weeks ago and reported mainly exertional SOB with most activities as well as some chest heaviness, relief when she takes NTG. She was started on Imdur. Follow up lipid panel revealed LDL of 86. Her atorvastatin 80mg  was changed to rosuvastatin 40mg  and she was referred to lipid clinic to discuss PCSK9i therapy.  Pt presents today in good spirits. She plans to pick up her rosuvastatin today from the pharmacy. She does not have insurance and fills meds at [facility]. Reports tolerating her medications well. She has felt much better since starting Imdur. She went for a 45 minute walk the other day without chest pain, whereas previously she had to stop walking after 4 houses due to chest pain. She has been taking Aleve prn for headaches associated with the Imdur, about 3x last week. Admits diet is poor - eats fried food frequently.  Current Medications: rosuvastatin 40mg  daily, ezetimibe 10mg  daily, fenofibrate 160mg  daily (TG previously elevated at 479 last year) Previously tried: atorvastatin 80mg  daily - changed to rosuvastatin 40mg   Risk Factors: CAD s/p NSTEMI, angina, family history of CAD LDL goal: 55mg /dL  Diet: AM - biscuit. Lunch - sandwich (ham and cheese, lettuce, tomato). Dinner - fried foods. Drinks diet sodas and unsweet tea.  Exercise: Limited due to heat and chest pain  Family History: father  with CAD and heart disease (diagnosed ~age 66), brother and sister with HLD  Social History: Denies tobacco and illicit drug use. Occasionally has a few alcoholic drinks per month.  Labs: 01/15/20: TC 159, TG 140, HDL 48, LDL 86 (atorvastatin 80mg  daily, ezetimibe 10mg  daily, fenofibrate 160mg  daily)  Past Medical History:  Diagnosis Date   Coronary artery disease    Depression    History of kidney stones    Hyperlipidemia    Hypertension    Migraine    "stopped ~ 2010" (12/15/2017)   NSTEMI (non-ST elevated myocardial infarction) ([place]) 12/15/2017    Current Outpatient Medications on File Prior to Visit  Medication Sig Dispense Refill   amLODipine (NORVASC) 10 MG tablet Take 1 tablet (10 mg total) by mouth daily. 90 tablet 3   aspirin 81 MG chewable tablet Chew 1 tablet (81 mg total) by mouth daily. 30 tablet 11   clopidogrel (PLAVIX) 75 MG tablet TAKE 1 TABLET BY MOUTH DAILY WITH BREAKFAST. 30 tablet 8   dicyclomine (BENTYL) 10 MG capsule Take 2 capsules (20 mg total) by mouth every 8 (eight) hours as needed for spasms. For abdominal cramps and diarrhea 45 capsule 1   esomeprazole (NEXIUM) 20 MG capsule Take 20 mg by mouth daily at 12 noon.     ezetimibe (ZETIA) 10 MG tablet Take 1 tablet (10 mg total) by mouth daily.  90 tablet 3   fenofibrate 160 MG tablet TAKE 1 TABLET BY MOUTH DAILY. 90 tablet 2   isosorbide mononitrate (IMDUR) 30 MG 24 hr tablet Take 1 tablet (30 mg total) by mouth daily. 90 tablet 3   losartan (COZAAR) 100 MG tablet TAKE 1 TABLET BY MOUTH DAILY. 30 tablet 8   metoprolol succinate (TOPROL-XL) 50 MG 24 hr tablet Take 1 tablet (50 mg total) by mouth daily. 90 tablet 1   nitroGLYCERIN (NITROSTAT) 0.4 MG SL tablet Place 1 tablet (0.4 mg total) under the tongue every 5 (five) minutes x 3 doses as needed for chest pain. 30 tablet 3   rosuvastatin (CRESTOR) 40 MG tablet Take 1 tablet (40 mg total) by mouth daily. 90 tablet 1   venlafaxine XR  (EFFEXOR-XR) 75 MG 24 hr capsule Take 1 capsule (75 mg total) by mouth daily with breakfast. 30 capsule 6   No current facility-administered medications on file prior to visit.    No Known Allergies  Assessment/Plan:  1. Hyperlipidemia - LDL 86 on atorvastatin 80mg  daily, ezetimibe 10mg  daily, and fenofibrate 160mg  daily, TG normalized now (previously elevated at 479 last year). LDL goal < 55 due to premature ASCVD and family history of CAD. She is changing to rosuvastatin 40mg  daily today as advised by Richardson Dopp. Cannot change to Nexlizet since pt is uninsured and manufacturer does not have pt assistance program. However, PCSK9i do. Discussed administration technique and benefits of PCSK9i therapy. Pt filled out pt assistance forms for Praluent 75mg  Q2W. Discussed heart healthy diet with pt. Will plan to recheck labs 2-3 months after she is approved. She will continue on all other lipid lowering meds for now, but can consider stopping ezetimibe in the future pending lab results.  Pt also inquired about rise in SCr from most recent labs. She has been using Aleve a few days a week for headaches secondary to Imdur (has been pleased by improvement in chest pain on therapy). Advised her to stop using NSAIDs and change to Tylenol. Also encouraged her to drink more water as well.  Monique Hunt E. Raymonde Hamblin, PharmD, BCACP, [place] [address], [place], [place] [address] Phone: [phone]; Fax: [phone] 01/17/2020 2:19 PM

## 2020-01-16 NOTE — Telephone Encounter (Signed)
Form completed and faxed to [facility] on 01/16/20. 01/16/20 vlm

## 2020-01-17 ENCOUNTER — Other Ambulatory Visit: Payer: Self-pay

## 2020-01-17 ENCOUNTER — Ambulatory Visit (INDEPENDENT_AMBULATORY_CARE_PROVIDER_SITE_OTHER): Payer: Self-pay | Admitting: Pharmacist

## 2020-01-17 DIAGNOSIS — E785 Hyperlipidemia, unspecified: Secondary | ICD-10-CM

## 2020-01-17 NOTE — Patient Instructions (Addendum)
It was nice to meet you today!  Your LDL cholesterol is 86 and your goal is < 55  I will submit paperwork for Praluent injections to see if they will cover the cost of the medication. The injections are given ever 14 days in the fatty tissue of your abdomen or upper outer thigh. Store the medication in the fridge until you are ready to give it. You can let the pen sit out for 30 minutes to warm to room temperature before giving an injection if you wish. Praluent will lower your LDL cholesterol by 60% and helps to prevent heart attacks and stroke.  Continue taking rosuvastatin 40mg  daily, ezetimibe 10mg  daily, and fenofibrate 160mg  daily for your cholesterol  We will plan to recheck your cholesterol 2-3 months after you begin Praluent injections  Call Jinny Blossom, Pharmacist [phone] if you hear from the MyPraluent patient assistance program

## 2020-01-18 ENCOUNTER — Telehealth: Payer: Self-pay | Admitting: Pharmacist

## 2020-01-18 NOTE — Telephone Encounter (Signed)
Pt returned call and states Praluent is being shipped to her home. She will call with any concerns.

## 2020-01-18 NOTE — Telephone Encounter (Signed)
Praluent approved through [employer] pt assistance program free of charge. No end date given for enrollment.  Called pt to advise her of approval and left a message for her. She will be contacted by program at [phone] to coordinate medication delivery. Can keep follow up labs scheduled in Nov.

## 2020-01-24 NOTE — H&P (Signed)
I agree with plan to proceed with cath.    Mertie Moores, MD  01/24/2020 6:39 AM    [facility] [place],  [place], [place]  [address] Phone: [phone]; Fax: [phone]

## 2020-01-25 ENCOUNTER — Other Ambulatory Visit (HOSPITAL_COMMUNITY): Payer: Self-pay

## 2020-01-28 MED FILL — ?CLOPIDOGREL 75MG TA: 75 | 30 days supply | Qty: 30 | Fill #5

## 2020-01-28 MED FILL — LOSARTAN POTASSIUM 100 MG T: 100 | 30 days supply | Qty: 30 | Fill #5

## 2020-01-28 MED FILL — ?EZETIMIBE 10 MG TABLET: 10 | 30 days supply | Qty: 30 | Fill #3

## 2020-01-28 MED FILL — ISOSORBIDE MN ER 30 MG TAB: 30 | 30 days supply | Qty: 30 | Fill #1

## 2020-01-28 MED FILL — NITROGLYCERIN 0.4 MG TAB SL: 0.4 | 15 days supply | Qty: 25 | Fill #1

## 2020-02-07 MED FILL — ?METOPROLOL SUCC ER 50M TAB: 50 | 30 days supply | Qty: 30 | Fill #2

## 2020-02-07 MED FILL — VENLAFAXINE HCL ER 75 MG CA: 75 | 30 days supply | Qty: 30 | Fill #3

## 2020-02-07 MED FILL — AMLODIPINE BESYLATE 10 MG T: 10 | 30 days supply | Qty: 30 | Fill #11

## 2020-02-07 MED FILL — FENOFIBRATE 160 MG TABLET: 160 | 30 days supply | Qty: 30 | Fill #7

## 2020-02-08 MED FILL — NITROGLYCERIN 0.4 MG TAB SL: 0.4 | 15 days supply | Qty: 25 | Fill #2

## 2020-02-08 NOTE — Telephone Encounter (Signed)
Increase Isosorbide to 60 mg once daily. If symptoms continue, arrange earlier follow up.  Richardson Dopp, PA-C    02/08/2020 1:15 PM

## 2020-02-11 MED FILL — ?ROSUVASTATIN CALCIUM 40M T: 40 | 30 days supply | Qty: 30 | Fill #1

## 2020-02-14 NOTE — Interval H&P Note (Signed)
History and Physical Interval Note:  02/14/2020 8:28 AM  Monique Hunt  has presented today for surgery, with the diagnosis of unstable angina - chest pain.  The various methods of treatment have been discussed with the patient and family. After consideration of risks, benefits and other options for treatment, the patient has consented to  Procedure(s): LEFT HEART CATH AND CORONARY ANGIOGRAPHY (N/A) as a surgical intervention.  The patient's history has been reviewed, patient examined, no change in status, stable for surgery.  I have reviewed the patient's chart and labs.  Questions were answered to the patient's satisfaction.     Quay Burow  Agree with H & P as above for OP cath  Lorretta Harp, M.D., [facility]al[facility]ss[facility], [place], [place] [address] [place], [place]  [address]  [phone] 02/14/2020 8:29 AM

## 2020-02-19 NOTE — Telephone Encounter (Signed)
If feeling better, it is ok to cancel tomorrow and schedule a routine follow up in 3 mos with me or Dr. Harrington Challenger. Richardson Dopp, PA-C    02/19/2020 8:08 AM

## 2020-02-20 ENCOUNTER — Ambulatory Visit: Payer: Self-pay | Admitting: Physician Assistant

## 2020-02-22 MED FILL — LOSARTAN POTASSIUM 100 MG T: 100 | 30 days supply | Qty: 30 | Fill #6

## 2020-02-22 MED FILL — ISOSORBIDE MN ER 30 MG TAB: 30 | 30 days supply | Qty: 30 | Fill #2

## 2020-02-22 MED FILL — ?EZETIMIBE 10 MG TABLET: 10 | 30 days supply | Qty: 30 | Fill #4

## 2020-02-22 MED FILL — ?CLOPIDOGREL 75MG TA: 75 | 30 days supply | Qty: 30 | Fill #6

## 2020-03-03 ENCOUNTER — Ambulatory Visit: Payer: Self-pay | Attending: Internal Medicine | Admitting: Internal Medicine

## 2020-03-03 ENCOUNTER — Other Ambulatory Visit: Payer: Self-pay

## 2020-03-03 DIAGNOSIS — Z23 Encounter for immunization: Secondary | ICD-10-CM

## 2020-03-03 DIAGNOSIS — N289 Disorder of kidney and ureter, unspecified: Secondary | ICD-10-CM

## 2020-03-03 DIAGNOSIS — R911 Solitary pulmonary nodule: Secondary | ICD-10-CM

## 2020-03-03 DIAGNOSIS — I25118 Atherosclerotic heart disease of native coronary artery with other forms of angina pectoris: Secondary | ICD-10-CM

## 2020-03-03 DIAGNOSIS — E785 Hyperlipidemia, unspecified: Secondary | ICD-10-CM

## 2020-03-03 DIAGNOSIS — I10 Essential (primary) hypertension: Secondary | ICD-10-CM

## 2020-03-03 MED FILL — VENLAFAXINE HCL ER 75 MG CA: 75 | 30 days supply | Qty: 30 | Fill #4

## 2020-03-03 MED FILL — ?METOPROLOL SUCC ER 50M TAB: 50 | 30 days supply | Qty: 30 | Fill #3

## 2020-03-03 NOTE — Progress Notes (Signed)
Virtual Visit via Video Note  I connected with Monique Hunt on 03/03/20 at  1:30 PM EDT by a video enabled telemedicine application and verified that I am speaking with the correct person using two identifiers.   I discussed the limitations of evaluation and management by telemedicine and the availability of in person appointments. The patient expressed understanding and agreed to proceed.  History of Present Illness: Patient with history of HTN, CADone stent to LAD 11/2017, depression, HL, LT thyroid nodule (no further f/u needed per endocrinology Dr. Kelton Pillar), RT lung nodule  HYPERTENSION/CAD Currently taking: see medication list She has seen the cardiologist since last visit with me.  Isosorbide was added.  Lipitor was changed to Crestor and she is also on Praluent for better cholesterol control. Med Adherence: [x]  Yes    []  No Medication side effects: []  Yes    [x]  No Adherence with salt restriction: [x]  Yes    []  No Home Monitoring?: [x]  Yes    []  No Monitoring Frequency: []  Yes    []  No Home BP results range: She reports that her blood pressure has been good. SOB? []  Yes    [x]  No Chest Pain?: [x]  Yes.  But not as often.  Last chest pain episode was 1 month ago. Leg swelling?: []  Yes    [x]  No Headaches?: []  Yes    [x]  No Dizziness? []  Yes    [x]  No Comments:   Hypercalcemia: Noted on labs done on last visit with me.  TSH and parathyroid hormone levels were normal.  She is not on calcium supplement.  She was on hydrochlorothiazide but that was discontinued several weeks ago by the cardiologist.  Due to increase in creatinine and decrease in GFR.  Most recent creatinine was 1.23 and GFR of 48.  Lung nodule: I had referred her for repeat CAT scan of the chest on last visit for follow-up on this.  However patient has not had it done as yet.  She states that she wants to hold off until she has paid down some of her bills with [facility] Outpatient Encounter Medications as of  03/03/2020  Medication Sig   Alirocumab (PRALUENT) 75 MG/ML SOAJ Inject 1 pen into the skin every 14 (fourteen) days.   amLODipine (NORVASC) 10 MG tablet Take 1 tablet (10 mg total) by mouth daily.   aspirin 81 MG chewable tablet Chew 1 tablet (81 mg total) by mouth daily.   clopidogrel (PLAVIX) 75 MG tablet TAKE 1 TABLET BY MOUTH DAILY WITH BREAKFAST.   dicyclomine (BENTYL) 10 MG capsule Take 2 capsules (20 mg total) by mouth every 8 (eight) hours as needed for spasms. For abdominal cramps and diarrhea   esomeprazole (NEXIUM) 20 MG capsule Take 20 mg by mouth daily at 12 noon.   ezetimibe (ZETIA) 10 MG tablet Take 1 tablet (10 mg total) by mouth daily.   fenofibrate 160 MG tablet TAKE 1 TABLET BY MOUTH DAILY.   isosorbide mononitrate (IMDUR) 30 MG 24 hr tablet Take 1 tablet (30 mg total) by mouth daily.   losartan (COZAAR) 100 MG tablet TAKE 1 TABLET BY MOUTH DAILY.   metoprolol succinate (TOPROL-XL) 50 MG 24 hr tablet Take 1 tablet (50 mg total) by mouth daily.   nitroGLYCERIN (NITROSTAT) 0.4 MG SL tablet Place 1 tablet (0.4 mg total) under the tongue every 5 (five) minutes x 3 doses as needed for chest pain.   rosuvastatin (CRESTOR) 40 MG tablet Take 1 tablet (40 mg total) by mouth  daily.   venlafaxine XR (EFFEXOR-XR) 75 MG 24 hr capsule Take 1 capsule (75 mg total) by mouth daily with breakfast.   No facility-administered encounter medications on file as of 03/03/2020.      Observations/Objective: Results for orders placed or performed in visit on 01/15/20  Comprehensive metabolic panel  Result Value Ref Range   Glucose 96 65 - 99 mg/dL   BUN 33 (H) 6 - 24 mg/dL   Creatinine, Ser 1.23 (H) 0.57 - 1.00 mg/dL   GFR calc non Af Amer 48 (L) >59 mL/min/1.73   GFR calc Af Amer 55 (L) >59 mL/min/1.73   BUN/Creatinine Ratio 27 (H) 9 - 23   Sodium 138 134 - 144 mmol/L   Potassium 4.9 3.5 - 5.2 mmol/L   Chloride 102 96 - 106 mmol/L   CO2 21 20 - 29 mmol/L   Calcium 10.5 (H)  8.7 - 10.2 mg/dL   Total Protein 7.3 6.0 - 8.5 g/dL   Albumin 4.8 3.8 - 4.9 g/dL   Globulin, Total 2.5 1.5 - 4.5 g/dL   Albumin/Globulin Ratio 1.9 1.2 - 2.2   Bilirubin Total 0.3 0.0 - 1.2 mg/dL   Alkaline Phosphatase 47 (L) 48 - 121 IU/L   AST 28 0 - 40 IU/L   ALT 37 (H) 0 - 32 IU/L  Lipid panel  Result Value Ref Range   Cholesterol, Total 159 100 - 199 mg/dL   Triglycerides 140 0 - 149 mg/dL   HDL 48 >39 mg/dL   VLDL Cholesterol Cal 25 5 - 40 mg/dL   LDL Chol Calc (NIH) 86 0 - 99 mg/dL   Chol/HDL Ratio 3.3 0.0 - 4.4 ratio     Assessment and Plan: 1. Essential hypertension Blood pressure has been good with medication changes.  She will continue current medications and low-salt diet.  Currently on amlodipine, isosorbide, metoprolol.  2. Coronary artery disease of native artery of native heart with stable angina pectoris [facility]) Patient states that she is feeling much better since being on isosorbide.  She will continue the isosorbide, metoprolol, Crestor, Praluent and aspirin  3. Hypercalcemia Needs further work-up.  We will recheck chemistry to see whether the calcium has decreased with HCTZ being discontinued. - VITAMIN D 25 Hydroxy (Vit-D Deficiency, Fractures); Future - Vitamin D 1,25 dihydroxy; Future - PE and FLC, Serum; Future - Protein Electrophoresis, Urine Rflx.; Future  4. Renal insufficiency Avoid NSAIDs. - Comprehensive metabolic panel; Future  5. Lung nodule, solitary Encourage patient to renew her orange card/cone discount card.  Once renewed she will let me know so that we can reschedule the CAT scan of the chest for follow-up on the lung nodule.  6. Hyperlipidemia LDL goal <70 - Comprehensive metabolic panel; Future - Lipid panel; Future  7. Need for influenza vaccination Patient will come to have this given by our clinical pharmacist.   Follow Up Instructions: 3 mths   I discussed the assessment and treatment plan with the patient. The patient was  provided an opportunity to ask questions and all were answered. The patient agreed with the plan and demonstrated an understanding of the instructions.   The patient was advised to call back or seek an in-person evaluation if the symptoms worsen or if the condition fails to improve as anticipated.  I provided 12 minutes of non-face-to-face time during this encounter.   Karle Plumber, MD

## 2020-03-05 ENCOUNTER — Other Ambulatory Visit: Payer: Self-pay

## 2020-03-05 ENCOUNTER — Ambulatory Visit: Payer: Self-pay | Attending: Internal Medicine | Admitting: Pharmacist

## 2020-03-05 DIAGNOSIS — E785 Hyperlipidemia, unspecified: Secondary | ICD-10-CM

## 2020-03-05 DIAGNOSIS — N289 Disorder of kidney and ureter, unspecified: Secondary | ICD-10-CM

## 2020-03-05 DIAGNOSIS — Z23 Encounter for immunization: Secondary | ICD-10-CM

## 2020-03-05 NOTE — Progress Notes (Signed)
Patient presents for vaccination against Influenza per orders of Dr. Johnson. Consent given. Counseling provided. No contraindications exists. Vaccine administered without incident.  ° °Lurlene Ronda, PharmD °PGY2 Ambulatory Care Resident °[facility]  ° °

## 2020-03-11 ENCOUNTER — Other Ambulatory Visit: Payer: Self-pay | Admitting: Internal Medicine

## 2020-03-11 ENCOUNTER — Other Ambulatory Visit: Payer: Self-pay

## 2020-03-11 DIAGNOSIS — R739 Hyperglycemia, unspecified: Secondary | ICD-10-CM

## 2020-03-11 LAB — SPECIMEN STATUS REPORT

## 2020-03-14 LAB — LIPID PANEL
Chol/HDL Ratio: 1.6 ratio (ref 0.0–4.4)
Cholesterol, Total: 81 mg/dL — ABNORMAL LOW (ref 100–199)
HDL: 52 mg/dL (ref >39)
LDL Chol Calc (NIH): 11 mg/dL (ref 0–99)
Triglycerides: 93 mg/dL (ref 0–149)
VLDL Cholesterol Cal: 18 mg/dL (ref 5–40)

## 2020-03-14 LAB — VITAMIN D 1,25 DIHYDROXY
Vitamin D 1, 25 (OH)2 Total: 36 pg/mL
Vitamin D2 1, 25 (OH)2: <10 pg/mL
Vitamin D3 1, 25 (OH)2: 35 pg/mL

## 2020-03-14 LAB — PROTEIN ELECTROPHORESIS, URINE REFLEX
Albumin ELP, Urine: 57.4 %
Alpha-1-Globulin, U: 2.9 %
Alpha-2-Globulin, U: 9.2 %
Beta Globulin, U: 17.9 %
Gamma Globulin, U: 12.6 %
Protein, Ur: 20.6 mg/dL

## 2020-03-14 LAB — PE AND FLC, SERUM
A/G Ratio: 1.6 (ref 0.7–1.7)
Albumin ELP: 4.5 g/dL — ABNORMAL HIGH (ref 2.9–4.4)
Alpha 1: 0.2 g/dL (ref 0.0–0.4)
Alpha 2: 0.7 g/dL (ref 0.4–1.0)
Beta: 1 g/dL (ref 0.7–1.3)
Gamma Globulin: 1 g/dL (ref 0.4–1.8)
Globulin, Total: 2.9 g/dL (ref 2.2–3.9)
Ig Kappa Free Light Chain: 24.1 mg/L — ABNORMAL HIGH (ref 3.3–19.4)
Ig Lambda Free Light Chain: 14.7 mg/L (ref 5.7–26.3)
KAPPA/LAMBDA RATIO: 1.64 (ref 0.26–1.65)
Total Protein: 7.4 g/dL (ref 6.0–8.5)

## 2020-03-14 LAB — COMPREHENSIVE METABOLIC PANEL
ALT: 36 IU/L — ABNORMAL HIGH (ref 0–32)
AST: 29 IU/L (ref 0–40)
Albumin/Globulin Ratio: 2 (ref 1.2–2.2)
Albumin: 4.9 g/dL (ref 3.8–4.9)
Alkaline Phosphatase: 53 IU/L (ref 44–121)
BUN/Creatinine Ratio: 25 (ref 12–28)
BUN: 23 mg/dL (ref 8–27)
Bilirubin Total: 0.3 mg/dL (ref 0.0–1.2)
CO2: 25 mmol/L (ref 20–29)
Calcium: 9.8 mg/dL (ref 8.7–10.3)
Chloride: 103 mmol/L (ref 96–106)
Creatinine, Ser: 0.93 mg/dL (ref 0.57–1.00)
GFR calc Af Amer: 77 mL/min/{1.73_m2} (ref >59)
GFR calc non Af Amer: 67 mL/min/{1.73_m2} (ref >59)
Globulin, Total: 2.5 g/dL (ref 1.5–4.5)
Glucose: 138 mg/dL — ABNORMAL HIGH (ref 65–99)
Potassium: 4.4 mmol/L (ref 3.5–5.2)
Sodium: 141 mmol/L (ref 134–144)

## 2020-03-14 LAB — VITAMIN D 25 HYDROXY (VIT D DEFICIENCY, FRACTURES): Vit D, 25-Hydroxy: 29.7 ng/mL — ABNORMAL LOW (ref 30.0–100.0)

## 2020-03-17 MED FILL — FENOFIBRATE 160 MG TABLET: 160 | 30 days supply | Qty: 30 | Fill #8

## 2020-03-17 MED FILL — LOSARTAN POTASSIUM 100 MG T: 100 | 30 days supply | Qty: 30 | Fill #7

## 2020-03-17 MED FILL — ?CLOPIDOGREL 75MG TA: 75 | 30 days supply | Qty: 30 | Fill #7

## 2020-03-17 MED FILL — EZETIMIBE 10 MG TABS: 10 | 30 days supply | Qty: 30 | Fill #5

## 2020-03-17 MED FILL — ?ROSUVASTATIN CALCIUM 40M T: 40 | 30 days supply | Qty: 30 | Fill #2

## 2020-03-18 MED FILL — ISOSORBIDE MN ER 30 MG TAB: 30 | 30 days supply | Qty: 30 | Fill #3

## 2020-03-19 ENCOUNTER — Other Ambulatory Visit: Payer: Self-pay | Admitting: Internal Medicine

## 2020-03-19 DIAGNOSIS — I10 Essential (primary) hypertension: Secondary | ICD-10-CM

## 2020-03-20 MED FILL — AMLODIPINE BESYLATE 10 MG T: 10 | 30 days supply | Qty: 30 | Fill #0

## 2020-03-31 MED FILL — ?METOPROLOL SUCC ER 50M TAB: 50 | 30 days supply | Qty: 30 | Fill #4

## 2020-03-31 MED FILL — VENLAFAXINE HCL ER 75 MG CA: 75 | 30 days supply | Qty: 30 | Fill #5

## 2020-04-01 ENCOUNTER — Ambulatory Visit: Payer: Self-pay

## 2020-04-04 ENCOUNTER — Other Ambulatory Visit: Payer: Self-pay

## 2020-04-04 ENCOUNTER — Ambulatory Visit: Payer: Self-pay | Attending: Internal Medicine

## 2020-04-07 ENCOUNTER — Other Ambulatory Visit: Payer: Self-pay | Admitting: Physician Assistant

## 2020-04-07 MED ORDER — ISOSORBIDE MONONITRATE ER 60 MG PO TB24
60.0000 mg | ORAL_TABLET | Freq: Every day | ORAL | 1 refills | Status: DC
Start: 2020-04-07 — End: 2020-05-21

## 2020-04-07 MED FILL — ISOSORBIDE MN ER 60 MG TAB: 60 | 30 days supply | Qty: 30 | Fill #0

## 2020-04-10 MED FILL — ?EZETIMIBE 10MG TABLET: 10MG | 30 days supply | Qty: 30 | Fill #6

## 2020-04-10 MED FILL — ?ROSUVASTATIN CALCIUM 40M T: 40 | 30 days supply | Qty: 30 | Fill #3

## 2020-04-10 MED FILL — ISOSORBIDE MN ER 30 MG TAB: 30 | 30 days supply | Qty: 30 | Fill #4

## 2020-04-10 MED FILL — ?CLOPIDOGREL 75MG TA: 75 | 30 days supply | Qty: 30 | Fill #8

## 2020-04-10 MED FILL — LOSARTAN POTASSIUM 100 MG T: 100 | 30 days supply | Qty: 30 | Fill #8

## 2020-04-18 ENCOUNTER — Other Ambulatory Visit: Payer: Self-pay | Admitting: *Deleted

## 2020-04-18 ENCOUNTER — Other Ambulatory Visit: Payer: Self-pay

## 2020-04-18 DIAGNOSIS — E7800 Pure hypercholesterolemia, unspecified: Secondary | ICD-10-CM

## 2020-04-18 DIAGNOSIS — I25119 Atherosclerotic heart disease of native coronary artery with unspecified angina pectoris: Secondary | ICD-10-CM

## 2020-04-18 LAB — HEPATIC FUNCTION PANEL
ALT: 28 IU/L (ref 0–32)
AST: 31 IU/L (ref 0–40)
Albumin: 4.6 g/dL (ref 3.8–4.9)
Alkaline Phosphatase: 58 IU/L (ref 44–121)
Bilirubin Total: 0.2 mg/dL (ref 0.0–1.2)
Bilirubin, Direct: 0.1 mg/dL (ref 0.00–0.40)
Total Protein: 7.3 g/dL (ref 6.0–8.5)

## 2020-04-18 LAB — LIPID PANEL
Chol/HDL Ratio: 1.5 ratio (ref 0.0–4.4)
Cholesterol, Total: 96 mg/dL — ABNORMAL LOW (ref 100–199)
HDL: 66 mg/dL (ref >39)
LDL Chol Calc (NIH): 14 mg/dL (ref 0–99)
Triglycerides: 79 mg/dL (ref 0–149)
VLDL Cholesterol Cal: 16 mg/dL (ref 5–40)

## 2020-04-20 ENCOUNTER — Other Ambulatory Visit: Payer: Self-pay | Admitting: Internal Medicine

## 2020-04-20 DIAGNOSIS — I10 Essential (primary) hypertension: Secondary | ICD-10-CM

## 2020-04-21 ENCOUNTER — Other Ambulatory Visit: Payer: Self-pay | Admitting: Critical Care Medicine

## 2020-04-21 MED ORDER — AMLODIPINE BESYLATE 10 MG PO TABS: 10.0000 mg | ORAL_TABLET | Freq: Every day | ORAL | 0 refills | Status: DC

## 2020-04-28 ENCOUNTER — Other Ambulatory Visit: Payer: Self-pay | Admitting: Internal Medicine

## 2020-04-28 DIAGNOSIS — I10 Essential (primary) hypertension: Secondary | ICD-10-CM

## 2020-04-28 MED FILL — METOPROLOL SUCCINATE ER 50: 50 | 30 days supply | Qty: 30 | Fill #5

## 2020-04-28 MED FILL — VENLAFAXINE HCL ER 75 MG CA: 75 | 30 days supply | Qty: 30 | Fill #6

## 2020-05-01 MED FILL — AMLODIPINE BESYLATE 10 MG T: 10 | 30 days supply | Qty: 30 | Fill #0

## 2020-05-01 MED FILL — ISOSORBIDE MN ER 60 MG TAB: 60 | 30 days supply | Qty: 30 | Fill #1

## 2020-05-02 ENCOUNTER — Other Ambulatory Visit: Payer: Self-pay | Admitting: Critical Care Medicine

## 2020-05-02 ENCOUNTER — Other Ambulatory Visit: Payer: Self-pay | Admitting: Internal Medicine

## 2020-05-02 DIAGNOSIS — I10 Essential (primary) hypertension: Secondary | ICD-10-CM

## 2020-05-02 MED FILL — FENOFIBRATE 160 MG TABLET: 160 | 30 days supply | Qty: 30 | Fill #0

## 2020-05-12 ENCOUNTER — Other Ambulatory Visit: Payer: Self-pay | Admitting: Gastroenterology

## 2020-05-12 ENCOUNTER — Other Ambulatory Visit: Payer: Self-pay | Admitting: Internal Medicine

## 2020-05-12 MED FILL — ?EZETIMIBE 10MG TABLET: 10MG | 30 days supply | Qty: 30 | Fill #7

## 2020-05-12 MED FILL — ?ROSUVASTATIN CALCIUM 40M T: 40 | 30 days supply | Qty: 30 | Fill #4

## 2020-05-13 ENCOUNTER — Other Ambulatory Visit: Payer: Self-pay | Admitting: Gastroenterology

## 2020-05-13 MED FILL — DICYCLOMINE 10 MG CAPSULE: 10 | 7 days supply | Qty: 45 | Fill #0

## 2020-05-13 NOTE — Telephone Encounter (Signed)
Monique Hunt,  I refilled this prescription.  Please contact the patient and arrange a clinic appointment with me since she was last seen a year ago.  - HD

## 2020-05-14 ENCOUNTER — Other Ambulatory Visit: Payer: Self-pay | Admitting: Internal Medicine

## 2020-05-14 MED FILL — LOSARTAN POTASSIUM 100 MG T: 100 | 30 days supply | Qty: 30 | Fill #0

## 2020-05-14 MED FILL — ?CLOPIDOGREL 75MG TA: 75 | 30 days supply | Qty: 30 | Fill #0

## 2020-05-20 NOTE — Progress Notes (Signed)
Cardiology Office Note:    Date:  05/21/2020   ID:  Monique Hunt, DOB 20-Mar-1961, MRN [id number]  PCP:  Ladell Pier, MD  [facility] Cardiologist:  Dorris Carnes, MD   [facility] Electrophysiologist:  None   Referring MD: Ladell Pier, MD   Chief Complaint:  Follow-up (CAD, angina)    Patient Profile:    Monique Hunt is a 59 y.o. female with:   Coronary artery disease w/ angina ? S/p NSTEMI 7/19 >> PCI: DES to pLAD ? ETT-Echocardiogram 07/2018: no ischemia ? Cath 01/04/23:  oLAD 40, mLAD stent patent ? Rx with Isosorbide   Hypertension   Hyperlipidemia    Prior CV studies: Cardiac catheterization 01-04-2020 LAD ostial 40, mid stent patent EF 55-65  Stress echocardiogram 07/10/2018 No echocardiographic evidence for stress-induced ischemia No LV outflow tract obstruction at baseline or after exercise  Cardiac catheterization 12/16/2017 LAD proximal 75 EF 55-65 12/19/2017: PCI -3.5 x 12 mm Synergy DES to the LAD  History of Present Illness:    Monique Hunt was last seen in 8/21.  She was still having exertional shortness of breath. I placed her on isosorbide with improvement in her symptoms.  She has had to increase the dose of Isosorbide on one occasion.  She returns for f/u.    She is here alone.  She notes that she feels much better than she has felt since her heart attack in 2019.  She still has occasional episodes of shortness of breath.  For the most part, she takes isosorbide 30 mg daily.  If she has more shortness of breath, she takes 30 mg twice daily.  She does not have to do this often.  She has not had significant chest discomfort.  She has not had orthopnea, leg swelling or syncope.  She has never smoked.  She does not have a history of asthma.      Past Medical History:  Diagnosis Date  . Coronary artery disease   . Depression   . History of kidney stones   . Hyperlipidemia   . Hypertension   . Migraine    "stopped ~  2010" (12/15/2017)  . NSTEMI (non-ST elevated myocardial infarction) ([place]) 12/15/2017    Current Medications: Current Meds  Medication Sig  . Alirocumab (PRALUENT) 75 MG/ML SOAJ Inject 1 pen into the skin every 14 (fourteen) days.  Marland Kitchen amLODipine (NORVASC) 10 MG tablet TAKE 1 TABLET (10 MG TOTAL) BY MOUTH DAILY.  Marland Kitchen aspirin 81 MG chewable tablet Chew 1 tablet (81 mg total) by mouth daily.  Marland Kitchen dicyclomine (BENTYL) 10 MG capsule TAKE 2 CAPSULES (20 MG TOTAL) BY MOUTH EVERY 8 (EIGHT) HOURS AS NEEDED FOR SPASMS. FOR ABDOMINAL CRAMPS AND DIARRHEA  . esomeprazole (NEXIUM) 20 MG capsule Take 20 mg by mouth daily at 12 noon.  . ezetimibe (ZETIA) 10 MG tablet Take 1 tablet (10 mg total) by mouth daily.  . fenofibrate 160 MG tablet TAKE 1 TABLET BY MOUTH DAILY.  . isosorbide dinitrate (ISORDIL) 30 MG tablet Take 30 mg by mouth daily.  Marland Kitchen losartan (COZAAR) 100 MG tablet TAKE 1 TABLET BY MOUTH DAILY.  . metoprolol succinate (TOPROL-XL) 50 MG 24 hr tablet Take 1 tablet (50 mg total) by mouth daily.  . nitroGLYCERIN (NITROSTAT) 0.4 MG SL tablet Place 1 tablet (0.4 mg total) under the tongue every 5 (five) minutes x 3 doses as needed for chest pain.  . rosuvastatin (CRESTOR) 40 MG tablet Take 1 tablet (40 mg total)  by mouth daily.  Marland Kitchen venlafaxine XR (EFFEXOR-XR) 75 MG 24 hr capsule Take 1 capsule (75 mg total) by mouth daily with breakfast.  . [DISCONTINUED] clopidogrel (PLAVIX) 75 MG tablet TAKE 1 TABLET BY MOUTH DAILY WITH BREAKFAST.     Allergies:   Patient has no known allergies.   Social History   Tobacco Use  . Smoking status: Never Smoker  . Smokeless tobacco: Never Used  Vaping Use  . Vaping Use: Never used  Substance Use Topics  . Alcohol use: Yes    Comment: 12/15/2017 "couple mixed drinks/month"  . Drug use: Never     Family Hx: The patient's family history includes Breast cancer in her mother; CAD in her father; Heart disease in her father; Hyperlipidemia in her brother and sister. There  is no history of Colon cancer, Esophageal cancer, Rectal cancer, or Stomach cancer.  ROS   EKGs/Labs/Other Test Reviewed:    EKG:  EKG is not ordered today.  The ekg ordered today demonstrates n/a  Recent Labs: 11/05/2019: TSH 0.622 12/18/2019: Hemoglobin 12.7; Platelets 451 03/05/2020: BUN 23; Creatinine, Ser 0.93; Potassium 4.4; Sodium 141 04/18/2020: ALT 28   Recent Lipid Panel Lab Results  Component Value Date/Time   CHOL 96 (L) 04/18/2020 08:30 AM   TRIG 79 04/18/2020 08:30 AM   HDL 66 04/18/2020 08:30 AM   CHOLHDL 1.5 04/18/2020 08:30 AM   CHOLHDL 7.2 12/16/2017 07:14 AM   LDLCALC 14 04/18/2020 08:30 AM      Risk Assessment/Calculations:      Physical Exam:    VS:  BP 112/64   Pulse 87   Ht 5\' 5"  (1.651 m)   Wt 147 lb 3.2 oz (66.8 kg)   LMP 02/28/2011   SpO2 97%   BMI 24.50 kg/m     Wt Readings from Last 3 Encounters:  05/21/20 147 lb 3.2 oz (66.8 kg)  01/02/20 150 lb 12.8 oz (68.4 kg)  12/19/19 150 lb (68 kg)     Constitutional:      Appearance: Healthy appearance. Not in distress.  Neck:     Vascular: JVD normal.  Pulmonary:     Effort: Pulmonary effort is normal.     Breath sounds: No wheezing. No rales.  Cardiovascular:     Normal rate. Regular rhythm. Normal S1. Normal S2.     Murmurs: There is no murmur.  Edema:    Peripheral edema absent.  Abdominal:     Palpations: Abdomen is soft. There is no hepatomegaly.  Skin:    General: Skin is warm and dry.  Neurological:     Mental Status: Alert and oriented to person, place and time.     Cranial Nerves: Cranial nerves are intact.       ASSESSMENT & PLAN:    1. Coronary artery disease involving native coronary artery of native heart with angina pectoris [facility]) History of non-STEMI in July 2019 treated with a drug-eluting stent to the LAD.  Recent cardiac catheterization demonstrated patent LAD stent.  Overall, she has noted improved symptoms with isosorbide.  She notes that she feels better than  she has felt in quite some time.  She is more than 1 year out from her myocardial infarction.  Therefore, she can stop taking clopidogrel.  Continue current dose of aspirin, Alirocumab, amlodipine, ezetimibe, isosorbide, losartan, metoprolol succinate, rosuvastatin.  Follow-up in 6 months.  2. Essential hypertension The patient's blood pressure is controlled on her current regimen.  Continue current therapy.   3. Hypercholesterolemia LDL  optimal on most recent lab work.  Continue current Rx.    4. Shortness of breath As noted, her shortness of breath seems to be an anginal equivalent.  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  However, I have recommended obtaining a BMET, BNP today.  If her BNP is elevated, I will place her on diuretic therapy and obtain an echocardiogram.    Dispo:  Return in about 6 months (around 11/19/2020) for Routine Follow Up, w/ Dr. Harrington Challenger, or Richardson Dopp, PA-C, in person.   Medication Adjustments/Labs and Tests Ordered: Current medicines are reviewed at length with the patient today.  Concerns regarding medicines are outlined above.  Tests Ordered: Orders Placed This Encounter  Procedures  . Basic metabolic panel  . Pro b natriuretic peptide (BNP)   Medication Changes: No orders of the defined types were placed in this encounter.   Signed, Richardson Dopp, PA-C  05/21/2020 8:45 AM    [facility] [place], [place], [place]  [address] Phone: [phone]; Fax: [phone]

## 2020-05-21 ENCOUNTER — Encounter: Payer: Self-pay | Admitting: Physician Assistant

## 2020-05-21 ENCOUNTER — Other Ambulatory Visit: Payer: Self-pay

## 2020-05-21 ENCOUNTER — Ambulatory Visit (INDEPENDENT_AMBULATORY_CARE_PROVIDER_SITE_OTHER): Payer: Self-pay | Admitting: Physician Assistant

## 2020-05-21 VITALS — BP 112/64 | HR 87 | Ht 65.0 in | Wt 147.2 lb

## 2020-05-21 DIAGNOSIS — E7800 Pure hypercholesterolemia, unspecified: Secondary | ICD-10-CM

## 2020-05-21 DIAGNOSIS — I10 Essential (primary) hypertension: Secondary | ICD-10-CM

## 2020-05-21 DIAGNOSIS — R0602 Shortness of breath: Secondary | ICD-10-CM

## 2020-05-21 DIAGNOSIS — I25119 Atherosclerotic heart disease of native coronary artery with unspecified angina pectoris: Secondary | ICD-10-CM

## 2020-05-21 LAB — BASIC METABOLIC PANEL
BUN/Creatinine Ratio: 29 — ABNORMAL HIGH (ref 12–28)
BUN: 20 mg/dL (ref 8–27)
CO2: 23 mmol/L (ref 20–29)
Calcium: 9.6 mg/dL (ref 8.7–10.3)
Chloride: 104 mmol/L (ref 96–106)
Creatinine, Ser: 0.7 mg/dL (ref 0.57–1.00)
GFR calc Af Amer: 109 mL/min/{1.73_m2} (ref >59)
GFR calc non Af Amer: 94 mL/min/{1.73_m2} (ref >59)
Glucose: 85 mg/dL (ref 65–99)
Potassium: 4.3 mmol/L (ref 3.5–5.2)
Sodium: 141 mmol/L (ref 134–144)

## 2020-05-21 LAB — PRO B NATRIURETIC PEPTIDE: NT-Pro BNP: 62 pg/mL (ref 0–287)

## 2020-05-21 NOTE — Patient Instructions (Signed)
Medication Instructions:  Your physician has recommended you make the following change in your medication:   1) Finish Plavix and then stop the medication  *If you need a refill on your cardiac medications before your next appointment, please call your pharmacy*  Lab Work: You will have labs drawn today: BMET/BNP  If you have labs (blood work) drawn today and your tests are completely normal, you will receive your results only by: Marland Kitchen MyChart Message (if you have MyChart) OR . A paper copy in the mail If you have any lab test that is abnormal or we need to change your treatment, we will call you to review the results.  Testing/Procedures: None ordered today  Follow-Up: At [facility], you and your health needs are our priority.  As part of our continuing mission to provide you with exceptional heart care, we have created designated Provider Care Teams.  These Care Teams include your primary Cardiologist (physician) and Advanced Practice Providers (APPs -  Physician Assistants and Nurse Practitioners) who all work together to provide you with the care you need, when you need it.  Your next appointment:   6 month(s)  The format for your next appointment:   In Person  Provider:   You may see Dorris Carnes, MD or Richardson Dopp, PA-C

## 2020-06-02 ENCOUNTER — Other Ambulatory Visit: Payer: Self-pay | Admitting: Critical Care Medicine

## 2020-06-02 ENCOUNTER — Other Ambulatory Visit: Payer: Self-pay | Admitting: Internal Medicine

## 2020-06-02 ENCOUNTER — Other Ambulatory Visit: Payer: Self-pay | Admitting: Physician Assistant

## 2020-06-02 DIAGNOSIS — F3342 Major depressive disorder, recurrent, in full remission: Secondary | ICD-10-CM

## 2020-06-02 DIAGNOSIS — I10 Essential (primary) hypertension: Secondary | ICD-10-CM

## 2020-06-02 MED FILL — VENLAFAXINE HCL ER 75 MG CA: 75 | 30 days supply | Qty: 30 | Fill #0

## 2020-06-02 MED FILL — AMLODIPINE BESYLATE 10 MG T: 10 | 30 days supply | Qty: 30 | Fill #0

## 2020-06-02 MED FILL — ISOSORBIDE MN ER 60 MG TAB: 60 | 30 days supply | Qty: 30 | Fill #2

## 2020-06-02 MED FILL — METOPROLOL SUCCINATE ER 50: 50 | 30 days supply | Qty: 30 | Fill #0

## 2020-06-05 ENCOUNTER — Ambulatory Visit: Payer: Self-pay | Admitting: Internal Medicine

## 2020-06-09 MED FILL — LOSARTAN POTASSIUM 100 MG T: 100 | 30 days supply | Qty: 30 | Fill #1

## 2020-06-09 MED FILL — ?EZETIMIBE 10MG TABLET: 10MG | 30 days supply | Qty: 30 | Fill #8

## 2020-06-09 MED FILL — ?CLOPIDOGREL 75MG TA: 75 | 30 days supply | Qty: 30 | Fill #1

## 2020-06-09 MED FILL — ?ROSUVASTATIN CALCIUM 40M T: 40 | 30 days supply | Qty: 30 | Fill #5

## 2020-06-18 ENCOUNTER — Telehealth: Payer: Self-pay | Admitting: Pharmacist

## 2020-06-18 NOTE — Telephone Encounter (Signed)
Called pt and left message to discuss Praluent pt assistance. She applied for PASS program but will not be covered until she has spent at least $500 out of pocket on meds.  Instead, will plan to apply for [employer]. Income is $20,000 for household of 1. Will need pt's social security # to apply for grant.

## 2020-06-20 NOTE — Telephone Encounter (Signed)
Spoke with pt, she is actually uninsured so we cannot pursue [employer]. Since Praluent pt assistance requires pt to have spent $500 out of pocket on meds and Repatha does not, will instead submit patient assistance for Repatha.

## 2020-06-30 MED FILL — DICYCLOMINE 10 MG CAPSULE: 10 | 7 days supply | Qty: 45 | Fill #1

## 2020-06-30 MED FILL — ISOSORBIDE MN ER 60 MG TAB: 60 | 30 days supply | Qty: 30 | Fill #3

## 2020-06-30 MED FILL — METOPROLOL SUCCINATE ER 50: 50 | 30 days supply | Qty: 30 | Fill #1

## 2020-07-02 ENCOUNTER — Ambulatory Visit: Payer: Self-pay | Admitting: Gastroenterology

## 2020-07-03 MED FILL — ?CLOPIDOGREL 75 MG TABL: 75 | 30 days supply | Qty: 30 | Fill #2

## 2020-07-03 MED FILL — ?EZETIMIBE 10MG TABLET: 10MG | 30 days supply | Qty: 30 | Fill #9

## 2020-07-03 MED FILL — LOSARTAN POTASSIUM 100 MG T: 100 | 30 days supply | Qty: 30 | Fill #2

## 2020-07-10 ENCOUNTER — Other Ambulatory Visit: Payer: Self-pay

## 2020-07-10 ENCOUNTER — Other Ambulatory Visit: Payer: Self-pay | Admitting: Internal Medicine

## 2020-07-10 DIAGNOSIS — I10 Essential (primary) hypertension: Secondary | ICD-10-CM

## 2020-07-10 DIAGNOSIS — F3342 Major depressive disorder, recurrent, in full remission: Secondary | ICD-10-CM

## 2020-07-10 MED ORDER — VENLAFAXINE HCL ER 75 MG PO CP24: 75.0000 mg | ORAL_CAPSULE | Freq: Every day | ORAL | 0 refills | Status: DC

## 2020-07-10 MED ORDER — AMLODIPINE BESYLATE 10 MG PO TABS: 10.0000 mg | ORAL_TABLET | Freq: Every day | ORAL | 0 refills | Status: DC

## 2020-07-11 ENCOUNTER — Telehealth: Payer: Self-pay | Admitting: Pharmacist

## 2020-07-11 DIAGNOSIS — E7800 Pure hypercholesterolemia, unspecified: Secondary | ICD-10-CM

## 2020-07-11 MED FILL — VENLAFAXINE HCL ER 75 MG CA: 75 | 30 days supply | Qty: 30 | Fill #0

## 2020-07-11 MED FILL — AMLODIPINE BESYLATE 10 MG T: 10 | 30 days supply | Qty: 30 | Fill #0

## 2020-07-11 NOTE — Telephone Encounter (Addendum)
Received notification from [employer] that pt was approved to receive free Repatha from the manufacturer through 07/10/21. Called pt advising her of the approval, she states they reached out to her yesterday to coordinate shipment of medication. She also states she was advised to stop fenofibrate about 2 months ago by another provider. Didn't have any issues tolerating it. TG have been well controlled < 100 but a few years ago were in the 400s. Can keep an eye on f/u TG and resume fenofibrate in the future if needed. She is still taking rosuvastatin and ezetimibe. Is familiar with injection technique since she previously took [place] (Repatha now covered better for uninsured patients). Scheduled f/u labs in 2 months to assess efficacy. Can potentially stop ezetimibe pending f/u LDL level.

## 2020-07-25 MED FILL — METOPROLOL SUCCINATE ER 50: 50 | 30 days supply | Qty: 30 | Fill #2

## 2020-07-25 MED FILL — ISOSORBIDE MN ER 60 MG TAB: 60 | 30 days supply | Qty: 30 | Fill #4

## 2020-07-29 MED FILL — ?EZETIMIBE 10MG TABLET: 10MG | 30 days supply | Qty: 30 | Fill #10

## 2020-07-29 MED FILL — LOSARTAN POTASSIUM 100 MG T: 100 | 30 days supply | Qty: 30 | Fill #3

## 2020-07-29 MED FILL — ?CLOPIDOGREL 75MG TABL: 75 | 30 days supply | Qty: 30 | Fill #3

## 2020-08-06 ENCOUNTER — Emergency Department (HOSPITAL_COMMUNITY)
Admission: EM | Admit: 2020-08-06 | Discharge: 2020-08-06 | Disposition: A | Discharge status: Home / Self Care | Payer: Self-pay | Attending: Emergency Medicine | Admitting: Emergency Medicine

## 2020-08-06 ENCOUNTER — Emergency Department (HOSPITAL_COMMUNITY): Payer: Self-pay

## 2020-08-06 ENCOUNTER — Other Ambulatory Visit: Payer: Self-pay

## 2020-08-06 ENCOUNTER — Encounter (HOSPITAL_COMMUNITY): Payer: Self-pay | Admitting: Emergency Medicine

## 2020-08-06 DIAGNOSIS — R600 Localized edema: Secondary | ICD-10-CM | POA: Insufficient documentation

## 2020-08-06 DIAGNOSIS — Z20822 Contact with and (suspected) exposure to covid-19: Secondary | ICD-10-CM | POA: Insufficient documentation

## 2020-08-06 DIAGNOSIS — Z79899 Other long term (current) drug therapy: Secondary | ICD-10-CM | POA: Insufficient documentation

## 2020-08-06 DIAGNOSIS — R0902 Hypoxemia: Secondary | ICD-10-CM | POA: Insufficient documentation

## 2020-08-06 DIAGNOSIS — Z955 Presence of coronary angioplasty implant and graft: Secondary | ICD-10-CM | POA: Insufficient documentation

## 2020-08-06 DIAGNOSIS — I25119 Atherosclerotic heart disease of native coronary artery with unspecified angina pectoris: Secondary | ICD-10-CM

## 2020-08-06 DIAGNOSIS — R079 Chest pain, unspecified: Secondary | ICD-10-CM

## 2020-08-06 DIAGNOSIS — Z7982 Long term (current) use of aspirin: Secondary | ICD-10-CM | POA: Insufficient documentation

## 2020-08-06 DIAGNOSIS — I10 Essential (primary) hypertension: Secondary | ICD-10-CM | POA: Insufficient documentation

## 2020-08-06 DIAGNOSIS — M549 Dorsalgia, unspecified: Secondary | ICD-10-CM | POA: Insufficient documentation

## 2020-08-06 DIAGNOSIS — R0602 Shortness of breath: Secondary | ICD-10-CM | POA: Insufficient documentation

## 2020-08-06 DIAGNOSIS — R000 Tachycardia, unspecified: Secondary | ICD-10-CM | POA: Insufficient documentation

## 2020-08-06 DIAGNOSIS — R112 Nausea with vomiting, unspecified: Secondary | ICD-10-CM | POA: Insufficient documentation

## 2020-08-06 DIAGNOSIS — J029 Acute pharyngitis, unspecified: Secondary | ICD-10-CM | POA: Insufficient documentation

## 2020-08-06 LAB — BASIC METABOLIC PANEL
Anion gap: 10 (ref 5–15)
BUN: 22 mg/dL — ABNORMAL HIGH (ref 6–20)
CO2: 24 mmol/L (ref 22–32)
Calcium: 10.2 mg/dL (ref 8.9–10.3)
Chloride: 103 mmol/L (ref 98–111)
Creatinine, Ser: 0.72 mg/dL (ref 0.44–1.00)
GFR, Estimated: >60 mL/min (ref >60)
Glucose, Bld: 108 mg/dL — ABNORMAL HIGH (ref 70–99)
Potassium: 4 mmol/L (ref 3.5–5.1)
Sodium: 137 mmol/L (ref 135–145)

## 2020-08-06 LAB — CBC
HCT: 41.6 % (ref 36.0–46.0)
Hemoglobin: 13.5 g/dL (ref 12.0–15.0)
MCH: 30.1 pg (ref 26.0–34.0)
MCHC: 32.5 g/dL (ref 30.0–36.0)
MCV: 92.7 fL (ref 80.0–100.0)
Platelets: 377 10*3/uL (ref 150–400)
RBC: 4.49 MIL/uL (ref 3.87–5.11)
RDW: 13.7 % (ref 11.5–15.5)
WBC: 8.3 10*3/uL (ref 4.0–10.5)
nRBC: 0 % (ref 0.0–0.2)

## 2020-08-06 LAB — D-DIMER, QUANTITATIVE: D-Dimer, Quant: 0.38 ug/mL-FEU (ref 0.00–0.50)

## 2020-08-06 LAB — TROPONIN I (HIGH SENSITIVITY)
Troponin I (High Sensitivity): 5 ng/L (ref <18)
Troponin I (High Sensitivity): 5 ng/L (ref <18)

## 2020-08-06 LAB — BRAIN NATRIURETIC PEPTIDE: B Natriuretic Peptide: 22.2 pg/mL (ref 0.0–100.0)

## 2020-08-06 LAB — SARS CORONAVIRUS 2 (TAT 6-24 HRS): SARS Coronavirus 2: NEGATIVE

## 2020-08-06 LAB — POC SARS CORONAVIRUS 2 AG -  ED: SARS Coronavirus 2 Ag: NEGATIVE

## 2020-08-06 MED ORDER — IOHEXOL 300 MG/ML  SOLN
75.0000 mL | Freq: Once | INTRAMUSCULAR | Status: AC | PRN
  Administered 2020-08-06: 75 mL via INTRAVENOUS

## 2020-08-06 NOTE — ED Triage Notes (Addendum)
Patient here with complaint of sore throat, shortness of breath, and chest pain that started approximately one week ago. Patient received two doses of Pfizer COVID-19 vaccine, has not received booster. Patient denies cough, denies fever at home. States pain is not exacerbated or improved by any activity. Afebrile in triage. Patient alert, oriented, and in no apparent distress at this time.

## 2020-08-06 NOTE — Discharge Instructions (Signed)
Please go to your primary care provider's office tomorrow.  You need to have your oxygen saturation checked.  If you feel worse anytime in between now and then, come back immediately.  If you are unable to seek care at your primary care provider's office, please return to the ER or go to an urgent care.

## 2020-08-06 NOTE — ED Notes (Signed)
Pt ambulated 57ft without shortness of breath, chest pain or dizziness. O2 saturation 99% the whole time

## 2020-08-06 NOTE — ED Provider Notes (Signed)
[facility] EMERGENCY DEPARTMENT Provider Note   CSN: [id number] Arrival date & time: 08/06/20  0935     History Chief Complaint  Patient presents with  . Chest Pain    Monique Hunt is a 60 y.o. female.  Patient states that she also has had a sore throat for about a day.  She denies any sick contacts.  The history is provided by the patient.  Chest Pain Chest pain location: between shoulder blades which is her chest pain equivalent. Pain quality: sharp   Pain radiates to:  Does not radiate Pain severity:  Severe Onset quality:  Sudden Duration:  10 minutes Timing:  Constant Progression:  Resolved Chronicity:  Recurrent (Has been having symptoms intermittently for a week or so) Context comment:  While in the shower Relieved by:  Nothing Worsened by:  Nothing Ineffective treatments:  None tried Associated symptoms: back pain, lower extremity edema (states this is constant from working on her feet), nausea, shortness of breath and vomiting   Associated symptoms: no abdominal pain, no cough, no fever and no palpitations   Risk factors: coronary artery disease, high cholesterol and hypertension        Past Medical History:  Diagnosis Date  . Coronary artery disease   . Depression   . History of kidney stones   . Hyperlipidemia   . Hypertension   . Migraine    "stopped ~ 2010" (12/15/2017)  . NSTEMI (non-ST elevated myocardial infarction) ([place]) 12/15/2017    Patient Active Problem List   Diagnosis Date Noted  . Low TSH level 08/04/2018  . Chronic diarrhea 06/05/2018  . Left thyroid nodule 04/07/2018  . Lung nodule, solitary 04/07/2018  . CAD (coronary artery disease), native coronary artery 12/19/2017  . Hyperlipidemia LDL goal <70   . NSTEMI (non-ST elevated myocardial infarction) ([place]) 12/15/2017  . Hypertension 12/01/2017  . Depression 06/04/2012  . Nephrolithiasis 09/27/2011    Past Surgical History:  Procedure Laterality Date   . CORONARY STENT INTERVENTION N/A 12/19/2017   Procedure: CORONARY STENT INTERVENTION;  Surgeon: Belva Crome, MD;  Location: [facility];  Service: Cardiovascular;  Laterality: N/A;  . CYSTOSCOPY W/ STONE MANIPULATION    . FACIAL FRACTURE SURGERY  1979  . LEFT HEART CATH AND CORONARY ANGIOGRAPHY N/A 12/16/2017   Procedure: LEFT HEART CATH AND CORONARY ANGIOGRAPHY;  Surgeon: Belva Crome, MD;  Location: [facility];  Service: Cardiovascular;  Laterality: N/A;  . LEFT HEART CATH AND CORONARY ANGIOGRAPHY N/A 12/19/2019   Procedure: LEFT HEART CATH AND CORONARY ANGIOGRAPHY;  Surgeon: Lorretta Harp, MD;  Location: [facility];  Service: Cardiovascular;  Laterality: N/A;  . LITHOTRIPSY    . SKIN SURGERY Right    "birthmark removed under my arm when I was little"     OB History   No obstetric history on file.     Family History  Problem Relation Age of Onset  . CAD Father   . Heart disease Father   . Breast cancer Mother   . Hyperlipidemia Sister   . Hyperlipidemia Brother   . Colon cancer Neg Hx   . Esophageal cancer Neg Hx   . Rectal cancer Neg Hx   . Stomach cancer Neg Hx     Social History   Tobacco Use  . Smoking status: Never Smoker  . Smokeless tobacco: Never Used  Vaping Use  . Vaping Use: Never used  Substance Use Topics  . Alcohol use:  Yes    Comment: 12/15/2017 "couple mixed drinks/month"  . Drug use: Never    Home Medications Prior to Admission medications   Medication Sig Start Date End Date Taking? Authorizing Provider  amLODipine (NORVASC) 10 MG tablet Take 1 tablet (10 mg total) by mouth daily. 07/10/20   Ladell Pier, MD  aspirin 81 MG chewable tablet Chew 1 tablet (81 mg total) by mouth daily. 12/21/17   Lyda Jester M, PA-C  dicyclomine (BENTYL) 10 MG capsule TAKE 2 CAPSULES (20 MG TOTAL) BY MOUTH EVERY 8 (EIGHT) HOURS AS NEEDED FOR SPASMS. FOR ABDOMINAL CRAMPS AND DIARRHEA 05/13/20   Doran Stabler, MD   esomeprazole (NEXIUM) 20 MG capsule Take 20 mg by mouth daily at 12 noon.    [provider]  Evolocumab (REPATHA SURECLICK) 259 MG/ML SOAJ Inject 1 pen into the skin every 14 (fourteen) days.    [provider]  ezetimibe (ZETIA) 10 MG tablet Take 1 tablet (10 mg total) by mouth daily. 11/01/19   Ladell Pier, MD  isosorbide dinitrate (ISORDIL) 30 MG tablet Take 30 mg by mouth daily.    [provider]  losartan (COZAAR) 100 MG tablet TAKE 1 TABLET BY MOUTH DAILY. 05/14/20   Fay Records, MD  metoprolol succinate (TOPROL-XL) 50 MG 24 hr tablet Take 1 tablet (50 mg total) by mouth daily. 06/02/20   Richardson Dopp T, PA-C  nitroGLYCERIN (NITROSTAT) 0.4 MG SL tablet Place 1 tablet (0.4 mg total) under the tongue every 5 (five) minutes x 3 doses as needed for chest pain. 12/18/19   Richardson Dopp T, PA-C  rosuvastatin (CRESTOR) 40 MG tablet Take 1 tablet (40 mg total) by mouth daily. 01/16/20   Richardson Dopp T, PA-C  venlafaxine XR (EFFEXOR-XR) 75 MG 24 hr capsule Take 1 capsule (75 mg total) by mouth daily with breakfast. 07/10/20   Ladell Pier, MD    Allergies    Patient has no known allergies.  Review of Systems   Review of Systems  Constitutional: Negative for chills and fever.  HENT: Positive for sore throat. Negative for ear pain.   Eyes: Negative for pain and visual disturbance.  Respiratory: Positive for shortness of breath. Negative for cough.   Cardiovascular: Positive for chest pain. Negative for palpitations.  Gastrointestinal: Positive for nausea and vomiting. Negative for abdominal pain.  Genitourinary: Negative for dysuria and hematuria.  Musculoskeletal: Positive for back pain. Negative for arthralgias.  Skin: Negative for color change and rash.  Neurological: Negative for seizures and syncope.  All other systems reviewed and are negative.   Physical Exam Updated Vital Signs BP 125/68 (BP Location: Left Arm)   Pulse (!) 102   Resp 20    LMP 02/28/2011   SpO2 (!) 89%   Physical Exam Vitals and nursing note reviewed.  Constitutional:      General: She is not in acute distress.    Appearance: She is well-developed and well-nourished.  HENT:     Head: Normocephalic and atraumatic.     Mouth/Throat:     Mouth: Mucous membranes are moist.     Pharynx: Posterior oropharyngeal erythema present. No uvula swelling.     Comments: Intense pharyngeal erythema with a small ulcer in the left pharynx. Eyes:     Conjunctiva/sclera: Conjunctivae normal.  Cardiovascular:     Rate and Rhythm: Regular rhythm. Tachycardia present.     Heart sounds: No murmur heard.   Pulmonary:     Effort: Pulmonary effort  is normal. No respiratory distress.     Breath sounds: Normal breath sounds.  Abdominal:     Palpations: Abdomen is soft.     Tenderness: There is no abdominal tenderness.  Musculoskeletal:        General: No edema.     Cervical back: Normal range of motion and neck supple.  Lymphadenopathy:     Cervical: No cervical adenopathy.  Skin:    General: Skin is warm and dry.  Neurological:     Mental Status: She is alert.  Psychiatric:        Mood and Affect: Mood and affect normal.     ED Results / Procedures / Treatments   Labs (all labs ordered are listed, but only abnormal results are displayed) Labs Reviewed  BASIC METABOLIC PANEL - Abnormal; Notable for the following components:      Result Value   Glucose, Bld 108 (*)    BUN 22 (*)    All other components within normal limits  SARS CORONAVIRUS 2 (TAT 6-24 HRS)  CBC  D-DIMER, QUANTITATIVE  BRAIN NATRIURETIC PEPTIDE  POC SARS CORONAVIRUS 2 AG -  ED  TROPONIN I (HIGH SENSITIVITY)  TROPONIN I (HIGH SENSITIVITY)    EKG None  Tachycardia, no acute ischemia, baseline artifact Radiology DG Chest 2 View  Result Date: 08/06/2020 CLINICAL DATA:  Chest pain.  Shortness of breath. EXAM: CHEST - 2 VIEW COMPARISON:  Two-view chest x-ray 09/07/2019 FINDINGS: Heart size  exaggerated by low lung volumes. No edema or effusion is present. No focal airspace disease is evident. Stable nodularity noted in the right lung. No follow-up necessary. Rightward curvature of the thoracic spine is stable. IMPRESSION: 1. Low lung volumes. 2. No acute cardiopulmonary disease. Electronically Signed   By: San Morelle M.D.   On: 08/06/2020 10:14   CT Chest W Contrast  Result Date: 08/06/2020 CLINICAL DATA:  Chest pain.  Pleural effusion suspected. EXAM: CT CHEST WITH CONTRAST TECHNIQUE: Multidetector CT imaging of the chest was performed during intravenous contrast administration. CONTRAST:  36mL OMNIPAQUE IOHEXOL 300 MG/ML  SOLN COMPARISON:  07/07/2018 FINDINGS: Cardiovascular: There is no evidence for thoracic aortic dissection or aneurysm. There are coronary artery calcifications. There is no large centrally located pulmonary embolism. There is no significant pericardial effusion. Mediastinum/Nodes: -- No mediastinal lymphadenopathy. -- No hilar lymphadenopathy. -- No axillary lymphadenopathy. -- No supraclavicular lymphadenopathy. --there is a heterogeneous left-sided thyroid nodule measuring approximately 2.9 cm. This was previously evaluated by ultrasound. No further follow-up is required unless indicated by the prior imaging. -  Unremarkable esophagus. Lungs/Pleura: There is a grossly stable ir nodular density within the right middle lobe. There is no pneumothorax or large pleural effusion. There is no new concerning pulmonary nodule. The trachea is unremarkable. Upper Abdomen: There is likely underlying hepatic steatosis. There are nonobstructing stones in the upper pole of both kidneys. Musculoskeletal: No chest wall abnormality. No bony spinal canal stenosis. IMPRESSION: 1. No acute abnormality. 2. Coronary artery disease. 3. Bilateral nephrolithiasis. 4. Probable hepatic steatosis. 5. Stable nodular density in the right middle lobe. Given its stability, this is almost certainly a  benign process requiring no further follow-up. Aortic Atherosclerosis (ICD10-I70.0). Electronically Signed   By: Constance Holster M.D.   On: 08/06/2020 15:38    Procedures Procedures   Medications Ordered in ED Medications - No data to display  ED Course  I have reviewed the triage vital signs and the nursing notes.  Pertinent labs & imaging results that were available  during my care of the patient were reviewed by me and considered in my medical decision making (see chart for details).  Clinical Course as of 08/06/20 1559  Wed Aug 06, 2020  1137 SpO2: 100 % [AW]    Clinical Course User Index [AW] Arnaldo Natal, MD   MDM Rules/Calculators/A&P                          Patient is 60 years old with history of CAD.  She presents with a sore throat and some back pain.  The back pain is typical of her prior anginal symptoms.  She has had a sore throat for about a day.  During her ED course she was noted to be intermittently hypoxic.  She denied any symptoms, and her ambulation O2 sat was within normal limits.  ED work-up focused on the etiology of both her back pain and her mild hypoxia.  She was evaluated for possible ACS, CHF, pneumonia, PE.  No obvious cause was found.  I think her symptoms are most likely viral in etiology, and I am still entertaining the diagnosis of COVID-19.  PCR test is pending at discharge, and she was given very careful return precautions.  I told the patient that she must have her oxygen saturation checked tomorrow, and she is in agreement with this plan. Final Clinical Impression(s) / ED Diagnoses Final diagnoses:  Sore throat  Chest pain, unspecified type  Hypoxia  Coronary artery disease involving native coronary artery of native heart with angina pectoris [facility])    Rx / DC Orders ED Discharge Orders    None       Arnaldo Natal, MD 08/06/20 [phone]

## 2020-08-07 ENCOUNTER — Other Ambulatory Visit: Payer: Self-pay | Admitting: Internal Medicine

## 2020-08-07 DIAGNOSIS — F3342 Major depressive disorder, recurrent, in full remission: Secondary | ICD-10-CM

## 2020-08-07 MED FILL — VENLAFAXINE HCL ER 75 MG CA: 75 | 30 days supply | Qty: 30 | Fill #0

## 2020-08-07 MED FILL — AMLODIPINE BESYLATE 10 MG T: 10 | 30 days supply | Qty: 30 | Fill #1

## 2020-08-07 NOTE — Telephone Encounter (Signed)
Enough given until appt on 09/05/2020

## 2020-08-11 ENCOUNTER — Telehealth: Payer: Self-pay | Admitting: Internal Medicine

## 2020-08-11 NOTE — Telephone Encounter (Signed)
Pt is schedule a virtual app on 3/15

## 2020-08-11 NOTE — Telephone Encounter (Signed)
Copied from [place] [id number]. Topic: General - Other >> Aug 11, 2020  8:59 AM Tessa Lerner A wrote: Reason for CRM: Patient has made contact to notify PCP that on 08/06/20 patient went to the [facility] ED and continues to experience difficulty breathing Patient has been using an Oxygen meter and would like to discuss the issue further Patient has an upcoming visit with PCP on 09/05/20 Please contact to further advise

## 2020-08-12 ENCOUNTER — Encounter: Payer: Self-pay | Admitting: Nurse Practitioner

## 2020-08-12 ENCOUNTER — Other Ambulatory Visit: Payer: Self-pay | Admitting: Nurse Practitioner

## 2020-08-12 ENCOUNTER — Other Ambulatory Visit: Payer: Self-pay

## 2020-08-12 ENCOUNTER — Telehealth: Payer: Self-pay | Admitting: Physician Assistant

## 2020-08-12 ENCOUNTER — Ambulatory Visit: Payer: Self-pay | Attending: Nurse Practitioner | Admitting: Nurse Practitioner

## 2020-08-12 DIAGNOSIS — R059 Cough, unspecified: Secondary | ICD-10-CM

## 2020-08-12 DIAGNOSIS — Z09 Encounter for follow-up examination after completed treatment for conditions other than malignant neoplasm: Secondary | ICD-10-CM

## 2020-08-12 MED ORDER — PREDNISONE 20 MG PO TABS: 20.0000 mg | ORAL_TABLET | Freq: Every day | ORAL | 0 refills | Status: DC

## 2020-08-12 MED ORDER — ALBUTEROL SULFATE HFA 108 (90 BASE) MCG/ACT IN AERS: 2.0000 | INHALATION_SPRAY | RESPIRATORY_TRACT | 0 refills | Status: DC | PRN

## 2020-08-12 NOTE — Progress Notes (Signed)
Virtual Visit via VIDEO Due to national recommendations of social distancing due to COVID 19, video  visit is felt to be most appropriate for this patient at this time.  I discussed the limitations, risks, security and privacy concerns of performing an evaluation and management service by video and the availability of in person appointments. I also discussed with the patient that there may be a patient responsible charge related to this service. The patient expressed understanding and agreed to proceed.    I connected with [name] on 08/12/20  at  10:50 AM EDT  EDT by VIDEO and verified that I am speaking with the correct person using two identifiers.   Consent I discussed the limitations, risks, security and privacy concerns of performing an evaluation and management service by video and the availability of in person appointments. I also discussed with the patient that there may be a patient responsible charge related to this service. The patient expressed understanding and agreed to proceed.   Location of Patient: Private Residence    Location of Provider: [place] and [place] participating in Telemedicine visit: Geryl Rankins FNP-BC [place]    History of Present Illness: Telemedicine visit for: HFU, cough, Shortness of breath She was evaluated in the emergency room last week on 3 9 with complaints of sore throat, shortness of breath, back pain, chest pain, nausea and vomiting.  She was also noted for hypoxia and tachycardia with oxygen saturation in the 80s.  Work-up was negative for ACS, CHF, pneumonia or PE.  She was discharged home with wife bring viral etiology.  Covid PCR negative.  She was instructed to have her oxygen saturations checked the following day however she did not return to the emergency room.  Today she has complaints of persistent shortness of breath with cough and hypoxia.  Monitoring home O2  readings and heart rate with pulse oximeter.  Notes oxygen saturations range from 70s to 90s with minimal activity and heart rate as high as 110 to 120s. I will send prednisone and albuterol inhaler for her to try however she was instructed if ineffective and her current condition continues to worsen she should be evaluated in the emergency room as soon as possible. She denies hemoptysis, N/V or chest pain at this time. She does not smoke or have a history of asthma or COPD.   Past Medical History:  Diagnosis Date  . Coronary artery disease   . Depression   . History of kidney stones   . Hyperlipidemia   . Hypertension   . Migraine    "stopped ~ 2010" (12/15/2017)  . NSTEMI (non-ST elevated myocardial infarction) ([place]) 12/15/2017    Past Surgical History:  Procedure Laterality Date  . CORONARY STENT INTERVENTION N/A 12/19/2017   Procedure: CORONARY STENT INTERVENTION;  Surgeon: Belva Crome, MD;  Location: [facility];  Service: Cardiovascular;  Laterality: N/A;  . CYSTOSCOPY W/ STONE MANIPULATION    . FACIAL FRACTURE SURGERY  1979  . LEFT HEART CATH AND CORONARY ANGIOGRAPHY N/A 12/16/2017   Procedure: LEFT HEART CATH AND CORONARY ANGIOGRAPHY;  Surgeon: Belva Crome, MD;  Location: [facility];  Service: Cardiovascular;  Laterality: N/A;  . LEFT HEART CATH AND CORONARY ANGIOGRAPHY N/A 12/19/2019   Procedure: LEFT HEART CATH AND CORONARY ANGIOGRAPHY;  Surgeon: Lorretta Harp, MD;  Location: [facility];  Service: Cardiovascular;  Laterality: N/A;  . LITHOTRIPSY    .  SKIN SURGERY Right    "birthmark removed under my arm when I was little"    Family History  Problem Relation Age of Onset  . CAD Father   . Heart disease Father   . Breast cancer Mother   . Hyperlipidemia Sister   . Hyperlipidemia Brother   . Colon cancer Neg Hx   . Esophageal cancer Neg Hx   . Rectal cancer Neg Hx   . Stomach cancer Neg Hx     Social History   Socioeconomic History  .  Marital status: Divorced    Spouse name: Not on file  . Number of children: 2  . Years of education: Not on file  . Highest education level: Not on file  Occupational History  . Not on file  Tobacco Use  . Smoking status: Never Smoker  . Smokeless tobacco: Never Used  Vaping Use  . Vaping Use: Never used  Substance and Sexual Activity  . Alcohol use: Yes    Comment: 12/15/2017 "couple mixed drinks/month"  . Drug use: Never  . Sexual activity: Yes    Comment: menopausal  Other Topics Concern  . Not on file  Social History Narrative  . Not on file   Social Determinants of Health   Financial Resource Strain: Not on file  Food Insecurity: Not on file  Transportation Needs: Not on file  Physical Activity: Not on file  Stress: Not on file  Social Connections: Not on file     Observations/Objective: Awake, alert and oriented x 3   Review of Systems  Constitutional: Negative for fever, malaise/fatigue and weight loss.  HENT: Negative.  Negative for nosebleeds.   Eyes: Negative.  Negative for blurred vision, double vision and photophobia.  Respiratory: Positive for cough and shortness of breath.   Cardiovascular: Negative.  Negative for chest pain, palpitations and leg swelling.  Gastrointestinal: Negative.  Negative for heartburn, nausea and vomiting.  Musculoskeletal: Negative.  Negative for myalgias.  Neurological: Negative.  Negative for dizziness, focal weakness, seizures and headaches.  Psychiatric/Behavioral: Negative.  Negative for suicidal ideas.    Physical Exam Constitutional:      General: She is not in acute distress.    Appearance: She is ill-appearing.  HENT:     Head: Normocephalic.  Pulmonary:     Effort: Pulmonary effort is normal.  Neurological:     Mental Status: She is alert and oriented to person, place, and time.  Psychiatric:        Mood and Affect: Mood normal.    Assessment and Plan: Diagnoses and all orders for this visit:  Hospital  discharge follow-up -     albuterol (VENTOLIN HFA) 108 (90 Base) MCG/ACT inhaler; Inhale 2 puffs into the lungs every 4 (four) hours as needed for wheezing or shortness of breath. -     predniSONE (DELTASONE) 20 MG tablet; Take 1 tablet (20 mg total) by mouth daily with breakfast for 5 days.  Cough in adult patient -     albuterol (VENTOLIN HFA) 108 (90 Base) MCG/ACT inhaler; Inhale 2 puffs into the lungs every 4 (four) hours as needed for wheezing or shortness of breath. -     predniSONE (DELTASONE) 20 MG tablet; Take 1 tablet (20 mg total) by mouth daily with breakfast for 5 days.     Follow Up Instructions Return if symptoms worsen or fail to improve.     I discussed the assessment and treatment plan with the patient. The patient was provided an opportunity  to ask questions and all were answered. The patient agreed with the plan and demonstrated an understanding of the instructions.   The patient was advised to call back or seek an in-person evaluation if the symptoms worsen or if the condition fails to improve as anticipated.  I provided 15 minutes of non-face-to-face time during this encounter including median intraservice time, reviewing previous notes, labs, imaging, medications and explaining diagnosis and management.  Gildardo Pounds, FNP-BC

## 2020-08-12 NOTE — Telephone Encounter (Signed)
Left message for patient to call back  

## 2020-08-12 NOTE — Telephone Encounter (Signed)
New Message:     Pt says she needs to be seen. She says her oxygen level is running low. Its been as low as 66 and heart rate have been high,  STAT if HR is under 50 or over 120 (normal HR is 60-100 beats per minute)  1) What is your heart rate? Running as high as 135, it is 101 now  2) Do you have a log of your heart rate readings (document readings)? In the last few days  3) Do you have any other symptoms?  Short of breath, when oxygen is low, she gets really confused-

## 2020-08-13 NOTE — Telephone Encounter (Signed)
Agree with recomm   It sounds more infectious, not cardiac.

## 2020-08-13 NOTE — Telephone Encounter (Signed)
Reached patient by phone.   She is congested in sinuses and chest.  Aches and pains in upper body.  ocass cough, productive for light green thick mucous.  No CP. Doing the easiest activity, taking a shower, her HR goes up 130s and O2 sats go down low she states 49%,  And takes about 15-20 min to get into 90s.  She gets SOB at rest, currently O2 89%, HR 97.   Sometimes it gets to mid 90s with no exertion at all.   2 negative covid tests at the hosptial.  Not received any antibiotics.   Video visit with PCP yesterday.  Got prescription for inhaler and prednisone.   She said she feels worse today than yesterday but no change in O2s, HR.  Does not see pulmonary.  Next follow up in primary care is end of April.     Symptoms started a week ago.  Does not wear supplemental O2 normally.  But before these symptoms started, she was having SOB and fast heart rates w ambulating at work for about 3-4 weeks.     I adv her to send a message to her PCP and let them know she feels worse today.  Adv if sats drop into 80s and stay there she should return to ED.  Also adv that if her fingers are cold she is not getting accurate readings of pulse oximeter.

## 2020-08-14 NOTE — Telephone Encounter (Signed)
Error. Disregard

## 2020-08-15 ENCOUNTER — Encounter: Payer: Self-pay | Admitting: Nurse Practitioner

## 2020-08-15 ENCOUNTER — Ambulatory Visit: Payer: Self-pay | Admitting: Physician Assistant

## 2020-08-18 ENCOUNTER — Emergency Department (HOSPITAL_COMMUNITY): Payer: Self-pay

## 2020-08-18 ENCOUNTER — Other Ambulatory Visit: Payer: Self-pay

## 2020-08-18 ENCOUNTER — Ambulatory Visit (HOSPITAL_COMMUNITY)
Admission: EM | Admit: 2020-08-18 | Discharge: 2020-08-18 | Disposition: A | Discharge status: Home / Self Care | Payer: Self-pay

## 2020-08-18 ENCOUNTER — Encounter (HOSPITAL_COMMUNITY): Payer: Self-pay | Admitting: Emergency Medicine

## 2020-08-18 ENCOUNTER — Other Ambulatory Visit: Payer: Self-pay | Admitting: Nurse Practitioner

## 2020-08-18 ENCOUNTER — Emergency Department (HOSPITAL_COMMUNITY)
Admission: EM | Admit: 2020-08-18 | Discharge: 2020-08-18 | Discharge status: Against Medical Advice | Payer: Self-pay | Attending: Emergency Medicine | Admitting: Emergency Medicine

## 2020-08-18 DIAGNOSIS — R0602 Shortness of breath: Secondary | ICD-10-CM

## 2020-08-18 DIAGNOSIS — R000 Tachycardia, unspecified: Secondary | ICD-10-CM | POA: Insufficient documentation

## 2020-08-18 DIAGNOSIS — R9431 Abnormal electrocardiogram [ECG] [EKG]: Secondary | ICD-10-CM

## 2020-08-18 DIAGNOSIS — R0682 Tachypnea, not elsewhere classified: Secondary | ICD-10-CM

## 2020-08-18 DIAGNOSIS — R5383 Other fatigue: Secondary | ICD-10-CM

## 2020-08-18 DIAGNOSIS — R42 Dizziness and giddiness: Secondary | ICD-10-CM | POA: Insufficient documentation

## 2020-08-18 DIAGNOSIS — I25119 Atherosclerotic heart disease of native coronary artery with unspecified angina pectoris: Secondary | ICD-10-CM | POA: Insufficient documentation

## 2020-08-18 DIAGNOSIS — R0989 Other specified symptoms and signs involving the circulatory and respiratory systems: Secondary | ICD-10-CM | POA: Insufficient documentation

## 2020-08-18 DIAGNOSIS — I10 Essential (primary) hypertension: Secondary | ICD-10-CM | POA: Insufficient documentation

## 2020-08-18 DIAGNOSIS — R059 Cough, unspecified: Secondary | ICD-10-CM | POA: Insufficient documentation

## 2020-08-18 DIAGNOSIS — Z7982 Long term (current) use of aspirin: Secondary | ICD-10-CM | POA: Insufficient documentation

## 2020-08-18 DIAGNOSIS — Z79899 Other long term (current) drug therapy: Secondary | ICD-10-CM | POA: Insufficient documentation

## 2020-08-18 LAB — BASIC METABOLIC PANEL
Anion gap: 13 (ref 5–15)
BUN: 29 mg/dL — ABNORMAL HIGH (ref 6–20)
CO2: 22 mmol/L (ref 22–32)
Calcium: 10 mg/dL (ref 8.9–10.3)
Chloride: 99 mmol/L (ref 98–111)
Creatinine, Ser: 1.42 mg/dL — ABNORMAL HIGH (ref 0.44–1.00)
GFR, Estimated: 42 mL/min — ABNORMAL LOW (ref >60)
Glucose, Bld: 130 mg/dL — ABNORMAL HIGH (ref 70–99)
Potassium: 4.1 mmol/L (ref 3.5–5.1)
Sodium: 134 mmol/L — ABNORMAL LOW (ref 135–145)

## 2020-08-18 LAB — TSH: TSH: 3.296 u[IU]/mL (ref 0.350–4.500)

## 2020-08-18 LAB — CBC
HCT: 44.5 % (ref 36.0–46.0)
Hemoglobin: 14.7 g/dL (ref 12.0–15.0)
MCH: 30.3 pg (ref 26.0–34.0)
MCHC: 33 g/dL (ref 30.0–36.0)
MCV: 91.8 fL (ref 80.0–100.0)
Platelets: 470 10*3/uL — ABNORMAL HIGH (ref 150–400)
RBC: 4.85 MIL/uL (ref 3.87–5.11)
RDW: 13.7 % (ref 11.5–15.5)
WBC: 7.7 10*3/uL (ref 4.0–10.5)
nRBC: 0 % (ref 0.0–0.2)

## 2020-08-18 LAB — TROPONIN I (HIGH SENSITIVITY): Troponin I (High Sensitivity): 8 ng/L (ref <18)

## 2020-08-18 LAB — I-STAT BETA HCG BLOOD, ED (MC, WL, AP ONLY): I-stat hCG, quantitative: <5 m[IU]/mL (ref <5)

## 2020-08-18 LAB — CBG MONITORING, ED: Glucose-Capillary: 113 mg/dL — ABNORMAL HIGH (ref 70–99)

## 2020-08-18 MED ORDER — GUAIFENESIN 100 MG/5ML PO LIQD: 200.0000 mg | Freq: Three times a day (TID) | ORAL | 0 refills | Status: DC | PRN

## 2020-08-18 MED ORDER — SODIUM CHLORIDE 0.9 % IV BOLUS
1000.0000 mL | Freq: Once | INTRAVENOUS | Status: AC
  Administered 2020-08-18: 1000 mL via INTRAVENOUS

## 2020-08-18 NOTE — ED Provider Notes (Signed)
[facility] EMERGENCY DEPARTMENT Provider Note   CSN: [id number] Arrival date & time: 08/18/20  1630     History Chief Complaint  Patient presents with  . Shortness of Breath  . Dizziness    Monique Issabela Hunt is a 60 y.o. female.  HPI   60 year old female with a history of CAD, depression, kidney stones, hyperlipidemia, hypertension, migraines, NSTEMI, who presents to the emergency department today for evaluation of shortness of breath and dizziness.  Patient was seen at urgent care prior to arrival for evaluation of her symptoms and was sent to the ED for further evaluation.  She states that for the last 2 weeks she has felt very fatigued.  She states she is short of breath.  Her symptoms initially started with some nasal congestion and then moved into her chest with some chest congestion and cough.  She denies any chest pain or pleuritic pain.  She has not had any hemoptysis or fevers.  She has had no vomiting or diarrhea.  She reports alcohol use about 2-3 times per week but has not drinking about 2 to 3 weeks.  She denies tobacco use or any drug use.  Denies any bilateral lower extremity swelling.  Reports some lightheadedness but denies vertiginous symptoms.  She was seen in the ED for similar symptoms on 08/06/2020 and had a reassuring work-up at that time.  Past Medical History:  Diagnosis Date  . Coronary artery disease   . Depression   . History of kidney stones   . Hyperlipidemia   . Hypertension   . Migraine    "stopped ~ 2010" (12/15/2017)  . NSTEMI (non-ST elevated myocardial infarction) ([place]) 12/15/2017    Patient Active Problem List   Diagnosis Date Noted  . Coronary artery disease involving native coronary artery of native heart with angina pectoris ([place]) 08/06/2020  . Low TSH level 08/04/2018  . Chronic diarrhea 06/05/2018  . Left thyroid nodule 04/07/2018  . Lung nodule, solitary 04/07/2018  . CAD (coronary artery disease), native  coronary artery 12/19/2017  . Hyperlipidemia LDL goal <70   . NSTEMI (non-ST elevated myocardial infarction) ([place]) 12/15/2017  . Hypertension 12/01/2017  . Depression 06/04/2012  . Nephrolithiasis 09/27/2011    Past Surgical History:  Procedure Laterality Date  . CORONARY STENT INTERVENTION N/A 12/19/2017   Procedure: CORONARY STENT INTERVENTION;  Surgeon: Belva Crome, MD;  Location: [facility];  Service: Cardiovascular;  Laterality: N/A;  . CYSTOSCOPY W/ STONE MANIPULATION    . FACIAL FRACTURE SURGERY  1979  . LEFT HEART CATH AND CORONARY ANGIOGRAPHY N/A 12/16/2017   Procedure: LEFT HEART CATH AND CORONARY ANGIOGRAPHY;  Surgeon: Belva Crome, MD;  Location: [facility];  Service: Cardiovascular;  Laterality: N/A;  . LEFT HEART CATH AND CORONARY ANGIOGRAPHY N/A 12/19/2019   Procedure: LEFT HEART CATH AND CORONARY ANGIOGRAPHY;  Surgeon: Lorretta Harp, MD;  Location: [facility];  Service: Cardiovascular;  Laterality: N/A;  . LITHOTRIPSY    . SKIN SURGERY Right    "birthmark removed under my arm when I was little"     OB History   No obstetric history on file.     Family History  Problem Relation Age of Onset  . CAD Father   . Heart disease Father   . Breast cancer Mother   . Hyperlipidemia Sister   . Hyperlipidemia Brother   . Colon cancer Neg Hx   . Esophageal cancer Neg Hx   .  Rectal cancer Neg Hx   . Stomach cancer Neg Hx     Social History   Tobacco Use  . Smoking status: Never Smoker  . Smokeless tobacco: Never Used  Vaping Use  . Vaping Use: Never used  Substance Use Topics  . Alcohol use: Yes    Comment: 3 TIMES A WEEK  . Drug use: Never    Home Medications Prior to Admission medications   Medication Sig Start Date End Date Taking? Authorizing Provider  albuterol (VENTOLIN HFA) 108 (90 Base) MCG/ACT inhaler Inhale 2 puffs into the lungs every 4 (four) hours as needed for wheezing or shortness of breath. 08/12/20   Gildardo Pounds, NP  amLODipine (NORVASC) 10 MG tablet Take 1 tablet (10 mg total) by mouth daily. 07/10/20   Ladell Pier, MD  aspirin 81 MG chewable tablet Chew 1 tablet (81 mg total) by mouth daily. 12/21/17   Lyda Jester M, PA-C  dicyclomine (BENTYL) 10 MG capsule TAKE 2 CAPSULES (20 MG TOTAL) BY MOUTH EVERY 8 (EIGHT) HOURS AS NEEDED FOR SPASMS. FOR ABDOMINAL CRAMPS AND DIARRHEA 05/13/20   Doran Stabler, MD  esomeprazole (NEXIUM) 20 MG capsule Take 20 mg by mouth daily at 12 noon.    [provider]  Evolocumab (REPATHA SURECLICK) 831 MG/ML SOAJ Inject 1 pen into the skin every 14 (fourteen) days.    [provider]  ezetimibe (ZETIA) 10 MG tablet Take 1 tablet (10 mg total) by mouth daily. 11/01/19   Ladell Pier, MD  guaiFENesin (MUCINEX FAST-MAX CHEST CONG MS) 100 MG/5ML liquid Take 10 mLs (200 mg total) by mouth 3 (three) times daily as needed for cough. 08/18/20   Gildardo Pounds, NP  isosorbide dinitrate (ISORDIL) 30 MG tablet Take 30 mg by mouth daily.    [provider]  losartan (COZAAR) 100 MG tablet TAKE 1 TABLET BY MOUTH DAILY. 05/14/20   Fay Records, MD  metoprolol succinate (TOPROL-XL) 50 MG 24 hr tablet Take 1 tablet (50 mg total) by mouth daily. 06/02/20   Richardson Dopp T, PA-C  nitroGLYCERIN (NITROSTAT) 0.4 MG SL tablet Place 1 tablet (0.4 mg total) under the tongue every 5 (five) minutes x 3 doses as needed for chest pain. 12/18/19   Richardson Dopp T, PA-C  rosuvastatin (CRESTOR) 40 MG tablet Take 1 tablet (40 mg total) by mouth daily. 01/16/20   Richardson Dopp T, PA-C  venlafaxine XR (EFFEXOR-XR) 75 MG 24 hr capsule TAKE 1 CAPSULE (75 MG TOTAL) BY MOUTH DAILY WITH BREAKFAST. 08/07/20   Ladell Pier, MD    Allergies    Patient has no known allergies.  Review of Systems   Review of Systems  Constitutional: Negative for chills and fever.  HENT: Positive for congestion and rhinorrhea. Negative for ear pain.   Eyes: Negative for visual  disturbance.  Respiratory: Positive for cough and shortness of breath.   Cardiovascular: Negative for chest pain and leg swelling.  Gastrointestinal: Negative for abdominal pain, constipation, diarrhea, nausea and vomiting.  Genitourinary: Negative for dysuria and hematuria.  Musculoskeletal: Negative for back pain.  Skin: Negative for color change and rash.  Neurological: Positive for light-headedness. Negative for dizziness, seizures and syncope.  All other systems reviewed and are negative.   Physical Exam Updated Vital Signs BP 119/78 (BP Location: Right Arm)   Pulse (!) 115   Temp 98.1 F (36.7 C) (Oral)   Resp (!) 28   Ht 5\' 5"  (1.651 m)  Wt 68 kg   LMP 02/28/2011   SpO2 98%   BMI 24.96 kg/m   Physical Exam Vitals and nursing note reviewed.  Constitutional:      General: She is not in acute distress.    Appearance: She is well-developed.  HENT:     Head: Normocephalic and atraumatic.  Eyes:     Conjunctiva/sclera: Conjunctivae normal.  Cardiovascular:     Rate and Rhythm: Regular rhythm. Tachycardia present.     Heart sounds: Normal heart sounds. No murmur heard.   Pulmonary:     Effort: Pulmonary effort is normal. Tachypnea present.     Breath sounds: Normal breath sounds.  Abdominal:     General: Bowel sounds are normal.     Palpations: Abdomen is soft.     Tenderness: There is no abdominal tenderness. There is no guarding or rebound.  Musculoskeletal:     Cervical back: Neck supple.     Right lower leg: No tenderness. No edema.     Left lower leg: No tenderness. No edema.  Skin:    General: Skin is warm and dry.  Neurological:     Mental Status: She is alert.     ED Results / Procedures / Treatments   Labs (all labs ordered are listed, but only abnormal results are displayed) Labs Reviewed  BASIC METABOLIC PANEL - Abnormal; Notable for the following components:      Result Value   Sodium 134 (*)    Glucose, Bld 130 (*)    BUN 29 (*)     Creatinine, Ser 1.42 (*)    GFR, Estimated 42 (*)    All other components within normal limits  CBC - Abnormal; Notable for the following components:   Platelets 470 (*)    All other components within normal limits  CBG MONITORING, ED - Abnormal; Notable for the following components:   Glucose-Capillary 113 (*)    All other components within normal limits  TSH  URINALYSIS, ROUTINE W REFLEX MICROSCOPIC  RAPID URINE DRUG SCREEN, HOSP PERFORMED  LACTIC ACID, PLASMA  LACTIC ACID, PLASMA  BRAIN NATRIURETIC PEPTIDE  I-STAT BETA HCG BLOOD, ED ([facility], [facility], [facility] ONLY)  TROPONIN I (HIGH SENSITIVITY)  TROPONIN I (HIGH SENSITIVITY)    EKG None  Radiology DG Chest 2 View  Result Date: 08/18/2020 CLINICAL DATA:  Shortness of breath EXAM: CHEST - 2 VIEW COMPARISON:  08/06/2020 FINDINGS: The heart size and mediastinal contours are within normal limits. Both lungs are clear. The visualized skeletal structures are unremarkable. IMPRESSION: No active cardiopulmonary disease. Electronically Signed   By: Rolm Baptise M.D.   On: 08/18/2020 19:18    Procedures Procedures   Medications Ordered in ED Medications  sodium chloride 0.9 % bolus 1,000 mL (0 mLs Intravenous Stopped 08/18/20 1928)    ED Course  I have reviewed the triage vital signs and the nursing notes.  Pertinent labs & imaging results that were available during my care of the patient were reviewed by me and considered in my medical decision making (see chart for details).    MDM Rules/Calculators/A&P                         60 year old female presents emergency department today for evaluation of fatigue and shortness of breath ongoing for the last 2 weeks.  Has been seen multiple times with reassuring work-ups.  On evaluation today she is tachycardic and tachypneic but lungs are clear to auscultation bilaterally.  Initial work-up revealed neg  CXR. EKG with sinus tachycardia, otherwise reassuring.  CBC shows thrombocytopenia which is  chronic but otherwise is reassuring.  Her BMP shows a mild AKI with an elevated BUN/creatinine.  Electrolytes are otherwise reassuring.  Her TSH, troponin and CBG are reassuring.  Beta hCG is negative.  I was informed by nursing staff that the patient was requesting to leave.  I had a long discussion with her regarding pending CTA of the chest and pending laboratory work.  Discussed the dangers of leaving [facility] including permanent disability or death.  Patient appears to have decision-making capability and reports understanding of the risk associated with this however continues to insist she wants to go home.  She states that if she continues to feel bad she will return to the ED for reassessment and I encouraged her to do so.  Final Clinical Impression(s) / ED Diagnoses Final diagnoses:  Fatigue, unspecified type  Shortness of breath    Rx / DC Orders ED Discharge Orders    None       Bishop Dublin 08/18/20 2231    Pattricia Boss, MD 08/19/20 [phone]

## 2020-08-18 NOTE — ED Triage Notes (Signed)
PT. STATED, IVE HAD SOB FOR 2 WEEKS AND WAS GIVEN PREDNISONE IT HELP BUT ONLY LASTED 5 DAYS.. I WALKED DOWN HERE FROM UC AND MAYBE THAT'S WHY IM DIZZY. I WAS D/C FROM ER 2 WEEKS AGO AND THEY COULDN'T FIND ANYTHING.

## 2020-08-18 NOTE — ED Notes (Addendum)
Pt noted to be walking down hallway to leave. Pt reports she wants to leave.  Convinced pt to let the provider talk to her. Pt adamant about leaving. Pt agreed to walk back to room to talk to provider. Risks of leaving explained to pt by PA. Pt still adamant about leaving. IV removed. VS obtained. Pt is still tachypneic and tachycardic. Pt is A&Ox4 and ambulatory w/ steady gait.

## 2020-08-18 NOTE — ED Triage Notes (Signed)
Pt presents today with c/o of SOB, chest congestion and cough x 2 weeks.

## 2020-08-18 NOTE — ED Triage Notes (Signed)
Note: stopped taking blood thinner one month ago, per Dr. Kathlen Mody

## 2020-08-18 NOTE — ED Notes (Signed)
Patient is being discharged from the Urgent Care and sent to the Emergency Department via POV. Patient declined EMS transport Per Barkley Boards, NP, patient is in need of higher level of care due to SOB, Tachypnea, tachycardia. Patient is aware and verbalizes understanding of plan of care.  Vitals:   08/18/20 1547  BP: 103/66  Pulse: (!) 120  Resp: (!) 28  Temp: 97.6 F (36.4 C)  SpO2: 99%

## 2020-08-18 NOTE — ED Provider Notes (Signed)
[facility]    CSN: [id number] Arrival date & time: 08/18/20  1501      History   Chief Complaint Chief Complaint  Patient presents with  . Fatigue  . Cough  . Shortness of Breath    HPI Monique Hunt is a 61 y.o. female.   Patient presents with shortness of breath, congestion, cough x2 weeks.  She reports her blood thinner was stopped 1 month ago.  She denies focal weakness, numbness, chest pain, or other symptoms.  Patient was seen in the ED on 08/06/2020; diagnosed with chest pain, sore throat, hypoxia, CAD with angina; COVID negative; Chest CT and chest x-ray on 08/06/2020 showed no acute abnormality.  She had a video visit with her PCP on 08/12/2020; treated with albuterol inhaler; instructed to go to the ED due to hypoxia at home.  Her medical history includes NSTEMI, CAD, hypertension.  The history is provided by the patient and medical records.    Past Medical History:  Diagnosis Date  . Coronary artery disease   . Depression   . History of kidney stones   . Hyperlipidemia   . Hypertension   . Migraine    "stopped ~ 2010" (12/15/2017)  . NSTEMI (non-ST elevated myocardial infarction) ([place]) 12/15/2017    Patient Active Problem List   Diagnosis Date Noted  . Coronary artery disease involving native coronary artery of native heart with angina pectoris ([place]) 08/06/2020  . Low TSH level 08/04/2018  . Chronic diarrhea 06/05/2018  . Left thyroid nodule 04/07/2018  . Lung nodule, solitary 04/07/2018  . CAD (coronary artery disease), native coronary artery 12/19/2017  . Hyperlipidemia LDL goal <70   . NSTEMI (non-ST elevated myocardial infarction) ([place]) 12/15/2017  . Hypertension 12/01/2017  . Depression 06/04/2012  . Nephrolithiasis 09/27/2011    Past Surgical History:  Procedure Laterality Date  . CORONARY STENT INTERVENTION N/A 12/19/2017   Procedure: CORONARY STENT INTERVENTION;  Surgeon: Belva Crome, MD;  Location: [facility];  Service:  Cardiovascular;  Laterality: N/A;  . CYSTOSCOPY W/ STONE MANIPULATION    . FACIAL FRACTURE SURGERY  1979  . LEFT HEART CATH AND CORONARY ANGIOGRAPHY N/A 12/16/2017   Procedure: LEFT HEART CATH AND CORONARY ANGIOGRAPHY;  Surgeon: Belva Crome, MD;  Location: [facility];  Service: Cardiovascular;  Laterality: N/A;  . LEFT HEART CATH AND CORONARY ANGIOGRAPHY N/A 12/19/2019   Procedure: LEFT HEART CATH AND CORONARY ANGIOGRAPHY;  Surgeon: Lorretta Harp, MD;  Location: [facility];  Service: Cardiovascular;  Laterality: N/A;  . LITHOTRIPSY    . SKIN SURGERY Right    "birthmark removed under my arm when I was little"    OB History   No obstetric history on file.      Home Medications    Prior to Admission medications   Medication Sig Start Date End Date Taking? Authorizing Provider  amLODipine (NORVASC) 10 MG tablet Take 1 tablet (10 mg total) by mouth daily. 07/10/20  Yes Ladell Pier, MD  aspirin 81 MG chewable tablet Chew 1 tablet (81 mg total) by mouth daily. 12/21/17  Yes Simmons, Brittainy M, PA-C  dicyclomine (BENTYL) 10 MG capsule TAKE 2 CAPSULES (20 MG TOTAL) BY MOUTH EVERY 8 (EIGHT) HOURS AS NEEDED FOR SPASMS. FOR ABDOMINAL CRAMPS AND DIARRHEA 05/13/20  Yes Danis, Estill Cotta III, MD  esomeprazole (NEXIUM) 20 MG capsule Take 20 mg by mouth daily at 12 noon.   Yes [provider]  ezetimibe (  ZETIA) 10 MG tablet Take 1 tablet (10 mg total) by mouth daily. 11/01/19  Yes Ladell Pier, MD  isosorbide dinitrate (ISORDIL) 30 MG tablet Take 30 mg by mouth daily.   Yes [provider]  losartan (COZAAR) 100 MG tablet TAKE 1 TABLET BY MOUTH DAILY. 05/14/20  Yes Fay Records, MD  metoprolol succinate (TOPROL-XL) 50 MG 24 hr tablet Take 1 tablet (50 mg total) by mouth daily. 06/02/20  Yes Weaver, Scott T, PA-C  rosuvastatin (CRESTOR) 40 MG tablet Take 1 tablet (40 mg total) by mouth daily. 01/16/20  Yes Weaver, Scott T, PA-C  venlafaxine XR (EFFEXOR-XR) 75  MG 24 hr capsule TAKE 1 CAPSULE (75 MG TOTAL) BY MOUTH DAILY WITH BREAKFAST. 08/07/20  Yes Ladell Pier, MD  albuterol (VENTOLIN HFA) 108 (90 Base) MCG/ACT inhaler Inhale 2 puffs into the lungs every 4 (four) hours as needed for wheezing or shortness of breath. 08/12/20   Gildardo Pounds, NP  Evolocumab (REPATHA SURECLICK) 751 MG/ML SOAJ Inject 1 pen into the skin every 14 (fourteen) days.    [provider]  guaiFENesin (MUCINEX FAST-MAX CHEST CONG MS) 100 MG/5ML liquid Take 10 mLs (200 mg total) by mouth 3 (three) times daily as needed for cough. 08/18/20   Gildardo Pounds, NP  nitroGLYCERIN (NITROSTAT) 0.4 MG SL tablet Place 1 tablet (0.4 mg total) under the tongue every 5 (five) minutes x 3 doses as needed for chest pain. 12/18/19   Liliane Shi, PA-C    Family History Family History  Problem Relation Age of Onset  . CAD Father   . Heart disease Father   . Breast cancer Mother   . Hyperlipidemia Sister   . Hyperlipidemia Brother   . Colon cancer Neg Hx   . Esophageal cancer Neg Hx   . Rectal cancer Neg Hx   . Stomach cancer Neg Hx     Social History Social History   Tobacco Use  . Smoking status: Never Smoker  . Smokeless tobacco: Never Used  Vaping Use  . Vaping Use: Never used  Substance Use Topics  . Alcohol use: Yes    Comment: occ  . Drug use: Never     Allergies   Patient has no known allergies.   Review of Systems Review of Systems  Constitutional: Negative for chills and fever.  HENT: Positive for congestion. Negative for ear pain and sore throat.   Eyes: Negative for pain and visual disturbance.  Respiratory: Positive for cough and shortness of breath.   Cardiovascular: Negative for chest pain and palpitations.  Gastrointestinal: Negative for abdominal pain and vomiting.  Genitourinary: Negative for dysuria and hematuria.  Musculoskeletal: Negative for arthralgias and back pain.  Skin: Negative for color change and rash.  Neurological:  Negative for seizures and syncope.  All other systems reviewed and are negative.    Physical Exam Triage Vital Signs ED Triage Vitals  Enc Vitals Group     BP      Pulse      Resp      Temp      Temp src      SpO2      Weight      Height      Head Circumference      Peak Flow      Pain Score      Pain Loc      Pain Edu?      Excl. in [place]?    No  data found.  Updated Vital Signs BP 103/66 (BP Location: Right Arm)   Pulse (!) 120   Temp 97.6 F (36.4 C) (Oral)   Resp (!) 28   Ht 5\' 5"  (1.651 m)   Wt 150 lb (68 kg)   LMP 02/28/2011   SpO2 99%   BMI 24.96 kg/m   Visual Acuity Right Eye Distance:   Left Eye Distance:   Bilateral Distance:    Right Eye Near:   Left Eye Near:    Bilateral Near:     Physical Exam Vitals and nursing note reviewed.  Constitutional:      General: She is not in acute distress.    Appearance: She is well-developed.  HENT:     Head: Normocephalic and atraumatic.     Mouth/Throat:     Mouth: Mucous membranes are moist.  Eyes:     Conjunctiva/sclera: Conjunctivae normal.  Cardiovascular:     Rate and Rhythm: Regular rhythm. Tachycardia present.     Heart sounds: Normal heart sounds.  Pulmonary:     Effort: Pulmonary effort is normal. No respiratory distress.     Breath sounds: Normal breath sounds.     Comments: Tachypneic. Abdominal:     Palpations: Abdomen is soft.     Tenderness: There is no abdominal tenderness.  Musculoskeletal:     Cervical back: Neck supple.     Right lower leg: No edema.     Left lower leg: No edema.  Skin:    General: Skin is warm and dry.  Neurological:     General: No focal deficit present.     Mental Status: She is alert and oriented to person, place, and time.     Gait: Gait normal.  Psychiatric:        Mood and Affect: Mood normal.        Behavior: Behavior normal.      UC Treatments / Results  Labs (all labs ordered are listed, but only abnormal results are displayed) Labs Reviewed  - No data to display  EKG   Radiology No results found.  Procedures Procedures (including critical care time)  Medications Ordered in UC Medications - No data to display  Initial Impression / Assessment and Plan / UC Course  I have reviewed the triage vital signs and the nursing notes.  Pertinent labs & imaging results that were available during my care of the patient were reviewed by me and considered in my medical decision making (see chart for details).    Shortness of breath, tachypnea, tachycardia, abnormal EKG.  EKG shows sinus tachycardia, rate 120; inverted T waves in III, aVR, aVF; compared to previous from 08/06/2020.  Sending patient to the ED for evaluation.  She adamantly refuses EMS for transport.  She states she had a friend drive her here and that friend will take her to the ED.   Final Clinical Impressions(s) / UC Diagnoses   Final diagnoses:  Shortness of breath  Tachypnea  Tachycardia  Abnormal EKG   Discharge Instructions   None    ED Prescriptions    None     PDMP not reviewed this encounter.   Sharion Balloon, NP 08/18/20 [phone]

## 2020-08-26 MED FILL — ?EZETIMIBE 10MG TABLET: 10MG | 30 days supply | Qty: 30 | Fill #11

## 2020-08-26 MED FILL — LOSARTAN POTASSIUM 100 MG T: 100 | 30 days supply | Qty: 30 | Fill #4

## 2020-08-26 MED FILL — METOPROLOL SUCCINATE ER 50: 50 | 30 days supply | Qty: 30 | Fill #3

## 2020-08-26 MED FILL — ?CLOPIDOGREL 75MG TABL: 75 | 30 days supply | Qty: 30 | Fill #4

## 2020-08-26 MED FILL — ISOSORBIDE MN ER 60 MG TAB: 60 | 30 days supply | Qty: 30 | Fill #5

## 2020-08-30 ENCOUNTER — Other Ambulatory Visit: Payer: Self-pay

## 2020-09-05 ENCOUNTER — Ambulatory Visit: Payer: Self-pay | Admitting: Internal Medicine

## 2020-09-22 ENCOUNTER — Other Ambulatory Visit: Payer: Self-pay | Admitting: Gastroenterology

## 2020-09-22 ENCOUNTER — Other Ambulatory Visit: Payer: Self-pay

## 2020-09-22 ENCOUNTER — Other Ambulatory Visit: Payer: Self-pay | Admitting: *Deleted

## 2020-09-22 ENCOUNTER — Other Ambulatory Visit: Payer: Self-pay | Admitting: Nurse Practitioner

## 2020-09-22 ENCOUNTER — Other Ambulatory Visit: Payer: Self-pay | Admitting: Internal Medicine

## 2020-09-22 DIAGNOSIS — E7800 Pure hypercholesterolemia, unspecified: Secondary | ICD-10-CM

## 2020-09-22 DIAGNOSIS — R059 Cough, unspecified: Secondary | ICD-10-CM

## 2020-09-22 DIAGNOSIS — Z09 Encounter for follow-up examination after completed treatment for conditions other than malignant neoplasm: Secondary | ICD-10-CM

## 2020-09-22 DIAGNOSIS — F3342 Major depressive disorder, recurrent, in full remission: Secondary | ICD-10-CM

## 2020-09-22 LAB — HEPATIC FUNCTION PANEL
ALT: 23 IU/L (ref 0–32)
AST: 20 IU/L (ref 0–40)
Albumin: 4.6 g/dL (ref 3.8–4.9)
Alkaline Phosphatase: 110 IU/L (ref 44–121)
Bilirubin Total: 0.2 mg/dL (ref 0.0–1.2)
Bilirubin, Direct: 0.1 mg/dL (ref 0.00–0.40)
Total Protein: 7.2 g/dL (ref 6.0–8.5)

## 2020-09-22 LAB — LIPID PANEL
Chol/HDL Ratio: 2.6 ratio (ref 0.0–4.4)
Cholesterol, Total: 154 mg/dL (ref 100–199)
HDL: 59 mg/dL (ref >39)
LDL Chol Calc (NIH): 48 mg/dL (ref 0–99)
Triglycerides: 314 mg/dL — ABNORMAL HIGH (ref 0–149)
VLDL Cholesterol Cal: 47 mg/dL — ABNORMAL HIGH (ref 5–40)

## 2020-09-22 MED ORDER — ALBUTEROL SULFATE HFA 108 (90 BASE) MCG/ACT IN AERS
2.0000 | INHALATION_SPRAY | RESPIRATORY_TRACT | 0 refills | Status: DC | PRN
  Filled 2020-09-22: qty 18, 25d supply, fill #0

## 2020-09-22 MED ORDER — DICYCLOMINE HCL 10 MG PO CAPS
ORAL_CAPSULE | ORAL | 1 refills | Status: DC
  Filled 2020-09-22: qty 45, 7d supply, fill #0
  Filled 2020-11-18: qty 45, 7d supply, fill #1

## 2020-09-22 MED ORDER — VENLAFAXINE HCL ER 75 MG PO CP24
ORAL_CAPSULE | Freq: Every day | ORAL | 0 refills | Status: DC
  Filled 2020-09-22: qty 30, 30d supply, fill #0

## 2020-09-22 MED FILL — Metoprolol Succinate Tab ER 24HR 50 MG (Tartrate Equiv): ORAL | 30 days supply | Qty: 30 | Fill #0 | Status: AC

## 2020-09-22 MED FILL — Losartan Potassium Tab 100 MG: ORAL | 30 days supply | Qty: 30 | Fill #0 | Status: AC

## 2020-09-22 MED FILL — Amlodipine Besylate Tab 10 MG (Base Equivalent): ORAL | 30 days supply | Qty: 30 | Fill #0 | Status: AC

## 2020-09-22 NOTE — Telephone Encounter (Signed)
Requested Prescriptions  Pending Prescriptions Disp Refills  . albuterol (VENTOLIN HFA) 108 (90 Base) MCG/ACT inhaler 18 g 0    Sig: INHALE 2 PUFFS INTO THE LUNGS EVERY 4 (FOUR) HOURS AS NEEDED FOR WHEEZING OR SHORTNESS OF BREATH.     Pulmonology:  Beta Agonists Failed - 09/22/2020 10:36 AM      Failed - One inhaler should last at least one month. If the patient is requesting refills earlier, contact the patient to check for uncontrolled symptoms.      Passed - Valid encounter within last 12 months    Recent Outpatient Visits          1 month ago Hospital discharge follow-up   [facility] Gildardo Pounds, NP   6 months ago Need for influenza vaccination   [place], RPH-CPP   6 months ago Essential hypertension   [place], MD   10 months ago Essential hypertension   [place], MD   1 year ago Pap smear for cervical cancer screening   [place], MD      Future Appointments            In 1 month Wynetta Emery Dalbert Batman, MD [place]

## 2020-09-23 ENCOUNTER — Telehealth: Payer: Self-pay | Admitting: Pharmacist

## 2020-09-23 ENCOUNTER — Other Ambulatory Visit: Payer: Self-pay

## 2020-09-23 DIAGNOSIS — E785 Hyperlipidemia, unspecified: Secondary | ICD-10-CM

## 2020-09-23 NOTE — Telephone Encounter (Signed)
Left message for pt to discuss lipid results. LDL well controlled on Repatha 140mg  Q2W, rosuvastatin 40mg  daily, and ezetimibe 10mg  daily. Tried stopping fenofibrate recently since her TG had been well controlled, however they have quadrupled and are now back into the 300 range. Will recommend adding back on fenofibrate 160mg  daily if pt is willing.

## 2020-09-25 ENCOUNTER — Other Ambulatory Visit: Payer: Self-pay

## 2020-09-25 MED ORDER — ROSUVASTATIN CALCIUM 40 MG PO TABS
40.0000 mg | ORAL_TABLET | Freq: Every day | ORAL | 3 refills | Status: DC
  Filled 2020-09-25: qty 30, 30d supply, fill #0
  Filled 2020-10-20: qty 30, 30d supply, fill #1
  Filled 2020-11-18: qty 30, 30d supply, fill #2
  Filled 2020-12-12: qty 30, 30d supply, fill #3
  Filled 2021-01-07: qty 30, 30d supply, fill #4

## 2020-09-25 NOTE — Telephone Encounter (Signed)
3rd message left for pt. Also need to clarify why pt marked rosuvastatin 40mg  and ezetimibe 10mg  as not taking yesterday - she should still be on statin therapy along with her Repatha, but based on lab results would be ok to stop her ezetimibe. This may explain why TG increased more than expected after fibrate d/c if pt stopped her statin as well which she was not advised to do.

## 2020-09-25 NOTE — Telephone Encounter (Signed)
Patient returned call. She states that she did stop taking rosuvastatin and ezetimibe. I advised her that she needs to resume rosuvastatin to help lower her TG and for best cardiovascular risk reduction with Repatha. [facility] Patient agreeable to resume. Previous labs were also not fasting which also explains why TG are higher. Recheck labs 7/25

## 2020-09-25 NOTE — Addendum Note (Signed)
Addended by: Aalaysia Liggins E on: 09/25/2020 11:57 AM   Modules accepted: Orders

## 2020-09-26 ENCOUNTER — Other Ambulatory Visit: Payer: Self-pay

## 2020-10-03 ENCOUNTER — Telehealth: Payer: Self-pay | Admitting: Internal Medicine

## 2020-10-03 NOTE — Telephone Encounter (Signed)
FMLA will be given to provider at pt appt. She will need to keep that appt with provider

## 2020-10-03 NOTE — Telephone Encounter (Signed)
Patient came by to have Dr. Wynetta Emery fill out FMLA forms for her job. Advised Pt that she has not been seen in person by [facility] since last year and she would need to wait until her appt to discuss forms. Pt asked for forms to still be given to Provider. Pt appt is June 7th.

## 2020-10-20 ENCOUNTER — Other Ambulatory Visit: Payer: Self-pay

## 2020-10-20 ENCOUNTER — Other Ambulatory Visit: Payer: Self-pay | Admitting: Internal Medicine

## 2020-10-20 DIAGNOSIS — F3342 Major depressive disorder, recurrent, in full remission: Secondary | ICD-10-CM

## 2020-10-20 DIAGNOSIS — I10 Essential (primary) hypertension: Secondary | ICD-10-CM

## 2020-10-20 MED ORDER — AMLODIPINE BESYLATE 10 MG PO TABS
ORAL_TABLET | Freq: Every day | ORAL | 0 refills | Status: DC
  Filled 2020-10-20: qty 30, 30d supply, fill #0
  Filled 2020-11-18: qty 30, 30d supply, fill #1
  Filled 2020-12-12: qty 30, 30d supply, fill #2

## 2020-10-20 MED ORDER — VENLAFAXINE HCL ER 75 MG PO CP24
ORAL_CAPSULE | Freq: Every day | ORAL | 0 refills | Status: DC
  Filled 2020-10-20: qty 30, 30d supply, fill #0

## 2020-10-20 MED FILL — Metoprolol Succinate Tab ER 24HR 50 MG (Tartrate Equiv): ORAL | 30 days supply | Qty: 30 | Fill #1 | Status: AC

## 2020-10-20 MED FILL — Losartan Potassium Tab 100 MG: ORAL | 30 days supply | Qty: 30 | Fill #1 | Status: AC

## 2020-11-04 ENCOUNTER — Ambulatory Visit: Payer: Self-pay | Attending: Internal Medicine | Admitting: Internal Medicine

## 2020-11-04 ENCOUNTER — Other Ambulatory Visit: Payer: Self-pay

## 2020-11-04 ENCOUNTER — Encounter: Payer: Self-pay | Admitting: Internal Medicine

## 2020-11-04 VITALS — BP 143/89 | HR 82 | Resp 16 | Wt 150.4 lb

## 2020-11-04 DIAGNOSIS — Z23 Encounter for immunization: Secondary | ICD-10-CM

## 2020-11-04 DIAGNOSIS — E663 Overweight: Secondary | ICD-10-CM

## 2020-11-04 DIAGNOSIS — I2510 Atherosclerotic heart disease of native coronary artery without angina pectoris: Secondary | ICD-10-CM

## 2020-11-04 DIAGNOSIS — I10 Essential (primary) hypertension: Secondary | ICD-10-CM

## 2020-11-04 DIAGNOSIS — R911 Solitary pulmonary nodule: Secondary | ICD-10-CM

## 2020-11-04 MED ORDER — ISOSORBIDE MONONITRATE ER 30 MG PO TB24
30.0000 mg | ORAL_TABLET | Freq: Every day | ORAL | 4 refills | Status: DC
  Filled 2020-11-04: qty 30, 30d supply, fill #0

## 2020-11-04 NOTE — Progress Notes (Signed)
Patient ID: Monique Hunt, female    DOB: Jun 28, 1960  MRN: [id number]  CC: Hypertension   Subjective: Monique Hunt is a 60 y.o. female who presents for chronic ds management Her concerns today include:  Patient with history of HTN, CADone stent to LAD 11/2017, depression, HL, LT thyroid nodule(no further f/u needed per endocrinology Dr. Sherene Sires lung nodule  HYPERTENSION/CAD/HL Currently taking: see medication list.  She stopped Imdur 60 mg because it causes palpitations when she takes it.  Off x 2 mths.  Tolerated the 30 mg Imdur better.  Compliant with other medications including amlodipine, Cozaar, metoprolol, Crestor and Repatha. Med Adherence: [x]  Yes    []  No Medication side effects: See discussion above regarding Imdur. Adherence with salt restriction: [x]  Yes    []  No Home Monitoring?: []  Yes    [x]  No but does have device  Monitoring Frequency: []  Yes    []  No Home BP results range: []  Yes    []  No SOB? []  Yes    [x]  No Chest Pain?: []  Yes    [x]  No Leg swelling?: [x]  Yes  -if standing for long periods Headaches?: []  Yes    [x]  No Dizziness? []  Yes    [x]  No Comments:   Lung nodule: Seen in the emergency room back in March with respiratory symptoms.  Had a CAT scan of the chest.  This showed that the nodular density in the right middle lobe is stable in size and likely benign.  No further radiologic follow-up recommended.   Never smoker  Wants to loss wgh.  Changed her diet significantly.  Used to eat fast food at least twice a day and has cut back on that.  Now if she goes to a [employer] she gets a salad.  She stopped eating red meat.  Does not drink alcoholic beverages as much as she used to.  Eating more fruits and vegetables and drinking more water.  However she does not exercise.  States she is too tired when she gets off work.  HM: Due for pneumonia vaccine 20, Shingrix and Tdap, Patient Active Problem List   Diagnosis Date Noted  .  Coronary artery disease involving native coronary artery of native heart with angina pectoris ([place]) 08/06/2020  . Low TSH level 08/04/2018  . Chronic diarrhea 06/05/2018  . Left thyroid nodule 04/07/2018  . Lung nodule, solitary 04/07/2018  . CAD (coronary artery disease), native coronary artery 12/19/2017  . Hyperlipidemia LDL goal <70   . NSTEMI (non-ST elevated myocardial infarction) ([place]) 12/15/2017  . Hypertension 12/01/2017  . Depression 06/04/2012  . Nephrolithiasis 09/27/2011     Current Outpatient Medications on File Prior to Visit  Medication Sig Dispense Refill  . albuterol (VENTOLIN HFA) 108 (90 Base) MCG/ACT inhaler INHALE 2 PUFFS INTO THE LUNGS EVERY 4 (FOUR) HOURS AS NEEDED FOR WHEEZING OR SHORTNESS OF BREATH. 18 g 0  . amLODipine (NORVASC) 10 MG tablet TAKE 1 TABLET (10 MG TOTAL) BY MOUTH DAILY. 90 tablet 0  . aspirin 81 MG chewable tablet Chew 1 tablet (81 mg total) by mouth daily. 30 tablet 11  . dicyclomine (BENTYL) 10 MG capsule TAKE 2 CAPSULES (20 MG TOTAL) BY MOUTH EVERY 8 (EIGHT) HOURS AS NEEDED FOR SPASMS. FOR ABDOMINAL CRAMPS AND DIARRHEA 45 capsule 1  . esomeprazole (NEXIUM) 20 MG capsule Take 20 mg by mouth daily at 12 noon.    . Evolocumab (REPATHA SURECLICK) 536 MG/ML SOAJ Inject 1 pen into the skin  every 14 (fourteen) days.    Marland Kitchen losartan (COZAAR) 100 MG tablet TAKE 1 TABLET BY MOUTH DAILY. 30 tablet 8  . metoprolol succinate (TOPROL-XL) 50 MG 24 hr tablet TAKE 1 TABLET (50 MG TOTAL) BY MOUTH DAILY. 30 tablet 5  . nitroGLYCERIN (NITROSTAT) 0.4 MG SL tablet Place 1 tablet (0.4 mg total) under the tongue every 5 (five) minutes x 3 doses as needed for chest pain. 30 tablet 3  . rosuvastatin (CRESTOR) 40 MG tablet Take 1 tablet (40 mg total) by mouth daily. 90 tablet 3  . venlafaxine XR (EFFEXOR-XR) 75 MG 24 hr capsule TAKE 1 CAPSULE (75 MG TOTAL) BY MOUTH DAILY WITH BREAKFAST. 30 capsule 0   No current facility-administered medications on file prior to visit.     No Known Allergies  Social History   Socioeconomic History  . Marital status: Divorced    Spouse name: Not on file  . Number of children: 2  . Years of education: Not on file  . Highest education level: Not on file  Occupational History  . Not on file  Tobacco Use  . Smoking status: Never Smoker  . Smokeless tobacco: Never Used  Vaping Use  . Vaping Use: Never used  Substance and Sexual Activity  . Alcohol use: Yes    Comment: 3 TIMES A WEEK  . Drug use: Never  . Sexual activity: Yes    Comment: menopausal  Other Topics Concern  . Not on file  Social History Narrative  . Not on file   Social Determinants of Health   Financial Resource Strain: Not on file  Food Insecurity: Not on file  Transportation Needs: Not on file  Physical Activity: Not on file  Stress: Not on file  Social Connections: Not on file  Intimate Partner Violence: Not on file    Family History  Problem Relation Age of Onset  . CAD Father   . Heart disease Father   . Breast cancer Mother   . Hyperlipidemia Sister   . Hyperlipidemia Brother   . Colon cancer Neg Hx   . Esophageal cancer Neg Hx   . Rectal cancer Neg Hx   . Stomach cancer Neg Hx     Past Surgical History:  Procedure Laterality Date  . CORONARY STENT INTERVENTION N/A 12/19/2017   Procedure: CORONARY STENT INTERVENTION;  Surgeon: Belva Crome, MD;  Location: [facility];  Service: Cardiovascular;  Laterality: N/A;  . CYSTOSCOPY W/ STONE MANIPULATION    . FACIAL FRACTURE SURGERY  1979  . LEFT HEART CATH AND CORONARY ANGIOGRAPHY N/A 12/16/2017   Procedure: LEFT HEART CATH AND CORONARY ANGIOGRAPHY;  Surgeon: Belva Crome, MD;  Location: [facility];  Service: Cardiovascular;  Laterality: N/A;  . LEFT HEART CATH AND CORONARY ANGIOGRAPHY N/A 12/19/2019   Procedure: LEFT HEART CATH AND CORONARY ANGIOGRAPHY;  Surgeon: Lorretta Harp, MD;  Location: [facility];  Service: Cardiovascular;  Laterality: N/A;   . LITHOTRIPSY    . SKIN SURGERY Right    "birthmark removed under my arm when I was little"    ROS: Review of Systems Negative except as stated above  PHYSICAL EXAM: BP (!) 143/89   Pulse 82   Resp 16   Wt 150 lb 6.4 oz (68.2 kg)   LMP 02/28/2011   SpO2 100%   BMI 25.03 kg/m   Wt Readings from Last 3 Encounters:  11/04/20 150 lb 6.4 oz (68.2 kg)  08/18/20 150 lb (68  kg)  08/18/20 150 lb (68 kg)    Physical Exam  General appearance - alert, well appearing, and in no distress Mental status - normal mood, behavior, speech, dress, motor activity, and thought processes Neck - supple, no significant adenopathy Chest - clear to auscultation, no wheezes, rales or rhonchi, symmetric air entry Heart - normal rate, regular rhythm, normal S1, S2, no murmurs, rubs, clicks or gallops Extremities - peripheral pulses normal, no pedal edema, no clubbing or cyanosis   Depression screen [facility] 2/9 11/04/2020 06/05/2018 05/10/2018  Decreased Interest 0 0 0  Down, Depressed, Hopeless 0 0 0  PHQ - 2 Score 0 0 0    CMP Latest Ref Rng & Units 09/22/2020 08/18/2020 08/06/2020  Glucose 70 - 99 mg/dL - 130(H) 108(H)  BUN 6 - 20 mg/dL - 29(H) 22(H)  Creatinine 0.44 - 1.00 mg/dL - 1.42(H) 0.72  Sodium 135 - 145 mmol/L - 134(L) 137  Potassium 3.5 - 5.1 mmol/L - 4.1 4.0  Chloride 98 - 111 mmol/L - 99 103  CO2 22 - 32 mmol/L - 22 24  Calcium 8.9 - 10.3 mg/dL - 10.0 10.2  Total Protein 6.0 - 8.5 g/dL 7.2 - -  Total Bilirubin 0.0 - 1.2 mg/dL 0.2 - -  Alkaline Phos 44 - 121 IU/L 110 - -  AST 0 - 40 IU/L 20 - -  ALT 0 - 32 IU/L 23 - -   Lipid Panel     Component Value Date/Time   CHOL 154 09/22/2020 1044   TRIG 314 (H) 09/22/2020 1044   HDL 59 09/22/2020 1044   CHOLHDL 2.6 09/22/2020 1044   CHOLHDL 7.2 12/16/2017 0714   VLDL 79 (H) 12/16/2017 0714   LDLCALC 48 09/22/2020 1044    CBC    Component Value Date/Time   WBC 7.7 08/18/2020 1703   RBC 4.85 08/18/2020 1703   HGB 14.7 08/18/2020 1703    HGB 12.7 12/18/2019 0900   HCT 44.5 08/18/2020 1703   HCT 38.6 12/18/2019 0900   PLT 470 (H) 08/18/2020 1703   PLT 451 (H) 12/18/2019 0900   MCV 91.8 08/18/2020 1703   MCV 94 12/18/2019 0900   MCH 30.3 08/18/2020 1703   MCHC 33.0 08/18/2020 1703   RDW 13.7 08/18/2020 1703   RDW 13.1 12/18/2019 0900   LYMPHSABS 1.3 05/10/2018 1218   MONOABS 0.6 02/10/2018 1741   EOSABS 0.1 05/10/2018 1218   BASOSABS 0.1 05/10/2018 1218    ASSESSMENT AND PLAN: 1. Essential hypertension Not at goal.  She will continue metoprolol, Norvasc, Cozaar.  Recommend decreasing the Imdur to 30 mg since she tolerated that better than the increased dose.  She is willing to give it a try.  She will let me know if it causes palpitations. - Basic Metabolic Panel - isosorbide mononitrate (IMDUR) 30 MG 24 hr tablet; Take 1 tablet (30 mg total) by mouth daily.  Dispense: 30 tablet; Refill: 4  2. Coronary artery disease involving native coronary artery of native heart without angina pectoris Stable.  No recent use of nitroglycerin.  Continue current medications including Repatha and Crestor. - isosorbide mononitrate (IMDUR) 30 MG 24 hr tablet; Take 1 tablet (30 mg total) by mouth daily.  Dispense: 30 tablet; Refill: 4  3. Lung nodule, solitary Stable in size per radiology.  No further radiologic follow-up necessary  4. Overweight (BMI 25.0-29.9) Dietary counseling given.  I have also printed information for her.  Refer to nutritionist. Encouraged her to try to get in  some exercise but start low and go slow.  She should always keep her nitroglycerin SL with her. - Hemoglobin A1c - Amb ref to Medical Nutrition Therapy-MNT  5. Need for vaccination against Streptococcus pneumoniae Prevar 20 to be given by CMA Pollick.  6. Need for Tdap vaccination To be given today by CMA Pollick    Patient was given the opportunity to ask questions.  Patient verbalized understanding of the plan and was able to repeat key  elements of the plan.   Orders Placed This Encounter  Procedures  . Basic Metabolic Panel  . Hemoglobin A1c  . Amb ref to Medical Nutrition Therapy-MNT     Requested Prescriptions   Signed Prescriptions Disp Refills  . isosorbide mononitrate (IMDUR) 30 MG 24 hr tablet 30 tablet 4    Sig: Take 1 tablet (30 mg total) by mouth daily.    Return in about 4 months (around 03/06/2021).  Karle Plumber, MD, FACP

## 2020-11-04 NOTE — Patient Instructions (Signed)
We have changed the dose of the Imdur (Isosorbide) from 60 mg daily to 30 mg daily.  I have referred you to the nutritionist.  They will call you with that appointment.   Healthy Eating Following a healthy eating pattern may help you to achieve and maintain a healthy body weight, reduce the risk of chronic disease, and live a long and productive life. It is important to follow a healthy eating pattern at an appropriate calorie level for your body. Your nutritional needs should be met primarily through food by choosing a variety of nutrient-rich foods. What are tips for following this plan? Reading food labels  Read labels and choose the following: ? Reduced or low sodium. ? Juices with 100% fruit juice. ? Foods with low saturated fats and high polyunsaturated and monounsaturated fats. ? Foods with whole grains, such as whole wheat, cracked wheat, brown rice, and wild rice. ? Whole grains that are fortified with folic acid. This is recommended for women who are pregnant or who want to become pregnant.  Read labels and avoid the following: ? Foods with a lot of added sugars. These include foods that contain brown sugar, corn sweetener, corn syrup, dextrose, fructose, glucose, high-fructose corn syrup, honey, invert sugar, lactose, malt syrup, maltose, molasses, raw sugar, sucrose, trehalose, or turbinado sugar.  Do not eat more than the following amounts of added sugar per day:  6 teaspoons (25 g) for women.  9 teaspoons (38 g) for men. ? Foods that contain processed or refined starches and grains. ? Refined grain products, such as white flour, degermed cornmeal, white bread, and white rice. Shopping  Choose nutrient-rich snacks, such as vegetables, whole fruits, and nuts. Avoid high-calorie and high-sugar snacks, such as potato chips, fruit snacks, and candy.  Use oil-based dressings and spreads on foods instead of solid fats such as butter, stick margarine, or cream cheese.  Limit  pre-made sauces, mixes, and "instant" products such as flavored rice, instant noodles, and ready-made pasta.  Try more plant-protein sources, such as tofu, tempeh, black beans, edamame, lentils, nuts, and seeds.  Explore eating plans such as the Mediterranean diet or vegetarian diet. Cooking  Use oil to saut or stir-fry foods instead of solid fats such as butter, stick margarine, or lard.  Try baking, boiling, grilling, or broiling instead of frying.  Remove the fatty part of meats before cooking.  Steam vegetables in water or broth. Meal planning  At meals, imagine dividing your plate into fourths: ? One-half of your plate is fruits and vegetables. ? One-fourth of your plate is whole grains. ? One-fourth of your plate is protein, especially lean meats, poultry, eggs, tofu, beans, or nuts.  Include low-fat dairy as part of your daily diet.   Lifestyle  Choose healthy options in all settings, including home, work, school, restaurants, or stores.  Prepare your food safely: ? Wash your hands after handling raw meats. ? Keep food preparation surfaces clean by regularly washing with hot, soapy water. ? Keep raw meats separate from ready-to-eat foods, such as fruits and vegetables. ? Cook seafood, meat, poultry, and eggs to the recommended internal temperature. ? Store foods at safe temperatures. In general:  Keep cold foods at 48F (4.4C) or below.  Keep hot foods at 148F (60C) or above.  Keep your freezer at [place] (-17.8C) or below.  Foods are no longer safe to eat when they have been between the temperatures of 40-148F (4.4-60C) for more than 2 hours. What foods should I eat?  Fruits Aim to eat 2 cup-equivalents of fresh, canned (in natural juice), or frozen fruits each day. Examples of 1 cup-equivalent of fruit include 1 small apple, 8 large strawberries, 1 cup canned fruit,  cup dried fruit, or 1 cup 100% juice. Vegetables Aim to eat 2-3 cup-equivalents of fresh and  frozen vegetables each day, including different varieties and colors. Examples of 1 cup-equivalent of vegetables include 2 medium carrots, 2 cups raw, leafy greens, 1 cup chopped vegetable (raw or cooked), or 1 medium baked potato. Grains Aim to eat 6 ounce-equivalents of whole grains each day. Examples of 1 ounce-equivalent of grains include 1 slice of bread, 1 cup ready-to-eat cereal, 3 cups popcorn, or  cup cooked rice, pasta, or cereal. Meats and other proteins Aim to eat 5-6 ounce-equivalents of protein each day. Examples of 1 ounce-equivalent of protein include 1 egg, 1/2 cup nuts or seeds, or 1 tablespoon (16 g) peanut butter. A cut of meat or fish that is the size of a deck of cards is about 3-4 ounce-equivalents.  Of the protein you eat each week, try to have at least 8 ounces come from seafood. This includes salmon, trout, herring, and anchovies. Dairy Aim to eat 3 cup-equivalents of fat-free or low-fat dairy each day. Examples of 1 cup-equivalent of dairy include 1 cup (240 mL) milk, 8 ounces (250 g) yogurt, 1 ounces (44 g) natural cheese, or 1 cup (240 mL) fortified soy milk. Fats and oils  Aim for about 5 teaspoons (21 g) per day. Choose monounsaturated fats, such as canola and olive oils, avocados, peanut butter, and most nuts, or polyunsaturated fats, such as sunflower, corn, and soybean oils, walnuts, pine nuts, sesame seeds, sunflower seeds, and flaxseed. Beverages  Aim for six 8-oz glasses of water per day. Limit coffee to three to five 8-oz cups per day.  Limit caffeinated beverages that have added calories, such as soda and energy drinks.  Limit alcohol intake to no more than 1 drink a day for nonpregnant women and 2 drinks a day for men. One drink equals 12 oz of beer (355 mL), 5 oz of wine (148 mL), or 1 oz of hard liquor (44 mL). Seasoning and other foods  Avoid adding excess amounts of salt to your foods. Try flavoring foods with herbs and spices instead of  salt.  Avoid adding sugar to foods.  Try using oil-based dressings, sauces, and spreads instead of solid fats. This information is based on general [place] nutrition guidelines. For more information, visit [employer].es. Exact amounts may vary based on your nutrition needs. Summary  A healthy eating plan may help you to maintain a healthy weight, reduce the risk of chronic diseases, and stay active throughout your life.  Plan your meals. Make sure you eat the right portions of a variety of nutrient-rich foods.  Try baking, boiling, grilling, or broiling instead of frying.  Choose healthy options in all settings, including home, work, school, restaurants, or stores. This information is not intended to replace advice given to you by your health care provider. Make sure you discuss any questions you have with your health care provider. Document Revised: 08/29/2017 Document Reviewed: 08/29/2017 Elsevier Patient Education  [place].

## 2020-11-05 ENCOUNTER — Encounter: Payer: Self-pay | Admitting: Internal Medicine

## 2020-11-05 DIAGNOSIS — R7303 Prediabetes: Secondary | ICD-10-CM | POA: Insufficient documentation

## 2020-11-05 LAB — BASIC METABOLIC PANEL
BUN/Creatinine Ratio: 32 — ABNORMAL HIGH (ref 12–28)
BUN: 25 mg/dL (ref 8–27)
CO2: 22 mmol/L (ref 20–29)
Calcium: 10 mg/dL (ref 8.7–10.3)
Chloride: 101 mmol/L (ref 96–106)
Creatinine, Ser: 0.79 mg/dL (ref 0.57–1.00)
Glucose: 92 mg/dL (ref 65–99)
Potassium: 4.3 mmol/L (ref 3.5–5.2)
Sodium: 138 mmol/L (ref 134–144)
eGFR: 86 mL/min/{1.73_m2} (ref >59)

## 2020-11-05 LAB — HEMOGLOBIN A1C
Est. average glucose Bld gHb Est-mCnc: 126 mg/dL
Hgb A1c MFr Bld: 6 % — ABNORMAL HIGH (ref 4.8–5.6)

## 2020-11-11 ENCOUNTER — Other Ambulatory Visit: Payer: Self-pay

## 2020-11-18 ENCOUNTER — Other Ambulatory Visit: Payer: Self-pay | Admitting: Physician Assistant

## 2020-11-18 ENCOUNTER — Other Ambulatory Visit: Payer: Self-pay | Admitting: Internal Medicine

## 2020-11-18 ENCOUNTER — Other Ambulatory Visit: Payer: Self-pay

## 2020-11-18 DIAGNOSIS — F3342 Major depressive disorder, recurrent, in full remission: Secondary | ICD-10-CM

## 2020-11-18 DIAGNOSIS — R059 Cough, unspecified: Secondary | ICD-10-CM

## 2020-11-18 DIAGNOSIS — Z09 Encounter for follow-up examination after completed treatment for conditions other than malignant neoplasm: Secondary | ICD-10-CM

## 2020-11-18 MED ORDER — VENLAFAXINE HCL ER 75 MG PO CP24
ORAL_CAPSULE | Freq: Every day | ORAL | 0 refills | Status: DC
  Filled 2020-11-18: qty 30, 30d supply, fill #0

## 2020-11-18 MED ORDER — METOPROLOL SUCCINATE ER 50 MG PO TB24
ORAL_TABLET | Freq: Every day | ORAL | 6 refills | Status: DC
  Filled 2020-11-18: qty 30, 30d supply, fill #0
  Filled 2020-12-12: qty 30, 30d supply, fill #1
  Filled 2021-01-07: qty 30, 30d supply, fill #2
  Filled 2021-02-09: qty 30, 30d supply, fill #3

## 2020-11-18 MED ORDER — ALBUTEROL SULFATE HFA 108 (90 BASE) MCG/ACT IN AERS
2.0000 | INHALATION_SPRAY | RESPIRATORY_TRACT | 0 refills | Status: DC | PRN
  Filled 2020-11-18: qty 18, 25d supply, fill #0

## 2020-11-18 MED FILL — Losartan Potassium Tab 100 MG: ORAL | 30 days supply | Qty: 30 | Fill #2 | Status: AC

## 2020-11-19 ENCOUNTER — Other Ambulatory Visit: Payer: Self-pay

## 2020-11-19 MED FILL — Losartan Potassium Tab 100 MG: ORAL | 30 days supply | Qty: 30 | Fill #3 | Status: CN

## 2020-11-20 ENCOUNTER — Other Ambulatory Visit: Payer: Self-pay

## 2020-12-12 ENCOUNTER — Other Ambulatory Visit: Payer: Self-pay | Admitting: Internal Medicine

## 2020-12-12 DIAGNOSIS — F3342 Major depressive disorder, recurrent, in full remission: Secondary | ICD-10-CM

## 2020-12-12 MED FILL — Losartan Potassium Tab 100 MG: ORAL | 30 days supply | Qty: 30 | Fill #3 | Status: AC

## 2020-12-13 MED ORDER — VENLAFAXINE HCL ER 75 MG PO CP24
ORAL_CAPSULE | Freq: Every day | ORAL | 0 refills | Status: DC
  Filled 2020-12-13: qty 30, 30d supply, fill #0

## 2020-12-13 NOTE — Telephone Encounter (Signed)
Requested Prescriptions  Pending Prescriptions Disp Refills  . venlafaxine XR (EFFEXOR-XR) 75 MG 24 hr capsule 30 capsule 0    Sig: TAKE 1 CAPSULE (75 MG TOTAL) BY MOUTH DAILY WITH BREAKFAST.     Psychiatry: Antidepressants - SNRI - desvenlafaxine & venlafaxine Failed - 12/12/2020  5:53 PM      Failed - Triglycerides in normal range and within 360 days    Triglycerides  Date Value Ref Range Status  09/22/2020 314 (H) 0 - 149 mg/dL Final         Failed - Last BP in normal range    BP Readings from Last 1 Encounters:  11/04/20 (!) 143/89         Passed - LDL in normal range and within 360 days    LDL Chol Calc (NIH)  Date Value Ref Range Status  09/22/2020 48 0 - 99 mg/dL Final         Passed - Total Cholesterol in normal range and within 360 days    Cholesterol, Total  Date Value Ref Range Status  09/22/2020 154 100 - 199 mg/dL Final         Passed - Completed PHQ-2 or PHQ-9 in the last 360 days      Passed - Valid encounter within last 6 months    Recent Outpatient Visits          1 month ago Essential hypertension   [place], Deborah B, MD   4 months ago Hospital discharge follow-up   [place], Zelda W, NP   9 months ago Need for influenza vaccination   [place], Jarome Matin, RPH-CPP   9 months ago Essential hypertension   [place], Deborah B, MD   1 year ago Essential hypertension   [place], MD      Future Appointments            In 2 months Wynetta Emery Dalbert Batman, MD [place]

## 2020-12-15 ENCOUNTER — Other Ambulatory Visit: Payer: Self-pay

## 2020-12-22 ENCOUNTER — Other Ambulatory Visit: Payer: Self-pay

## 2020-12-24 ENCOUNTER — Telehealth: Payer: Self-pay | Admitting: Pharmacist

## 2020-12-24 NOTE — Telephone Encounter (Signed)
Pt missed lab appt to check lipids on Repatha and rosuvastatin '40mg'$  daily. Left message to r/s.

## 2021-01-05 NOTE — Telephone Encounter (Signed)
2nd message left for pt to r/s lab appt.

## 2021-01-06 ENCOUNTER — Other Ambulatory Visit: Payer: Self-pay

## 2021-01-06 DIAGNOSIS — E785 Hyperlipidemia, unspecified: Secondary | ICD-10-CM

## 2021-01-06 LAB — LIPID PANEL
Chol/HDL Ratio: 1.6 ratio (ref 0.0–4.4)
Cholesterol, Total: 90 mg/dL — ABNORMAL LOW (ref 100–199)
HDL: 55 mg/dL (ref >39)
LDL Chol Calc (NIH): 3 mg/dL (ref 0–99)
Triglycerides: 219 mg/dL — ABNORMAL HIGH (ref 0–149)
VLDL Cholesterol Cal: 32 mg/dL (ref 5–40)

## 2021-01-07 ENCOUNTER — Other Ambulatory Visit: Payer: Self-pay

## 2021-01-07 ENCOUNTER — Other Ambulatory Visit: Payer: Self-pay | Admitting: Internal Medicine

## 2021-01-07 DIAGNOSIS — I10 Essential (primary) hypertension: Secondary | ICD-10-CM

## 2021-01-07 DIAGNOSIS — F3342 Major depressive disorder, recurrent, in full remission: Secondary | ICD-10-CM

## 2021-01-07 MED ORDER — VENLAFAXINE HCL ER 75 MG PO CP24
75.0000 mg | ORAL_CAPSULE | Freq: Every day | ORAL | 2 refills | Status: DC
  Filled 2021-01-07: qty 30, 30d supply, fill #0
  Filled 2021-02-09: qty 30, 30d supply, fill #1
  Filled 2021-03-08: qty 30, 30d supply, fill #2

## 2021-01-07 MED ORDER — LOSARTAN POTASSIUM 100 MG PO TABS
ORAL_TABLET | Freq: Every day | ORAL | 0 refills | Status: DC
Start: 2021-01-07 — End: 2021-02-09
  Filled 2021-01-07: qty 30, 30d supply, fill #0

## 2021-01-07 MED ORDER — AMLODIPINE BESYLATE 10 MG PO TABS
10.0000 mg | ORAL_TABLET | Freq: Every day | ORAL | 2 refills | Status: DC
Start: 2021-01-07 — End: 2021-04-05
  Filled 2021-01-07: qty 30, 30d supply, fill #0
  Filled 2021-02-09: qty 30, 30d supply, fill #1
  Filled 2021-03-08: qty 30, 30d supply, fill #2

## 2021-01-07 NOTE — Telephone Encounter (Signed)
Requested medication (s) are due for refill today: no  Requested medication (s) are on the active medication list: yes   Last refill: 12/15/20  Future visit scheduled: yes   Notes to clinic:  Patient schedule for follow up on  03/06/2021   Failed protocol:  Total Cholesterol in normal range and within 360 days   Triglycerides in normal range and within 360 days   Last BP in normal range     Requested Prescriptions  Pending Prescriptions Disp Refills   amLODipine (NORVASC) 10 MG tablet 90 tablet 0    Sig: TAKE 1 TABLET (10 MG TOTAL) BY MOUTH DAILY.      Cardiovascular:  Calcium Channel Blockers Failed - 01/07/2021  9:37 AM      Failed - Last BP in normal range    BP Readings from Last 1 Encounters:  11/04/20 (!) 143/89          Passed - Valid encounter within last 6 months    Recent Outpatient Visits           2 months ago Essential hypertension   [place], Deborah B, MD   4 months ago Hospital discharge follow-up   [place] Gildardo Pounds, NP   10 months ago Need for influenza vaccination   [place], RPH-CPP   10 months ago Essential hypertension   [place], Deborah B, MD   1 year ago Essential hypertension   [place], MD       Future Appointments             In 1 month Ladell Pier, MD [place]               venlafaxine XR (EFFEXOR-XR) 75 MG 24 hr capsule 30 capsule 0    Sig: TAKE 1 CAPSULE (75 MG TOTAL) BY MOUTH DAILY WITH BREAKFAST.      Psychiatry: Antidepressants - SNRI - desvenlafaxine & venlafaxine Failed - 01/07/2021  9:37 AM      Failed - Total Cholesterol in normal range and within 360 days    Cholesterol, Total  Date Value Ref Range Status  01/06/2021 90 (L) 100 - 199 mg/dL Final           Failed - Triglycerides in normal range and within 360 days    Triglycerides  Date Value Ref Range Status  01/06/2021 219 (H) 0 - 149 mg/dL Final          Failed - Last BP in normal range    BP Readings from Last 1 Encounters:  11/04/20 (!) 143/89          Passed - LDL in normal range and within 360 days    LDL Chol Calc (NIH)  Date Value Ref Range Status  01/06/2021 3 0 - 99 mg/dL Final          Passed - Completed PHQ-2 or PHQ-9 in the last 360 days      Passed - Valid encounter within last 6 months    Recent Outpatient Visits           2 months ago Essential hypertension   [place], Deborah B, MD   4 months ago Hospital discharge follow-up   [place], [place]  W, NP   10 months ago Need for influenza vaccination   [place], Jarome Matin, RPH-CPP   10 months ago Essential hypertension   [place], Deborah B, MD   1 year ago Essential hypertension   [place], MD       Future Appointments             In 1 month Wynetta Emery, Dalbert Batman, MD [place]

## 2021-01-08 ENCOUNTER — Telehealth: Payer: Self-pay | Admitting: Pharmacist

## 2021-01-08 NOTE — Telephone Encounter (Signed)
Pt returned call. States she was fasting when labs were drawn but that she's drinking a lot of chocolate milk and eating a lot of chocolate. She is aware to decrease intake of these to help reduce her TG. Will continue current meds.

## 2021-01-08 NOTE — Telephone Encounter (Signed)
Lipid panel checked on Repatha '140mg'$  Q2W and rosuvastatin '40mg'$  daily. Ezetimibe previously stopped since she did not need this to keep her LDL controlled.  LDL remains well controlled at 3 (likely falsely low due to elevated TG, recommend continuing Repatha and rosuvastatin). Receiving Repatha from [employer]. LDL goal < 55 due to hx of premature ASCVD with NSTEMI in 2019.  TG remain elevated - historically better controlled, were elevated in April 2022 when pt wasn't fasting for labs. She did used to take fenofibrate in the past as TG were > 400 at one time. Pt also now prediabetic which may be contributing. Will see if pt was fasting when she returns call and discuss diet as well. Can consider resuming fenofibrate if needed (would prefer Vascepa for CV benefit but pt is uninsured so this is cost prohibitive, no grant available).

## 2021-01-09 ENCOUNTER — Telehealth: Payer: Self-pay

## 2021-01-09 ENCOUNTER — Other Ambulatory Visit: Payer: Self-pay

## 2021-01-09 NOTE — Telephone Encounter (Signed)
Left a message for pt to give office a call back for results.

## 2021-01-09 NOTE — Telephone Encounter (Signed)
-----   Message from Dorris Carnes V, MD sent at 01/09/2021 12:47 AM EDT ----- LDL is excellent at 3   She can pull back on Crestor to 20 mg  COntinue repatha Triglycerides are high  219   Watch/limit carbs

## 2021-01-13 ENCOUNTER — Telehealth: Payer: Self-pay

## 2021-01-13 ENCOUNTER — Other Ambulatory Visit: Payer: Self-pay

## 2021-01-13 MED ORDER — ROSUVASTATIN CALCIUM 20 MG PO TABS
20.0000 mg | ORAL_TABLET | Freq: Every day | ORAL | 3 refills | Status: DC
  Filled 2021-01-13: qty 30, 30d supply, fill #0
  Filled 2021-02-09: qty 30, 30d supply, fill #1
  Filled 2021-03-08: qty 30, 30d supply, fill #2
  Filled 2021-04-05: qty 30, 30d supply, fill #3
  Filled 2021-05-06: qty 30, 30d supply, fill #4
  Filled 2021-06-11 – 2021-06-12 (×2): qty 30, 30d supply, fill #0
  Filled 2021-07-15: qty 30, 30d supply, fill #1
  Filled 2021-08-18: qty 30, 30d supply, fill #2
  Filled 2021-09-14: qty 30, 30d supply, fill #3
  Filled 2021-10-18 – 2021-10-27 (×2): qty 30, 30d supply, fill #4
  Filled 2021-11-22 – 2021-11-23 (×2): qty 30, 30d supply, fill #5
  Filled 2021-12-23: qty 30, 30d supply, fill #6

## 2021-01-13 NOTE — Telephone Encounter (Signed)
Pt notified and verbalized understanding. Pt had no questions or concerns at this time.  

## 2021-01-13 NOTE — Telephone Encounter (Signed)
-----   Message from Dorris Carnes V, MD sent at 01/09/2021 12:47 AM EDT ----- LDL is excellent at 3   She can pull back on Crestor to 20 mg  COntinue repatha Triglycerides are high  219   Watch/limit carbs

## 2021-01-19 ENCOUNTER — Other Ambulatory Visit: Payer: Self-pay

## 2021-01-19 IMAGING — MG DIGITAL SCREENING BILAT W/ TOMO W/ CAD
8 series · 9 of 24 positions shown · non-contrast
Comparison: Previous exam(s).

CLINICAL DATA: Screening.

EXAM:
DIGITAL SCREENING BILATERAL MAMMOGRAM WITH TOMO AND CAD

[R MLO synth-2D]
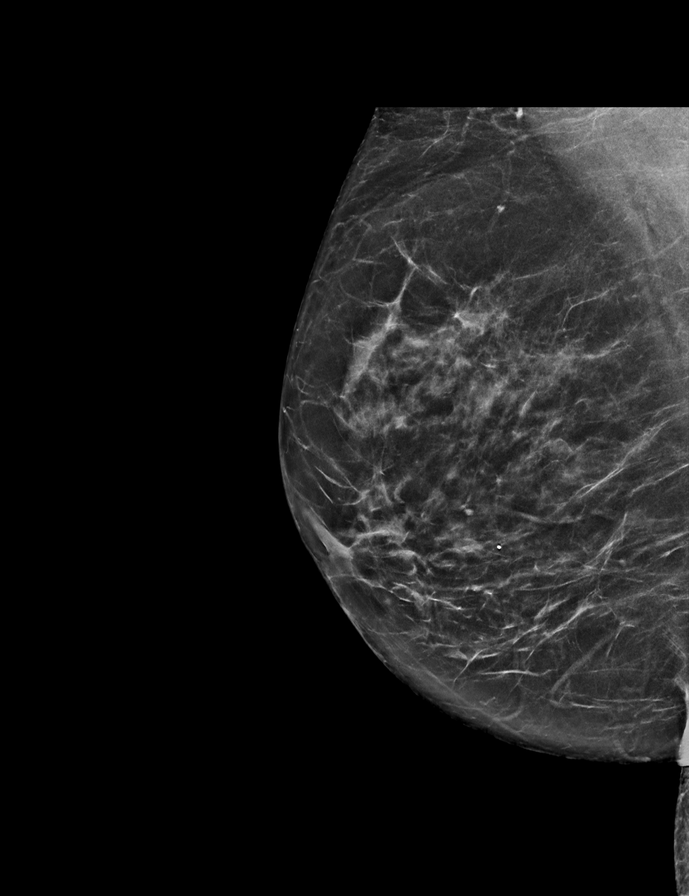

[L MLO synth-2D]
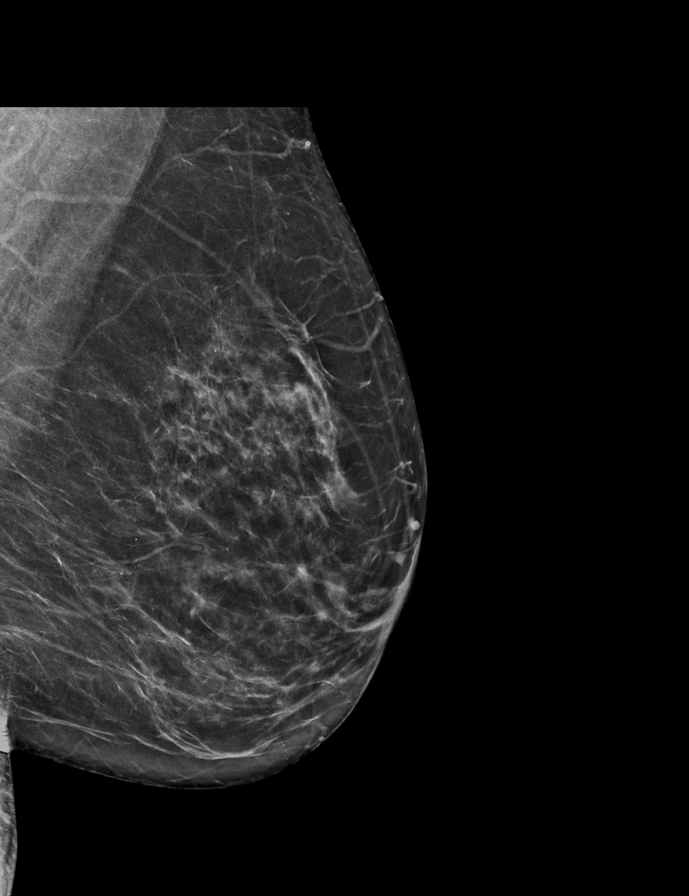

[L CC synth-2D]
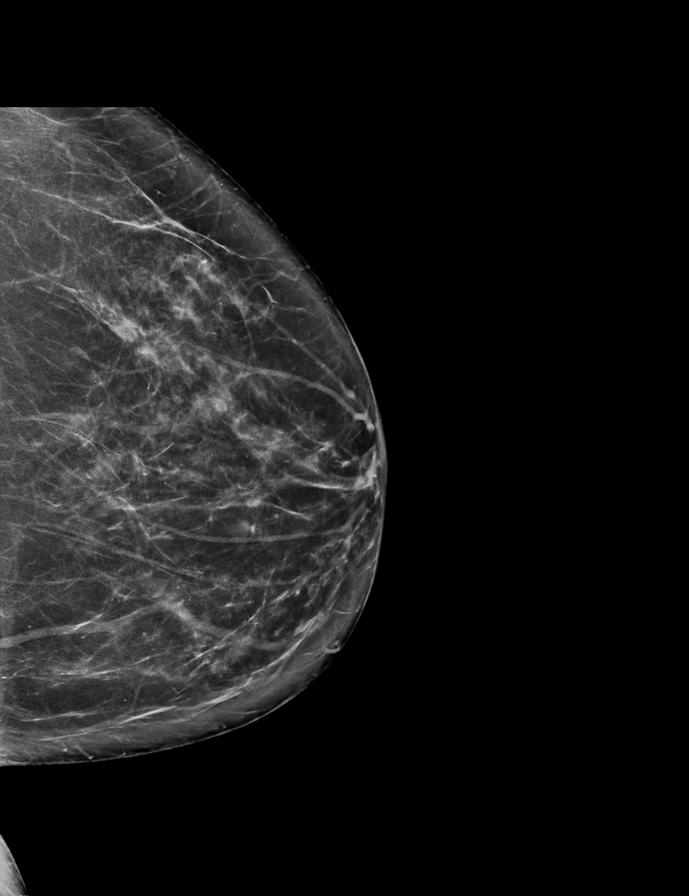

[R CC synth-2D]
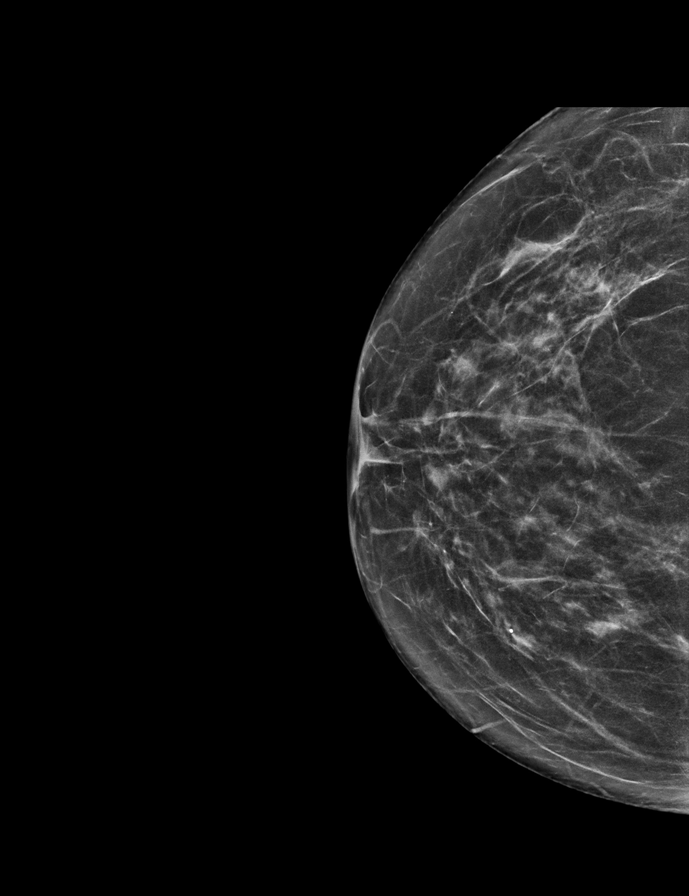

[L MLO tomo · 2 of 66 frames shown]
[frame 22/66]
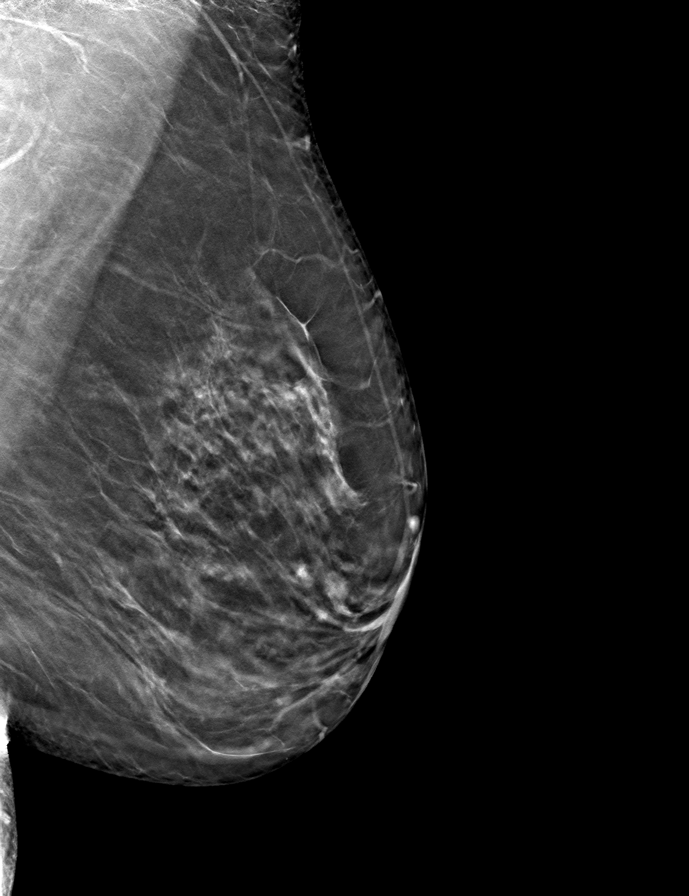
[frame 33/66]
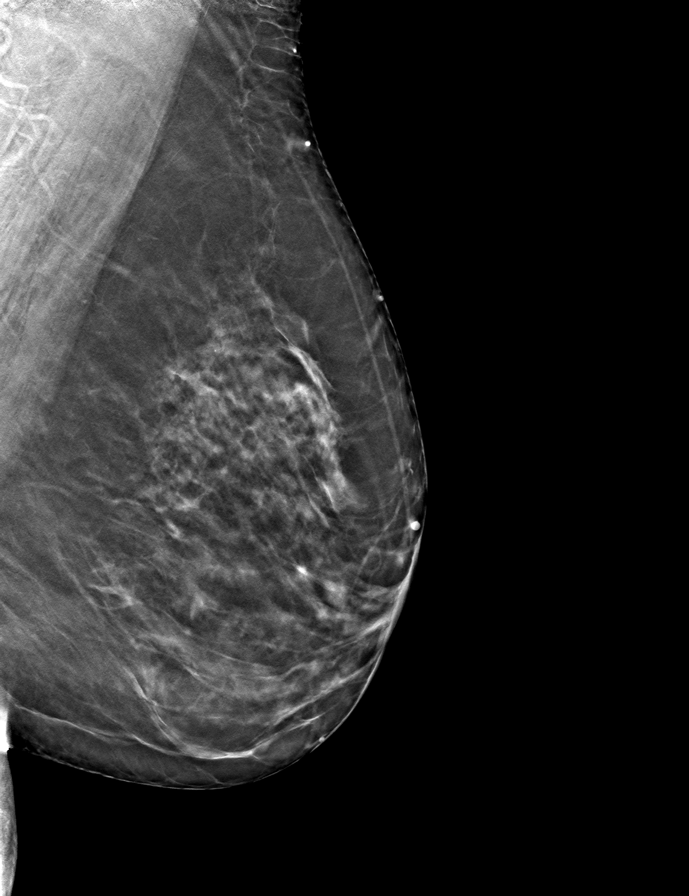

[R CC tomo · tomo slice 31/62.0]
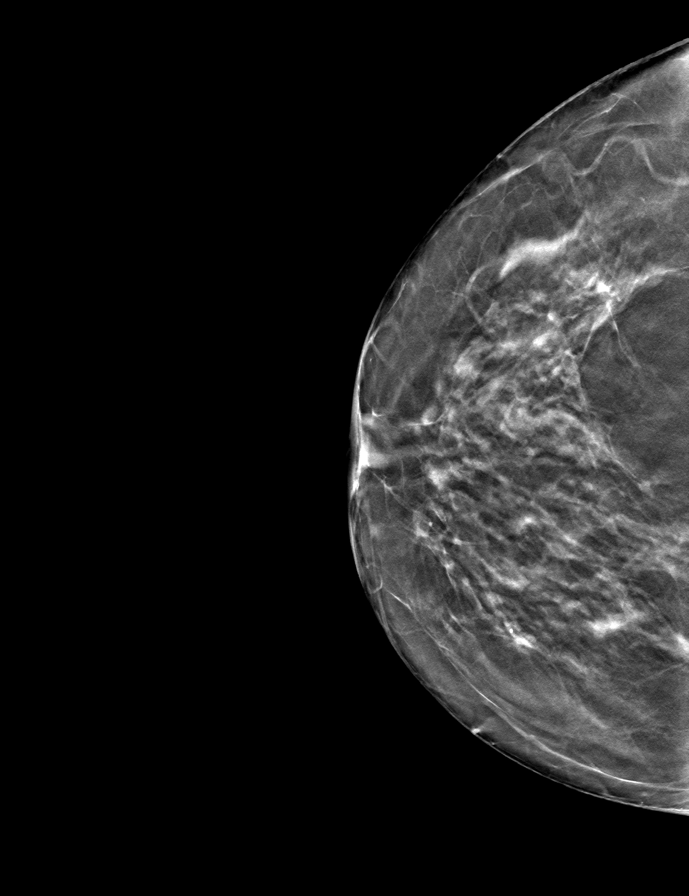

[L CC tomo · tomo slice 35/70.0]
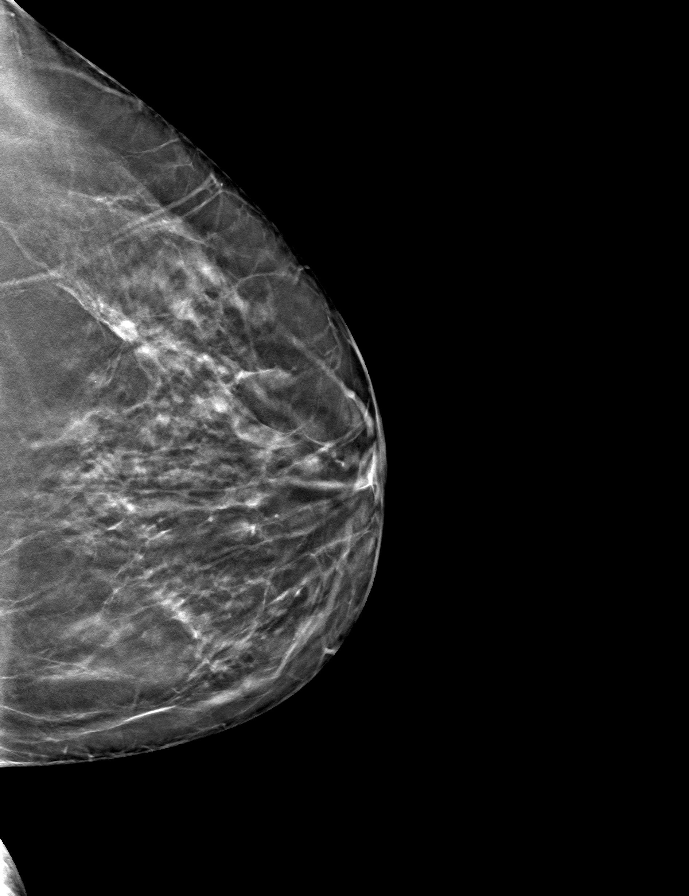

[R MLO tomo · tomo slice 34/67.0]
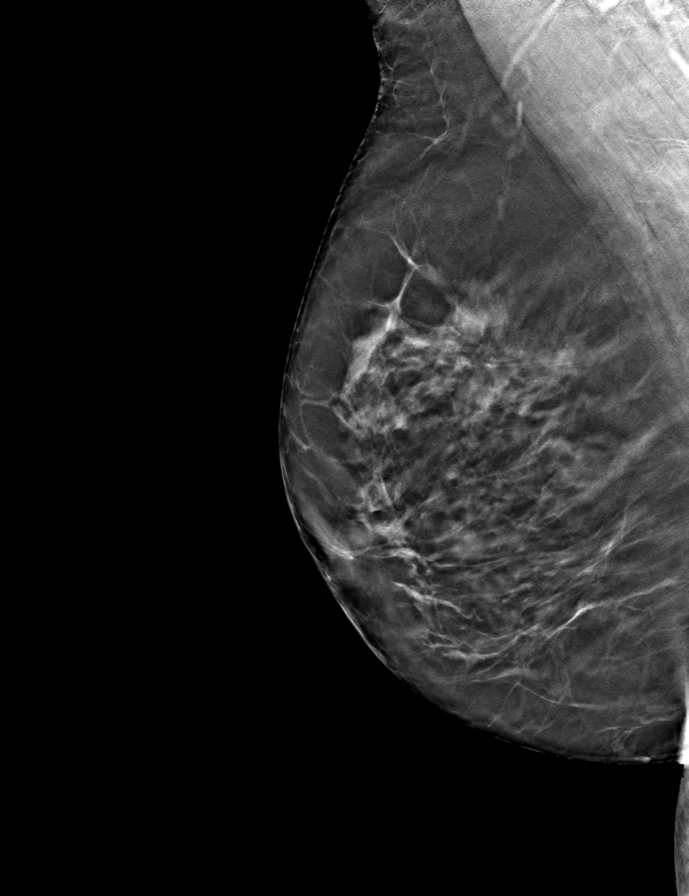

[9 of 24 positions shown; findings below may reference images not displayed]

ACR Breast Density Category b: There are scattered areas of
fibroglandular density.
FINDINGS: There are no findings suspicious for malignancy. Images were
processed with CAD.
IMPRESSION: No mammographic evidence of malignancy. A result letter of this
screening mammogram will be mailed directly to the patient.

RECOMMENDATION:
Screening mammogram in one year. (Code:[id number])

BI-RADS CATEGORY  1: Negative.

## 2021-02-09 ENCOUNTER — Other Ambulatory Visit: Payer: Self-pay

## 2021-02-09 ENCOUNTER — Other Ambulatory Visit: Payer: Self-pay | Admitting: Internal Medicine

## 2021-02-09 MED ORDER — LOSARTAN POTASSIUM 100 MG PO TABS
ORAL_TABLET | Freq: Every day | ORAL | 0 refills | Status: DC
  Filled 2021-02-09: qty 15, 15d supply, fill #0

## 2021-02-10 ENCOUNTER — Other Ambulatory Visit: Payer: Self-pay

## 2021-02-11 ENCOUNTER — Other Ambulatory Visit: Payer: Self-pay

## 2021-02-11 ENCOUNTER — Other Ambulatory Visit: Payer: Self-pay | Admitting: Internal Medicine

## 2021-02-11 DIAGNOSIS — R059 Cough, unspecified: Secondary | ICD-10-CM

## 2021-02-11 DIAGNOSIS — Z09 Encounter for follow-up examination after completed treatment for conditions other than malignant neoplasm: Secondary | ICD-10-CM

## 2021-02-11 MED ORDER — ALBUTEROL SULFATE HFA 108 (90 BASE) MCG/ACT IN AERS
2.0000 | INHALATION_SPRAY | RESPIRATORY_TRACT | 0 refills | Status: DC | PRN
  Filled 2021-02-11: qty 18, 25d supply, fill #0

## 2021-02-23 ENCOUNTER — Other Ambulatory Visit: Payer: Self-pay | Admitting: Internal Medicine

## 2021-02-23 ENCOUNTER — Other Ambulatory Visit: Payer: Self-pay

## 2021-02-23 MED ORDER — LOSARTAN POTASSIUM 100 MG PO TABS
ORAL_TABLET | Freq: Every day | ORAL | 0 refills | Status: DC
  Filled 2021-02-23: qty 15, 15d supply, fill #0

## 2021-02-26 ENCOUNTER — Other Ambulatory Visit: Payer: Self-pay

## 2021-03-04 ENCOUNTER — Ambulatory Visit: Payer: Self-pay

## 2021-03-06 ENCOUNTER — Other Ambulatory Visit: Payer: Self-pay

## 2021-03-06 ENCOUNTER — Ambulatory Visit: Payer: Self-pay | Attending: Internal Medicine | Admitting: Internal Medicine

## 2021-03-06 ENCOUNTER — Encounter: Payer: Self-pay | Admitting: Internal Medicine

## 2021-03-06 VITALS — BP 138/90 | HR 92 | Resp 16 | Wt 154.6 lb

## 2021-03-06 DIAGNOSIS — F1010 Alcohol abuse, uncomplicated: Secondary | ICD-10-CM

## 2021-03-06 DIAGNOSIS — I10 Essential (primary) hypertension: Secondary | ICD-10-CM

## 2021-03-06 DIAGNOSIS — Z6379 Other stressful life events affecting family and household: Secondary | ICD-10-CM

## 2021-03-06 DIAGNOSIS — R7303 Prediabetes: Secondary | ICD-10-CM

## 2021-03-06 DIAGNOSIS — E663 Overweight: Secondary | ICD-10-CM

## 2021-03-06 DIAGNOSIS — I2510 Atherosclerotic heart disease of native coronary artery without angina pectoris: Secondary | ICD-10-CM

## 2021-03-06 DIAGNOSIS — F3342 Major depressive disorder, recurrent, in full remission: Secondary | ICD-10-CM

## 2021-03-06 DIAGNOSIS — Z23 Encounter for immunization: Secondary | ICD-10-CM

## 2021-03-06 MED ORDER — METOPROLOL SUCCINATE ER 50 MG PO TB24
75.0000 mg | ORAL_TABLET | Freq: Every day | ORAL | 6 refills | Status: DC
Start: 2021-03-06 — End: 2021-04-06
  Filled 2021-03-06: qty 45, 30d supply, fill #0
  Filled 2021-03-08: qty 45, 30d supply, fill #1

## 2021-03-06 NOTE — Patient Instructions (Signed)
Your blood pressure is not at goal.  We have increased the metoprolol to 50 mg 1-1/2 tablets daily. Please cut back on the amount of alcoholic beverages that she drink at nights.  Prediabetes Eating Plan Prediabetes is a condition that causes blood sugar (glucose) levels to be higher than normal. This increases the risk for developing type 2 diabetes (type 2 diabetes mellitus). Working with a health care provider or nutrition specialist (dietitian) to make diet and lifestyle changes can help prevent the onset of diabetes. These changes may help you: Control your blood glucose levels. Improve your cholesterol levels. Manage your blood pressure. What are tips for following this plan? Reading food labels Read food labels to check the amount of fat, salt (sodium), and sugar in prepackaged foods. Avoid foods that have: Saturated fats. Trans fats. Added sugars. Avoid foods that have more than 300 milligrams (mg) of sodium per serving. Limit your sodium intake to less than 2,300 mg each day. Shopping Avoid buying pre-made and processed foods. Avoid buying drinks with added sugar. Cooking Cook with olive oil. Do not use butter, lard, or ghee. Bake, broil, grill, steam, or boil foods. Avoid frying. Meal planning  Work with your dietitian to create an eating plan that is right for you. This may include tracking how many calories you take in each day. Use a food diary, notebook, or mobile application to track what you eat at each meal. Consider following a Mediterranean diet. This includes: Eating several servings of fresh fruits and vegetables each day. Eating fish at least twice a week. Eating one serving each day of whole grains, beans, nuts, and seeds. Using olive oil instead of other fats. Limiting alcohol. Limiting red meat. Using nonfat or low-fat dairy products. Consider following a plant-based diet. This includes dietary choices that focus on eating mostly vegetables and fruit, grains,  beans, nuts, and seeds. If you have high blood pressure, you may need to limit your sodium intake or follow a diet such as the DASH (Dietary Approaches to Stop Hypertension) eating plan. The DASH diet aims to lower high blood pressure. Lifestyle Set weight loss goals with help from your health care team. It is recommended that most people with prediabetes lose 7% of their body weight. Exercise for at least 30 minutes 5 or more days a week. Attend a support group or seek support from a mental health counselor. Take over-the-counter and prescription medicines only as told by your health care provider. What foods are recommended? Fruits Berries. Bananas. Apples. Oranges. Grapes. Papaya. Mango. Pomegranate. Kiwi. Grapefruit. Cherries. Vegetables Lettuce. Spinach. Peas. Beets. Cauliflower. Cabbage. Broccoli. Carrots. Tomatoes. Squash. Eggplant. Herbs. Peppers. Onions. Cucumbers. Brussels sprouts. Grains Whole grains, such as whole-wheat or whole-grain breads, crackers, cereals, and pasta. Unsweetened oatmeal. Bulgur. Barley. Quinoa. Brown rice. Corn or whole-wheat flour tortillas or taco shells. Meats and other proteins Seafood. Poultry without skin. Lean cuts of pork and beef. Tofu. Eggs. Nuts. Beans. Dairy Low-fat or fat-free dairy products, such as yogurt, cottage cheese, and cheese. Beverages Water. Tea. Coffee. Sugar-free or diet soda. Seltzer water. Low-fat or nonfat milk. Milk alternatives, such as soy or almond milk. Fats and oils Olive oil. Canola oil. Sunflower oil. Grapeseed oil. Avocado. Walnuts. Sweets and desserts Sugar-free or low-fat pudding. Sugar-free or low-fat ice cream and other frozen treats. Seasonings and condiments Herbs. Sodium-free spices. Mustard. Relish. Low-salt, low-sugar ketchup. Low-salt, low-sugar barbecue sauce. Low-fat or fat-free mayonnaise. The items listed above may not be a complete list of recommended foods and  beverages. Contact a dietitian for more  information. What foods are not recommended? Fruits Fruits canned with syrup. Vegetables Canned vegetables. Frozen vegetables with butter or cream sauce. Grains Refined white flour and flour products, such as bread, pasta, snack foods, and cereals. Meats and other proteins Fatty cuts of meat. Poultry with skin. Breaded or fried meat. Processed meats. Dairy Full-fat yogurt, cheese, or milk. Beverages Sweetened drinks, such as iced tea and soda. Fats and oils Butter. Lard. Ghee. Sweets and desserts Baked goods, such as cake, cupcakes, pastries, cookies, and cheesecake. Seasonings and condiments Spice mixes with added salt. Ketchup. Barbecue sauce. Mayonnaise. The items listed above may not be a complete list of foods and beverages that are not recommended. Contact a dietitian for more information. Where to find more information American Diabetes Association: www.diabetes.org Summary You may need to make diet and lifestyle changes to help prevent the onset of diabetes. These changes can help you control blood sugar, improve cholesterol levels, and manage blood pressure. Set weight loss goals with help from your health care team. It is recommended that most people with prediabetes lose 7% of their body weight. Consider following a Mediterranean diet. This includes eating plenty of fresh fruits and vegetables, whole grains, beans, nuts, seeds, fish, and low-fat dairy, and using olive oil instead of other fats. This information is not intended to replace advice given to you by your health care provider. Make sure you discuss any questions you have with your health care provider. Document Revised: 08/16/2019 Document Reviewed: 08/16/2019 Elsevier Patient Education  [place].

## 2021-03-06 NOTE — Progress Notes (Signed)
Patient ID: Monique Hunt, female    DOB: 1960-09-21  MRN: [id number]  CC: chronic ds management  Subjective: Monique Hunt is a 60 y.o. female who presents for chronic ds management Her concerns today include:  Patient with history of HTN, CAD one stent to LAD 11/2017, depression, HL, LT thyroid nodule (no further f/u needed per endocrinology Dr. Kelton Pillar), RT lung nodule (stable in size on CT 07/2020)  HYPERTENSION/CAD/HL Currently taking: see medication list.  She reports compliance with amlodipine, aspirin, isosorbide, Cozaar, metoprolol and rosuvastatin. Med Adherence: [x]  Yes and took blood pressure medications already for the a.m    []  No Medication side effects: []  Yes    [x]  No Adherence with salt restriction: []  Yes    [x]  No Home Monitoring?: [x]  Yes    []  No Monitoring Frequency: not often.  Last checked 1 mth.  It was good Home BP results range:  SOB? []  Yes    [x]  No Chest Pain?: []  Yes    [x]  No Leg swelling?: []  Yes    [x]  No Headaches?: []  Yes    [x]  No Dizziness? []  Yes    [x]  No Comments:   Overwgh/PreDM:  new dx of preDM done on lab test.  Referred to nutritionist and was called but no insurance.  Reports she tends to over eat. She has cut back on diet sodas.  Admits she has not cut back on white carbs.  Under a lot of stress.  Feels she is a stress eater.  Drinking more alcoholic beverages than she use to.  Drinks 2-3 mix drinks at nights about 5x/wk  "Effexor really helps me a lot.  It keeps me calm on my job and dealing with my mom." Mom resides at an ALF but pt visits with her daily.  Feels depression in remission on Effexor but feels she needs help dealing with stress  HM:  had flu shot at work 2 days ago.  Due for shingles vaccine Patient Active Problem List   Diagnosis Date Noted   Prediabetes 11/05/2020   Coronary artery disease involving native coronary artery of native heart with angina pectoris ([place]) 08/06/2020   Low TSH level 08/04/2018    Chronic diarrhea 06/05/2018   Left thyroid nodule 04/07/2018   Lung nodule, solitary 04/07/2018   CAD (coronary artery disease), native coronary artery 12/19/2017   Hyperlipidemia LDL goal <70    NSTEMI (non-ST elevated myocardial infarction) ([place]) 12/15/2017   Hypertension 12/01/2017   Depression 06/04/2012   Nephrolithiasis 09/27/2011     Current Outpatient Medications on File Prior to Visit  Medication Sig Dispense Refill   albuterol (VENTOLIN HFA) 108 (90 Base) MCG/ACT inhaler INHALE 2 PUFFS INTO THE LUNGS EVERY 4 (FOUR) HOURS AS NEEDED FOR WHEEZING OR SHORTNESS OF BREATH. 18 g 0   amLODipine (NORVASC) 10 MG tablet Take 1 tablet (10 mg total) by mouth daily. 30 tablet 2   aspirin 81 MG chewable tablet Chew 1 tablet (81 mg total) by mouth daily. 30 tablet 11   dicyclomine (BENTYL) 10 MG capsule TAKE 2 CAPSULES (20 MG TOTAL) BY MOUTH EVERY 8 (EIGHT) HOURS AS NEEDED FOR SPASMS. FOR ABDOMINAL CRAMPS AND DIARRHEA 45 capsule 1   esomeprazole (NEXIUM) 20 MG capsule Take 20 mg by mouth daily at 12 noon.     Evolocumab (REPATHA SURECLICK) 284 MG/ML SOAJ Inject 1 pen into the skin every 14 (fourteen) days.     isosorbide mononitrate (IMDUR) 30 MG 24 hr tablet Take 1  tablet (30 mg total) by mouth daily. 30 tablet 4   losartan (COZAAR) 100 MG tablet TAKE 1 TABLET BY MOUTH DAILY. 15 tablet 0   metoprolol succinate (TOPROL-XL) 50 MG 24 hr tablet TAKE 1 TABLET (50 MG TOTAL) BY MOUTH DAILY. 30 tablet 6   nitroGLYCERIN (NITROSTAT) 0.4 MG SL tablet Place 1 tablet (0.4 mg total) under the tongue every 5 (five) minutes x 3 doses as needed for chest pain. 30 tablet 3   rosuvastatin (CRESTOR) 20 MG tablet Take 1 tablet (20 mg total) by mouth daily. 90 tablet 3   venlafaxine XR (EFFEXOR-XR) 75 MG 24 hr capsule Take 1 capsule (75 mg total) by mouth daily with breakfast. 30 capsule 2   No current facility-administered medications on file prior to visit.    No Known Allergies  Social History    Socioeconomic History   Marital status: Divorced    Spouse name: Not on file   Number of children: 2   Years of education: Not on file   Highest education level: Not on file  Occupational History   Not on file  Tobacco Use   Smoking status: Never   Smokeless tobacco: Never  Vaping Use   Vaping Use: Never used  Substance and Sexual Activity   Alcohol use: Yes    Comment: 3 TIMES A WEEK   Drug use: Never   Sexual activity: Yes    Comment: menopausal  Other Topics Concern   Not on file  Social History Narrative   Not on file   Social Determinants of Health   Financial Resource Strain: Not on file  Food Insecurity: Not on file  Transportation Needs: Not on file  Physical Activity: Not on file  Stress: Not on file  Social Connections: Not on file  Intimate Partner Violence: Not on file    Family History  Problem Relation Age of Onset   CAD Father    Heart disease Father    Breast cancer Mother    Hyperlipidemia Sister    Hyperlipidemia Brother    Colon cancer Neg Hx    Esophageal cancer Neg Hx    Rectal cancer Neg Hx    Stomach cancer Neg Hx     Past Surgical History:  Procedure Laterality Date   CORONARY STENT INTERVENTION N/A 12/19/2017   Procedure: CORONARY STENT INTERVENTION;  Surgeon: Belva Crome, MD;  Location: [facility];  Service: Cardiovascular;  Laterality: N/A;   CYSTOSCOPY W/ STONE MANIPULATION     FACIAL FRACTURE SURGERY  1979   LEFT HEART CATH AND CORONARY ANGIOGRAPHY N/A 12/16/2017   Procedure: LEFT HEART CATH AND CORONARY ANGIOGRAPHY;  Surgeon: Belva Crome, MD;  Location: [facility];  Service: Cardiovascular;  Laterality: N/A;   LEFT HEART CATH AND CORONARY ANGIOGRAPHY N/A 12/19/2019   Procedure: LEFT HEART CATH AND CORONARY ANGIOGRAPHY;  Surgeon: Lorretta Harp, MD;  Location: [facility];  Service: Cardiovascular;  Laterality: N/A;   LITHOTRIPSY     SKIN SURGERY Right    "birthmark removed under my arm when I  was little"    ROS: Review of Systems Negative except as stated above  PHYSICAL EXAM: BP (!) 145/87   Pulse 98   Resp 16   Wt 154 lb 9.6 oz (70.1 kg)   LMP 02/28/2011   SpO2 98%   BMI 25.73 kg/m   Wt Readings from Last 3 Encounters:  03/06/21 154 lb 9.6 oz (70.1 kg)  11/04/20 150  lb 6.4 oz (68.2 kg)  08/18/20 150 lb (68 kg)    Physical Exam  General appearance - alert, well appearing, middle-age Caucasian female and in no distress Mental status - normal mood, behavior, speech, dress, motor activity, and thought processes Neck - supple, no significant adenopathy Chest - clear to auscultation, no wheezes, rales or rhonchi, symmetric air entry Heart - normal rate, regular rhythm, normal S1, S2, no murmurs, rubs, clicks or gallops Extremities - peripheral pulses normal, no pedal edema, no clubbing or cyanosis   Depression screen [facility] 2/9 03/06/2021 11/04/2020 06/05/2018  Decreased Interest 0 0 0  Down, Depressed, Hopeless 0 0 0  PHQ - 2 Score 0 0 0  Some recent data might be hidden    CMP Latest Ref Rng & Units 11/04/2020 09/22/2020 08/18/2020  Glucose 65 - 99 mg/dL 92 - 130(H)  BUN 8 - 27 mg/dL 25 - 29(H)  Creatinine 0.57 - 1.00 mg/dL 0.79 - 1.42(H)  Sodium 134 - 144 mmol/L 138 - 134(L)  Potassium 3.5 - 5.2 mmol/L 4.3 - 4.1  Chloride 96 - 106 mmol/L 101 - 99  CO2 20 - 29 mmol/L 22 - 22  Calcium 8.7 - 10.3 mg/dL 10.0 - 10.0  Total Protein 6.0 - 8.5 g/dL - 7.2 -  Total Bilirubin 0.0 - 1.2 mg/dL - 0.2 -  Alkaline Phos 44 - 121 IU/L - 110 -  AST 0 - 40 IU/L - 20 -  ALT 0 - 32 IU/L - 23 -   Lipid Panel     Component Value Date/Time   CHOL 90 (L) 01/06/2021 0901   TRIG 219 (H) 01/06/2021 0901   HDL 55 01/06/2021 0901   CHOLHDL 1.6 01/06/2021 0901   CHOLHDL 7.2 12/16/2017 0714   VLDL 79 (H) 12/16/2017 0714   LDLCALC 3 01/06/2021 0901    CBC    Component Value Date/Time   WBC 7.7 08/18/2020 1703   RBC 4.85 08/18/2020 1703   HGB 14.7 08/18/2020 1703   HGB 12.7  12/18/2019 0900   HCT 44.5 08/18/2020 1703   HCT 38.6 12/18/2019 0900   PLT 470 (H) 08/18/2020 1703   PLT 451 (H) 12/18/2019 0900   MCV 91.8 08/18/2020 1703   MCV 94 12/18/2019 0900   MCH 30.3 08/18/2020 1703   MCHC 33.0 08/18/2020 1703   RDW 13.7 08/18/2020 1703   RDW 13.1 12/18/2019 0900   LYMPHSABS 1.3 05/10/2018 1218   MONOABS 0.6 02/10/2018 1741   EOSABS 0.1 05/10/2018 1218   BASOSABS 0.1 05/10/2018 [facility] Visit from 03/06/2021 in [place]  AUDIT-C Score 5         ASSESSMENT AND PLAN: 1. Essential hypertension Not at goal.  I recommend increasing metoprolol to 75 mg daily.  Continue other medications.  Advised to check blood pressure at least once a week with goal being 130/80 or lower. - metoprolol succinate (TOPROL-XL) 50 MG 24 hr tablet; Take 1.5 tablets (75 mg total) by mouth daily.  Dispense: 45 tablet; Refill: 6  2. Coronary artery disease involving native coronary artery of native heart without angina pectoris Stable. Continue aspirin, metoprolol and rosuvastatin - metoprolol succinate (TOPROL-XL) 50 MG 24 hr tablet; Take 1.5 tablets (75 mg total) by mouth daily.  Dispense: 45 tablet; Refill: 6  3. Overweight (BMI 25.0-29.9) 4. Prediabetes -Discussed and encourage healthy eating habits.  Encouraged her to try to cut back on portion sizes of white carbohydrates, eliminate sugary  drinks from the diet including excess alcohol, incorporate fresh fruits and vegetables into the diet.  Try to encourage other meaningful ways of managing stress.  Printed information given on healthy eating habits.  5. Stressful life event affecting family Patient agreeable to seeing a counselor to be taught coping skills. - Ambulatory referral to Psychiatry  6. Alcohol use disorder, mild, abuse Encouraged her to cut back on the amount that she is currently drinking.  Should have no more than 1 standard drink per day.  7. Recurrent  major depressive disorder, in full remission (HCC) Continue Effexor  8. Need for shingles vaccine First shot given.  Patient advised that she can have some redness and swelling at the injection site that can last 3 to 5 days.  We will plan to give the second shot on her subsequent visit in 4 months.    Patient was given the opportunity to ask questions.  Patient verbalized understanding of the plan and was able to repeat key elements of the plan.   No orders of the defined types were placed in this encounter.    Requested Prescriptions    No prescriptions requested or ordered in this encounter    No follow-ups on file.  Karle Plumber, MD, FACP

## 2021-03-08 ENCOUNTER — Other Ambulatory Visit: Payer: Self-pay | Admitting: Internal Medicine

## 2021-03-09 ENCOUNTER — Other Ambulatory Visit: Payer: Self-pay

## 2021-03-10 ENCOUNTER — Other Ambulatory Visit: Payer: Self-pay

## 2021-03-10 MED ORDER — LOSARTAN POTASSIUM 100 MG PO TABS
100.0000 mg | ORAL_TABLET | Freq: Every day | ORAL | 0 refills | Status: DC
  Filled 2021-03-10: qty 30, 30d supply, fill #0
  Filled 2021-04-05: qty 30, 30d supply, fill #1
  Filled 2021-05-06: qty 30, 30d supply, fill #2

## 2021-04-02 ENCOUNTER — Ambulatory Visit: Payer: Self-pay | Admitting: *Deleted

## 2021-04-02 NOTE — Telephone Encounter (Signed)
Reason for Disposition  [1] Taking BP medications AND [2] feels is having side effects (e.g., impotence, cough, dizzy upon standing)  Answer Assessment - Initial Assessment Questions 1. DESCRIPTION: "Describe your dizziness."     lightheaded 2. LIGHTHEADED: "Do you feel lightheaded?" (e.g., somewhat faint, woozy, weak upon standing)     Dizziness when standing but goes away after a few seconds 3. VERTIGO: "Do you feel like either you or the room is spinning or tilting?" (i.e. vertigo)     na 4. SEVERITY: "How bad is it?"  "Do you feel like you are going to faint?" "Can you stand and walk?"   - MILD: Feels slightly dizzy, but walking normally.   - MODERATE: Feels unsteady when walking, but not falling; interferes with normal activities (e.g., school, work).   - SEVERE: Unable to walk without falling, or requires assistance to walk without falling; feels like passing out now.      Mild  5. ONSET:  "When did the dizziness begin?"     4 days ago  6. AGGRAVATING FACTORS: "Does anything make it worse?" (e.g., standing, change in head position)     na 7. HEART RATE: "Can you tell me your heart rate?" "How many beats in 15 seconds?"  (Note: not all patients can do this)       na 8. CAUSE: "What do you think is causing the dizziness?"     Not sure  9. RECURRENT SYMPTOM: "Have you had dizziness before?" If Yes, ask: "When was the last time?" "What happened that time?"     na 10. OTHER SYMPTOMS: "Do you have any other symptoms?" (e.g., fever, chest pain, vomiting, diarrhea, bleeding)       Nausea, dizziness, pain behind eyes  11. PREGNANCY: "Is there any chance you are pregnant?" "When was your last menstrual period?"       Na  Answer Assessment - Initial Assessment Questions 1. LOCATION: "Where does it hurt?"      Different areas , front of head, behind eyes, back or head  2. ONSET: "When did the headache start?" (Minutes, hours or days)      4 days  3. PATTERN: "Does the pain come and go,  or has it been constant since it started?"    Comes and goes  4. SEVERITY: "How bad is the pain?" and "What does it keep you from doing?"  (e.g., Scale 1-10; mild, moderate, or severe)   - MILD (1-3): doesn't interfere with normal activities    - MODERATE (4-7): interferes with normal activities or awakens from sleep    - SEVERE (8-10): excruciating pain, unable to do any normal activities        Significant pain at times feels nausea and wants to vomit 5. RECURRENT SYMPTOM: "Have you ever had headaches before?" If Yes, ask: "When was the last time?" and "What happened that time?"      na 6. CAUSE: "What do you think is causing the headache?"     Not sure  7. MIGRAINE: "Have you been diagnosed with migraine headaches?" If Yes, ask: "Is this headache similar?"      na 8. HEAD INJURY: "Has there been any recent injury to the head?"      na 9. OTHER SYMPTOMS: "Do you have any other symptoms?" (fever, stiff neck, eye pain, sore throat, cold symptoms)     Pain behind eyes dizziness , nausea  10. PREGNANCY: "Is there any chance you are pregnant?" "When was your last  menstrual period?"       na  Answer Assessment - Initial Assessment Questions 1. BLOOD PRESSURE: "What is the blood pressure?" "Did you take at least two measurements 5 minutes apart?"     Beginning of call bp 166/94 and then 5:16 pm bp 164/94 2. ONSET: "When did you take your blood pressure?"     Now  3. HOW: "How did you obtain the blood pressure?" (e.g., visiting nurse, automatic home BP monitor)     Automatic bp monitor  4. HISTORY: "Do you have a history of high blood pressure?"     Yes  5. MEDICATIONS: "Are you taking any medications for blood pressure?" "Have you missed any doses recently?"     Yes  have not missed any doses 6. OTHER SYMPTOMS: "Do you have any symptoms?" (e.g., headache, chest pain, blurred vision, difficulty breathing, weakness)     Headache, pain behind eyes, dizziness  7. PREGNANCY: "Is there any  chance you are pregnant?" "When was your last menstrual period?"     na  Protocols used: Dizziness - Lightheadedness-A-AH, Headache-A-AH, Blood Pressure - High-A-AH

## 2021-04-02 NOTE — Telephone Encounter (Signed)
C/o headaches and dizziness, pain behind eyes x 4 days . C/o nausea and feeling she could vomit but does not. Dizziness standing and goes away after a few seconds. Significant headache comes and goes and taking tylenol but does not keep headache away. Denies chest pain  difficulty breathing, fever, blurred vision, weakness on either side. Reports leaving work early today due to headache. Denies cold symptoms . Patient checked BP at beginning of call for 166/94 and again at 5:16 pm for 164/94. No available appt noted within 3 days. Please advise. Patient reports she is taking BP meds as directed. Recommended if headache returns and BP elevates go to UC or ED for evaluation . Care advise given. Patient verbalized understanding of  care advise and to to call back or to UC or ED if symptoms worsen.

## 2021-04-03 NOTE — Telephone Encounter (Signed)
Sent pt a MyChart message

## 2021-04-05 ENCOUNTER — Other Ambulatory Visit: Payer: Self-pay | Admitting: Internal Medicine

## 2021-04-05 DIAGNOSIS — F3342 Major depressive disorder, recurrent, in full remission: Secondary | ICD-10-CM

## 2021-04-05 DIAGNOSIS — I10 Essential (primary) hypertension: Secondary | ICD-10-CM

## 2021-04-05 MED ORDER — VENLAFAXINE HCL ER 75 MG PO CP24
75.0000 mg | ORAL_CAPSULE | Freq: Every day | ORAL | 2 refills | Status: DC
  Filled 2021-04-05: qty 30, 30d supply, fill #0
  Filled 2021-05-06: qty 30, 30d supply, fill #1
  Filled 2021-06-03: qty 30, 30d supply, fill #2

## 2021-04-05 MED ORDER — AMLODIPINE BESYLATE 10 MG PO TABS
10.0000 mg | ORAL_TABLET | Freq: Every day | ORAL | 2 refills | Status: DC
  Filled 2021-04-05: qty 30, 30d supply, fill #0
  Filled 2021-05-06: qty 30, 30d supply, fill #1
  Filled 2021-06-11 – 2021-06-12 (×2): qty 30, 30d supply, fill #0

## 2021-04-05 NOTE — Telephone Encounter (Signed)
Requested Prescriptions  Pending Prescriptions Disp Refills  . amLODipine (NORVASC) 10 MG tablet 30 tablet 2    Sig: Take 1 tablet (10 mg total) by mouth daily.     Cardiovascular:  Calcium Channel Blockers Failed - 04/05/2021  4:15 PM      Failed - Last BP in normal range    BP Readings from Last 1 Encounters:  03/06/21 138/90         Passed - Valid encounter within last 6 months    Recent Outpatient Visits          1 month ago Essential hypertension   [place], MD   5 months ago Essential hypertension   [place], MD   7 months ago Hospital discharge follow-up   [place], Zelda W, NP   1 year ago Need for influenza vaccination   [place], Jarome Matin, RPH-CPP   1 year ago Essential hypertension   [place], MD      Future Appointments            Tomorrow Ladell Pier, MD [place]   In 2 months Ladell Pier, MD [place]           . venlafaxine XR (EFFEXOR-XR) 75 MG 24 hr capsule 30 capsule 2    Sig: Take 1 capsule (75 mg total) by mouth daily with breakfast.     Psychiatry: Antidepressants - SNRI - desvenlafaxine & venlafaxine Failed - 04/05/2021  4:15 PM      Failed - Total Cholesterol in normal range and within 360 days    Cholesterol, Total  Date Value Ref Range Status  01/06/2021 90 (L) 100 - 199 mg/dL Final         Failed - Triglycerides in normal range and within 360 days    Triglycerides  Date Value Ref Range Status  01/06/2021 219 (H) 0 - 149 mg/dL Final         Failed - Last BP in normal range    BP Readings from Last 1 Encounters:  03/06/21 138/90         Passed - LDL in normal range and within 360 days    LDL Chol Calc (NIH)  Date Value  Ref Range Status  01/06/2021 3 0 - 99 mg/dL Final         Passed - Completed PHQ-2 or PHQ-9 in the last 360 days      Passed - Valid encounter within last 6 months    Recent Outpatient Visits          1 month ago Essential hypertension   [place], Deborah B, MD   5 months ago Essential hypertension   [place], MD   7 months ago Hospital discharge follow-up   [place], Zelda W, NP   1 year ago Need for influenza vaccination   [place], Jarome Matin, RPH-CPP   1 year ago Essential hypertension   [place], MD      Future Appointments            Tomorrow Wynetta Emery,  Dalbert Batman, MD [place]   In 2 months Wynetta Emery Dalbert Batman, MD [place]

## 2021-04-06 ENCOUNTER — Other Ambulatory Visit: Payer: Self-pay

## 2021-04-06 ENCOUNTER — Ambulatory Visit: Payer: Self-pay | Attending: Internal Medicine | Admitting: Internal Medicine

## 2021-04-06 ENCOUNTER — Other Ambulatory Visit: Payer: Self-pay | Admitting: Gastroenterology

## 2021-04-06 ENCOUNTER — Encounter: Payer: Self-pay | Admitting: Internal Medicine

## 2021-04-06 VITALS — BP 151/91 | HR 103 | Resp 16 | Wt 154.2 lb

## 2021-04-06 DIAGNOSIS — I10 Essential (primary) hypertension: Secondary | ICD-10-CM

## 2021-04-06 DIAGNOSIS — G43009 Migraine without aura, not intractable, without status migrainosus: Secondary | ICD-10-CM

## 2021-04-06 MED ORDER — CARVEDILOL 25 MG PO TABS
25.0000 mg | ORAL_TABLET | Freq: Two times a day (BID) | ORAL | 3 refills | Status: DC
  Filled 2021-04-06: qty 60, 30d supply, fill #0
  Filled 2021-05-06: qty 60, 30d supply, fill #1
  Filled 2021-06-11 – 2021-06-12 (×2): qty 60, 30d supply, fill #0
  Filled 2021-07-15: qty 60, 30d supply, fill #1

## 2021-04-06 MED ORDER — TOPIRAMATE 25 MG PO TABS
25.0000 mg | ORAL_TABLET | Freq: Every day | ORAL | 1 refills | Status: DC
Start: 2021-04-06 — End: 2021-06-03
  Filled 2021-04-06: qty 30, 30d supply, fill #0
  Filled 2021-05-06: qty 30, 30d supply, fill #1

## 2021-04-06 MED ORDER — BUTALBITAL-APAP-CAFFEINE 50-325-40 MG PO TABS
1.0000 | ORAL_TABLET | Freq: Three times a day (TID) | ORAL | 1 refills | Status: DC | PRN
Start: 2021-04-06 — End: 2021-08-25
  Filled 2021-04-06: qty 20, 4d supply, fill #0
  Filled 2021-05-08: qty 20, 4d supply, fill #1

## 2021-04-06 NOTE — Patient Instructions (Signed)
Stop metoprolol. Changed to carvedilol 25 mg twice a day.  Start Fioricet and use as needed for the headache.  This medication can be habit-forming so use only when needed.  Start Topamax 25 mg at bedtime to help decrease the frequency of the headaches.  Please make sure that you are getting adequate sleep at nights at least 7 to 9 hours.  Once you have been approved for the orange card/cone discount card please let me know so that I can submit the referral to the neurologist and we can order the MRI of the brain.

## 2021-04-06 NOTE — Progress Notes (Signed)
Patient ID: Monique Hunt, female    DOB: 1960/09/21  MRN: [id number]  CC: Hospitalization Follow-up (Urgent Care )   Subjective: Monique Hunt is a 60 y.o. female who presents for UC visit.  F/u Headache Her concerns today include:  Patient with history of HTN, CAD one stent to LAD 11/2017, depression, HL, LT thyroid nodule (no further f/u needed per endocrinology Dr. Kelton Pillar), RT lung nodule (stable in size on CT 07/2020)   Presents today with complaints of headache. Started 1 wk ago behind the eyes then moved to back of head Intermittent lasting hrs. Assoc with N/V, photophobia.  Little dizziness with position change.  No blurred vision She has tried taking at various times Aleve, Extra Strength Tylenol, naprosyn.  Nothing helped.  Sometimes they made it worse. Years ago, she had migraines where she had to take Imitrex.  Has not had a a migraine in 25 yrs. Similar to prior migraines in that it started behind the eyes on both sides and had photophobia.  However previous migraines did not moved to the back of the head. No fever.  Pain in temples only first few days. Not sure of any contributing factors.  Had this past wkend off so does not feel that stress played an issue.  No headache today.  No headache yesterday morning but had 1 yesterday afternoon after being with her mother. BP this was a.m was 172/109, P 107 This past wkend 166/94 Taking BP meds consistently Was seen at urgent care 04/03/2021 for the headache.  She was advised to be seen in the emergency room but patient did not feel like sitting in the emergency room for hours.  She reports that she discontinued drinking completely after her last visit with me on the 10th of last month.  Patient Active Problem List   Diagnosis Date Noted   Prediabetes 11/05/2020   Coronary artery disease involving native coronary artery of native heart with angina pectoris ([facility]) 08/06/2020   Low TSH level 08/04/2018   Chronic  diarrhea 06/05/2018   Left thyroid nodule 04/07/2018   Lung nodule, solitary 04/07/2018   CAD (coronary artery disease), native coronary artery 12/19/2017   Hyperlipidemia LDL goal <70    NSTEMI (non-ST elevated myocardial infarction) ([place]) 12/15/2017   Hypertension 12/01/2017   Depression 06/04/2012   Nephrolithiasis 09/27/2011     Current Outpatient Medications on File Prior to Visit  Medication Sig Dispense Refill   albuterol (VENTOLIN HFA) 108 (90 Base) MCG/ACT inhaler INHALE 2 PUFFS INTO THE LUNGS EVERY 4 (FOUR) HOURS AS NEEDED FOR WHEEZING OR SHORTNESS OF BREATH. 18 g 0   amLODipine (NORVASC) 10 MG tablet Take 1 tablet (10 mg total) by mouth daily. 30 tablet 2   aspirin 81 MG chewable tablet Chew 1 tablet (81 mg total) by mouth daily. 30 tablet 11   dicyclomine (BENTYL) 10 MG capsule TAKE 2 CAPSULES (20 MG TOTAL) BY MOUTH EVERY 8 (EIGHT) HOURS AS NEEDED FOR SPASMS. FOR ABDOMINAL CRAMPS AND DIARRHEA 45 capsule 1   esomeprazole (NEXIUM) 20 MG capsule Take 20 mg by mouth daily at 12 noon.     Evolocumab (REPATHA SURECLICK) 948 MG/ML SOAJ Inject 1 pen into the skin every 14 (fourteen) days.     isosorbide mononitrate (IMDUR) 30 MG 24 hr tablet Take 1 tablet (30 mg total) by mouth daily. 30 tablet 4   losartan (COZAAR) 100 MG tablet Take 1 tablet (100 mg total) by mouth daily. Please make yearly appt with Dr.  Ross for December 2022 for future refills. Thank you 1st attempt 90 tablet 0   nitroGLYCERIN (NITROSTAT) 0.4 MG SL tablet Place 1 tablet (0.4 mg total) under the tongue every 5 (five) minutes x 3 doses as needed for chest pain. 30 tablet 3   rosuvastatin (CRESTOR) 20 MG tablet Take 1 tablet (20 mg total) by mouth daily. 90 tablet 3   venlafaxine XR (EFFEXOR-XR) 75 MG 24 hr capsule Take 1 capsule (75 mg total) by mouth daily with breakfast. 30 capsule 2   No current facility-administered medications on file prior to visit.    No Known Allergies  Social History   Socioeconomic  History   Marital status: Divorced    Spouse name: Not on file   Number of children: 2   Years of education: Not on file   Highest education level: Not on file  Occupational History   Not on file  Tobacco Use   Smoking status: Never   Smokeless tobacco: Never  Vaping Use   Vaping Use: Never used  Substance and Sexual Activity   Alcohol use: Yes    Comment: 3 TIMES A WEEK   Drug use: Never   Sexual activity: Yes    Comment: menopausal  Other Topics Concern   Not on file  Social History Narrative   Not on file   Social Determinants of Health   Financial Resource Strain: Not on file  Food Insecurity: Not on file  Transportation Needs: Not on file  Physical Activity: Not on file  Stress: Not on file  Social Connections: Not on file  Intimate Partner Violence: Not on file    Family History  Problem Relation Age of Onset   CAD Father    Heart disease Father    Breast cancer Mother    Hyperlipidemia Sister    Hyperlipidemia Brother    Colon cancer Neg Hx    Esophageal cancer Neg Hx    Rectal cancer Neg Hx    Stomach cancer Neg Hx     Past Surgical History:  Procedure Laterality Date   CORONARY STENT INTERVENTION N/A 12/19/2017   Procedure: CORONARY STENT INTERVENTION;  Surgeon: Belva Crome, MD;  Location: [facility];  Service: Cardiovascular;  Laterality: N/A;   CYSTOSCOPY W/ STONE MANIPULATION     FACIAL FRACTURE SURGERY  1979   LEFT HEART CATH AND CORONARY ANGIOGRAPHY N/A 12/16/2017   Procedure: LEFT HEART CATH AND CORONARY ANGIOGRAPHY;  Surgeon: Belva Crome, MD;  Location: [facility];  Service: Cardiovascular;  Laterality: N/A;   LEFT HEART CATH AND CORONARY ANGIOGRAPHY N/A 12/19/2019   Procedure: LEFT HEART CATH AND CORONARY ANGIOGRAPHY;  Surgeon: Lorretta Harp, MD;  Location: [facility];  Service: Cardiovascular;  Laterality: N/A;   LITHOTRIPSY     SKIN SURGERY Right    "birthmark removed under my arm when I was little"     ROS: Review of Systems Negative except as stated above  PHYSICAL EXAM: BP (!) 151/91   Pulse (!) 103   Resp 16   Wt 154 lb 3.2 oz (69.9 kg)   LMP 02/28/2011   SpO2 98%   BMI 25.66 kg/m   Physical Exam Repeat blood pressure today 162/100 with pulse of 94 And 168/102 with a pulse of 103  General appearance - alert, well appearing, and in no distress Mental status - normal mood, behavior, speech, dress, motor activity, and thought processes Neck - supple, no significant adenopathy Chest -  clear to auscultation, no wheezes, rales or rhonchi, symmetric air entry Heart - normal rate, regular rhythm, normal S1, S2, no murmurs, rubs, clicks or gallops Neurological - cranial nerves II through XII intact, DTR's normal and symmetric, motor and sensory grossly normal bilaterally, Romberg sign negative, normal gait and station.  Noted to have a little bit of a fine tremor in her hands.  Positive intention tremor both hands on finger-to-nose   CMP Latest Ref Rng & Units 11/04/2020 09/22/2020 08/18/2020  Glucose 65 - 99 mg/dL 92 - 130(H)  BUN 8 - 27 mg/dL 25 - 29(H)  Creatinine 0.57 - 1.00 mg/dL 0.79 - 1.42(H)  Sodium 134 - 144 mmol/L 138 - 134(L)  Potassium 3.5 - 5.2 mmol/L 4.3 - 4.1  Chloride 96 - 106 mmol/L 101 - 99  CO2 20 - 29 mmol/L 22 - 22  Calcium 8.7 - 10.3 mg/dL 10.0 - 10.0  Total Protein 6.0 - 8.5 g/dL - 7.2 -  Total Bilirubin 0.0 - 1.2 mg/dL - 0.2 -  Alkaline Phos 44 - 121 IU/L - 110 -  AST 0 - 40 IU/L - 20 -  ALT 0 - 32 IU/L - 23 -   Lipid Panel     Component Value Date/Time   CHOL 90 (L) 01/06/2021 0901   TRIG 219 (H) 01/06/2021 0901   HDL 55 01/06/2021 0901   CHOLHDL 1.6 01/06/2021 0901   CHOLHDL 7.2 12/16/2017 0714   VLDL 79 (H) 12/16/2017 0714   LDLCALC 3 01/06/2021 0901    CBC    Component Value Date/Time   WBC 7.7 08/18/2020 1703   RBC 4.85 08/18/2020 1703   HGB 14.7 08/18/2020 1703   HGB 12.7 12/18/2019 0900   HCT 44.5 08/18/2020 1703   HCT 38.6  12/18/2019 0900   PLT 470 (H) 08/18/2020 1703   PLT 451 (H) 12/18/2019 0900   MCV 91.8 08/18/2020 1703   MCV 94 12/18/2019 0900   MCH 30.3 08/18/2020 1703   MCHC 33.0 08/18/2020 1703   RDW 13.7 08/18/2020 1703   RDW 13.1 12/18/2019 0900   LYMPHSABS 1.3 05/10/2018 1218   MONOABS 0.6 02/10/2018 1741   EOSABS 0.1 05/10/2018 1218   BASOSABS 0.1 05/10/2018 1218    ASSESSMENT AND PLAN: 1. Migraine without aura and without status migrainosus, not intractable Headaches seem most consistent with migraine versus a cluster.  Elevated blood pressure may also be playing a role. Patient reports being on Imitrex many years ago with good results.  We cannot use Imitrex at this time given her history of CAD and elevated blood pressure. -Recommend using Fioricet for abortive therapy.  Advised patient that the medication can be habit-forming and to use only when needed.  Stop all other over-the-counter pain medications.  We will use Topamax for abortive therapy. -Stressed the importance of adequate sleep. -I would like to do imaging study to include an MRI and MRA of the brain and refer to neurology.  Patient wants to hold off on getting the imaging study until she has been approved for the orange card/cone discount.  She is working on turning in her application very soon. - butalbital-acetaminophen-caffeine (FIORICET) 50-325-40 MG tablet; Take 1-2 tablets by mouth every 8 (eight) hours as needed for headache.  Dispense: 20 tablet; Refill: 1 - topiramate (TOPAMAX) 25 MG tablet; Take 1 tablet (25 mg total) by mouth at bedtime.  Dispense: 30 tablet; Refill: 1  2. Essential hypertension Not at goal.  I will have her stop metoprolol and  change her to carvedilol instead for better blood pressure lowering effect.  Follow-up with clinical pharmacist in 4 to 7 days for recheck. - carvedilol (COREG) 25 MG tablet; Take 1 tablet (25 mg total) by mouth 2 (two) times daily with a meal. Stop Metoprolol  Dispense: 60  tablet; Refill: 3    Patient was given the opportunity to ask questions.  Patient verbalized understanding of the plan and was able to repeat key elements of the plan.   No orders of the defined types were placed in this encounter.    Requested Prescriptions   Signed Prescriptions Disp Refills   carvedilol (COREG) 25 MG tablet 60 tablet 3    Sig: Take 1 tablet (25 mg total) by mouth 2 (two) times daily with a meal. Stop Metoprolol   butalbital-acetaminophen-caffeine (FIORICET) 50-325-40 MG tablet 20 tablet 1    Sig: Take 1-2 tablets by mouth every 8 (eight) hours as needed for headache.   topiramate (TOPAMAX) 25 MG tablet 30 tablet 1    Sig: Take 1 tablet (25 mg total) by mouth at bedtime.    Return in about 1 month (around 05/06/2021) for Give appt with [facility] in 4 days to 1 wkf  for BP recheck.  Karle Plumber, MD, FACP

## 2021-04-13 ENCOUNTER — Ambulatory Visit: Payer: Self-pay | Admitting: Pharmacist

## 2021-05-04 ENCOUNTER — Ambulatory Visit: Payer: Self-pay | Admitting: Pharmacist

## 2021-05-06 ENCOUNTER — Other Ambulatory Visit: Payer: Self-pay

## 2021-05-07 ENCOUNTER — Ambulatory Visit: Payer: Self-pay | Admitting: Internal Medicine

## 2021-05-08 ENCOUNTER — Other Ambulatory Visit: Payer: Self-pay

## 2021-05-11 ENCOUNTER — Other Ambulatory Visit: Payer: Self-pay

## 2021-06-03 ENCOUNTER — Other Ambulatory Visit (HOSPITAL_COMMUNITY): Payer: Self-pay

## 2021-06-03 ENCOUNTER — Other Ambulatory Visit: Payer: Self-pay | Admitting: Internal Medicine

## 2021-06-03 DIAGNOSIS — G43009 Migraine without aura, not intractable, without status migrainosus: Secondary | ICD-10-CM

## 2021-06-04 ENCOUNTER — Other Ambulatory Visit (HOSPITAL_COMMUNITY): Payer: Self-pay

## 2021-06-04 MED ORDER — TOPIRAMATE 25 MG PO TABS
25.0000 mg | ORAL_TABLET | Freq: Every day | ORAL | 1 refills | Status: DC
  Filled 2021-06-04: qty 30, 30d supply, fill #0
  Filled 2021-06-25: qty 30, 30d supply, fill #1

## 2021-06-04 MED ORDER — LOSARTAN POTASSIUM 100 MG PO TABS
100.0000 mg | ORAL_TABLET | Freq: Every day | ORAL | 0 refills | Status: DC
  Filled 2021-06-04: qty 15, 15d supply, fill #0

## 2021-06-04 NOTE — Telephone Encounter (Signed)
Requested medications are due for refill today.  yes  Requested medications are on the active medications list.  yes  Last refill. 04/06/2021  Future visit scheduled.   yes  Notes to clinic. Medication not delegated.    Requested Prescriptions  Pending Prescriptions Disp Refills   topiramate (TOPAMAX) 25 MG tablet 30 tablet 1    Sig: Take 1 tablet (25 mg total) by mouth at bedtime.     Not Delegated - Neurology: Anticonvulsants - topiramate & zonisamide Failed - 06/03/2021 12:16 PM      Failed - This refill cannot be delegated      Passed - Cr in normal range and within 360 days    Creatinine, Ser  Date Value Ref Range Status  11/04/2020 0.79 0.57 - 1.00 mg/dL Final          Passed - CO2 in normal range and within 360 days    CO2  Date Value Ref Range Status  11/04/2020 22 20 - 29 mmol/L Final          Passed - Valid encounter within last 12 months    Recent Outpatient Visits           1 month ago Migraine without aura and without status migrainosus, not intractable   [place], Deborah B, MD   3 months ago Essential hypertension   [place], MD   7 months ago Essential hypertension   [place], MD   9 months ago Hospital discharge follow-up   [place], Zelda W, NP   1 year ago Need for influenza vaccination   [place], RPH-CPP       Future Appointments             In 4 weeks Ladell Pier, MD [place]

## 2021-06-12 ENCOUNTER — Other Ambulatory Visit: Payer: Self-pay

## 2021-06-12 ENCOUNTER — Other Ambulatory Visit (HOSPITAL_COMMUNITY): Payer: Self-pay

## 2021-06-25 ENCOUNTER — Other Ambulatory Visit (HOSPITAL_COMMUNITY): Payer: Self-pay

## 2021-06-25 ENCOUNTER — Other Ambulatory Visit: Payer: Self-pay | Admitting: Internal Medicine

## 2021-06-26 ENCOUNTER — Other Ambulatory Visit (HOSPITAL_COMMUNITY): Payer: Self-pay

## 2021-06-26 MED ORDER — LOSARTAN POTASSIUM 100 MG PO TABS
100.0000 mg | ORAL_TABLET | Freq: Every day | ORAL | 2 refills | Status: DC
  Filled 2021-06-26: qty 15, 15d supply, fill #0
  Filled 2021-07-15: qty 15, 15d supply, fill #1
  Filled 2021-08-02 – 2021-08-18 (×2): qty 15, 15d supply, fill #2

## 2021-06-30 ENCOUNTER — Other Ambulatory Visit: Payer: Self-pay

## 2021-06-30 ENCOUNTER — Telehealth: Payer: Self-pay

## 2021-06-30 NOTE — Telephone Encounter (Signed)
Called and lmom pt to let us know if they obtained insurance so we may process a pa for repatha. I will also send her a form as well.

## 2021-07-02 ENCOUNTER — Other Ambulatory Visit: Payer: Self-pay | Admitting: Internal Medicine

## 2021-07-02 ENCOUNTER — Ambulatory Visit: Payer: Self-pay | Admitting: Internal Medicine

## 2021-07-02 ENCOUNTER — Other Ambulatory Visit (HOSPITAL_COMMUNITY): Payer: Self-pay

## 2021-07-02 DIAGNOSIS — F3342 Major depressive disorder, recurrent, in full remission: Secondary | ICD-10-CM

## 2021-07-02 MED ORDER — VENLAFAXINE HCL ER 75 MG PO CP24
75.0000 mg | ORAL_CAPSULE | Freq: Every day | ORAL | 1 refills | Status: DC
  Filled 2021-07-02: qty 30, 30d supply, fill #0
  Filled 2021-07-21: qty 30, 30d supply, fill #1

## 2021-07-06 ENCOUNTER — Ambulatory Visit: Payer: Self-pay

## 2021-07-07 ENCOUNTER — Other Ambulatory Visit: Payer: Self-pay | Admitting: Internal Medicine

## 2021-07-07 DIAGNOSIS — I10 Essential (primary) hypertension: Secondary | ICD-10-CM

## 2021-07-08 ENCOUNTER — Other Ambulatory Visit (HOSPITAL_COMMUNITY): Payer: Self-pay

## 2021-07-08 MED ORDER — AMLODIPINE BESYLATE 10 MG PO TABS
10.0000 mg | ORAL_TABLET | Freq: Every day | ORAL | 1 refills | Status: DC
  Filled 2021-07-08: qty 30, 30d supply, fill #0
  Filled 2021-08-18: qty 30, 30d supply, fill #1

## 2021-07-08 NOTE — Telephone Encounter (Signed)
Requested Prescriptions  Pending Prescriptions Disp Refills   amLODipine (NORVASC) 10 MG tablet 30 tablet 1    Sig: Take 1 tablet (10 mg total) by mouth daily.     Cardiovascular: Calcium Channel Blockers 2 Failed - 07/07/2021  7:18 PM      Failed - Last BP in normal range    BP Readings from Last 1 Encounters:  04/06/21 (!) 151/91         Passed - Last Heart Rate in normal range    Pulse Readings from Last 1 Encounters:  04/06/21 (!) 103         Passed - Valid encounter within last 6 months    Recent Outpatient Visits          3 months ago Migraine without aura and without status migrainosus, not intractable   [place], MD   4 months ago Essential hypertension   [place], MD   8 months ago Essential hypertension   [place], MD   11 months ago Hospital discharge follow-up   [place], Zelda W, NP   1 year ago Need for influenza vaccination   [place], RPH-CPP      Future Appointments            In 1 month Kathlen Mody, Blair Dolphin, PA-C [place]   In 1 month Wynetta Emery, Dalbert Batman, MD [place]

## 2021-07-13 ENCOUNTER — Other Ambulatory Visit: Payer: Self-pay

## 2021-07-13 ENCOUNTER — Ambulatory Visit: Payer: Self-pay | Attending: Internal Medicine

## 2021-07-15 ENCOUNTER — Other Ambulatory Visit: Payer: Self-pay | Admitting: Internal Medicine

## 2021-07-15 DIAGNOSIS — G43009 Migraine without aura, not intractable, without status migrainosus: Secondary | ICD-10-CM

## 2021-07-16 ENCOUNTER — Other Ambulatory Visit (HOSPITAL_COMMUNITY): Payer: Self-pay

## 2021-07-16 MED ORDER — TOPIRAMATE 25 MG PO TABS
25.0000 mg | ORAL_TABLET | Freq: Every day | ORAL | 1 refills | Status: DC
  Filled 2021-07-16 – 2021-07-27 (×3): qty 30, 30d supply, fill #0
  Filled 2021-09-07 (×2): qty 30, 30d supply, fill #1

## 2021-07-16 NOTE — Telephone Encounter (Signed)
Requested Prescriptions  Pending Prescriptions Disp Refills   topiramate (TOPAMAX) 25 MG tablet 30 tablet 1    Sig: Take 1 tablet (25 mg total) by mouth at bedtime.     Neurology: Anticonvulsants - topiramate & zonisamide Passed - 07/15/2021  6:17 PM      Passed - Cr in normal range and within 360 days    Creatinine, Ser  Date Value Ref Range Status  11/04/2020 0.79 0.57 - 1.00 mg/dL Final         Passed - CO2 in normal range and within 360 days    CO2  Date Value Ref Range Status  11/04/2020 22 20 - 29 mmol/L Final         Passed - ALT in normal range and within 360 days    ALT  Date Value Ref Range Status  09/22/2020 23 0 - 32 IU/L Final         Passed - AST in normal range and within 360 days    AST  Date Value Ref Range Status  09/22/2020 20 0 - 40 IU/L Final         Passed - Completed PHQ-2 or PHQ-9 in the last 360 days      Passed - Valid encounter within last 12 months    Recent Outpatient Visits          3 months ago Migraine without aura and without status migrainosus, not intractable   [place], Deborah B, MD   4 months ago Essential hypertension   [place], Deborah B, MD   8 months ago Essential hypertension   [place], MD   11 months ago Hospital discharge follow-up   [place], Zelda W, NP   1 year ago Need for influenza vaccination   [place], Jarome Matin, RPH-CPP      Future Appointments            In 1 month Kathlen Mody, Blair Dolphin, PA-C [place], [facility]   In 1 month Wynetta Emery, Dalbert Batman, MD [place]

## 2021-07-21 ENCOUNTER — Other Ambulatory Visit: Payer: Self-pay

## 2021-07-21 ENCOUNTER — Other Ambulatory Visit (HOSPITAL_COMMUNITY): Payer: Self-pay

## 2021-07-22 ENCOUNTER — Other Ambulatory Visit (HOSPITAL_COMMUNITY): Payer: Self-pay

## 2021-07-27 ENCOUNTER — Other Ambulatory Visit (HOSPITAL_COMMUNITY): Payer: Self-pay

## 2021-07-27 ENCOUNTER — Other Ambulatory Visit: Payer: Self-pay

## 2021-08-02 ENCOUNTER — Other Ambulatory Visit (HOSPITAL_COMMUNITY): Payer: Self-pay

## 2021-08-02 ENCOUNTER — Other Ambulatory Visit: Payer: Self-pay | Admitting: Gastroenterology

## 2021-08-03 ENCOUNTER — Other Ambulatory Visit (HOSPITAL_COMMUNITY): Payer: Self-pay

## 2021-08-03 NOTE — Telephone Encounter (Signed)
Received amgen snf form from pt and faxing to manufacturer to see if they qualify to get medication for free from the pharmacy.  ?

## 2021-08-10 NOTE — Telephone Encounter (Signed)
Called and spoke w/pt to notify them that they were approved to receive the repatha free again from the manufacturer through 08/06/22. Pt voiced understanding and was advised to call should issues arise.  ?

## 2021-08-18 ENCOUNTER — Other Ambulatory Visit: Payer: Self-pay | Admitting: Internal Medicine

## 2021-08-18 ENCOUNTER — Other Ambulatory Visit (HOSPITAL_COMMUNITY): Payer: Self-pay

## 2021-08-18 DIAGNOSIS — I10 Essential (primary) hypertension: Secondary | ICD-10-CM

## 2021-08-19 ENCOUNTER — Other Ambulatory Visit (HOSPITAL_COMMUNITY): Payer: Self-pay

## 2021-08-19 MED ORDER — CARVEDILOL 25 MG PO TABS
25.0000 mg | ORAL_TABLET | Freq: Two times a day (BID) | ORAL | 0 refills | Status: DC
  Filled 2021-08-19: qty 60, 30d supply, fill #0

## 2021-08-20 ENCOUNTER — Other Ambulatory Visit (HOSPITAL_COMMUNITY): Payer: Self-pay

## 2021-08-24 NOTE — Progress Notes (Signed)
?Cardiology Office Note:   ? ?Date:  08/25/2021  ? ?ID:  Monique Hunt, DOB 09-Feb-1961, MRN [id number] ? ?PCP:  Ladell Pier, MD  ?[facility] Providers ?Cardiologist:  Dorris Carnes, MD ?Cardiology APP:  Liliane Shi, PA-C    ?Referring MD: Ladell Pier, MD  ? ?Chief Complaint:  F/u for CAD ?  ? ?Patient Profile: ?Coronary artery disease w/ angina ?S/p NSTEMI 7/19 >> PCI: DES to pLAD ?ETT-Echocardiogram 07/2018: no ischemia ?Cath 7/21:  oLAD 40, mLAD stent patent ?Rx with Isosorbide  ?Hypertension  ?Hyperlipidemia  ?Aortic atherosclerosis   ?  ?Prior CV studies: ?Cardiac catheterization 12/19/2019 ?LAD ostial 40, mid stent patent ?EF 55-65 ?  ?Stress echocardiogram 07/10/2018 ?No echocardiographic evidence for stress-induced ischemia ?No LV outflow tract obstruction at baseline or after exercise ?  ?Cardiac catheterization 12/16/2017 ?LAD proximal 75 ?EF 55-65 ?12/19/2017: PCI -3.5 x 12 mm Synergy DES to the LAD ? ?History of Present Illness:   ?Monique Hunt is a 61 y.o. female with the above problem list.  She was last seen in Dec 2021.  She returns for f/u.  She is here alone.  She is doing well without chest pain, shortness of breath, syncope, orthopnea, leg edema. She still works at [employer]. She has to do a lot of walking there. She has started to diet and has lost 20 lbs since Nov 22.       ?   ?Past Medical History:  ?Diagnosis Date  ? Coronary artery disease   ? Depression   ? History of kidney stones   ? Hyperlipidemia   ? Hypertension   ? Migraine   ? "stopped ~ 2010" (12/15/2017)  ? NSTEMI (non-ST elevated myocardial infarction) ([place]) 12/15/2017  ? ?Current Medications: ?Current Meds  ?Medication Sig  ? albuterol (VENTOLIN HFA) 108 (90 Base) MCG/ACT inhaler INHALE 2 PUFFS INTO THE LUNGS EVERY 4 (FOUR) HOURS AS NEEDED FOR WHEEZING OR SHORTNESS OF BREATH.  ? amLODipine (NORVASC) 10 MG tablet Take 1 tablet (10 mg total) by mouth daily.  ? aspirin 81 MG chewable tablet Chew 1 tablet (81  mg total) by mouth daily.  ? carvedilol (COREG) 25 MG tablet Take 1 tablet (25 mg total) by mouth 2 (two) times daily with a meal.  ? dicyclomine (BENTYL) 10 MG capsule TAKE 2 CAPSULES (20 MG TOTAL) BY MOUTH EVERY 8 (EIGHT) HOURS AS NEEDED FOR SPASMS. FOR ABDOMINAL CRAMPS AND DIARRHEA  ? esomeprazole (NEXIUM) 20 MG capsule Take 20 mg by mouth daily at 12 noon.  ? Evolocumab (REPATHA SURECLICK) 400 MG/ML SOAJ Inject 1 pen into the skin every 14 (fourteen) days.  ? isosorbide mononitrate (IMDUR) 30 MG 24 hr tablet Take 0.5 tablets (15 mg total) by mouth daily.  ? nitroGLYCERIN (NITROSTAT) 0.4 MG SL tablet Place 1 tablet (0.4 mg total) under the tongue every 5 (five) minutes x 3 doses as needed for chest pain.  ? rosuvastatin (CRESTOR) 20 MG tablet Take 1 tablet (20 mg total) by mouth daily.  ? topiramate (TOPAMAX) 25 MG tablet Take 1 tablet by mouth at bedtime.  ? venlafaxine XR (EFFEXOR-XR) 75 MG 24 hr capsule Take 1 capsule (75 mg total) by mouth daily with breakfast.  ? [DISCONTINUED] isosorbide mononitrate (IMDUR) 30 MG 24 hr tablet Take 1 tablet (30 mg total) by mouth daily.  ? [DISCONTINUED] losartan (COZAAR) 100 MG tablet Take 1 tablet (100 mg total) by mouth daily.  Please call to schedule an appt before anymore refills. [phone].  ?  ?  Allergies:   Patient has no known allergies.  ? ?Social History  ? ?Tobacco Use  ? Smoking status: Never  ? Smokeless tobacco: Never  ?Vaping Use  ? Vaping Use: Never used  ?Substance Use Topics  ? Alcohol use: Yes  ?  Comment: 3 TIMES A WEEK  ? Drug use: Never  ?  ?Family Hx: ?The patient's family history includes Breast cancer in her mother; CAD in her father; Heart disease in her father; Hyperlipidemia in her brother and sister. There is no history of Colon cancer, Esophageal cancer, Rectal cancer, or Stomach cancer. ? ?Review of Systems  ?Cardiovascular:  Negative for claudication.   ? ?EKGs/Labs/Other Test Reviewed:   ? ?EKG:  EKG is   ordered today.  The ekg ordered  today demonstrates normal sinus rhythm, HR 70, LAD, PRWP, QTc 412 ms  ? ?Recent Labs: ?09/22/2020: ALT 23 ?11/04/2020: BUN 25; Creatinine, Ser 0.79; Potassium 4.3; Sodium 138  ? ?Recent Lipid Panel ?Recent Labs  ?  01/06/21 ?0901  ?CHOL 90*  ?TRIG 219*  ?HDL 55  ?[place] 3  ?  ? ?Risk Assessment/Calculations:   ?  ?    ?Physical Exam:   ? ?VS:  BP 116/78 (BP Location: Right Arm, Patient Position: Sitting, Cuff Size: Normal)   Pulse 70   Ht 5' 5"  (1.651 m)   Wt 134 lb 6.4 oz (61 kg)   LMP 02/28/2011   SpO2 97%   BMI 22.37 kg/m?    ? ?Wt Readings from Last 3 Encounters:  ?08/25/21 134 lb 6.4 oz (61 kg)  ?04/06/21 154 lb 3.2 oz (69.9 kg)  ?03/06/21 154 lb 9.6 oz (70.1 kg)  ?  ?Constitutional:   ?   Appearance: Healthy appearance. Not in distress.  ?Neck:  ?   Vascular: No carotid bruit or JVR. JVD normal.  ?Pulmonary:  ?   Effort: Pulmonary effort is normal.  ?   Breath sounds: No wheezing. No rales.  ?Cardiovascular:  ?   Normal rate. Regular rhythm. Normal S1. Normal S2.   ?   Murmurs: There is no murmur.  ?Edema: ?   Peripheral edema absent.  ?Abdominal:  ?   Palpations: Abdomen is soft. There is no hepatomegaly.  ?Skin: ?   General: Skin is warm and dry.  ?Neurological:  ?   Mental Status: Alert and oriented to person, place and time.  ?   Cranial Nerves: Cranial nerves are intact.  ?  ?    ?ASSESSMENT & PLAN:   ?Coronary artery disease involving native coronary artery of native heart with angina pectoris ([place]) ?S/p NSTEMI in 2019 tx with DES to pLAD. Cath in 2021 with patent LAD stent.  She has done well without anginal symptoms on her current regimen. She would like to try to come off of Isosorbide. ?Ok to ? isosorbide to 15 mg once daily for 2-3 weeks then resume ?If angina recurs, resume Isosorbide 30 mg once daily ?If BP ? above 130/80, call ?Continue Norvasc 10 mg once daily, ASA 81 mg once daily, Coreg 25 mg twice daily, Repatha 140 mg q 2 weeks, Crestor 20 mg once daily  ?F/u 1 year.  ? ?Essential  hypertension ?BP is controlled.  Continue Norvasc 10 mg once daily, Coreg 25 mg twice daily, Losartan 100 mg once daily.  If BP ? above 130/80 after stopping Isosorbide, she knows to call.  ? ?Hyperlipidemia LDL goal <70 ?LDL in Aug 22 was 3!  This is excellent.  Her Triglycerides  were elevated at 219. We discussed limiting simple CHOs.  Arrange fasting CMET, Lipids 1 week prior to next OV in 1 year.  ? ?     ?   ?Dispo:  Return in about 1 year (around 08/26/2022) for Routine Follow Up, w/ Dr. Harrington Challenger, or Richardson Dopp, PA-C.  ? ?Medication Adjustments/Labs and Tests Ordered: ?Current medicines are reviewed at length with the patient today.  Concerns regarding medicines are outlined above.  ?Tests Ordered: ?Orders Placed This Encounter  ?Procedures  ? Lipid panel  ? Comp Met (CMET)  ? EKG 12-Lead  ? ?Medication Changes: ?Meds ordered this encounter  ?Medications  ? losartan (COZAAR) 100 MG tablet  ?  Sig: Take 1 tablet (100 mg total) by mouth daily.  Please call to schedule an appt before anymore refills. [phone].  ?  Dispense:  90 tablet  ?  Refill:  3  ? isosorbide mononitrate (IMDUR) 30 MG 24 hr tablet  ?  Sig: Take 0.5 tablets (15 mg total) by mouth daily.  ?  Dispense:  15 tablet  ?  Refill:  0  ? ?Signed, ?Richardson Dopp, PA-C  ?08/25/2021 12:06 PM    ?[facility] ?[address], [place], [place]  [address] ?Phone: [phone]; Fax: [phone]  ?

## 2021-08-25 ENCOUNTER — Ambulatory Visit: Payer: Self-pay | Attending: Internal Medicine | Admitting: Internal Medicine

## 2021-08-25 ENCOUNTER — Encounter: Payer: Self-pay | Admitting: Physician Assistant

## 2021-08-25 ENCOUNTER — Other Ambulatory Visit: Payer: Self-pay

## 2021-08-25 ENCOUNTER — Ambulatory Visit (INDEPENDENT_AMBULATORY_CARE_PROVIDER_SITE_OTHER): Payer: Self-pay | Admitting: Physician Assistant

## 2021-08-25 ENCOUNTER — Encounter: Payer: Self-pay | Admitting: Internal Medicine

## 2021-08-25 VITALS — BP 116/78 | HR 70 | Ht 65.0 in | Wt 134.4 lb

## 2021-08-25 VITALS — BP 130/80 | HR 69 | Resp 16 | Wt 152.0 lb

## 2021-08-25 DIAGNOSIS — E785 Hyperlipidemia, unspecified: Secondary | ICD-10-CM

## 2021-08-25 DIAGNOSIS — I10 Essential (primary) hypertension: Secondary | ICD-10-CM

## 2021-08-25 DIAGNOSIS — M7712 Lateral epicondylitis, left elbow: Secondary | ICD-10-CM

## 2021-08-25 DIAGNOSIS — I25119 Atherosclerotic heart disease of native coronary artery with unspecified angina pectoris: Secondary | ICD-10-CM

## 2021-08-25 DIAGNOSIS — K529 Noninfective gastroenteritis and colitis, unspecified: Secondary | ICD-10-CM

## 2021-08-25 MED ORDER — LOSARTAN POTASSIUM 100 MG PO TABS
100.0000 mg | ORAL_TABLET | Freq: Every day | ORAL | 3 refills | Status: DC
  Filled 2021-08-25 – 2021-09-04 (×2): qty 90, 90d supply, fill #0
  Filled 2021-11-29: qty 90, 90d supply, fill #1
  Filled 2021-11-30: qty 30, 30d supply, fill #1
  Filled 2022-01-01: qty 30, 30d supply, fill #2
  Filled 2022-01-31: qty 30, 30d supply, fill #3
  Filled 2022-02-26: qty 30, 30d supply, fill #4
  Filled 2022-03-29 (×2): qty 30, 30d supply, fill #5
  Filled 2022-04-30: qty 90, 90d supply, fill #6
  Filled 2022-08-07: qty 30, 30d supply, fill #7

## 2021-08-25 MED ORDER — ISOSORBIDE MONONITRATE ER 30 MG PO TB24: 15.0000 mg | ORAL_TABLET | Freq: Every day | ORAL | 0 refills | Status: DC

## 2021-08-25 MED ORDER — DICLOFENAC SODIUM 1 % EX GEL
2.0000 g | Freq: Four times a day (QID) | CUTANEOUS | 1 refills | Status: DC
  Filled 2021-08-25: qty 100, 12d supply, fill #0
  Filled 2021-11-22 – 2021-11-23 (×2): qty 100, 12d supply, fill #1

## 2021-08-25 NOTE — Assessment & Plan Note (Signed)
LDL in Aug 22 was 3!  This is excellent.  Her Triglycerides were elevated at 219. We discussed limiting simple CHOs.  Arrange fasting CMET, Lipids 1 week prior to next OV in 1 year.  ?

## 2021-08-25 NOTE — Assessment & Plan Note (Signed)
BP is controlled.  Continue Norvasc 10 mg once daily, Coreg 25 mg twice daily, Losartan 100 mg once daily.  If BP ? above 130/80 after stopping Isosorbide, she knows to call.  ?

## 2021-08-25 NOTE — Progress Notes (Signed)
? ? ?Patient ID: Monique Hunt, female    DOB: 04/05/1961  MRN: [id number] ? ?CC: stomach issues  (Diarrhea/Not able to make it to the bathroom to have bowel movements ), Hypertension, and Elbow Pain (Left ) ? ? ?Subjective: ?Monique Hunt is a 61 y.o. female who presents for follow up visit ?Her concerns today include:  ?Patient with history of HTN, CAD one stent to LAD 11/2017, depression, HL, LT thyroid nodule (no further f/u needed per endocrinology Dr. Kelton Pillar), RT lung nodule (stable in size on CT 07/2020) ?  ?HTN/CAD: Reports compliance with medication including amlodipine, Cozaar, Imdur, Crestor, carvedilol and Repatha.  Seen by cardiology PA today. ?BP 116/78 at cardiologist today.  Isosorbide decreased to 15 mg daily ?BP at home has been good ?Trying to limit salt ? ?C/o diarrhea  for the past 2-3 mths. Most of time BM is soft.  Diarrhea is associated with a lot of gas and bloating.  She has noted intermittent pain across the lower abdomen and also with the diarrhea. ?She has had urgency with incontinence to the extent that she has to take extra clothing with her to work in case she has an incontinence accident.  Noticed bright red blood in the stools about once a week.  She has not had any fevers.  No recent use of antibiotics. ?She has cut out foods that she thinks may contribute including caffeine, diet sodas.  She drinks 1% milk on a daily basis.Marland Kitchen ?For a 4 to 5-week period she noted that the stools were clay colored. Now stools are darker.   ?Only thing that seems to help is Imodium.  She takes 2 of those but then she does not have a bowel movement for 4 to 5 days. ?Cut back ETOH.  Drinks a  few glasses wine 2x/mth. ?Has appt with GI 09/12/2021 Dr. Loletha Carrow ? ? ?Complains of pain and soreness in the left elbow x3 weeks.  She points to the lateral epicondyles.  She works at [employer] as a Scientist, water quality.  States that it hurts to do any type of lifting or use her price gun to put prices on merchandise.    ? ?Patient Active Problem List  ? Diagnosis Date Noted  ? Migraine without aura and without status migrainosus, not intractable 04/06/2021  ? Prediabetes 11/05/2020  ? Coronary artery disease involving native coronary artery of native heart with angina pectoris ([place]) 08/06/2020  ? Low TSH level 08/04/2018  ? Chronic diarrhea 06/05/2018  ? Left thyroid nodule 04/07/2018  ? Lung nodule, solitary 04/07/2018  ? Hyperlipidemia LDL goal <70   ? Old MI (myocardial infarction) 12/15/2017  ? Essential hypertension 12/01/2017  ? Depression 06/04/2012  ? Nephrolithiasis 09/27/2011  ?  ? ?Current Outpatient Medications on File Prior to Visit  ?Medication Sig Dispense Refill  ? albuterol (VENTOLIN HFA) 108 (90 Base) MCG/ACT inhaler INHALE 2 PUFFS INTO THE LUNGS EVERY 4 (FOUR) HOURS AS NEEDED FOR WHEEZING OR SHORTNESS OF BREATH. 18 g 0  ? amLODipine (NORVASC) 10 MG tablet Take 1 tablet (10 mg total) by mouth daily. 30 tablet 1  ? aspirin 81 MG chewable tablet Chew 1 tablet (81 mg total) by mouth daily. 30 tablet 11  ? carvedilol (COREG) 25 MG tablet Take 1 tablet (25 mg total) by mouth 2 (two) times daily with a meal. 60 tablet 0  ? dicyclomine (BENTYL) 10 MG capsule TAKE 2 CAPSULES (20 MG TOTAL) BY MOUTH EVERY 8 (EIGHT) HOURS AS NEEDED FOR SPASMS. FOR ABDOMINAL  CRAMPS AND DIARRHEA 45 capsule 1  ? esomeprazole (NEXIUM) 20 MG capsule Take 20 mg by mouth daily at 12 noon.    ? Evolocumab (REPATHA SURECLICK) 852 MG/ML SOAJ Inject 1 pen into the skin every 14 (fourteen) days.    ? isosorbide mononitrate (IMDUR) 30 MG 24 hr tablet Take 0.5 tablets (15 mg total) by mouth daily. 15 tablet 0  ? losartan (COZAAR) 100 MG tablet Take 1 tablet (100 mg total) by mouth daily.  Please call to schedule an appt before anymore refills. [phone]. 90 tablet 3  ? nitroGLYCERIN (NITROSTAT) 0.4 MG SL tablet Place 1 tablet (0.4 mg total) under the tongue every 5 (five) minutes x 3 doses as needed for chest pain. 30 tablet 3  ? rosuvastatin  (CRESTOR) 20 MG tablet Take 1 tablet (20 mg total) by mouth daily. 90 tablet 3  ? topiramate (TOPAMAX) 25 MG tablet Take 1 tablet by mouth at bedtime. 30 tablet 1  ? venlafaxine XR (EFFEXOR-XR) 75 MG 24 hr capsule Take 1 capsule (75 mg total) by mouth daily with breakfast. 30 capsule 1  ? ?No current facility-administered medications on file prior to visit.  ? ? ?No Known Allergies ? ?Social History  ? ?Socioeconomic History  ? Marital status: Divorced  ?  Spouse name: Not on file  ? Number of children: 2  ? Years of education: Not on file  ? Highest education level: Not on file  ?Occupational History  ? Not on file  ?Tobacco Use  ? Smoking status: Never  ? Smokeless tobacco: Never  ?Vaping Use  ? Vaping Use: Never used  ?Substance and Sexual Activity  ? Alcohol use: Yes  ?  Comment: 3 TIMES A WEEK  ? Drug use: Never  ? Sexual activity: Yes  ?  Comment: menopausal  ?Other Topics Concern  ? Not on file  ?Social History Narrative  ? Not on file  ? ?Social Determinants of Health  ? ?Financial Resource Strain: Not on file  ?Food Insecurity: Not on file  ?Transportation Needs: Not on file  ?Physical Activity: Not on file  ?Stress: Not on file  ?Social Connections: Not on file  ?Intimate Partner Violence: Not on file  ? ? ?Family History  ?Problem Relation Age of Onset  ? CAD Father   ? Heart disease Father   ? Breast cancer Mother   ? Hyperlipidemia Sister   ? Hyperlipidemia Brother   ? Colon cancer Neg Hx   ? Esophageal cancer Neg Hx   ? Rectal cancer Neg Hx   ? Stomach cancer Neg Hx   ? ? ?Past Surgical History:  ?Procedure Laterality Date  ? CORONARY STENT INTERVENTION N/A 12/19/2017  ? Procedure: CORONARY STENT INTERVENTION;  Surgeon: Belva Crome, MD;  Location: [facility];  Service: Cardiovascular;  Laterality: N/A;  ? CYSTOSCOPY W/ STONE MANIPULATION    ? FACIAL FRACTURE SURGERY  1979  ? LEFT HEART CATH AND CORONARY ANGIOGRAPHY N/A 12/16/2017  ? Procedure: LEFT HEART CATH AND CORONARY ANGIOGRAPHY;   Surgeon: Belva Crome, MD;  Location: [facility];  Service: Cardiovascular;  Laterality: N/A;  ? LEFT HEART CATH AND CORONARY ANGIOGRAPHY N/A 12/19/2019  ? Procedure: LEFT HEART CATH AND CORONARY ANGIOGRAPHY;  Surgeon: Lorretta Harp, MD;  Location: [facility];  Service: Cardiovascular;  Laterality: N/A;  ? LITHOTRIPSY    ? SKIN SURGERY Right   ? "birthmark removed under my arm when I was little"  ? ? ?  ROS: ?Review of Systems ?Negative except as stated above ? ?PHYSICAL EXAM: ?BP 130/80   Pulse 69   Resp 16   Wt 152 lb (68.9 kg)   LMP 02/28/2011   SpO2 99%   BMI 25.29 kg/m?   ?Wt Readings from Last 3 Encounters:  ?08/25/21 152 lb (68.9 kg)  ?08/25/21 134 lb 6.4 oz (61 kg)  ?04/06/21 154 lb 3.2 oz (69.9 kg)  ? ? ?Physical Exam ? ?General appearance - alert, well appearing, and in no distress ?Mental status - normal mood, behavior, speech, dress, motor activity, and thought processes ?Chest - clear to auscultation, no wheezes, rales or rhonchi, symmetric air entry ?Heart - normal rate, regular rhythm, normal S1, S2, no murmurs, rubs, clicks or gallops ?Musculoskeletal -left elbow: Mild enlargement of the lateral epicondyles.  Mild tenderness on palpation over this bony prominence.  She expresses discomfort with passive range of motion of the elbow. ?Extremities - peripheral pulses normal, no pedal edema, no clubbing or cyanosis ? ? ? ?  Latest Ref Rng & Units 11/04/2020  ?  4:43 PM 09/22/2020  ? 10:44 AM 08/18/2020  ?  5:03 PM  ?CMP  ?Glucose 65 - 99 mg/dL 92    130    ?BUN 8 - 27 mg/dL 25    29    ?Creatinine 0.57 - 1.00 mg/dL 0.79    1.42    ?Sodium 134 - 144 mmol/L 138    134    ?Potassium 3.5 - 5.2 mmol/L 4.3    4.1    ?Chloride 96 - 106 mmol/L 101    99    ?CO2 20 - 29 mmol/L 22    22    ?Calcium 8.7 - 10.3 mg/dL 10.0    10.0    ?Total Protein 6.0 - 8.5 g/dL  7.2     ?Total Bilirubin 0.0 - 1.2 mg/dL  0.2     ?Alkaline Phos 44 - 121 IU/L  110     ?AST 0 - 40 IU/L  20     ?ALT 0 - 32 IU/L  23      ? ?Lipid Panel  ?   ?Component Value Date/Time  ? CHOL 90 (L) 01/06/2021 0901  ? TRIG 219 (H) 01/06/2021 0901  ? HDL 55 01/06/2021 0901  ? CHOLHDL 1.6 01/06/2021 0901  ? CHOLHDL 7.2 12/16/2017 0714  ? VLDL 79 (H) 12/17/18

## 2021-08-25 NOTE — Patient Instructions (Signed)
Medication Instructions:  ?Your physician has recommended you make the following change in your medication:  ? REDUCE the Imdur to 1/2 tablet for 2 weeks then you can stop it.  If you have recurring chest pain or your blood pressure increases over 130/80, call and let us know ? ?*If you need a refill on your cardiac medications before your next appointment, please call your pharmacy* ? ? ?Lab Work: ?1 WEEK BEFORE YOUR NEXT APPT IN 1 YEAR:  FASTING LIPID & CMET ? ?If you have labs (blood work) drawn today and your tests are completely normal, you will receive your results only by: ?MyChart Message (if you have MyChart) OR ?A paper copy in the mail ?If you have any lab test that is abnormal or we need to change your treatment, we will call you to review the results. ? ? ?Testing/Procedures: ?None ordered ? ? ?Follow-Up: ?At [facility], you and your health needs are our priority.  As part of our continuing mission to provide you with exceptional heart care, we have created designated Provider Care Teams.  These Care Teams include your primary Cardiologist (physician) and Advanced Practice Providers (APPs -  Physician Assistants and Nurse Practitioners) who all work together to provide you with the care you need, when you need it. ? ?We recommend signing up for the patient portal called "MyChart".  Sign up information is provided on this After Visit Summary.  MyChart is used to connect with patients for Virtual Visits (Telemedicine).  Patients are able to view lab/test results, encounter notes, upcoming appointments, etc.  Non-urgent messages can be sent to your provider as well.   ?To learn more about what you can do with MyChart, go to [email or web address].   ? ?Your next appointment:   ?12 month(s) ? ?The format for your next appointment:   ?In Person ? ?Provider:   ?Dorris Carnes, MD  or Richardson Dopp, PA-C       ? ? ?Other Instructions ? ?

## 2021-08-25 NOTE — Assessment & Plan Note (Signed)
S/p NSTEMI in 2019 tx with DES to pLAD. Cath in 2021 with patent LAD stent.  She has done well without anginal symptoms on her current regimen. She would like to try to come off of Isosorbide. ?? Ok to ? isosorbide to 15 mg once daily for 2-3 weeks then resume ?? If angina recurs, resume Isosorbide 30 mg once daily ?? If BP ? above 130/80, call ?? Continue Norvasc 10 mg once daily, ASA 81 mg once daily, Coreg 25 mg twice daily, Repatha 140 mg q 2 weeks, Crestor 20 mg once daily  ?? F/u 1 year.  ?

## 2021-08-26 LAB — COMPREHENSIVE METABOLIC PANEL
ALT: 29 IU/L (ref 0–32)
AST: 24 IU/L (ref 0–40)
Albumin/Globulin Ratio: 1.9 (ref 1.2–2.2)
Albumin: 5.2 g/dL — ABNORMAL HIGH (ref 3.8–4.8)
Alkaline Phosphatase: 117 IU/L (ref 44–121)
BUN/Creatinine Ratio: 22 (ref 12–28)
BUN: 17 mg/dL (ref 8–27)
Bilirubin Total: 0.4 mg/dL (ref 0.0–1.2)
CO2: 22 mmol/L (ref 20–29)
Calcium: 10.4 mg/dL — ABNORMAL HIGH (ref 8.7–10.3)
Chloride: 103 mmol/L (ref 96–106)
Creatinine, Ser: 0.79 mg/dL (ref 0.57–1.00)
Globulin, Total: 2.7 g/dL (ref 1.5–4.5)
Glucose: 112 mg/dL — ABNORMAL HIGH (ref 70–99)
Potassium: 5.1 mmol/L (ref 3.5–5.2)
Sodium: 137 mmol/L (ref 134–144)
Total Protein: 7.9 g/dL (ref 6.0–8.5)
eGFR: 85 mL/min/{1.73_m2} (ref >59)

## 2021-08-26 LAB — CBC
Hematocrit: 40.8 % (ref 34.0–46.6)
Hemoglobin: 13.5 g/dL (ref 11.1–15.9)
MCH: 30.3 pg (ref 26.6–33.0)
MCHC: 33.1 g/dL (ref 31.5–35.7)
MCV: 92 fL (ref 79–97)
Platelets: 416 10*3/uL (ref 150–450)
RBC: 4.45 x10E6/uL (ref 3.77–5.28)
RDW: 12.1 % (ref 11.7–15.4)
WBC: 5.4 10*3/uL (ref 3.4–10.8)

## 2021-08-28 ENCOUNTER — Other Ambulatory Visit: Payer: Self-pay | Admitting: Internal Medicine

## 2021-08-28 ENCOUNTER — Other Ambulatory Visit (HOSPITAL_COMMUNITY): Payer: Self-pay

## 2021-08-28 DIAGNOSIS — F3342 Major depressive disorder, recurrent, in full remission: Secondary | ICD-10-CM

## 2021-08-28 MED ORDER — VENLAFAXINE HCL ER 75 MG PO CP24
75.0000 mg | ORAL_CAPSULE | Freq: Every day | ORAL | 3 refills | Status: DC
  Filled 2021-08-28: qty 30, 30d supply, fill #0
  Filled 2021-09-28: qty 30, 30d supply, fill #1
  Filled 2021-10-18: qty 30, 30d supply, fill #2
  Filled 2021-10-21: qty 30, 30d supply, fill #0
  Filled 2021-10-21: qty 30, 30d supply, fill #2
  Filled 2021-11-11: qty 30, 30d supply, fill #1

## 2021-09-05 ENCOUNTER — Other Ambulatory Visit (HOSPITAL_COMMUNITY): Payer: Self-pay

## 2021-09-07 ENCOUNTER — Other Ambulatory Visit: Payer: Self-pay | Admitting: Pharmacist

## 2021-09-07 ENCOUNTER — Other Ambulatory Visit (HOSPITAL_COMMUNITY): Payer: Self-pay

## 2021-09-07 ENCOUNTER — Other Ambulatory Visit: Payer: Self-pay

## 2021-09-07 MED ORDER — REPATHA SURECLICK 140 MG/ML ~~LOC~~ SOAJ
1.0000 "pen " | SUBCUTANEOUS | 11 refills | Status: DC
  Filled 2021-09-07: qty 2, fill #0
  Filled 2021-11-22: qty 2, 28d supply, fill #0

## 2021-09-08 ENCOUNTER — Other Ambulatory Visit: Payer: Self-pay

## 2021-09-08 ENCOUNTER — Telehealth: Payer: Self-pay | Admitting: Pharmacist

## 2021-09-08 ENCOUNTER — Other Ambulatory Visit: Payer: Self-pay | Admitting: Internal Medicine

## 2021-09-08 NOTE — Telephone Encounter (Signed)
Received msg from another provider that pt hasn't been getting her Repatha. She was already approved through SNF and just needs to contact [employer] for her refills. I have called pt and left message with their phone # advising her to do so. ?

## 2021-09-08 NOTE — Telephone Encounter (Signed)
Received msg from another provider that pt hasn't been getting her Repatha. She was already approved through SNF and just needs to contact [employer] for her refills. I have called pt and left message advising her to do so. ?

## 2021-09-11 ENCOUNTER — Ambulatory Visit (INDEPENDENT_AMBULATORY_CARE_PROVIDER_SITE_OTHER): Payer: Self-pay | Admitting: Gastroenterology

## 2021-09-11 ENCOUNTER — Encounter: Payer: Self-pay | Admitting: Gastroenterology

## 2021-09-11 ENCOUNTER — Other Ambulatory Visit: Payer: Self-pay

## 2021-09-11 VITALS — BP 112/70 | HR 74 | Ht 65.0 in | Wt 155.2 lb

## 2021-09-11 DIAGNOSIS — K529 Noninfective gastroenteritis and colitis, unspecified: Secondary | ICD-10-CM

## 2021-09-11 DIAGNOSIS — K625 Hemorrhage of anus and rectum: Secondary | ICD-10-CM

## 2021-09-11 DIAGNOSIS — R1032 Left lower quadrant pain: Secondary | ICD-10-CM

## 2021-09-11 LAB — STOOL CULTURE: E coli, Shiga toxin Assay: NEGATIVE

## 2021-09-11 MED ORDER — DICYCLOMINE HCL 10 MG PO CAPS
10.0000 mg | ORAL_CAPSULE | Freq: Three times a day (TID) | ORAL | 3 refills | Status: DC | PRN
  Filled 2021-09-11: qty 90, 30d supply, fill #0
  Filled 2021-11-29 – 2021-11-30 (×2): qty 90, 30d supply, fill #1
  Filled 2022-02-26: qty 90, 30d supply, fill #2
  Filled 2022-04-25: qty 90, 30d supply, fill #3

## 2021-09-11 NOTE — Patient Instructions (Addendum)
Your provider has requested that you go to the basement level for lab work before leaving today. Press "B" on the elevator. The lab is located at the first door on the left as you exit the elevator. ? ?We have sent the following medications to your pharmacy for you to pick up at your convenience: ?Dicyclomine 10 mg three times daily as needed ? ?If you are age 62 or older, your body mass index should be between 23-30. Your Body mass index is 25.83 kg/m?Marland Kitchen If this is out of the aforementioned range listed, please consider follow up with your Primary Care Provider. ? ?If you are age 44 or younger, your body mass index should be between 19-25. Your Body mass index is 25.83 kg/m?Marland Kitchen If this is out of the aformentioned range listed, please consider follow up with your Primary Care Provider.  ? ?________________________________________________________ ? ?The [facility] providers would like to encourage you to use [facility] to communicate with providers for non-urgent requests or questions.  Due to long hold times on the telephone, sending your provider a message by [facility] may be a faster and more efficient way to get a response.  Please allow 48 business hours for a response.  Please remember that this is for non-urgent requests.  ?_______________________________________________________ ? ?Due to recent changes in healthcare laws, you may see the results of your imaging and laboratory studies on MyChart before your provider has had a chance to review them.  We understand that in some cases there may be results that are confusing or concerning to you. Not all laboratory results come back in the same time frame and the provider may be waiting for multiple results in order to interpret others.  Please give [place] 48 hours in order for your provider to thoroughly review all the results before contacting the office for clarification of your results.  ? ? ?____________________________________________________ ? ?Food Guidelines for  those with chronic digestive trouble: ? ?Many people have difficulty digesting certain foods, causing a variety of distressing and embarrassing symptoms such as abdominal pain, bloating and gas.  These foods may need to be avoided or consumed in small amounts.  Here are some tips that might be helpful for you. ? ?1.   Lactose intolerance is the difficulty or complete inability to digest lactose, the natural sugar in milk and anything made from milk.  This condition is harmless, common, and can begin any time during life.  Some people can digest a modest amount of lactose while others cannot tolerate any.  Also, not all dairy products contain equal amounts of lactose.  For example, hard cheeses such as parmesan have less lactose than soft cheeses such as cheddar.  Yogurt has less lactose than milk or cheese.  Many packaged foods (even many brands of bread) have milk, so read ingredient lists carefully.  It is difficult to test for lactose intolerance, so just try avoiding lactose as much as possible for a week and see what happens with your symptoms.  If you seem to be lactose intolerant, the best plan is to avoid it (but make sure you get calcium from another source).  The next best thing is to use lactase enzyme supplements, available over the counter everywhere.  Just know that many lactose intolerant people need to take several tablets with each serving of dairy to avoid symptoms.  Lastly, a lot of restaurant food is made with milk or butter.  Many are things you might not suspect, such as mashed potatoes,  rice and pasta (cooked with butter) and "grilled" items.  If you are lactose intolerant, it never hurts to ask your server what has milk or butter. ? ?2.   Fiber is an important part of your diet, but not all fiber is well-tolerated.  Insoluble fiber such as bran is often consumed by normal gut bacteria and converted into gas.  Soluble fiber such as oats, squash, carrots and green beans are typically tolerated  better. ? ?3.   Some types of carbohydrates can be poorly digested.  Examples include: fructose (apples, cherries, pears, raisins and other dried fruits), fructans (onions, zucchini, large amounts of wheat), sorbitol/mannitol/xylitol and sucralose/Splenda (common artificial sweeteners), and raffinose (lentils, broccoli, cabbage, asparagus, brussel sprouts, many types of beans).  ?Do a Development worker, community for FODMAP diet and you will find helpful information. ?Beano, a dietary supplement, will often help with raffinose-containing foods.  As with lactase tablets, you may need several per serving. ? ?4.   Whenever possible, avoid processed food&meats and chemical additives.  High fructose corn syrup, a common sweetener, may be difficult to digest.  Eggs and soy (comes from the soybean, and added to many foods now) are other common bloating/gassy foods. ? ?5.  Regarding gluten:  gluten is a protein mainly found in wheat, but also rye and barley.  There is a condition called celiac sprue, which is an inflammatory reaction in the small intestine causing a variety of digestive symptoms.  Blood testing is highly reliable to look for this condition, and sometimes upper endoscopy with small bowel biopsies may be necessary to make the diagnosis.  Many patients who test negative for celiac sprue report improvement in their digestive symptoms when they switch to a gluten-free diet.  However, in these "non-celiac gluten sensitive" patients, the true role of gluten in their symptoms is unclear.  Reducing carbohydrates in general may decrease the gas and bloating caused when gut bacteria consume carbs. Also, some of these patients may actually be intolerant of the baker's yeast in bread products rather than the gluten.  Flatbread and other reduced yeast breads might therefore be tolerated.  There is no specific testing available for most food intolerances, which are discovered mainly by dietary elimination.  Please do not embark on a gluten  free diet unless directed by your doctor, as it is highly restrictive, and may lead to nutritional deficiencies if not carefully monitored.  Lastly, beware of internet claims offering "personalized" tests for food intolerances.  Such testing has no reliable scientific evidence to support its reliability and correlation to symptoms.   ? ?6.  The best advice is old advice, especially for those with chronic digestive trouble - try to eat "clean".  Balanced diet, avoid processed food, plenty of fruits and vegetables, cut down the sugar, minimal alcohol, avoid tobacco. ?Make time to care for yourself, get enough sleep, exercise when you can, reduce stress.  Your guts will thank you for it. ? ? ?- Dr. Wilfrid Lund ?[facility] ? ?________________________________________________________ ? ? ? ?

## 2021-09-11 NOTE — Progress Notes (Signed)
? ? ? ?[facility] Progress Note ? ?Chief Complaint: Chronic diarrhea ? ?Subjective  ?History: ?From my December 2020 office consult note: ?"Monique Hunt is a very pleasant 61 year old woman referred by primary care for about 6 months of lower abdominal pain and diarrhea. ?Without any clear trigger, she started having frequent mid lower abdominal sharp and crampy pain before bowel movements with feelings of urgency.  She would have several semiformed to loose BMs per day.  Perhaps every couple of weeks there would be some self-limited painless rectal bleeding with the diarrhea as well. ?Her appetite is remained good, she had some purposeful weight loss through dieting earlier this year but stable since.  She denies dysphagia, odynophagia, nausea or vomiting. ?  ?She has not had any particular testing for this lately.  Symptoms did not improve cutting out dairy, caffeine, alcohol. ?  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 ?Colonoscopy findings: ?The examined portion of the ileum was normal. ?- Diverticulosis in the entire examined colon. ?- One diminutive polyp in the transverse colon, removed with a cold snare. Resected and ?retrieved. ?- Normal mucosa in the entire examined colon. Biopsied. ?____________________________________________ ? ?Monique Hunt is here for reevaluation of diarrhea.  She says that after the colonoscopy she improved in the months following, and suspects that may have been from the dicyclomine.  She was well until about 4 months ago, when she had a return of frequent crampy lower abdominal pain, bloating and gas with several loose stools per day with urgency.  She has occasional small-volume bleeding as before, usually at the end of a bowel movement.  ?Stools are semiformed to loose, sometimes floating, not greasy/oily and no weight loss.  ? She denies rash, painful eye redness or joint swelling.  Peripheral edema at the end of the  day managed by elevating her feet. ?The symptoms have been troubling in her work as a Scientist, water quality, and she started taking Imodium that helped control the diarrhea but not the other symptoms. ? ?ROS: ?Cardiovascular:  no chest pain ?Respiratory: no dyspnea ?Arthritic symptoms for which she uses Voltaren gel and will take an NSAID like Aleve or ibuprofen perhaps twice a week. ?Remainder systems negative except as above ?The patient's Past Medical, Family and Social History were reviewed and are on file in the EMR. ?Since I last saw her, she is off Plavix and on aspirin monotherapy for her CAD. ? ?Objective: ? ?Med list reviewed ? ?Current Outpatient Medications:  ?  albuterol (VENTOLIN HFA) 108 (90 Base) MCG/ACT inhaler, INHALE 2 PUFFS INTO THE LUNGS EVERY 4 (FOUR) HOURS AS NEEDED FOR WHEEZING OR SHORTNESS OF BREATH., Disp: 18 g, Rfl: 0 ?  amLODipine (NORVASC) 10 MG tablet, Take 1 tablet (10 mg total) by mouth daily., Disp: 30 tablet, Rfl: 1 ?  aspirin 81 MG chewable tablet, Chew 1 tablet (81 mg total) by mouth daily., Disp: 30 tablet, Rfl: 11 ?  carvedilol (COREG) 25 MG tablet, Take 1 tablet (25 mg total) by mouth 2 (two) times daily with a meal., Disp: 60 tablet, Rfl: 0 ?  diclofenac Sodium (VOLTAREN) 1 % GEL, Apply 2 g topically 4 (four) times daily., Disp: 100 g, Rfl: 1 ?  esomeprazole (NEXIUM) 20 MG capsule, Take 20 mg by mouth daily at 12 noon., Disp: , Rfl:  ?  Evolocumab (REPATHA SURECLICK) 505 MG/ML SOAJ, Inject 1 pen. into the skin every 14 (fourteen) days., Disp: 2 mL, Rfl: 11 ?  losartan (COZAAR) 100 MG tablet, Take 1 tablet (100 mg total) by mouth daily.  Please call to schedule an appt before anymore refills. [phone]., Disp: 90 tablet, Rfl: 3 ?  nitroGLYCERIN (NITROSTAT) 0.4 MG SL tablet, Place 1 tablet (0.4 mg total) under the tongue every 5 (five) minutes x 3 doses as needed for chest pain., Disp: 30 tablet, Rfl: 3 ?  rosuvastatin (CRESTOR) 20 MG tablet, Take 1 tablet (20 mg total) by mouth daily.,  Disp: 90 tablet, Rfl: 3 ?  topiramate (TOPAMAX) 25 MG tablet, Take 1 tablet by mouth at bedtime., Disp: 30 tablet, Rfl: 1 ?  venlafaxine XR (EFFEXOR-XR) 75 MG 24 hr capsule, Take 1 capsule by mouth daily with breakfast., Disp: 30 capsule, Rfl: 3 ?  dicyclomine (BENTYL) 10 MG capsule, Take 1 capsule (10 mg total) by mouth 3 (three) times daily as needed., Disp: 90 capsule, Rfl: 3 ? ? ?Vital signs in last 24 hrs: ?Vitals:  ? 09/11/21 0826  ?BP: 112/70  ?Pulse: 74  ?SpO2: 97%  ? ?Wt Readings from Last 3 Encounters:  ?09/11/21 155 lb 3.2 oz (70.4 kg)  ?08/25/21 152 lb (68.9 kg)  ?08/25/21 134 lb 6.4 oz (61 kg)  ?  ?Physical Exam ? ?She is well-appearing ?HEENT: sclera anicteric, oral mucosa moist without lesions ?Neck: supple, no thyromegaly, JVD or lymphadenopathy ?Cardiac: RRR without murmurs, S1S2 heard, no peripheral edema ?Pulm: clear to auscultation bilaterally, normal RR and effort noted ?Abdomen: soft, no tenderness, with active bowel sounds. No guarding or palpable hepatosplenomegaly.  No bruit ?Skin; warm and dry, no jaundice or rash ? ?Labs: ? ?Colonoscopy pathology results: ? ?1. Surgical [P], random sites ?- BENIGN COLONIC MUCOSA. ?- NO ACTIVE INFLAMMATION OR EVIDENCE OF MICROSCOPIC COLITIS. ?- NO DYSPLASIA OR MALIGNANCY. ?2. Surgical [P], colon, transverse, polyp ?- TUBULAR ADENOMA. ?- NO HIGH GRADE DYSPLASIA OR MALIGNANCY. ? ? ?____________________________________________ ?Other: ? ?No celiac antibodies done ?_____________________________________________ ?Assessment & Plan  ?Assessment: ?Encounter Diagnoses  ?Name Primary?  ? Chronic diarrhea Yes  ? Abdominal pain, left lower quadrant   ? Rectal bleeding   ? ?Similar symptoms to before, seems consistent with IBS and she improved on dicyclomine in the past. ?Symptoms recurrent, must consider other causes, especially infectious or celiac.  No risk factors for SIBO or EPI, weight has been stable. ?Benign anal and probably hemorrhoidal  bleeding ? ? ?Plan: ?Stool for C diff PCR and GI path panel ?Bloating and gas dietary advice ?Dicyclomine ?Celiac antibodies ? ? ?Monique Hunt ? ?

## 2021-09-13 LAB — TISSUE TRANSGLUTAMINASE, IGA: (tTG) Ab, IgA: <1 U/mL

## 2021-09-13 LAB — IGA: Immunoglobulin A: 153 mg/dL (ref 70–320)

## 2021-09-14 ENCOUNTER — Other Ambulatory Visit: Payer: Self-pay | Admitting: Internal Medicine

## 2021-09-14 ENCOUNTER — Other Ambulatory Visit (HOSPITAL_COMMUNITY): Payer: Self-pay

## 2021-09-14 DIAGNOSIS — I10 Essential (primary) hypertension: Secondary | ICD-10-CM

## 2021-09-15 ENCOUNTER — Other Ambulatory Visit (HOSPITAL_COMMUNITY): Payer: Self-pay

## 2021-09-15 MED ORDER — AMLODIPINE BESYLATE 10 MG PO TABS
10.0000 mg | ORAL_TABLET | Freq: Every day | ORAL | 1 refills | Status: DC
  Filled 2021-09-15: qty 90, 90d supply, fill #0

## 2021-09-15 NOTE — Telephone Encounter (Signed)
Requested Prescriptions  ?Pending Prescriptions Disp Refills  ?? amLODipine (NORVASC) 10 MG tablet 30 tablet 1  ?  Sig: Take 1 tablet (10 mg total) by mouth daily.  ?  ? Cardiovascular: Calcium Channel Blockers 2 Passed - 09/14/2021  5:48 PM  ?  ?  Passed - Last BP in normal range  ?  BP Readings from Last 1 Encounters:  ?09/11/21 112/70  ?   ?  ?  Passed - Last Heart Rate in normal range  ?  Pulse Readings from Last 1 Encounters:  ?09/11/21 74  ?   ?  ?  Passed - Valid encounter within last 6 months  ?  Recent Outpatient Visits   ?      ? 3 weeks ago Essential hypertension  ? [place] Karle Plumber B, MD  ? 5 months ago Migraine without aura and without status migrainosus, not intractable  ? [place] Ladell Pier, MD  ? 6 months ago Essential hypertension  ? [place] Ladell Pier, MD  ? 10 months ago Essential hypertension  ? [place] Ladell Pier, MD  ? 1 year ago Hospital discharge follow-up  ? [place], Vernia Buff, NP  ?  ?  ? ?  ?  ?  ? ? ?

## 2021-09-16 ENCOUNTER — Other Ambulatory Visit (HOSPITAL_COMMUNITY): Payer: Self-pay

## 2021-09-16 ENCOUNTER — Other Ambulatory Visit: Payer: Self-pay | Admitting: Internal Medicine

## 2021-09-16 DIAGNOSIS — R059 Cough, unspecified: Secondary | ICD-10-CM

## 2021-09-16 DIAGNOSIS — Z09 Encounter for follow-up examination after completed treatment for conditions other than malignant neoplasm: Secondary | ICD-10-CM

## 2021-09-16 MED ORDER — ALBUTEROL SULFATE HFA 108 (90 BASE) MCG/ACT IN AERS
2.0000 | INHALATION_SPRAY | RESPIRATORY_TRACT | 2 refills | Status: DC | PRN
  Filled 2021-09-16: qty 18, 25d supply, fill #0
  Filled 2021-12-13: qty 18, 16d supply, fill #1
  Filled 2021-12-14: qty 18, 16d supply, fill #0

## 2021-09-17 ENCOUNTER — Other Ambulatory Visit: Payer: Self-pay

## 2021-09-17 ENCOUNTER — Other Ambulatory Visit (HOSPITAL_COMMUNITY): Payer: Self-pay

## 2021-09-21 ENCOUNTER — Other Ambulatory Visit (HOSPITAL_COMMUNITY): Payer: Self-pay

## 2021-09-28 ENCOUNTER — Other Ambulatory Visit: Payer: Self-pay | Admitting: Internal Medicine

## 2021-09-28 ENCOUNTER — Other Ambulatory Visit: Payer: Self-pay

## 2021-09-28 DIAGNOSIS — I10 Essential (primary) hypertension: Secondary | ICD-10-CM

## 2021-09-28 MED ORDER — CARVEDILOL 25 MG PO TABS
25.0000 mg | ORAL_TABLET | Freq: Two times a day (BID) | ORAL | 3 refills | Status: DC
  Filled 2021-09-28: qty 60, 30d supply, fill #0
  Filled 2021-10-27: qty 60, 30d supply, fill #1
  Filled 2021-11-22 – 2021-11-23 (×2): qty 60, 30d supply, fill #2
  Filled 2021-12-23: qty 60, 30d supply, fill #3

## 2021-09-29 ENCOUNTER — Other Ambulatory Visit: Payer: Self-pay

## 2021-10-07 ENCOUNTER — Other Ambulatory Visit: Payer: Self-pay | Admitting: Internal Medicine

## 2021-10-07 DIAGNOSIS — G43009 Migraine without aura, not intractable, without status migrainosus: Secondary | ICD-10-CM

## 2021-10-08 ENCOUNTER — Other Ambulatory Visit: Payer: Self-pay

## 2021-10-08 ENCOUNTER — Other Ambulatory Visit (HOSPITAL_COMMUNITY): Payer: Self-pay

## 2021-10-08 MED ORDER — TOPIRAMATE 25 MG PO TABS
25.0000 mg | ORAL_TABLET | Freq: Every day | ORAL | 1 refills | Status: DC
  Filled 2021-10-08 (×2): qty 30, 30d supply, fill #0
  Filled 2021-11-08: qty 30, 30d supply, fill #1

## 2021-10-08 NOTE — Telephone Encounter (Signed)
Requested Prescriptions  ?Pending Prescriptions Disp Refills  ?? topiramate (TOPAMAX) 25 MG tablet 30 tablet 1  ?  Sig: Take 1 tablet by mouth at bedtime.  ?  ? Neurology: Anticonvulsants - topiramate & zonisamide Passed - 10/07/2021  6:10 PM  ?  ?  Passed - Cr in normal range and within 360 days  ?  Creatinine, Ser  ?Date Value Ref Range Status  ?08/25/2021 0.79 0.57 - 1.00 mg/dL Final  ?   ?  ?  Passed - CO2 in normal range and within 360 days  ?  CO2  ?Date Value Ref Range Status  ?08/25/2021 22 20 - 29 mmol/L Final  ?   ?  ?  Passed - ALT in normal range and within 360 days  ?  ALT  ?Date Value Ref Range Status  ?08/25/2021 29 0 - 32 IU/L Final  ?   ?  ?  Passed - AST in normal range and within 360 days  ?  AST  ?Date Value Ref Range Status  ?08/25/2021 24 0 - 40 IU/L Final  ?   ?  ?  Passed - Completed PHQ-2 or PHQ-9 in the last 360 days  ?  ?  Passed - Valid encounter within last 12 months  ?  Recent Outpatient Visits   ?      ? 1 month ago Essential hypertension  ? [place] Karle Plumber B, MD  ? 6 months ago Migraine without aura and without status migrainosus, not intractable  ? [place] Ladell Pier, MD  ? 7 months ago Essential hypertension  ? [place] Ladell Pier, MD  ? 11 months ago Essential hypertension  ? [place] Ladell Pier, MD  ? 1 year ago Hospital discharge follow-up  ? [place], Vernia Buff, NP  ?  ?  ? ?  ?  ?  ? ? ?

## 2021-10-19 ENCOUNTER — Other Ambulatory Visit (HOSPITAL_COMMUNITY): Payer: Self-pay

## 2021-10-21 ENCOUNTER — Other Ambulatory Visit: Payer: Self-pay

## 2021-10-21 ENCOUNTER — Other Ambulatory Visit (HOSPITAL_COMMUNITY): Payer: Self-pay

## 2021-10-22 ENCOUNTER — Other Ambulatory Visit: Payer: Self-pay

## 2021-10-23 ENCOUNTER — Encounter: Payer: Self-pay | Admitting: Internal Medicine

## 2021-10-27 ENCOUNTER — Other Ambulatory Visit (HOSPITAL_COMMUNITY): Payer: Self-pay

## 2021-10-28 ENCOUNTER — Other Ambulatory Visit (HOSPITAL_COMMUNITY): Payer: Self-pay

## 2021-11-09 ENCOUNTER — Other Ambulatory Visit (HOSPITAL_COMMUNITY): Payer: Self-pay

## 2021-11-11 ENCOUNTER — Other Ambulatory Visit: Payer: Self-pay | Admitting: Internal Medicine

## 2021-11-11 ENCOUNTER — Other Ambulatory Visit (HOSPITAL_COMMUNITY): Payer: Self-pay

## 2021-11-11 ENCOUNTER — Other Ambulatory Visit: Payer: Self-pay

## 2021-11-11 ENCOUNTER — Encounter: Payer: Self-pay | Admitting: Internal Medicine

## 2021-11-11 DIAGNOSIS — F3342 Major depressive disorder, recurrent, in full remission: Secondary | ICD-10-CM

## 2021-11-11 MED ORDER — VENLAFAXINE HCL ER 150 MG PO CP24
150.0000 mg | ORAL_CAPSULE | Freq: Every day | ORAL | 1 refills | Status: DC
  Filled 2021-11-11: qty 30, 30d supply, fill #0

## 2021-11-23 ENCOUNTER — Other Ambulatory Visit (HOSPITAL_COMMUNITY): Payer: Self-pay

## 2021-11-23 ENCOUNTER — Other Ambulatory Visit: Payer: Self-pay

## 2021-11-26 ENCOUNTER — Encounter: Payer: Self-pay | Admitting: Internal Medicine

## 2021-11-30 ENCOUNTER — Other Ambulatory Visit (HOSPITAL_COMMUNITY): Payer: Self-pay

## 2021-11-30 ENCOUNTER — Other Ambulatory Visit: Payer: Self-pay

## 2021-12-07 ENCOUNTER — Other Ambulatory Visit: Payer: Self-pay

## 2021-12-13 ENCOUNTER — Other Ambulatory Visit: Payer: Self-pay | Admitting: Internal Medicine

## 2021-12-13 ENCOUNTER — Encounter: Payer: Self-pay | Admitting: Internal Medicine

## 2021-12-13 DIAGNOSIS — F3342 Major depressive disorder, recurrent, in full remission: Secondary | ICD-10-CM

## 2021-12-13 DIAGNOSIS — G43009 Migraine without aura, not intractable, without status migrainosus: Secondary | ICD-10-CM

## 2021-12-13 MED ORDER — VENLAFAXINE HCL ER 75 MG PO CP24
75.0000 mg | ORAL_CAPSULE | Freq: Every day | ORAL | 3 refills | Status: DC
  Filled 2021-12-13: qty 90, 90d supply, fill #0
  Filled 2022-03-03: qty 90, 90d supply, fill #1
  Filled 2022-05-16: qty 60, 60d supply, fill #2

## 2021-12-14 ENCOUNTER — Other Ambulatory Visit: Payer: Self-pay | Admitting: Internal Medicine

## 2021-12-14 ENCOUNTER — Other Ambulatory Visit (HOSPITAL_COMMUNITY): Payer: Self-pay

## 2021-12-14 ENCOUNTER — Other Ambulatory Visit: Payer: Self-pay

## 2021-12-14 DIAGNOSIS — G43009 Migraine without aura, not intractable, without status migrainosus: Secondary | ICD-10-CM

## 2021-12-14 MED ORDER — TOPIRAMATE 25 MG PO TABS
25.0000 mg | ORAL_TABLET | Freq: Every day | ORAL | 1 refills | Status: DC
  Filled 2021-12-14: qty 30, 30d supply, fill #0
  Filled 2022-01-14: qty 30, 30d supply, fill #1

## 2021-12-23 ENCOUNTER — Other Ambulatory Visit (HOSPITAL_COMMUNITY): Payer: Self-pay

## 2021-12-24 ENCOUNTER — Other Ambulatory Visit: Payer: Self-pay

## 2021-12-24 ENCOUNTER — Other Ambulatory Visit (HOSPITAL_COMMUNITY): Payer: Self-pay

## 2021-12-25 ENCOUNTER — Encounter: Payer: Self-pay | Admitting: Internal Medicine

## 2022-01-01 ENCOUNTER — Other Ambulatory Visit: Payer: Self-pay

## 2022-01-14 ENCOUNTER — Other Ambulatory Visit (HOSPITAL_COMMUNITY): Payer: Self-pay

## 2022-01-16 ENCOUNTER — Other Ambulatory Visit: Payer: Self-pay | Admitting: Internal Medicine

## 2022-01-18 ENCOUNTER — Other Ambulatory Visit (HOSPITAL_COMMUNITY): Payer: Self-pay

## 2022-01-18 DIAGNOSIS — F419 Anxiety disorder, unspecified: Secondary | ICD-10-CM | POA: Insufficient documentation

## 2022-01-18 MED ORDER — ROSUVASTATIN CALCIUM 20 MG PO TABS
20.0000 mg | ORAL_TABLET | Freq: Every day | ORAL | 3 refills | Status: DC
  Filled 2022-01-18: qty 90, 90d supply, fill #0
  Filled 2022-04-17 (×2): qty 90, 90d supply, fill #1
  Filled 2022-07-19: qty 90, 90d supply, fill #2

## 2022-01-19 ENCOUNTER — Other Ambulatory Visit (HOSPITAL_COMMUNITY): Payer: Self-pay

## 2022-01-19 ENCOUNTER — Other Ambulatory Visit: Payer: Self-pay

## 2022-01-31 ENCOUNTER — Other Ambulatory Visit: Payer: Self-pay | Admitting: Internal Medicine

## 2022-01-31 DIAGNOSIS — I10 Essential (primary) hypertension: Secondary | ICD-10-CM

## 2022-02-02 ENCOUNTER — Other Ambulatory Visit (HOSPITAL_COMMUNITY): Payer: Self-pay

## 2022-02-02 ENCOUNTER — Other Ambulatory Visit: Payer: Self-pay

## 2022-02-02 MED ORDER — CARVEDILOL 25 MG PO TABS
25.0000 mg | ORAL_TABLET | Freq: Two times a day (BID) | ORAL | 2 refills | Status: DC
  Filled 2022-02-02 (×2): qty 60, 30d supply, fill #0
  Filled 2022-03-09: qty 60, 30d supply, fill #1
  Filled 2022-04-11: qty 60, 30d supply, fill #2

## 2022-02-02 NOTE — Telephone Encounter (Signed)
Called pt - [id number] for appointment. 

## 2022-02-02 NOTE — Telephone Encounter (Signed)
Requested Prescriptions  Pending Prescriptions Disp Refills  . carvedilol (COREG) 25 MG tablet 60 tablet 2    Sig: Take 1 tablet (25 mg total) by mouth 2 (two) times daily with a meal.     Cardiovascular: Beta Blockers 3 Passed - 01/31/2022  8:54 AM      Passed - Cr in normal range and within 360 days    Creatinine, Ser  Date Value Ref Range Status  08/25/2021 0.79 0.57 - 1.00 mg/dL Final         Passed - AST in normal range and within 360 days    AST  Date Value Ref Range Status  08/25/2021 24 0 - 40 IU/L Final         Passed - ALT in normal range and within 360 days    ALT  Date Value Ref Range Status  08/25/2021 29 0 - 32 IU/L Final         Passed - Last BP in normal range    BP Readings from Last 1 Encounters:  09/11/21 112/70         Passed - Last Heart Rate in normal range    Pulse Readings from Last 1 Encounters:  09/11/21 74         Passed - Valid encounter within last 6 months    Recent Outpatient Visits          5 months ago Essential hypertension   [place], Neoma Laming B, MD   10 months ago Migraine without aura and without status migrainosus, not intractable   [place] Ladell Pier, MD   11 months ago Essential hypertension   [place], Deborah B, MD   1 year ago Essential hypertension   [place], Deborah B, MD   1 year ago Hospital discharge follow-up   [place], Zelda W, NP

## 2022-02-14 ENCOUNTER — Other Ambulatory Visit: Payer: Self-pay | Admitting: Internal Medicine

## 2022-02-14 DIAGNOSIS — G43009 Migraine without aura, not intractable, without status migrainosus: Secondary | ICD-10-CM

## 2022-02-15 ENCOUNTER — Other Ambulatory Visit (HOSPITAL_COMMUNITY): Payer: Self-pay

## 2022-02-15 MED ORDER — TOPIRAMATE 25 MG PO TABS
25.0000 mg | ORAL_TABLET | Freq: Every day | ORAL | 1 refills | Status: DC
  Filled 2022-02-15: qty 30, 30d supply, fill #0

## 2022-02-15 NOTE — Telephone Encounter (Signed)
Requested Prescriptions  Pending Prescriptions Disp Refills  . topiramate (TOPAMAX) 25 MG tablet 30 tablet 1    Sig: Take 1 tablet by mouth at bedtime.     Neurology: Anticonvulsants - topiramate & zonisamide Passed - 02/14/2022 10:34 AM      Passed - Cr in normal range and within 360 days    Creatinine, Ser  Date Value Ref Range Status  08/25/2021 0.79 0.57 - 1.00 mg/dL Final         Passed - CO2 in normal range and within 360 days    CO2  Date Value Ref Range Status  08/25/2021 22 20 - 29 mmol/L Final         Passed - ALT in normal range and within 360 days    ALT  Date Value Ref Range Status  08/25/2021 29 0 - 32 IU/L Final         Passed - AST in normal range and within 360 days    AST  Date Value Ref Range Status  08/25/2021 24 0 - 40 IU/L Final         Passed - Completed PHQ-2 or PHQ-9 in the last 360 days      Passed - Valid encounter within last 12 months    Recent Outpatient Visits          5 months ago Essential hypertension   [place], Neoma Laming B, MD   10 months ago Migraine without aura and without status migrainosus, not intractable   [place], Deborah B, MD   11 months ago Essential hypertension   [place], Deborah B, MD   1 year ago Essential hypertension   [place], Deborah B, MD   1 year ago Hospital discharge follow-up   [place], Zelda W, NP      Future Appointments            In 1 week Ladell Pier, MD [place]

## 2022-02-16 ENCOUNTER — Other Ambulatory Visit (HOSPITAL_COMMUNITY): Payer: Self-pay

## 2022-02-16 ENCOUNTER — Encounter: Payer: Self-pay | Admitting: Internal Medicine

## 2022-02-26 ENCOUNTER — Encounter: Payer: Self-pay | Admitting: Internal Medicine

## 2022-02-26 ENCOUNTER — Other Ambulatory Visit: Payer: Self-pay

## 2022-02-26 ENCOUNTER — Ambulatory Visit: Payer: Self-pay | Attending: Internal Medicine | Admitting: Internal Medicine

## 2022-02-26 VITALS — BP 160/90 | HR 63 | Temp 98.1°F | Ht 65.0 in | Wt 140.2 lb

## 2022-02-26 DIAGNOSIS — I10 Essential (primary) hypertension: Secondary | ICD-10-CM

## 2022-02-26 DIAGNOSIS — I2510 Atherosclerotic heart disease of native coronary artery without angina pectoris: Secondary | ICD-10-CM

## 2022-02-26 DIAGNOSIS — Z1231 Encounter for screening mammogram for malignant neoplasm of breast: Secondary | ICD-10-CM

## 2022-02-26 DIAGNOSIS — Z87898 Personal history of other specified conditions: Secondary | ICD-10-CM

## 2022-02-26 DIAGNOSIS — R7303 Prediabetes: Secondary | ICD-10-CM

## 2022-02-26 DIAGNOSIS — Z23 Encounter for immunization: Secondary | ICD-10-CM

## 2022-02-26 DIAGNOSIS — G43009 Migraine without aura, not intractable, without status migrainosus: Secondary | ICD-10-CM

## 2022-02-26 MED ORDER — BUTALBITAL-APAP-CAFFEINE 50-325-40 MG PO TABS
1.0000 | ORAL_TABLET | Freq: Two times a day (BID) | ORAL | 1 refills | Status: DC | PRN
  Filled 2022-02-26: qty 20, 5d supply, fill #0
  Filled 2022-03-29 (×2): qty 20, 5d supply, fill #1

## 2022-02-26 MED ORDER — TOPIRAMATE 50 MG PO TABS
25.0000 mg | ORAL_TABLET | Freq: Every day | ORAL | 2 refills | Status: DC
  Filled 2022-02-26: qty 15, 30d supply, fill #0
  Filled 2022-03-20 – 2022-03-24 (×2): qty 15, 30d supply, fill #1
  Filled 2022-06-01: qty 15, 30d supply, fill #2
  Filled 2022-06-14 – 2022-07-09 (×2): qty 15, 30d supply, fill #3
  Filled 2022-08-07: qty 15, 30d supply, fill #4
  Filled 2022-09-15: qty 15, 30d supply, fill #5

## 2022-02-26 MED ORDER — AMLODIPINE BESYLATE 5 MG PO TABS
5.0000 mg | ORAL_TABLET | Freq: Every day | ORAL | 1 refills | Status: DC
  Filled 2022-02-26: qty 90, 90d supply, fill #0
  Filled 2022-05-16: qty 90, 90d supply, fill #1

## 2022-02-26 MED ORDER — TOPIRAMATE 50 MG PO TABS
50.0000 mg | ORAL_TABLET | Freq: Every day | ORAL | 6 refills | Status: DC
  Filled 2022-02-26 – 2022-04-25 (×4): qty 30, 30d supply, fill #0

## 2022-02-26 NOTE — Patient Instructions (Signed)
Increase Topamax to 50 mg at bedtime. We have put you back on Fioricet to use as needed at the onset of the headaches.  This medication can be habit-forming so take only when needed. Your blood pressure is uncontrolled.  I recommend that you restart the amlodipine at 5 mg daily.

## 2022-02-26 NOTE — Progress Notes (Signed)
Patient ID: Monique Hunt, female    DOB: [birth year]  MRN: [id number]  CC: Chronic disease management  Subjective: Monique Hunt is a 61 y.o. female who presents for chronic ds management Her concerns today include:  Patient with history of HTN, CAD one stent to LAD 11/2017, depression, HL, LT thyroid nodule (no further f/u needed per endocrinology Dr. Kelton Hunt), RT lung nodule (stable in size on CT 07/2020)   HTN/CAD/HL: Reports compliance with medication including  Cozaar 100 mg,Crestor 20 mg, carvedilol 25 mg BID and Repatha Q 2 wks .  Isosorbide d/c by cardiology 07/2021.  Suppose to Norvasc 10 mg but not taking; she is not sure what happened to it.  It was not DC'd by cardiology. -took meds already for today No CP/SOB/LE edema Checks BP about once a wk.  Gives range 120s/70s  C/o having HA behind eyes x 3-4 wks ago.  Occurring 4-5x/wk lasting up 1 day.  Worse on RT side Nausea one time. No blurred vision or dizziness, no scalp pain. Mild photophobia No triggers identified. Worse with nose and bright light. Cold cloth over forehead and laying down helps some Tries to take Tylenol and Aleve Q4 hrs but do not work.  She d/c taking 3-4 days ago.   Hx of migraines diagnosed many years ago by her previous PCP.  Diagnosed by me in 03/2021.  We have placed her on Fioricet to use as needed and Topamax 25 mg at bedtime.  She still takes the Topamax.  Diarrhea:  saw Dr. Loletha Hunt.  Stool cx neg and screen for Celiac ds negative.  Started on Dicyclomine.  This has helped but still gets diarrhea.  Not using the Imodium.    PreDM:  down 12 lbs since 07/2021.  This is intentional weight loss.  Eating better and not eating out as much.  Doing low fat diet and walking 5-6 days a wk 3 miles.   ETOH:  Not drinking as much as before.  drinks about two 12  beers 2-3x/wk when watching football. Patient Active Problem List   Diagnosis Date Noted   Anxiety 01/18/2022   Migraine without aura and  without status migrainosus, not intractable 04/06/2021   Prediabetes 11/05/2020   Coronary artery disease involving native coronary artery of native heart with angina pectoris ([place]) 08/06/2020   Low TSH level 08/04/2018   Left thyroid nodule 04/07/2018   Lung nodule, solitary 04/07/2018   Hyperlipidemia LDL goal <70    Old MI (myocardial infarction) 12/15/2017   Essential hypertension 12/01/2017   Depression 06/04/2012   Nephrolithiasis 09/27/2011     Current Outpatient Medications on File Prior to Visit  Medication Sig Dispense Refill   albuterol (VENTOLIN HFA) 108 (90 Base) MCG/ACT inhaler INHALE 2 PUFFS INTO THE LUNGS EVERY 4 (FOUR) HOURS AS NEEDED FOR WHEEZING OR SHORTNESS OF BREATH. 18 g 2   aspirin 81 MG chewable tablet Chew 1 tablet (81 mg total) by mouth daily. 30 tablet 11   carvedilol (COREG) 25 MG tablet Take 1 tablet (25 mg total) by mouth 2 (two) times daily with a meal. 60 tablet 2   dicyclomine (BENTYL) 10 MG capsule Take 1 capsule (10 mg total) by mouth 3 (three) times daily as needed. 90 capsule 3   esomeprazole (NEXIUM) 20 MG capsule Take 20 mg by mouth daily at 12 noon.     Evolocumab (REPATHA SURECLICK) 628 MG/ML SOAJ Inject 1 pen into the skin every 14 days. 2 mL 11  losartan (COZAAR) 100 MG tablet Take 1 tablet (100 mg total) by mouth daily.  Please call to schedule an appt before anymore refills. [phone]. 90 tablet 3   nitroGLYCERIN (NITROSTAT) 0.4 MG SL tablet Place 1 tablet (0.4 mg total) under the tongue every 5 (five) minutes x 3 doses as needed for chest pain. 30 tablet 3   rosuvastatin (CRESTOR) 20 MG tablet Take 1 tablet by mouth daily. 90 tablet 3   venlafaxine XR (EFFEXOR-XR) 75 MG 24 hr capsule Take 1 capsule (75 mg total) by mouth daily with breakfast. 60 capsule 3   diclofenac Sodium (VOLTAREN) 1 % GEL Apply 2 g topically 4 (four) times daily. (Patient not taking: Reported on 02/26/2022) 100 g 1   No current facility-administered medications on file  prior to visit.    No Known Allergies  Social History   Socioeconomic History   Marital status: Divorced    Spouse name: Not on file   Number of children: 2   Years of education: Not on file   Highest education level: Not on file  Occupational History   Not on file  Tobacco Use   Smoking status: Never   Smokeless tobacco: Never  Vaping Use   Vaping Use: Never used  Substance and Sexual Activity   Alcohol use: Yes    Comment: 3 TIMES A WEEK   Drug use: Never   Sexual activity: Yes    Comment: menopausal  Other Topics Concern   Not on file  Social History Narrative   Not on file   Social Determinants of Health   Financial Resource Strain: Not on file  Food Insecurity: Not on file  Transportation Needs: Not on file  Physical Activity: Not on file  Stress: Not on file  Social Connections: Not on file  Intimate Partner Violence: Not on file    Family History  Problem Relation Age of Onset   CAD Father    Heart disease Father    Breast cancer Mother    Hyperlipidemia Sister    Hyperlipidemia Brother    Colon cancer Neg Hx    Esophageal cancer Neg Hx    Rectal cancer Neg Hx    Stomach cancer Neg Hx     Past Surgical History:  Procedure Laterality Date   CORONARY STENT INTERVENTION N/A 12/19/2017   Procedure: CORONARY STENT INTERVENTION;  Surgeon: Belva Crome, MD;  Location: [facility];  Service: Cardiovascular;  Laterality: N/A;   CYSTOSCOPY W/ STONE MANIPULATION     FACIAL FRACTURE SURGERY  1979   LEFT HEART CATH AND CORONARY ANGIOGRAPHY N/A 12/16/2017   Procedure: LEFT HEART CATH AND CORONARY ANGIOGRAPHY;  Surgeon: Belva Crome, MD;  Location: [facility];  Service: Cardiovascular;  Laterality: N/A;   LEFT HEART CATH AND CORONARY ANGIOGRAPHY N/A 12/19/2019   Procedure: LEFT HEART CATH AND CORONARY ANGIOGRAPHY;  Surgeon: Lorretta Harp, MD;  Location: [facility];  Service: Cardiovascular;  Laterality: N/A;   LITHOTRIPSY     SKIN  SURGERY Right    "birthmark removed under my arm when I was little"    ROS: Review of Systems Negative except as stated above  PHYSICAL EXAM: BP (!) 160/90   Pulse 63   Temp 98.1 F (36.7 C) (Oral)   Ht '5\' 5"'$  (1.651 m)   Wt 140 lb 3.2 oz (63.6 kg)   LMP 02/28/2011   SpO2 99%   BMI 23.33 kg/m   Wt Readings from Last  3 Encounters:  02/26/22 140 lb 3.2 oz (63.6 kg)  09/11/21 155 lb 3.2 oz (70.4 kg)  08/25/21 152 lb (68.9 kg)    Physical Exam  General appearance - alert, well appearing, older Caucasian female and in no distress Mental status - normal mood, behavior, speech, dress, motor activity, and thought processes Neck - supple, no significant adenopathy Chest - clear to auscultation, no wheezes, rales or rhonchi, symmetric air entry Heart - normal rate, regular rhythm, normal S1, S2, no murmurs, rubs, clicks or gallops Extremities - peripheral pulses normal, no pedal edema, no clubbing or cyanosis Neuro: Cranial nerves grossly intact.  Power 5/5 proximally and distally throughout.  Gross sensation intact.  Gait is normal.  Romberg negative.     Latest Ref Rng & Units 08/25/2021    2:43 PM 11/04/2020    4:43 PM 09/22/2020   10:44 AM  CMP  Glucose 70 - 99 mg/dL 112  92    BUN 8 - 27 mg/dL 17  25    Creatinine 0.57 - 1.00 mg/dL 0.79  0.79    Sodium 134 - 144 mmol/L 137  138    Potassium 3.5 - 5.2 mmol/L 5.1  4.3    Chloride 96 - 106 mmol/L 103  101    CO2 20 - 29 mmol/L 22  22    Calcium 8.7 - 10.3 mg/dL 10.4  10.0    Total Protein 6.0 - 8.5 g/dL 7.9   7.2   Total Bilirubin 0.0 - 1.2 mg/dL 0.4   0.2   Alkaline Phos 44 - 121 IU/L 117   110   AST 0 - 40 IU/L 24   20   ALT 0 - 32 IU/L 29   23    Lipid Panel     Component Value Date/Time   CHOL 90 (L) 01/06/2021 0901   TRIG 219 (H) 01/06/2021 0901   HDL 55 01/06/2021 0901   CHOLHDL 1.6 01/06/2021 0901   CHOLHDL 7.2 12/16/2017 0714   VLDL 79 (H) 12/16/2017 0714   LDLCALC 3 01/06/2021 0901    CBC    Component  Value Date/Time   WBC 5.4 08/25/2021 1443   WBC 7.7 08/18/2020 1703   RBC 4.45 08/25/2021 1443   RBC 4.85 08/18/2020 1703   HGB 13.5 08/25/2021 1443   HCT 40.8 08/25/2021 1443   PLT 416 08/25/2021 1443   MCV 92 08/25/2021 1443   MCH 30.3 08/25/2021 1443   MCH 30.3 08/18/2020 1703   MCHC 33.1 08/25/2021 1443   MCHC 33.0 08/18/2020 1703   RDW 12.1 08/25/2021 1443   LYMPHSABS 1.3 05/10/2018 1218   MONOABS 0.6 02/10/2018 1741   EOSABS 0.1 05/10/2018 1218   BASOSABS 0.1 05/10/2018 1218    ASSESSMENT AND PLAN: 1. Essential hypertension Not at goal.  I recommend restarting amlodipine.  She was previously on 10 mg but I will restart at 5 mg.  Continue carvedilol 25 mg twice a day and Cozaar 100 mg daily - amLODipine (NORVASC) 5 MG tablet; Take 1 tablet (5 mg total) by mouth daily.  Dispense: 90 tablet; Refill: 1  2. Migraine without aura and without status migrainosus, not intractable We will increase Topamax to 50 mg at bedtime. Refill given on Fioricet which she had responded well to in the past.  Advised that it is a controlled substance and can become habit-forming.  Advised to take only when needed at the start of the headache.  Not a good candidate for Imitrex given history  of heart disease. - butalbital-acetaminophen-caffeine (FIORICET) 50-325-40 MG tablet; Take 1-2 tablets by mouth 2 (two) times daily as needed for headache.  Dispense: 20 tablet; Refill: 1 - topiramate (TOPAMAX) 50 MG tablet; Take 0.5 tablets (25 mg total) by mouth at bedtime.  Dispense: 30 tablet; Refill: 2 - CT HEAD WO CONTRAST (5MM); Future  3. Coronary artery disease involving native coronary artery of native heart without angina pectoris Continue carvedilol, aspirin, Crestor and Repatha.  4. History of alcohol use disorder Commended her on cutting back.  However advised no more than one 12 oz beer a day or per setting.  5. Prediabetes Commended her on weight loss.  Encouraged her to continue healthy eating  habits and regular exercise.  6. Encounter for screening mammogram for malignant neoplasm of breast Given mammogram scholarship. - MM Digital Screening; Future  7. Need for immunization against influenza - Flu Vaccine QUAD 41moIM (Fluarix, Fluzone & Alfiuria Quad PF)    Patient was given the opportunity to ask questions.  Patient verbalized understanding of the plan and was able to repeat key elements of the plan.   This documentation was completed using DRadio producer  Any transcriptional errors are unintentional.  Orders Placed This Encounter  Procedures   CT HEAD WO CONTRAST (5MM)   MM Digital Screening   Flu Vaccine QUAD 627moM (Fluarix, Fluzone & Alfiuria Quad PF)     Requested Prescriptions   Signed Prescriptions Disp Refills   butalbital-acetaminophen-caffeine (FIORICET) 50-325-40 MG tablet 20 tablet 1    Sig: Take 1-2 tablets by mouth 2 (two) times daily as needed for headache.   topiramate (TOPAMAX) 50 MG tablet 30 tablet 2    Sig: Take 0.5 tablets (25 mg total) by mouth at bedtime.   amLODipine (NORVASC) 5 MG tablet 90 tablet 1    Sig: Take 1 tablet (5 mg total) by mouth daily.    Return in about 6 weeks (around 04/09/2022) for Headache and HTN.  DeKarle PlumberMD, FACP

## 2022-02-26 NOTE — Progress Notes (Signed)
Has been having headaches behind her eyes. Needs refill on medications.

## 2022-03-02 ENCOUNTER — Encounter: Payer: Self-pay | Admitting: Internal Medicine

## 2022-03-03 ENCOUNTER — Other Ambulatory Visit: Payer: Self-pay

## 2022-03-04 ENCOUNTER — Ambulatory Visit (HOSPITAL_COMMUNITY): Payer: Medicaid Other

## 2022-03-09 ENCOUNTER — Other Ambulatory Visit: Payer: Self-pay

## 2022-03-17 ENCOUNTER — Other Ambulatory Visit: Payer: Self-pay

## 2022-03-17 DIAGNOSIS — Z1231 Encounter for screening mammogram for malignant neoplasm of breast: Secondary | ICD-10-CM

## 2022-03-20 ENCOUNTER — Other Ambulatory Visit (HOSPITAL_COMMUNITY): Payer: Self-pay

## 2022-03-24 ENCOUNTER — Other Ambulatory Visit: Payer: Self-pay

## 2022-03-24 ENCOUNTER — Other Ambulatory Visit (HOSPITAL_COMMUNITY): Payer: Self-pay

## 2022-03-25 ENCOUNTER — Other Ambulatory Visit (HOSPITAL_COMMUNITY): Payer: Self-pay

## 2022-03-25 ENCOUNTER — Other Ambulatory Visit: Payer: Self-pay

## 2022-03-29 ENCOUNTER — Other Ambulatory Visit: Payer: Self-pay

## 2022-03-29 ENCOUNTER — Other Ambulatory Visit (HOSPITAL_COMMUNITY): Payer: Self-pay

## 2022-03-29 ENCOUNTER — Other Ambulatory Visit (HOSPITAL_BASED_OUTPATIENT_CLINIC_OR_DEPARTMENT_OTHER): Payer: Self-pay

## 2022-04-08 ENCOUNTER — Other Ambulatory Visit (HOSPITAL_COMMUNITY): Payer: Self-pay

## 2022-04-09 ENCOUNTER — Ambulatory Visit: Payer: Medicaid Other | Admitting: Internal Medicine

## 2022-04-12 ENCOUNTER — Other Ambulatory Visit (HOSPITAL_COMMUNITY): Payer: Self-pay

## 2022-04-13 ENCOUNTER — Inpatient Hospital Stay: Admission: RE | Admit: 2022-04-13 | Payer: No Typology Code available for payment source | Source: Ambulatory Visit

## 2022-04-13 ENCOUNTER — Ambulatory Visit: Payer: Medicaid Other

## 2022-04-16 ENCOUNTER — Telehealth: Payer: Self-pay | Admitting: Internal Medicine

## 2022-04-16 NOTE — Telephone Encounter (Signed)
Pt c/o of Chest Pain: STAT if CP now or developed within 24 hours  1. Are you having CP right now? No   2. Are you experiencing any other symptoms (ex. SOB, nausea, vomiting, sweating)? Fatigue   3. How long have you been experiencing CP? Couple of weeks  4. Is your CP continuous or coming and going? Coming and going   5. Have you taken Nitroglycerin? Yes   Pt states it is more so back pain and it has been going on for weeks now similar to her symptoms she had a few years ago. She is not in any pain at the moment, pt made an appt for 04/21/22 with Kathlen Mody, PA at 10:30am  ?

## 2022-04-16 NOTE — Telephone Encounter (Signed)
Left message for patient to call back  

## 2022-04-17 ENCOUNTER — Other Ambulatory Visit (HOSPITAL_COMMUNITY): Payer: Self-pay

## 2022-04-17 ENCOUNTER — Other Ambulatory Visit: Payer: Self-pay

## 2022-04-19 ENCOUNTER — Other Ambulatory Visit: Payer: Self-pay

## 2022-04-21 ENCOUNTER — Ambulatory Visit: Payer: No Typology Code available for payment source | Admitting: Physician Assistant

## 2022-04-21 NOTE — Telephone Encounter (Signed)
Patient had appointment for today with Richardson Dopp, it appears appointment was canceled on 04/19/22. Appointment notes state it was canceled due to back pain. Called patient to check on symptoms/needs, no answer. Left message for patient to call back.

## 2022-04-26 ENCOUNTER — Other Ambulatory Visit (HOSPITAL_COMMUNITY): Payer: Self-pay

## 2022-04-27 ENCOUNTER — Other Ambulatory Visit: Payer: Self-pay

## 2022-04-30 ENCOUNTER — Other Ambulatory Visit: Payer: Self-pay

## 2022-05-09 ENCOUNTER — Other Ambulatory Visit: Payer: Self-pay | Admitting: Internal Medicine

## 2022-05-09 DIAGNOSIS — I10 Essential (primary) hypertension: Secondary | ICD-10-CM

## 2022-05-10 ENCOUNTER — Other Ambulatory Visit (HOSPITAL_COMMUNITY): Payer: Self-pay

## 2022-05-10 MED ORDER — CARVEDILOL 25 MG PO TABS
25.0000 mg | ORAL_TABLET | Freq: Two times a day (BID) | ORAL | 0 refills | Status: DC
  Filled 2022-05-10: qty 60, 30d supply, fill #0

## 2022-05-17 ENCOUNTER — Other Ambulatory Visit: Payer: Self-pay

## 2022-06-01 ENCOUNTER — Other Ambulatory Visit: Payer: Self-pay

## 2022-06-09 ENCOUNTER — Other Ambulatory Visit (HOSPITAL_COMMUNITY): Payer: Self-pay

## 2022-06-14 ENCOUNTER — Other Ambulatory Visit (HOSPITAL_COMMUNITY): Payer: Self-pay

## 2022-06-20 ENCOUNTER — Other Ambulatory Visit: Payer: Self-pay | Admitting: Internal Medicine

## 2022-06-20 DIAGNOSIS — I10 Essential (primary) hypertension: Secondary | ICD-10-CM

## 2022-06-21 NOTE — Telephone Encounter (Signed)
Requested medication (s) are due for refill today: yes  Requested medication (s) are on the active medication list: yes  Last refill:  05/10/22  Future visit scheduled: no  Notes to clinic:  Unable to refill per protocol, courtesy refill already given, routing for provider approval.      Requested Prescriptions  Pending Prescriptions Disp Refills   carvedilol (COREG) 25 MG tablet 60 tablet 0    Sig: Take 1 tablet (25 mg total) by mouth 2 (two) times daily with a meal. Must have office visit for refills.     Cardiovascular: Beta Blockers 3 Failed - 06/20/2022  1:48 PM      Failed - Last BP in normal range    BP Readings from Last 1 Encounters:  02/26/22 (!) 160/90         Passed - Cr in normal range and within 360 days    Creatinine, Ser  Date Value Ref Range Status  08/25/2021 0.79 0.57 - 1.00 mg/dL Final         Passed - AST in normal range and within 360 days    AST  Date Value Ref Range Status  08/25/2021 24 0 - 40 IU/L Final         Passed - ALT in normal range and within 360 days    ALT  Date Value Ref Range Status  08/25/2021 29 0 - 32 IU/L Final         Passed - Last Heart Rate in normal range    Pulse Readings from Last 1 Encounters:  02/26/22 63         Passed - Valid encounter within last 6 months    Recent Outpatient Visits           3 months ago Essential hypertension   [place] Ladell Pier, MD   10 months ago Essential hypertension   [place] Karle Plumber B, MD   1 year ago Migraine without aura and without status migrainosus, not intractable   [place] Ladell Pier, MD   1 year ago Essential hypertension   [place] Ladell Pier, MD   1 year ago Essential hypertension   [place] Ladell Pier, MD

## 2022-06-22 ENCOUNTER — Other Ambulatory Visit (HOSPITAL_COMMUNITY): Payer: Self-pay

## 2022-06-22 MED ORDER — CARVEDILOL 25 MG PO TABS
25.0000 mg | ORAL_TABLET | Freq: Two times a day (BID) | ORAL | 3 refills | Status: DC
  Filled 2022-06-22: qty 60, 30d supply, fill #0
  Filled 2022-07-26 – 2022-07-28 (×2): qty 60, 30d supply, fill #1
  Filled 2022-08-07 – 2022-08-27 (×2): qty 60, 30d supply, fill #2
  Filled 2022-09-29: qty 60, 30d supply, fill #3

## 2022-06-23 ENCOUNTER — Other Ambulatory Visit: Payer: Self-pay

## 2022-06-28 ENCOUNTER — Encounter: Payer: Self-pay | Admitting: Internal Medicine

## 2022-06-28 ENCOUNTER — Other Ambulatory Visit: Payer: Self-pay | Admitting: Internal Medicine

## 2022-06-28 DIAGNOSIS — Z1231 Encounter for screening mammogram for malignant neoplasm of breast: Secondary | ICD-10-CM

## 2022-07-09 ENCOUNTER — Other Ambulatory Visit (HOSPITAL_COMMUNITY): Payer: Self-pay

## 2022-07-11 ENCOUNTER — Other Ambulatory Visit: Payer: Self-pay | Admitting: Internal Medicine

## 2022-07-11 ENCOUNTER — Other Ambulatory Visit: Payer: Self-pay | Admitting: Gastroenterology

## 2022-07-11 DIAGNOSIS — F3342 Major depressive disorder, recurrent, in full remission: Secondary | ICD-10-CM

## 2022-07-12 ENCOUNTER — Other Ambulatory Visit (HOSPITAL_COMMUNITY): Payer: Self-pay

## 2022-07-12 ENCOUNTER — Other Ambulatory Visit: Payer: Self-pay

## 2022-07-12 MED ORDER — DICYCLOMINE HCL 10 MG PO CAPS
10.0000 mg | ORAL_CAPSULE | Freq: Three times a day (TID) | ORAL | 0 refills | Status: DC | PRN
  Filled 2022-07-12: qty 90, 30d supply, fill #0

## 2022-07-12 MED ORDER — VENLAFAXINE HCL ER 75 MG PO CP24
75.0000 mg | ORAL_CAPSULE | Freq: Every day | ORAL | 0 refills | Status: DC
  Filled 2022-07-12: qty 30, 30d supply, fill #0

## 2022-07-12 NOTE — Telephone Encounter (Signed)
Please schedule a yearly follow up for continued medication management

## 2022-07-19 ENCOUNTER — Other Ambulatory Visit: Payer: Self-pay

## 2022-07-19 ENCOUNTER — Other Ambulatory Visit: Payer: Self-pay | Admitting: Internal Medicine

## 2022-07-19 DIAGNOSIS — F3342 Major depressive disorder, recurrent, in full remission: Secondary | ICD-10-CM

## 2022-07-26 ENCOUNTER — Other Ambulatory Visit (HOSPITAL_COMMUNITY): Payer: Self-pay

## 2022-07-28 ENCOUNTER — Other Ambulatory Visit: Payer: Self-pay

## 2022-08-02 ENCOUNTER — Other Ambulatory Visit (HOSPITAL_COMMUNITY): Payer: Self-pay

## 2022-08-09 ENCOUNTER — Other Ambulatory Visit: Payer: Self-pay

## 2022-08-09 ENCOUNTER — Other Ambulatory Visit (HOSPITAL_COMMUNITY): Payer: Self-pay

## 2022-08-13 ENCOUNTER — Encounter: Payer: Self-pay | Admitting: Internal Medicine

## 2022-08-19 ENCOUNTER — Ambulatory Visit: Payer: Medicaid Other | Attending: Physician Assistant

## 2022-08-19 DIAGNOSIS — I25119 Atherosclerotic heart disease of native coronary artery with unspecified angina pectoris: Secondary | ICD-10-CM | POA: Diagnosis not present

## 2022-08-19 DIAGNOSIS — E785 Hyperlipidemia, unspecified: Secondary | ICD-10-CM | POA: Diagnosis not present

## 2022-08-19 DIAGNOSIS — I10 Essential (primary) hypertension: Secondary | ICD-10-CM | POA: Diagnosis not present

## 2022-08-20 LAB — COMPREHENSIVE METABOLIC PANEL
ALT: 41 IU/L — ABNORMAL HIGH (ref 0–32)
AST: 28 IU/L (ref 0–40)
Albumin/Globulin Ratio: 1.6 (ref 1.2–2.2)
Albumin: 4.6 g/dL (ref 3.9–4.9)
Alkaline Phosphatase: 90 IU/L (ref 44–121)
BUN/Creatinine Ratio: 35 — ABNORMAL HIGH (ref 12–28)
BUN: 27 mg/dL (ref 8–27)
Bilirubin Total: 0.2 mg/dL (ref 0.0–1.2)
CO2: 23 mmol/L (ref 20–29)
Calcium: 10 mg/dL (ref 8.7–10.3)
Chloride: 104 mmol/L (ref 96–106)
Creatinine, Ser: 0.78 mg/dL (ref 0.57–1.00)
Globulin, Total: 2.8 g/dL (ref 1.5–4.5)
Glucose: 107 mg/dL — ABNORMAL HIGH (ref 70–99)
Potassium: 4.7 mmol/L (ref 3.5–5.2)
Sodium: 143 mmol/L (ref 134–144)
Total Protein: 7.4 g/dL (ref 6.0–8.5)
eGFR: 86 mL/min/{1.73_m2} (ref >59)

## 2022-08-20 LAB — LIPID PANEL
Chol/HDL Ratio: 1.5 ratio (ref 0.0–4.4)
Cholesterol, Total: 108 mg/dL (ref 100–199)
HDL: 72 mg/dL (ref >39)
LDL Chol Calc (NIH): 8 mg/dL (ref 0–99)
Triglycerides: 184 mg/dL — ABNORMAL HIGH (ref 0–149)
VLDL Cholesterol Cal: 28 mg/dL (ref 5–40)

## 2022-08-21 ENCOUNTER — Other Ambulatory Visit: Payer: Self-pay | Admitting: Internal Medicine

## 2022-08-21 DIAGNOSIS — F3342 Major depressive disorder, recurrent, in full remission: Secondary | ICD-10-CM

## 2022-08-21 DIAGNOSIS — I10 Essential (primary) hypertension: Secondary | ICD-10-CM

## 2022-08-23 ENCOUNTER — Other Ambulatory Visit (HOSPITAL_COMMUNITY): Payer: Self-pay

## 2022-08-23 ENCOUNTER — Other Ambulatory Visit: Payer: Self-pay

## 2022-08-23 MED ORDER — VENLAFAXINE HCL ER 75 MG PO CP24
75.0000 mg | ORAL_CAPSULE | Freq: Every day | ORAL | 0 refills | Status: DC
  Filled 2022-08-23: qty 30, 30d supply, fill #0

## 2022-08-23 MED ORDER — AMLODIPINE BESYLATE 5 MG PO TABS
5.0000 mg | ORAL_TABLET | Freq: Every day | ORAL | 0 refills | Status: DC
  Filled 2022-08-23: qty 30, 30d supply, fill #0

## 2022-08-23 NOTE — Telephone Encounter (Signed)
Patient will need an office visit for further refills. Requested Prescriptions  Pending Prescriptions Disp Refills   venlafaxine XR (EFFEXOR-XR) 75 MG 24 hr capsule 30 capsule 0    Sig: Take 1 capsule (75 mg total) by mouth daily with breakfast.     Psychiatry: Antidepressants - SNRI - desvenlafaxine & venlafaxine Failed - 08/21/2022  9:34 AM      Failed - Last BP in normal range    BP Readings from Last 1 Encounters:  02/26/22 (!) 160/90         Failed - Lipid Panel in normal range within the last 12 months    Cholesterol, Total  Date Value Ref Range Status  08/19/2022 108 100 - 199 mg/dL Final   LDL Chol Calc (NIH)  Date Value Ref Range Status  08/19/2022 8 0 - 99 mg/dL Final   HDL  Date Value Ref Range Status  08/19/2022 72 >39 mg/dL Final   Triglycerides  Date Value Ref Range Status  08/19/2022 184 (H) 0 - 149 mg/dL Final         Passed - Cr in normal range and within 360 days    Creatinine, Ser  Date Value Ref Range Status  08/19/2022 0.78 0.57 - 1.00 mg/dL Final         Passed - Completed PHQ-2 or PHQ-9 in the last 360 days      Passed - Valid encounter within last 6 months    Recent Outpatient Visits           5 months ago Essential hypertension   [place], MD   12 months ago Essential hypertension   [place], Deborah B, MD   1 year ago Migraine without aura and without status migrainosus, not intractable   [place], Deborah B, MD   1 year ago Essential hypertension   [place], Deborah B, MD   1 year ago Essential hypertension   [place], MD       Future Appointments             In 3 days Fay Records, MD [place] [facility]             amLODipine (NORVASC) 5 MG  tablet 30 tablet 0    Sig: Take 1 tablet (5 mg total) by mouth daily.     Cardiovascular: Calcium Channel Blockers 2 Failed - 08/21/2022  9:34 AM      Failed - Last BP in normal range    BP Readings from Last 1 Encounters:  02/26/22 (!) 160/90         Passed - Last Heart Rate in normal range    Pulse Readings from Last 1 Encounters:  02/26/22 63         Passed - Valid encounter within last 6 months    Recent Outpatient Visits           5 months ago Essential hypertension   [place], MD   12 months ago Essential hypertension   [place], MD   1 year ago Migraine without aura and without status migrainosus, not intractable   [place], Neoma Laming  B, MD   1 year ago Essential hypertension   [place], MD   1 year ago Essential hypertension   [place], MD       Future Appointments             In 3 days Fay Records, MD [place] at [facility], [place]

## 2022-08-26 ENCOUNTER — Ambulatory Visit: Payer: Medicaid Other | Admitting: Internal Medicine

## 2022-08-27 ENCOUNTER — Other Ambulatory Visit (HOSPITAL_COMMUNITY): Payer: Self-pay

## 2022-08-30 ENCOUNTER — Other Ambulatory Visit: Payer: Self-pay

## 2022-08-30 ENCOUNTER — Other Ambulatory Visit: Payer: Self-pay | Admitting: Pharmacist

## 2022-08-30 ENCOUNTER — Ambulatory Visit: Payer: Medicaid Other | Attending: Internal Medicine | Admitting: Internal Medicine

## 2022-08-30 ENCOUNTER — Encounter: Payer: Self-pay | Admitting: Internal Medicine

## 2022-08-30 VITALS — BP 106/70 | HR 79 | Ht 65.5 in | Wt 155.0 lb

## 2022-08-30 DIAGNOSIS — I25119 Atherosclerotic heart disease of native coronary artery with unspecified angina pectoris: Secondary | ICD-10-CM | POA: Diagnosis not present

## 2022-08-30 DIAGNOSIS — I10 Essential (primary) hypertension: Secondary | ICD-10-CM | POA: Diagnosis not present

## 2022-08-30 LAB — CBC WITH DIFFERENTIAL/PLATELET
Basophils Absolute: 0.1 10*3/uL (ref 0.0–0.2)
Basos: 1 %
EOS (ABSOLUTE): 0.2 10*3/uL (ref 0.0–0.4)
Eos: 4 %
Hematocrit: 39 % (ref 34.0–46.6)
Hemoglobin: 13.1 g/dL (ref 11.1–15.9)
Lymphocytes Absolute: 1 10*3/uL (ref 0.7–3.1)
Lymphs: 19 %
MCH: 32.3 pg (ref 26.6–33.0)
MCHC: 33.6 g/dL (ref 31.5–35.7)
MCV: 96 fL (ref 79–97)
Monocytes Absolute: 0.6 10*3/uL (ref 0.1–0.9)
Monocytes: 11 %
Neutrophils Absolute: 3.5 10*3/uL (ref 1.4–7.0)
Neutrophils: 65 %
Platelets: 296 10*3/uL (ref 150–450)
RBC: 4.06 x10E6/uL (ref 3.77–5.28)
RDW: 13.7 % (ref 11.7–15.4)
WBC: 5.4 10*3/uL (ref 3.4–10.8)

## 2022-08-30 MED ORDER — REPATHA SURECLICK 140 MG/ML ~~LOC~~ SOAJ
SUBCUTANEOUS | 11 refills | Status: DC
  Filled 2022-08-30: qty 2, 28d supply, fill #0
  Filled 2022-09-15 – 2022-09-20 (×2): qty 2, 28d supply, fill #1
  Filled 2022-10-31: qty 2, 28d supply, fill #2
  Filled 2022-11-30: qty 2, 28d supply, fill #3
  Filled 2022-12-30: qty 2, 28d supply, fill #4
  Filled 2023-01-27: qty 2, 28d supply, fill #5
  Filled 2023-02-24: qty 2, 28d supply, fill #6
  Filled 2023-03-24: qty 2, 28d supply, fill #7
  Filled 2023-04-21: qty 2, 28d supply, fill #8
  Filled 2023-05-14: qty 2, 28d supply, fill #9
  Filled 2023-06-11 – 2023-07-16 (×2): qty 2, 28d supply, fill #10
  Filled 2023-08-10: qty 2, 28d supply, fill #11

## 2022-08-30 NOTE — Progress Notes (Unsigned)
Cardiology Office Note:    Date:  08/30/2022   ID:  Monique Hunt, DOB August 13, 1960, MRN [id number]  PCP:  Ladell Pier, MD  [place] [facility] Cardiologist:  Dorris Carnes, MD Cardiology APP:  Sharmon Revere     Referring MD: Ladell Pier, MD   Chief Complaint:  No chief complaint on file.    Patient Profile: Coronary artery disease w/ angina S/p NSTEMI 7/19 >> PCI: DES to pLAD ETT-Echocardiogram 07/2018: no ischemia Cath 01-10-23:  oLAD 40, mLAD stent patent Rx with Isosorbide  Hypertension  Hyperlipidemia  Aortic atherosclerosis     Prior CV studies: Cardiac catheterization 10-Jan-2020 LAD ostial 40, mid stent patent EF 55-65   Stress echocardiogram 07/10/2018 No echocardiographic evidence for stress-induced ischemia No LV outflow tract obstruction at baseline or after exercise   Cardiac catheterization 12/16/2017 LAD proximal 75 EF 55-65 12/19/2017: PCI -3.5 x 12 mm Synergy DES to the LAD  History of Present Illness:   Monique Hunt is a 62 y.o. female with the above problem list.  She was last seen in Dec 2021.  She returns for f/u.  She is here alone.  She is doing well without chest pain, shortness of breath, syncope, orthopnea, leg edema. She still works at [employer]. She has to do a lot of walking there. She has started to diet and has lost 20 lbs since Nov 22.     She was last seen by Kathleen Argue in March 2023        Patient says she has felt good until a couple months ago    Had been walking   Lost weight  NOw tired   Has stomach aches    Chore to take shower this morning   CHore to do meal prep   Gets out of breath     Problmes with stomach  Diarrhea every day Liquid   Will go 4 to 8 x per day   Pt denies CP   Did have pain in her L arm    Past Medical History:  Diagnosis Date  . Coronary artery disease   . Depression   . History of kidney stones   . Hyperlipidemia   . Hypertension   . Migraine    "stopped ~ 2010" (12/15/2017)  .  NSTEMI (non-ST elevated myocardial infarction) 12/15/2017   Current Medications: Current Meds  Medication Sig  . albuterol (VENTOLIN HFA) 108 (90 Base) MCG/ACT inhaler INHALE 2 PUFFS INTO THE LUNGS EVERY 4 (FOUR) HOURS AS NEEDED FOR WHEEZING OR SHORTNESS OF BREATH.  Marland Kitchen amLODipine (NORVASC) 5 MG tablet Take 1 tablet (5 mg total) by mouth daily.  Marland Kitchen aspirin 81 MG chewable tablet Chew 1 tablet (81 mg total) by mouth daily.  Marland Kitchen atorvastatin (LIPITOR) 80 MG tablet 80 mg daily.  . carvedilol (COREG) 25 MG tablet Take 1 tablet (25 mg total) by mouth 2 (two) times daily with a meal. Must have office visit for refills.  Marland Kitchen esomeprazole (NEXIUM) 20 MG capsule Take 20 mg by mouth daily at 12 noon.  . Evolocumab (REPATHA SURECLICK) XX123456 MG/ML SOAJ Inject 1 pen into the skin every 14 days.  Marland Kitchen losartan (COZAAR) 100 MG tablet Take 1 tablet (100 mg total) by mouth daily.  Please call to schedule an appt before anymore refills. [phone].  . nitroGLYCERIN (NITROSTAT) 0.4 MG SL tablet Place 1 tablet (0.4 mg total) under the tongue every 5 (five) minutes x 3 doses as needed for chest pain.  Marland Kitchen  rosuvastatin (CRESTOR) 20 MG tablet Take 1 tablet by mouth daily.  Marland Kitchen topiramate (TOPAMAX) 50 MG tablet Take 0.5 tablets (25 mg total) by mouth at bedtime.  Marland Kitchen venlafaxine XR (EFFEXOR-XR) 75 MG 24 hr capsule Take 1 capsule (75 mg total) by mouth daily with breakfast.    Allergies:   Patient has no known allergies.   Social History   Tobacco Use  . Smoking status: Never  . Smokeless tobacco: Never  Vaping Use  . Vaping Use: Never used  Substance Use Topics  . Alcohol use: Yes    Comment: 3 TIMES A WEEK  . Drug use: Never    Family Hx: The patient's family history includes Breast cancer in her mother; CAD in her father; Heart disease in her father; Hyperlipidemia in her brother and sister. There is no history of Colon cancer, Esophageal cancer, Rectal cancer, or Stomach cancer.  Review of Systems  Cardiovascular:   Negative for claudication.     EKGs/Labs/Other Test Reviewed:    EKG:  EKG is   ordered today.  SR 80 bpm   LVH  Q wave in III   Recent Labs: 08/19/2022: ALT 41; BUN 27; Creatinine, Ser 0.78; Potassium 4.7; Sodium 143   Recent Lipid Panel Recent Labs    08/19/22 0850  CHOL 108  TRIG 184*  HDL 72  LDLCALC 8      Risk Assessment/Calculations:         Physical Exam:    VS:  BP 106/70   Pulse 79   Ht 5' 5.5" (1.664 m)   Wt 155 lb (70.3 kg)   LMP 02/28/2011   SpO2 96%   BMI 25.40 kg/m     Wt Readings from Last 3 Encounters:  08/30/22 155 lb (70.3 kg)  02/26/22 140 lb 3.2 oz (63.6 kg)  09/11/21 155 lb 3.2 oz (70.4 kg)    Constitutional:      Appearance: Healthy appearance. Not in distress.  Neck:     Vascular: No carotid bruit or JVR. JVD normal.  Pulmonary:     Effort: Pulmonary effort is normal.     Breath sounds: No wheezing. No rales.  Cardiovascular:     Normal rate. Regular rhythm. Normal S1. Normal S2.      Murmurs: There is no murmur.  Edema:    Peripheral edema absent.  Abdominal:     Palpations: Abdomen is soft. There is no hepatomegaly.  Skin:    General: Skin is warm and dry.  Neurological:     Mental Status: Alert and oriented to person, place and time.        ASSESSMENT & PLAN:   No problem-specific Assessment & Plan notes found for this encounter.          Dispo:  No follow-ups on file.   Medication Adjustments/Labs and Tests Ordered: Current medicines are reviewed at length with the patient today.  Concerns regarding medicines are outlined above.  Tests Ordered: No orders of the defined types were placed in this encounter.  Medication Changes: No orders of the defined types were placed in this encounter.  Signed, Dorris Carnes, MD  08/30/2022 10:09 AM    [address], [place], [place]  [address] Phone: [phone]; Fax: [phone]

## 2022-08-30 NOTE — Patient Instructions (Addendum)
Medication Instructions:  Your physician has recommended you make the following change in your medication:  1-Stop amlodipine  *If you need a refill on your cardiac medications before your next appointment, please call your pharmacy*  Lab Work: Your physician recommends that you have lab work- CBC and TSH  If you have labs (blood work) drawn today and your tests are completely normal, you will receive your results only by: [place] (if you have MyChart) OR A paper copy in the mail If you have any lab test that is abnormal or we need to change your treatment, we will call you to review the results.  Testing/Procedures: None ordered today.  Follow-Up: At [facility], you and your health needs are our priority.  As part of our continuing mission to provide you with exceptional heart care, we have created designated Provider Care Teams.  These Care Teams include your primary Cardiologist (physician) and Advanced Practice Providers (APPs -  Physician Assistants and Nurse Practitioners) who all work together to provide you with the care you need, when you need it.  We recommend signing up for the patient portal called "MyChart".  Sign up information is provided on this After Visit Summary.  MyChart is used to connect with patients for Virtual Visits (Telemedicine).  Patients are able to view lab/test results, encounter notes, upcoming appointments, etc.  Non-urgent messages can be sent to your provider as well.   To learn more about what you can do with MyChart, go to [email or web address].    Your next appointment:   6 month(s)  Provider:   Dorris Carnes, MD

## 2022-08-31 ENCOUNTER — Other Ambulatory Visit (HOSPITAL_COMMUNITY): Payer: Self-pay

## 2022-08-31 ENCOUNTER — Other Ambulatory Visit: Payer: Self-pay

## 2022-08-31 LAB — TSH: TSH: 1.03 u[IU]/mL (ref 0.450–4.500)

## 2022-09-01 ENCOUNTER — Other Ambulatory Visit: Payer: Self-pay

## 2022-09-02 ENCOUNTER — Ambulatory Visit
Admission: RE | Admit: 2022-09-02 | Discharge: 2022-09-02 | Disposition: A | Discharge status: Home / Self Care | Payer: No Typology Code available for payment source | Source: Ambulatory Visit | Attending: Internal Medicine | Admitting: Internal Medicine

## 2022-09-02 ENCOUNTER — Telehealth: Payer: Self-pay | Admitting: Internal Medicine

## 2022-09-02 DIAGNOSIS — Z1231 Encounter for screening mammogram for malignant neoplasm of breast: Secondary | ICD-10-CM

## 2022-09-02 NOTE — Telephone Encounter (Signed)
Called patient to see how she was doing  CBC and TSH normal She has stopped amlodipine  She says yesterday she felt pretty good     Did a little more   Still gets a little winded  Today, tired again    Gives out easy  She has gained a lot of weight in the past few months     Still with loose BMS  Angina in 2019 was back pain  She has had none nor has she had chest pain  Keep followoing  Stay hydrated     Keep up with my chart response next week  Has appt with Dr Loletha Carrow on 09/22/22

## 2022-09-03 ENCOUNTER — Other Ambulatory Visit (HOSPITAL_COMMUNITY): Payer: Self-pay

## 2022-09-03 ENCOUNTER — Other Ambulatory Visit: Payer: Self-pay | Admitting: Physician Assistant

## 2022-09-03 ENCOUNTER — Other Ambulatory Visit: Payer: Self-pay

## 2022-09-03 MED ORDER — LOSARTAN POTASSIUM 100 MG PO TABS
100.0000 mg | ORAL_TABLET | Freq: Every day | ORAL | 3 refills | Status: DC
  Filled 2022-09-03: qty 90, 90d supply, fill #0

## 2022-09-07 ENCOUNTER — Other Ambulatory Visit: Payer: Self-pay | Admitting: Internal Medicine

## 2022-09-07 DIAGNOSIS — R928 Other abnormal and inconclusive findings on diagnostic imaging of breast: Secondary | ICD-10-CM

## 2022-09-15 ENCOUNTER — Other Ambulatory Visit: Payer: Self-pay | Admitting: Internal Medicine

## 2022-09-15 ENCOUNTER — Other Ambulatory Visit: Payer: Self-pay

## 2022-09-15 DIAGNOSIS — F3342 Major depressive disorder, recurrent, in full remission: Secondary | ICD-10-CM

## 2022-09-16 ENCOUNTER — Encounter: Payer: Self-pay | Admitting: Internal Medicine

## 2022-09-17 ENCOUNTER — Ambulatory Visit
Admission: RE | Admit: 2022-09-17 | Discharge: 2022-09-17 | Disposition: A | Discharge status: Home / Self Care | Payer: No Typology Code available for payment source | Source: Ambulatory Visit | Attending: Internal Medicine | Admitting: Internal Medicine

## 2022-09-17 ENCOUNTER — Ambulatory Visit
Admission: RE | Admit: 2022-09-17 | Discharge: 2022-09-17 | Disposition: A | Discharge status: Home / Self Care | Payer: Medicaid Other | Source: Ambulatory Visit | Attending: Internal Medicine | Admitting: Internal Medicine

## 2022-09-17 ENCOUNTER — Other Ambulatory Visit: Payer: Self-pay | Admitting: Internal Medicine

## 2022-09-17 DIAGNOSIS — R928 Other abnormal and inconclusive findings on diagnostic imaging of breast: Secondary | ICD-10-CM

## 2022-09-17 DIAGNOSIS — N6489 Other specified disorders of breast: Secondary | ICD-10-CM | POA: Diagnosis not present

## 2022-09-17 DIAGNOSIS — R922 Inconclusive mammogram: Secondary | ICD-10-CM | POA: Diagnosis not present

## 2022-09-17 DIAGNOSIS — N6310 Unspecified lump in the right breast, unspecified quadrant: Secondary | ICD-10-CM

## 2022-09-19 NOTE — Telephone Encounter (Signed)
Agree with recommendations: PCP needs to be aware about how feeling, (dizzy, GI symptoms) Stay hydrated   Get at least 1500 calories in

## 2022-09-20 ENCOUNTER — Other Ambulatory Visit: Payer: Self-pay

## 2022-09-20 ENCOUNTER — Other Ambulatory Visit: Payer: Self-pay | Admitting: Internal Medicine

## 2022-09-20 DIAGNOSIS — F3342 Major depressive disorder, recurrent, in full remission: Secondary | ICD-10-CM

## 2022-09-22 ENCOUNTER — Encounter: Payer: Self-pay | Admitting: Physician Assistant

## 2022-09-22 ENCOUNTER — Ambulatory Visit (INDEPENDENT_AMBULATORY_CARE_PROVIDER_SITE_OTHER): Payer: Medicaid Other | Admitting: Physician Assistant

## 2022-09-22 ENCOUNTER — Other Ambulatory Visit (INDEPENDENT_AMBULATORY_CARE_PROVIDER_SITE_OTHER): Payer: Medicaid Other

## 2022-09-22 VITALS — BP 154/86 | HR 82 | Ht 65.0 in | Wt 160.0 lb

## 2022-09-22 DIAGNOSIS — R1084 Generalized abdominal pain: Secondary | ICD-10-CM

## 2022-09-22 DIAGNOSIS — K529 Noninfective gastroenteritis and colitis, unspecified: Secondary | ICD-10-CM | POA: Diagnosis not present

## 2022-09-22 DIAGNOSIS — R109 Unspecified abdominal pain: Secondary | ICD-10-CM

## 2022-09-22 LAB — SEDIMENTATION RATE: Sed Rate: 5 mm/hr (ref 0–30)

## 2022-09-22 LAB — C-REACTIVE PROTEIN: CRP: <1 mg/dL (ref 0.5–20.0)

## 2022-09-22 NOTE — Progress Notes (Signed)
Subjective:    Patient ID: Monique Hunt, female    DOB: 1960/06/09, 62 y.o.   MRN: [id number]  HPI Monique Hunt is a pleasant 62 year old white female, established with Dr. Myrtie Hunt who comes in today to discuss ongoing problems with diarrhea which she says has been present at least over the past year. She had been seen in April 2023 per Dr. Myrtie Hunt at that point felt most likely to have IBS.  Stool cultures were done and negative, TTG and IgA negative and she had been given a trial of dicyclomine. She had undergone colonoscopy in December 2020 with finding of pandiverticulosis and had 1 diminutive polyp removed from the transverse colon which was a tubular adenoma, random biopsies were negative for microscopic colitis.  She says that her diarrhea has gotten worse over the past months.  She does not have diarrhea every single day but says almost every day usually after meals and associated with abdominal cramping followed by progressively loose to watery stool.  She feels that the stool has been darker in general.  She usually has a least 2-3 bowel movements each morning, and may have a total of 7-8 bowel movements in a day.  She feels that almost any p.o. intake including liquids would generally bring on an episode of diarrhea within 15 to 20 minutes.  Does feel that caffeine will aggravate diarrhea as well, no other definite food aggravators. She is also complaining of ongoing fatigue which has been worse over the past year. She is having abdominal pain/discomfort in the mid abdomen and says she usually feels bloated and full feeling.  Appetite has been okay, no nausea or vomiting. Her only new medication has been Topamax but she started that about a year ago.  Other medical problems include hypertension, coronary artery disease status post MI 5 years ago with stent,, migraine headaches, anxiety/depression.  He had labs done on 08/30/2022 through her PCP-with normal TSH at 1.03 WBC 5.4/hemoglobin  13.1/hematocrit 39.0/platelets 296 BUN 27/creatinine 0.78, albumin 4.6 and LFTs within normal limits    Review of Systems. Pertinent positive and negative review of systems were noted in the above HPI section.  All other review of systems was otherwise negative.   Outpatient Encounter Medications as of 09/22/2022  Medication Sig   aspirin 81 MG chewable tablet Chew 1 tablet (81 mg total) by mouth daily.   atorvastatin (LIPITOR) 80 MG tablet 80 mg daily.   carvedilol (COREG) 25 MG tablet Take 1 tablet (25 mg total) by mouth 2 (two) times daily with a meal. Must have office visit for refills.   esomeprazole (NEXIUM) 20 MG capsule Take 20 mg by mouth daily at 12 noon.   Evolocumab (REPATHA SURECLICK) 140 MG/ML SOAJ Inject 1 pen into the skin every 14 days.   losartan (COZAAR) 100 MG tablet Take 1 tablet (100 mg total) by mouth daily.  Please call to schedule an appt before anymore refills. [phone].   nitroGLYCERIN (NITROSTAT) 0.4 MG SL tablet Place 1 tablet (0.4 mg total) under the tongue every 5 (five) minutes x 3 doses as needed for chest pain.   rosuvastatin (CRESTOR) 20 MG tablet Take 1 tablet by mouth daily.   topiramate (TOPAMAX) 50 MG tablet Take 0.5 tablets (25 mg total) by mouth at bedtime.   venlafaxine XR (EFFEXOR-XR) 75 MG 24 hr capsule Take 1 capsule (75 mg total) by mouth daily with breakfast.   albuterol (VENTOLIN HFA) 108 (90 Base) MCG/ACT inhaler INHALE 2 PUFFS INTO THE LUNGS  EVERY 4 (FOUR) HOURS AS NEEDED FOR WHEEZING OR SHORTNESS OF BREATH.   No facility-administered encounter medications on file as of 09/22/2022.   No Known Allergies Patient Active Problem List   Diagnosis Date Noted   Anxiety 01/18/2022   Migraine without aura and without status migrainosus, not intractable 04/06/2021   Prediabetes 11/05/2020   Coronary artery disease involving native coronary artery of native heart with angina pectoris 08/06/2020   Low TSH level 08/04/2018   Left thyroid nodule  04/07/2018   Lung nodule, solitary 04/07/2018   Hyperlipidemia LDL goal <70    Old MI (myocardial infarction) 12/15/2017   Essential hypertension 12/01/2017   Depression 06/04/2012   Nephrolithiasis 09/27/2011   Social History   Socioeconomic History   Marital status: Divorced    Spouse name: Not on file   Number of children: 2   Years of education: Not on file   Highest education level: Not on file  Occupational History   Not on file  Tobacco Use   Smoking status: Never   Smokeless tobacco: Never  Vaping Use   Vaping Use: Never used  Substance and Sexual Activity   Alcohol use: Yes    Comment: 3 TIMES A WEEK   Drug use: Never   Sexual activity: Yes    Comment: menopausal  Other Topics Concern   Not on file  Social History Narrative   Not on file   Social Determinants of Health   Financial Resource Strain: Not on file  Food Insecurity: Not on file  Transportation Needs: Not on file  Physical Activity: Not on file  Stress: Not on file  Social Connections: Not on file  Intimate Partner Violence: Not on file    Ms. Monique Hunt family history includes Breast cancer in her mother; CAD in her father; Heart disease in her father; Hyperlipidemia in her brother and sister.      Objective:    Vitals:   09/22/22 1022  BP: (!) 154/86  Pulse: 82    Physical Exam Well-developed well-nourished older WF in no acute distress.  Pleasant ,height, Weight,160  BMI 26.6  HEENT; nontraumatic normocephalic, EOMI, PE R LA, sclera anicteric. Oropharynx;not examined  Neck; supple, no JVD Cardiovascular; regular rate and rhythm with S1-S2, no murmur rub or gallop Pulmonary; Clear bilaterally Abdomen; soft, mild rather generalized tenderness, more notable in the low mid abdomen no guarding or rebound, nondistended, no palpable mass or hepatosplenomegaly, bowel sounds are active Rectal; not done today Skin; benign exam, no jaundice rash or appreciable lesions Extremities; no  clubbing cyanosis or edema skin warm and dry Neuro/Psych; alert and oriented x4, grossly nonfocal mood and affect appropriate        Assessment & Plan:   #51 62 year old white female with 1 year plus history of persistent diarrhea somewhat progressive over the past several months.  Not having diarrhea every single day but almost every day and up to 7-8 bowel movements per day, most p.o. intake including just drinking fluids will aggravate episodes of diarrhea and abdominal cramping. No significant response to dicyclomine as she has been taking this most days 3 times daily with no real change in symptoms. She does have significant tenderness on exam generally in the mid abdomen Prior testing for celiac disease negative Recent labs on 08/30/2022 unremarkable normal TSH CBC and c-Met  Rule out small bowel inflammatory process/IBD, consider medication induced diarrhea i.e. Topamax though she is on a low-dose Rule out IBS-D, rule out SIBO  #2 colon cancer  screening-up-to-date with last colonoscopy December 2020, 1 diminutive tubular adenomatous polyp-indicated for 7-year interval follow-up #3.  Pandiverticulosis #4.  Hypertension #5.  Coronary artery disease status post MI 5 years ago status post stent #6.  Migraine headaches #7.  Anxiety/depression  Plan; check sed rate and CRP Schedule for CT of the abdomen and pelvis with contrast Pending results of CT will decide about further stool testing with fecal calprotectin and fecal elastase, possible repeat colonoscopy. She can discontinue dicyclomine and is she did not seem to have any benefit from this. In the short-term avoid artificial sweeteners, avoid caffeine, and may use Imodium up to 4 tablets daily.  Suggested she may want to just take 1 Imodium each morning, and then can use further doses if needed later in the day.  Elvina Bosch Oswald Hillock PA-C 09/22/2022   Cc: Marcine Matar, MD

## 2022-09-22 NOTE — Patient Instructions (Signed)
_______________________________________________________  If your blood pressure at your visit was 140/90 or greater, please contact your primary care physician to follow up on this.  If you are age 63 or younger, your body mass index should be between 19-25. Your Body mass index is 26.63 kg/m. If this is out of the aformentioned range listed, please consider follow up with your Primary Care Provider.  ________________________________________________________  The [facility] providers would like to encourage you to use [facility] to communicate with providers for non-urgent requests or questions.  Due to long hold times on the telephone, sending your provider a message by [facility] may be a faster and more efficient way to get a response.  Please allow 48 business hours for a response.  Please remember that this is for non-urgent requests.  _______________________________________________________  Please purchase the following medications over the counter and take as directed:  You can try Imodium one tablet 30-45 minutes before meals as needed.  DISCONTINUE: dicyclomine  Please avoid artificial sweetners and eat a bland diet.  Your provider has requested that you go to the basement level for lab work before leaving today. Press "B" on the elevator. The lab is located at the first door on the left as you exit the elevator.  You have been scheduled for a CT scan of the abdomen and pelvis at [facility], 1st floor Radiology. You are scheduled on 10-08-22 at 4pm. You should arrive at 2pm in order to register and drink prep.  1) Do not eat anything after 12pm (4 hours prior to your test)   Due to recent changes in healthcare laws, you may see the results of your imaging and laboratory studies on MyChart before your provider has had a chance to review them.  We understand that in some cases there may be results that are confusing or concerning to you. Not all laboratory results come back in the  same time frame and the provider may be waiting for multiple results in order to interpret others.  Please give [place] 48 hours in order for your provider to thoroughly review all the results before contacting the office for clarification of your results.   Thank you for entrusting me with your care and choosing [facility].  Amy Esterwood, PA-C

## 2022-09-24 ENCOUNTER — Other Ambulatory Visit: Payer: Self-pay | Admitting: Internal Medicine

## 2022-09-24 DIAGNOSIS — F3342 Major depressive disorder, recurrent, in full remission: Secondary | ICD-10-CM

## 2022-09-26 ENCOUNTER — Other Ambulatory Visit: Payer: Self-pay | Admitting: Internal Medicine

## 2022-09-26 DIAGNOSIS — F3342 Major depressive disorder, recurrent, in full remission: Secondary | ICD-10-CM

## 2022-09-27 ENCOUNTER — Ambulatory Visit
Admission: RE | Admit: 2022-09-27 | Discharge: 2022-09-27 | Disposition: A | Discharge status: Home / Self Care | Payer: Medicaid Other | Source: Ambulatory Visit | Attending: Internal Medicine | Admitting: Internal Medicine

## 2022-09-27 DIAGNOSIS — R928 Other abnormal and inconclusive findings on diagnostic imaging of breast: Secondary | ICD-10-CM

## 2022-09-27 DIAGNOSIS — N6315 Unspecified lump in the right breast, overlapping quadrants: Secondary | ICD-10-CM | POA: Diagnosis not present

## 2022-09-27 DIAGNOSIS — N6011 Diffuse cystic mastopathy of right breast: Secondary | ICD-10-CM | POA: Diagnosis not present

## 2022-09-27 DIAGNOSIS — N63 Unspecified lump in unspecified breast: Secondary | ICD-10-CM | POA: Diagnosis not present

## 2022-09-27 DIAGNOSIS — N6310 Unspecified lump in the right breast, unspecified quadrant: Secondary | ICD-10-CM

## 2022-09-27 HISTORY — PX: BREAST BIOPSY: SHX20

## 2022-09-27 NOTE — Progress Notes (Signed)
____________________________________________________________  Attending physician addendum:  Thank you for sending this case to me. I have reviewed the entire note and agree with the general plan with the following additions:  If CT unrevealing, I recommend a course of empiric Abx for possible SIBO as well as a fecal elastase prior to considering a repeat colonoscopy.  Colonoscopy Dec 2020 with Bx neg for microscopic colitis was being done for chronic diarrhea.  Amada Jupiter, MD  ____________________________________________________________

## 2022-09-28 ENCOUNTER — Telehealth: Payer: Self-pay | Admitting: Internal Medicine

## 2022-09-28 ENCOUNTER — Other Ambulatory Visit (HOSPITAL_COMMUNITY): Payer: Self-pay

## 2022-09-28 ENCOUNTER — Encounter: Payer: Self-pay | Admitting: Internal Medicine

## 2022-09-28 ENCOUNTER — Other Ambulatory Visit: Payer: Self-pay

## 2022-09-28 DIAGNOSIS — F3342 Major depressive disorder, recurrent, in full remission: Secondary | ICD-10-CM

## 2022-09-28 MED ORDER — VENLAFAXINE HCL ER 75 MG PO CP24
75.0000 mg | ORAL_CAPSULE | Freq: Every day | ORAL | 0 refills | Status: DC
Start: 2022-09-28 — End: 2022-10-13
  Filled 2022-09-28 (×2): qty 15, 15d supply, fill #0

## 2022-09-28 NOTE — Telephone Encounter (Signed)
Sent 15 days of venlafaxine XR (EFFEXOR-XR) 75 MG 24 hr capsule with no additional refill.

## 2022-09-28 NOTE — Telephone Encounter (Signed)
Medication Refill - Medication: venlafaxine XR (EFFEXOR-XR) 75 MG 24 hr capsule   Has the patient contacted their pharmacy? Yes.     Preferred Pharmacy (with phone number or street name):  [facility] Phone: [phone]  Fax: [phone]     Has the patient been seen for an appointment in the last year OR does the patient have an upcoming appointment? Yes.   Next OV 10/13/22  Agent: Please be advised that RX refills may take up to 3 business days. We ask that you follow-up with your pharmacy.  *Patient states that she has been out of the medication for 3 days. Patient is scheduled for next available appointment on 10/13/22. Patient is requesting a courtesy refill to last until her appointment.

## 2022-09-29 ENCOUNTER — Other Ambulatory Visit (HOSPITAL_COMMUNITY): Payer: Self-pay

## 2022-10-08 ENCOUNTER — Ambulatory Visit (HOSPITAL_COMMUNITY)
Admission: RE | Admit: 2022-10-08 | Discharge: 2022-10-08 | Disposition: A | Discharge status: Home / Self Care | Payer: Medicaid Other | Source: Ambulatory Visit | Attending: Physician Assistant | Admitting: Physician Assistant

## 2022-10-08 DIAGNOSIS — K529 Noninfective gastroenteritis and colitis, unspecified: Secondary | ICD-10-CM | POA: Diagnosis present

## 2022-10-08 DIAGNOSIS — R109 Unspecified abdominal pain: Secondary | ICD-10-CM

## 2022-10-08 DIAGNOSIS — R1084 Generalized abdominal pain: Secondary | ICD-10-CM | POA: Diagnosis present

## 2022-10-08 MED ORDER — IOHEXOL 9 MG/ML PO SOLN: 500.0000 mL | ORAL | Status: AC

## 2022-10-08 MED ORDER — IOHEXOL 300 MG/ML  SOLN
100.0000 mL | Freq: Once | INTRAMUSCULAR | Status: AC | PRN
  Administered 2022-10-08: 100 mL via INTRAVENOUS

## 2022-10-13 ENCOUNTER — Other Ambulatory Visit: Payer: Self-pay

## 2022-10-13 ENCOUNTER — Ambulatory Visit: Payer: Medicaid Other | Attending: Physician Assistant | Admitting: Physician Assistant

## 2022-10-13 ENCOUNTER — Telehealth: Payer: Self-pay | Admitting: Physician Assistant

## 2022-10-13 ENCOUNTER — Encounter: Payer: Self-pay | Admitting: Physician Assistant

## 2022-10-13 VITALS — BP 120/81 | HR 105 | Wt 157.6 lb

## 2022-10-13 DIAGNOSIS — I10 Essential (primary) hypertension: Secondary | ICD-10-CM

## 2022-10-13 DIAGNOSIS — I2510 Atherosclerotic heart disease of native coronary artery without angina pectoris: Secondary | ICD-10-CM

## 2022-10-13 DIAGNOSIS — R7303 Prediabetes: Secondary | ICD-10-CM

## 2022-10-13 DIAGNOSIS — F3342 Major depressive disorder, recurrent, in full remission: Secondary | ICD-10-CM

## 2022-10-13 DIAGNOSIS — K529 Noninfective gastroenteritis and colitis, unspecified: Secondary | ICD-10-CM

## 2022-10-13 DIAGNOSIS — G43009 Migraine without aura, not intractable, without status migrainosus: Secondary | ICD-10-CM | POA: Diagnosis not present

## 2022-10-13 DIAGNOSIS — Z09 Encounter for follow-up examination after completed treatment for conditions other than malignant neoplasm: Secondary | ICD-10-CM

## 2022-10-13 MED ORDER — TOPIRAMATE 50 MG PO TABS
25.0000 mg | ORAL_TABLET | Freq: Every day | ORAL | 2 refills | Status: DC
Start: 2022-10-13 — End: 2023-04-22
  Filled 2022-10-13: qty 30, 60d supply, fill #0
  Filled 2022-12-30: qty 30, 60d supply, fill #1
  Filled 2023-02-23: qty 30, 60d supply, fill #2

## 2022-10-13 MED ORDER — ALBUTEROL SULFATE HFA 108 (90 BASE) MCG/ACT IN AERS
2.0000 | INHALATION_SPRAY | RESPIRATORY_TRACT | 2 refills | Status: DC | PRN
Start: 2022-10-13 — End: 2023-07-19
  Filled 2022-10-13: qty 18, 17d supply, fill #0

## 2022-10-13 MED ORDER — VENLAFAXINE HCL ER 75 MG PO CP24
75.0000 mg | ORAL_CAPSULE | Freq: Every day | ORAL | 5 refills | Status: DC
Start: 2022-10-13 — End: 2023-03-26
  Filled 2022-10-13: qty 30, 30d supply, fill #0
  Filled 2022-11-03 – 2022-11-07 (×2): qty 30, 30d supply, fill #1
  Filled 2022-12-01: qty 30, 30d supply, fill #2
  Filled 2022-12-30: qty 30, 30d supply, fill #3
  Filled 2023-02-01: qty 30, 30d supply, fill #4
  Filled 2023-02-26: qty 30, 30d supply, fill #5

## 2022-10-13 MED ORDER — LOSARTAN POTASSIUM 100 MG PO TABS
100.0000 mg | ORAL_TABLET | Freq: Every day | ORAL | 3 refills | Status: DC
Start: 2022-10-13 — End: 2023-12-03
  Filled 2022-10-13 – 2022-12-01 (×2): qty 90, 90d supply, fill #0
  Filled 2023-03-02: qty 90, 90d supply, fill #1
  Filled 2023-05-30: qty 90, 90d supply, fill #2
  Filled 2023-08-28: qty 90, 90d supply, fill #3

## 2022-10-13 MED ORDER — ROSUVASTATIN CALCIUM 20 MG PO TABS
20.0000 mg | ORAL_TABLET | Freq: Every day | ORAL | 3 refills | Status: DC
Start: 2022-10-13 — End: 2023-10-03
  Filled 2022-10-13: qty 90, 90d supply, fill #0
  Filled 2023-01-05: qty 90, 90d supply, fill #1
  Filled 2023-04-10: qty 90, 90d supply, fill #2
  Filled 2023-07-06: qty 90, 90d supply, fill #3
  Filled 2023-07-06: qty 90, 90d supply, fill #0

## 2022-10-13 MED ORDER — SEMAGLUTIDE-WEIGHT MANAGEMENT 0.5 MG/0.5ML ~~LOC~~ SOAJ
0.5000 mg | SUBCUTANEOUS | 1 refills | Status: DC
  Filled 2022-10-13: qty 2, fill #0
  Filled 2022-10-13: qty 2, 28d supply, fill #0
  Filled 2022-11-04: qty 2, 28d supply, fill #1

## 2022-10-13 MED ORDER — CARVEDILOL 25 MG PO TABS
25.0000 mg | ORAL_TABLET | Freq: Two times a day (BID) | ORAL | 1 refills | Status: DC
Start: 2022-10-13 — End: 2023-04-26
  Filled 2022-10-13 – 2022-11-03 (×2): qty 180, 90d supply, fill #0
  Filled 2023-01-28: qty 180, 90d supply, fill #1

## 2022-10-13 NOTE — Telephone Encounter (Signed)
Patient called to return phone cal regarding recent imaging results. Requesting a call back. Please advise, thank you.

## 2022-10-13 NOTE — Telephone Encounter (Signed)
See alternate note  

## 2022-11-01 ENCOUNTER — Other Ambulatory Visit: Payer: Self-pay

## 2022-11-04 ENCOUNTER — Other Ambulatory Visit: Payer: Self-pay

## 2022-11-04 ENCOUNTER — Encounter: Payer: Self-pay | Admitting: Physician Assistant

## 2022-11-04 ENCOUNTER — Other Ambulatory Visit (HOSPITAL_COMMUNITY): Payer: Self-pay

## 2022-11-04 NOTE — Progress Notes (Signed)
Patient ID: Monique Hunt, female   DOB: 1961/05/22, 62 y.o.   MRN: [id number]   Monique Hunt, is a 62 y.o. female  [id number]  [id number]  DOB - 24-Jul-1960  Chief Complaint  Patient presents with   Medication Refill       Subjective:   Monique Hunt is a 62 y.o. female here today for needing RF and wants to try wegovy for prediabetes.  She has tried exercise and diet changes.    No problems updated.  ALLERGIES: No Known Allergies  PAST MEDICAL HISTORY: Past Medical History:  Diagnosis Date   Coronary artery disease    Depression    History of kidney stones    Hyperlipidemia    Hypertension    Migraine    "stopped ~ 2010" (12/15/2017)   NSTEMI (non-ST elevated myocardial infarction) (HCC) 12/15/2017    MEDICATIONS AT HOME: Prior to Admission medications   Medication Sig Start Date End Date Taking? Authorizing Provider  esomeprazole (NEXIUM) 20 MG capsule Take 20 mg by mouth daily at 12 noon.   Yes [provider]  Evolocumab (REPATHA SURECLICK) 140 MG/ML SOAJ Inject 1 pen into the skin every 14 days. 08/30/22  Yes Pricilla Riffle, MD  nitroGLYCERIN (NITROSTAT) 0.4 MG SL tablet Place 1 tablet (0.4 mg total) under the tongue every 5 (five) minutes x 3 doses as needed for chest pain. 12/18/19  Yes Tereso Newcomer T, PA-C  Semaglutide-Weight Management 0.5 MG/0.5ML SOAJ Inject 0.5 mg into the skin once a week. 10/13/22  Yes Bassheva Flury, Marzella Schlein, PA-C  albuterol (VENTOLIN HFA) 108 (90 Base) MCG/ACT inhaler INHALE 2 PUFFS INTO THE LUNGS EVERY 4 (FOUR) HOURS AS NEEDED FOR WHEEZING OR SHORTNESS OF BREATH. 10/13/22 10/13/23  Anders Simmonds, PA-C  aspirin 81 MG chewable tablet Chew 1 tablet (81 mg total) by mouth daily. 12/21/17   Robbie Lis M, PA-C  carvedilol (COREG) 25 MG tablet Take 1 tablet (25 mg total) by mouth 2 (two) times daily with a meal. 10/13/22   Jaydn Moscato, Marzella Schlein, PA-C  losartan (COZAAR) 100 MG tablet Take 1 tablet (100 mg total) by mouth  daily.  Please call to schedule an appt before anymore refills. [phone]. 10/13/22   Anders Simmonds, PA-C  rosuvastatin (CRESTOR) 20 MG tablet Take 1 tablet by mouth daily. 10/13/22   Anders Simmonds, PA-C  topiramate (TOPAMAX) 50 MG tablet Take 0.5 tablets (25 mg total) by mouth at bedtime. 10/13/22   Anders Simmonds, PA-C  venlafaxine XR (EFFEXOR-XR) 75 MG 24 hr capsule Take 1 capsule (75 mg total) by mouth daily with breakfast. 10/13/22   Yadier Bramhall, Marzella Schlein, PA-C    ROS: Neg HEENT Neg resp Neg cardiac Neg GI Neg GU Neg MS Neg psych Neg neuro  Objective:   Vitals:   10/13/22 1622  BP: 120/81  Pulse: (!) 105  SpO2: 97%  Weight: 157 lb 9.6 oz (71.5 kg)   Exam General appearance : Awake, alert, not in any distress. Speech Clear. Not toxic looking HEENT: Atraumatic and Normocephalic Neck: Supple, no JVD. No cervical lymphadenopathy.  Chest: Good air entry bilaterally, CTAB.  No rales/rhonchi/wheezing CVS: S1 S2 regular, no murmurs.  Extremities: B/L Lower Ext shows no edema, both legs are warm to touch Neurology: Awake alert, and oriented X 3, CN II-XII intact, Non focal Skin: No Rash  Data Review Lab Results  Component Value Date   HGBA1C 6.0 (H) 11/04/2020   HGBA1C 5.6 11/05/2019   HGBA1C 6.0 (H) 12/15/2017  Assessment & Plan   1. Prediabetes A1C from 5.6-6.0.  can try for wegovy - Amb Ref to Medical Weight Management - HgB A1c  2. Recurrent major depressive disorder, in full remission (HCC) stable - venlafaxine XR (EFFEXOR-XR) 75 MG 24 hr capsule; Take 1 capsule (75 mg total) by mouth daily with breakfast.  Dispense: 30 capsule; Refill: 5  3. Migraine without aura and without status migrainosus, not intractable - topiramate (TOPAMAX) 50 MG tablet; Take 0.5 tablets (25 mg total) by mouth at bedtime.  Dispense: 30 tablet; Refill: 2  4. Essential hypertension controlled - carvedilol (COREG) 25 MG tablet; Take 1 tablet (25 mg total) by mouth 2 (two)  times daily with a meal.  Dispense: 180 tablet; Refill: 1 - losartan (COZAAR) 100 MG tablet; Take 1 tablet (100 mg total) by mouth daily.  Please call to schedule an appt before anymore refills. [phone].  Dispense: 90 tablet; Refill: 3  5. Hospital discharge follow-up - albuterol (VENTOLIN HFA) 108 (90 Base) MCG/ACT inhaler; INHALE 2 PUFFS INTO THE LUNGS EVERY 4 (FOUR) HOURS AS NEEDED FOR WHEEZING OR SHORTNESS OF BREATH.  Dispense: 18 g; Refill: 2  6. Coronary artery disease involving native coronary artery of native heart without angina pectoris - rosuvastatin (CRESTOR) 20 MG tablet; Take 1 tablet by mouth daily.  Dispense: 90 tablet; Refill: 3    Return in about 1 month (around 11/13/2022) for with PCP for weight loss recheck.  The patient was given clear instructions to go to ER or return to medical center if symptoms don't improve, worsen or new problems develop. The patient verbalized understanding. The patient was told to call to get lab results if they haven't heard anything in the next week.      Georgian Co, PA-C [facility] [place], [place] [phone]   11/04/2022, 12:20 PM

## 2022-11-08 ENCOUNTER — Other Ambulatory Visit: Payer: Self-pay

## 2022-11-30 ENCOUNTER — Other Ambulatory Visit (HOSPITAL_COMMUNITY): Payer: Self-pay

## 2022-12-01 ENCOUNTER — Ambulatory Visit: Payer: Medicaid Other | Admitting: Physician Assistant

## 2022-12-03 ENCOUNTER — Other Ambulatory Visit: Payer: Self-pay

## 2022-12-03 ENCOUNTER — Other Ambulatory Visit (HOSPITAL_COMMUNITY): Payer: Self-pay

## 2022-12-06 ENCOUNTER — Other Ambulatory Visit (HOSPITAL_COMMUNITY): Payer: Self-pay

## 2022-12-10 ENCOUNTER — Ambulatory Visit: Payer: Medicaid Other | Attending: Physician Assistant | Admitting: Internal Medicine

## 2022-12-10 ENCOUNTER — Other Ambulatory Visit (HOSPITAL_COMMUNITY): Payer: Self-pay

## 2022-12-10 ENCOUNTER — Other Ambulatory Visit: Payer: Self-pay

## 2022-12-10 ENCOUNTER — Encounter: Payer: Self-pay | Admitting: Internal Medicine

## 2022-12-10 ENCOUNTER — Telehealth: Payer: Self-pay

## 2022-12-10 VITALS — BP 138/96 | HR 79 | Wt 139.6 lb

## 2022-12-10 DIAGNOSIS — Z7689 Persons encountering health services in other specified circumstances: Secondary | ICD-10-CM | POA: Diagnosis not present

## 2022-12-10 DIAGNOSIS — I10 Essential (primary) hypertension: Secondary | ICD-10-CM | POA: Diagnosis not present

## 2022-12-10 DIAGNOSIS — I2510 Atherosclerotic heart disease of native coronary artery without angina pectoris: Secondary | ICD-10-CM | POA: Diagnosis not present

## 2022-12-10 DIAGNOSIS — Z23 Encounter for immunization: Secondary | ICD-10-CM | POA: Diagnosis not present

## 2022-12-10 DIAGNOSIS — F3342 Major depressive disorder, recurrent, in full remission: Secondary | ICD-10-CM | POA: Diagnosis not present

## 2022-12-10 MED ORDER — AMLODIPINE BESYLATE 5 MG PO TABS
5.0000 mg | ORAL_TABLET | Freq: Every day | ORAL | 1 refills | Status: DC
Start: 2022-12-10 — End: 2023-06-04
  Filled 2022-12-10 – 2022-12-11 (×2): qty 90, 90d supply, fill #0
  Filled 2023-03-08: qty 90, 90d supply, fill #1

## 2022-12-10 MED ORDER — SEMAGLUTIDE-WEIGHT MANAGEMENT 0.5 MG/0.5ML ~~LOC~~ SOAJ
0.5000 mg | SUBCUTANEOUS | 2 refills | Status: AC
Start: 2022-12-10 — End: ?
  Filled 2022-12-10 (×2): qty 2, 28d supply, fill #0
  Filled 2023-01-05: qty 2, 28d supply, fill #1
  Filled 2023-02-02: qty 2, 28d supply, fill #2

## 2022-12-10 NOTE — Telephone Encounter (Signed)
A PRIOR AUTHORIZATION REQUEST FOR WEGOVY HAS BEEN SUBMITTED TO INSURANCE VIA [employer] Key: [id number]

## 2022-12-10 NOTE — Progress Notes (Signed)
Patient ID: Monique Hunt, female    DOB: [birth year]  MRN: [id number]  CC: medication follow up and Medication Refill (On wagovy)   Subjective: Monique Hunt is a 62 y.o. female who presents for f/u weight management Her concerns today include:  Patient with history of HTN, CAD one stent to LAD 11/2017, depression, HL, LT thyroid nodule (no further f/u needed per endocrinology Dr. Lonzo Cloud), RT lung nodule (stable in size on CT 07/2020)   Started on Wegovy 10/13/2022 by our PA for med wgh management Tolerating med well without any major side effects.  Currently on the 0.5 mg dose. Has dec appetite.  Loss 18 lbs since last visit [employer] 1 mth ago.  Going 3x/wk - walks 1/2 mile,then 10-15 mins on stationary bike then light wgh machines.    HTN/CAD: Reports compliance with carvedilol 25 mg daily, Cozaar 100 mg daily, Crestor 20 mg daily, Repatha twice a month injection No CP/SOB/LE edema.  No recent use of SL  Has BP device but not checking recently. Tries to limit salt in foods but likes to use it on mellons.  No drug use She has cut back significantly on ETOH use.  Last drink 1 bev 12/02/2022.    MDD:  doing well on Effexor. In remission Patient Active Problem List   Diagnosis Date Noted   Anxiety 01/18/2022   Migraine without aura and without status migrainosus, not intractable 04/06/2021   Prediabetes 11/05/2020   Coronary artery disease involving native coronary artery of native heart with angina pectoris (HCC) 08/06/2020   Low TSH level 08/04/2018   Left thyroid nodule 04/07/2018   Lung nodule, solitary 04/07/2018   Hyperlipidemia LDL goal <70    Old MI (myocardial infarction) 12/15/2017   Essential hypertension 12/01/2017   Depression 06/04/2012   Nephrolithiasis 09/27/2011     Current Outpatient Medications on File Prior to Visit  Medication Sig Dispense Refill   carvedilol (COREG) 25 MG tablet Take 1 tablet (25 mg total) by mouth 2 (two) times  daily with a meal. 180 tablet 1   esomeprazole (NEXIUM) 20 MG capsule Take 20 mg by mouth daily at 12 noon.     Evolocumab (REPATHA SURECLICK) 140 MG/ML SOAJ Inject 1 pen into the skin every 14 days. 2 mL 11   losartan (COZAAR) 100 MG tablet Take 1 tablet (100 mg total) by mouth daily.  Please call to schedule an appt before anymore refills. [phone]. 90 tablet 3   rosuvastatin (CRESTOR) 20 MG tablet Take 1 tablet by mouth daily. 90 tablet 3   topiramate (TOPAMAX) 50 MG tablet Take 0.5 tablets (25 mg total) by mouth at bedtime. 30 tablet 2   venlafaxine XR (EFFEXOR-XR) 75 MG 24 hr capsule Take 1 capsule (75 mg total) by mouth daily with breakfast. 30 capsule 5   albuterol (VENTOLIN HFA) 108 (90 Base) MCG/ACT inhaler INHALE 2 PUFFS INTO THE LUNGS EVERY 4 (FOUR) HOURS AS NEEDED FOR WHEEZING OR SHORTNESS OF BREATH. (Patient not taking: Reported on 12/10/2022) 18 g 2   aspirin 81 MG chewable tablet Chew 1 tablet (81 mg total) by mouth daily. 30 tablet 11   nitroGLYCERIN (NITROSTAT) 0.4 MG SL tablet Place 1 tablet (0.4 mg total) under the tongue every 5 (five) minutes x 3 doses as needed for chest pain. (Patient not taking: Reported on 12/10/2022) 30 tablet 3   No current facility-administered medications on file prior to visit.    No Known Allergies  Social History  Socioeconomic History   Marital status: Divorced    Spouse name: Not on file   Number of children: 2   Years of education: Not on file   Highest education level: Associate degree: occupational, Scientist, product/process development, or vocational program  Occupational History   Not on file  Tobacco Use   Smoking status: Never   Smokeless tobacco: Never  Vaping Use   Vaping status: Never Used  Substance and Sexual Activity   Alcohol use: Yes    Comment: 3 TIMES A WEEK   Drug use: Never   Sexual activity: Yes    Comment: menopausal  Other Topics Concern   Not on file  Social History Narrative   Not on file   Social Determinants of Health    Financial Resource Strain: Low Risk  (10/10/2022)   Overall Financial Resource Strain (CARDIA)    Difficulty of Paying Living Expenses: Not hard at all  Food Insecurity: No Food Insecurity (10/10/2022)   Hunger Vital Sign    Worried About Running Out of Food in the Last Year: Never true    Ran Out of Food in the Last Year: Never true  Transportation Needs: No Transportation Needs (10/10/2022)   PRAPARE - Administrator, Civil Service (Medical): No    Lack of Transportation (Non-Medical): No  Physical Activity: Unknown (10/10/2022)   Exercise Vital Sign    Days of Exercise per Week: 0 days    Minutes of Exercise per Session: Not on file  Stress: No Stress Concern Present (10/10/2022)   [employer] of Occupational Health - Occupational Stress Questionnaire    Feeling of Stress : Not at all  Social Connections: Moderately Integrated (10/10/2022)   Social Connection and Isolation Panel [NHANES]    Frequency of Communication with Friends and Family: More than three times a week    Frequency of Social Gatherings with Friends and Family: Three times a week    Attends Religious Services: 1 to 4 times per year    Active Member of Clubs or Organizations: No    Attends Engineer, structural: Not on file    Marital Status: Living with partner  Intimate Partner Violence: Not on file    Family History  Problem Relation Age of Onset   CAD Father    Heart disease Father    Breast cancer Mother    Hyperlipidemia Sister    Hyperlipidemia Brother    Colon cancer Neg Hx    Esophageal cancer Neg Hx    Rectal cancer Neg Hx    Stomach cancer Neg Hx     Past Surgical History:  Procedure Laterality Date   BREAST BIOPSY Right 09/27/2022   [place] RT BREAST BX W LOC DEV 1ST LESION IMG BX SPEC US GUIDE 09/27/2022 [facility] MAMMOGRAPHY   CORONARY STENT INTERVENTION N/A 12/19/2017   Procedure: CORONARY STENT INTERVENTION;  Surgeon: Lyn Records, MD;  Location: [facility];   Service: Cardiovascular;  Laterality: N/A;   CYSTOSCOPY W/ STONE MANIPULATION     FACIAL FRACTURE SURGERY  1979   LEFT HEART CATH AND CORONARY ANGIOGRAPHY N/A 12/16/2017   Procedure: LEFT HEART CATH AND CORONARY ANGIOGRAPHY;  Surgeon: Lyn Records, MD;  Location: [facility];  Service: Cardiovascular;  Laterality: N/A;   LEFT HEART CATH AND CORONARY ANGIOGRAPHY N/A 12/19/2019   Procedure: LEFT HEART CATH AND CORONARY ANGIOGRAPHY;  Surgeon: Runell Gess, MD;  Location: [facility];  Service: Cardiovascular;  Laterality: N/A;  LITHOTRIPSY     SKIN SURGERY Right    "birthmark removed under my arm when I was little"    ROS: Review of Systems Negative except as stated above  PHYSICAL EXAM: BP (!) 138/96   Pulse 79   Wt 139 lb 9.6 oz (63.3 kg)   LMP 02/28/2011   SpO2 98%   BMI 23.23 kg/m   Wt Readings from Last 3 Encounters:  12/10/22 139 lb 9.6 oz (63.3 kg)  10/13/22 157 lb 9.6 oz (71.5 kg)  09/22/22 160 lb (72.6 kg)    Physical Exam  General appearance - alert, well appearing, older Caucasian female and in no distress Mental status - normal mood, behavior, speech, dress, motor activity, and thought processes Chest - clear to auscultation, no wheezes, rales or rhonchi, symmetric air entry Heart - normal rate, regular rhythm, normal S1, S2, no murmurs, rubs, clicks or gallops Extremities - peripheral pulses normal, no pedal edema, no clubbing or cyanosis     12/10/2022    9:12 AM 02/26/2022    9:10 AM 08/25/2021    2:12 PM  Depression screen PHQ 2/9  Decreased Interest 0 1 0  Down, Depressed, Hopeless 0 1 1  PHQ - 2 Score 0 2 1  Altered sleeping  1   Tired, decreased energy  1   Change in appetite  0   Feeling bad or failure about yourself   0   Trouble concentrating  0   Moving slowly or fidgety/restless  0   Suicidal thoughts  0   PHQ-9 Score  4        Latest Ref Rng & Units 08/19/2022    8:50 AM 08/25/2021    2:43 PM 11/04/2020    4:43 PM  CMP   Glucose 70 - 99 mg/dL 161  096  92   BUN 8 - 27 mg/dL 27  17  25    Creatinine 0.57 - 1.00 mg/dL 0.45  4.09  8.11   Sodium 134 - 144 mmol/L 143  137  138   Potassium 3.5 - 5.2 mmol/L 4.7  5.1  4.3   Chloride 96 - 106 mmol/L 104  103  101   CO2 20 - 29 mmol/L 23  22  22    Calcium 8.7 - 10.3 mg/dL 91.4  78.2  95.6   Total Protein 6.0 - 8.5 g/dL 7.4  7.9    Total Bilirubin 0.0 - 1.2 mg/dL 0.2  0.4    Alkaline Phos 44 - 121 IU/L 90  117    AST 0 - 40 IU/L 28  24    ALT 0 - 32 IU/L 41  29     Lipid Panel     Component Value Date/Time   CHOL 108 08/19/2022 0850   TRIG 184 (H) 08/19/2022 0850   HDL 72 08/19/2022 0850   CHOLHDL 1.5 08/19/2022 0850   CHOLHDL 7.2 12/16/2017 0714   VLDL 79 (H) 12/16/2017 0714   LDLCALC 8 08/19/2022 0850    CBC    Component Value Date/Time   WBC 5.4 08/30/2022 1042   WBC 7.7 08/18/2020 1703   RBC 4.06 08/30/2022 1042   RBC 4.85 08/18/2020 1703   HGB 13.1 08/30/2022 1042   HCT 39.0 08/30/2022 1042   PLT 296 08/30/2022 1042   MCV 96 08/30/2022 1042   MCH 32.3 08/30/2022 1042   MCH 30.3 08/18/2020 1703   MCHC 33.6 08/30/2022 1042   MCHC 33.0 08/18/2020 1703   RDW 13.7 08/30/2022 1042  LYMPHSABS 1.0 08/30/2022 1042   MONOABS 0.6 02/10/2018 1741   EOSABS 0.2 08/30/2022 1042   BASOSABS 0.1 08/30/2022 1042    ASSESSMENT AND PLAN:  1. Encounter for weight management Patient is doing well on Wegovy without significant side effects.  I recommend that we continue the same dose rather than increase the dose.  Went over possible side effects of the medications.  Advised to stop the medicine and call us if she develops any vomiting or abdominal pain as this can be indicative of pancreatitis or bowel blockage.  Encourage healthy eating habits.  She will continue regular exercise 3 times a week. - Semaglutide-Weight Management 0.5 MG/0.5ML SOAJ; Inject 0.5 mg into the skin once a week.  Dispense: 2 mL; Refill: 2  2. Essential hypertension Not at  goal. Continue carvedilol 25 mg twice a day and Cozaar 100 mg daily.  Add low-dose Norvasc 5 mg daily.  Encourage DASH diet.  Follow-up with clinical pharmacist in 3 weeks for repeat check. - amLODipine (NORVASC) 5 MG tablet; Take 1 tablet (5 mg total) by mouth daily.  Dispense: 90 tablet; Refill: 1  3. Coronary artery disease involving native coronary artery of native heart without angina pectoris Stable.  Continue Crestor, carvedilol, Repatha and aspirin  4. Recurrent major depressive disorder, in full remission (HCC) Stable and in remission with Effexor.    Patient was given the opportunity to ask questions.  Patient verbalized understanding of the plan and was.  Continue Crestor to repeat key elements of the plan.   This documentation was completed using Paediatric nurse.  Any transcriptional errors are unintentional.  Orders Placed This Encounter  Procedures   Varicella-zoster vaccine IM     Requested Prescriptions   Signed Prescriptions Disp Refills   Semaglutide-Weight Management 0.5 MG/0.5ML SOAJ 2 mL 2    Sig: Inject 0.5 mg into the skin once a week.   amLODipine (NORVASC) 5 MG tablet 90 tablet 1    Sig: Take 1 tablet (5 mg total) by mouth daily.    Return in about 4 months (around 04/12/2023) for Appt with [facility] in 3 wks for BP check.  Jonah Blue, MD, FACP

## 2022-12-11 ENCOUNTER — Other Ambulatory Visit (HOSPITAL_COMMUNITY): Payer: Self-pay

## 2022-12-13 ENCOUNTER — Other Ambulatory Visit: Payer: Self-pay

## 2022-12-30 ENCOUNTER — Other Ambulatory Visit: Payer: Self-pay

## 2022-12-31 ENCOUNTER — Ambulatory Visit: Payer: Medicaid Other | Attending: Internal Medicine | Admitting: Pharmacist

## 2022-12-31 ENCOUNTER — Encounter: Payer: Self-pay | Admitting: Pharmacist

## 2022-12-31 VITALS — BP 104/69 | HR 77

## 2022-12-31 DIAGNOSIS — I10 Essential (primary) hypertension: Secondary | ICD-10-CM | POA: Diagnosis not present

## 2022-12-31 NOTE — Progress Notes (Signed)
   S:     No chief complaint on file.  62 y.o. female who presents for hypertension evaluation, education, and management.  PMH is significant for HTN, CAD one stent to LAD 11/2017, depression, HL, LT thyroid nodule (no further f/u needed per endocrinology Dr. Lonzo Cloud), RT lung nodule (stable in size on CT 07/2020) .  Patient was referred and last seen by Primary Care Provider, Dr. Laural Benes, on 12/10/22. BP at that visit was 138/96 mmHg. Dr. Laural Benes added amlodipine.   Today, patient arrives in good spirits and presents without assistance. Denies dizziness, headache, blurred vision, swelling. Patient reports hypertension is longstanding.    Family/Social history:  -Fhx: CAD, heart disease, HLD -Tobacco: never smoker  -Alcohol: none reported   Medication adherence reported. Patient has taken BP medications today.   Current antihypertensives include: amlodipine 5 mg daily, carvedilol 25 mg BID, losartan 100 mg daily  Reported home BP readings: since adding amlodipine, she has 1 home reading (a coupld of days after her last PCP visit) where BP is >130/80 mmHh. Since then, highest SBP is 135 mmHg and highest DBP is 82 mmHg.   Patient reported dietary habits:  -Mostly compliant with sodium restriction -Denies excessive caffeine intake   Patient-reported exercise habits: [employer] earlier in the summer. Going 3x/wk - walks 1/2 mile,then 10-15 mins on stationary bike then light wgh machines.   O:  Vitals:   12/31/22 1002  BP: 104/69  Pulse: 77   Last 3 Office BP readings: BP Readings from Last 3 Encounters:  12/31/22 104/69  12/10/22 (!) 138/96  10/13/22 120/81    BMET    Component Value Date/Time   NA 143 08/19/2022 0850   K 4.7 08/19/2022 0850   CL 104 08/19/2022 0850   CO2 23 08/19/2022 0850   GLUCOSE 107 (H) 08/19/2022 0850   GLUCOSE 130 (H) 08/18/2020 1703   BUN 27 08/19/2022 0850   CREATININE 0.78 08/19/2022 0850   CALCIUM 10.0 08/19/2022 0850   GFRNONAA  42 (L) 08/18/2020 1703   GFRAA 109 05/21/2020 0844    Renal function: CrCl cannot be calculated (Patient's most recent lab result is older than the maximum 21 days allowed.).  Clinical ASCVD: Yes  The ASCVD Risk score (Arnett DK, et al., 2019) failed to calculate for the following reasons:   The patient has a prior MI or stroke diagnosis  Patient is participating in a Managed Medicaid Plan:  Yes    A/P: Hypertension diagnosed currently controlled on current medications. BP goal < 130/80 mmHg. Medication adherence appears appropriate.  -Continue current regimen.  -Counseled on lifestyle modifications for blood pressure control including reduced dietary sodium, increased exercise, adequate sleep. -Encouraged patient to check BP at home and bring log of readings to next visit. Counseled on proper use of home BP cuff.   Results reviewed and written information provided.    Written patient instructions provided. Patient verbalized understanding of treatment plan.  Total time in face to face counseling 15 minutes.    Follow-up:  Pharmacist prn. PCP clinic visit in 04/12/2023.   Butch Penny, PharmD, Patsy [place], CPP Clinical Pharmacist [facility] [phone]

## 2023-01-05 ENCOUNTER — Other Ambulatory Visit: Payer: Self-pay

## 2023-01-05 ENCOUNTER — Other Ambulatory Visit (HOSPITAL_COMMUNITY): Payer: Self-pay

## 2023-01-28 ENCOUNTER — Other Ambulatory Visit: Payer: Self-pay

## 2023-01-28 ENCOUNTER — Other Ambulatory Visit (HOSPITAL_COMMUNITY): Payer: Self-pay

## 2023-02-01 ENCOUNTER — Other Ambulatory Visit (HOSPITAL_COMMUNITY): Payer: Self-pay

## 2023-02-02 ENCOUNTER — Other Ambulatory Visit (HOSPITAL_COMMUNITY): Payer: Self-pay

## 2023-02-09 ENCOUNTER — Ambulatory Visit: Payer: Medicaid Other | Admitting: Physician Assistant

## 2023-02-10 ENCOUNTER — Ambulatory Visit: Payer: Medicaid Other | Admitting: Critical Care Medicine

## 2023-02-23 ENCOUNTER — Other Ambulatory Visit: Payer: Self-pay

## 2023-02-24 ENCOUNTER — Other Ambulatory Visit: Payer: Self-pay

## 2023-02-26 ENCOUNTER — Other Ambulatory Visit (HOSPITAL_COMMUNITY): Payer: Self-pay

## 2023-03-01 ENCOUNTER — Other Ambulatory Visit: Payer: Self-pay | Admitting: Internal Medicine

## 2023-03-01 DIAGNOSIS — Z7689 Persons encountering health services in other specified circumstances: Secondary | ICD-10-CM

## 2023-03-02 ENCOUNTER — Other Ambulatory Visit (HOSPITAL_COMMUNITY): Payer: Self-pay

## 2023-03-02 ENCOUNTER — Other Ambulatory Visit: Payer: Self-pay

## 2023-03-02 MED ORDER — WEGOVY 0.5 MG/0.5ML ~~LOC~~ SOAJ
0.5000 mg | SUBCUTANEOUS | 2 refills | Status: DC
Start: 2023-03-02 — End: 2023-06-03
  Filled 2023-03-02: qty 2, 28d supply, fill #0
  Filled 2023-03-30: qty 2, 28d supply, fill #1
  Filled 2023-04-27: qty 2, 28d supply, fill #2

## 2023-03-02 NOTE — Telephone Encounter (Signed)
Requested Prescriptions  Pending Prescriptions Disp Refills   Semaglutide-Weight Management (WEGOVY) 0.5 MG/0.5ML SOAJ 2 mL 2    Sig: Inject 0.5 mg into the skin once a week.     Endocrinology:  Diabetes - GLP-1 Receptor Agonists - semaglutide Failed - 03/01/2023  9:23 PM      Failed - HBA1C in normal range and within 180 days    Hgb A1c MFr Bld  Date Value Ref Range Status  11/04/2020 6.0 (H) 4.8 - 5.6 % Final    Comment:             Prediabetes: 5.7 - 6.4          Diabetes: >6.4          Glycemic control for adults with diabetes: <7.0          Passed - Cr in normal range and within 360 days    Creatinine, Ser  Date Value Ref Range Status  08/19/2022 0.78 0.57 - 1.00 mg/dL Final         Passed - Valid encounter within last 6 months    Recent Outpatient Visits           2 months ago Essential hypertension   [facility] [place], Cornelius Moras, RPH-CPP   2 months ago Encounter for [employer] management   [place] [facility] Marcine Matar, MD   4 months ago Prediabetes   [facility], Marzella Schlein, [place]   1 year ago Essential hypertension   [place] [facility] Marcine Matar, MD   1 year ago Essential hypertension   [place] [facility] Marcine Matar, MD       Future Appointments             In 1 month Laural Benes, Binnie Rail, MD [facility]

## 2023-03-04 ENCOUNTER — Other Ambulatory Visit: Payer: Self-pay

## 2023-03-07 ENCOUNTER — Ambulatory Visit: Payer: Medicaid Other | Admitting: Internal Medicine

## 2023-03-08 ENCOUNTER — Other Ambulatory Visit (HOSPITAL_COMMUNITY): Payer: Self-pay

## 2023-03-24 ENCOUNTER — Other Ambulatory Visit: Payer: Self-pay

## 2023-03-26 ENCOUNTER — Other Ambulatory Visit: Payer: Self-pay | Admitting: Physician Assistant

## 2023-03-26 DIAGNOSIS — F3342 Major depressive disorder, recurrent, in full remission: Secondary | ICD-10-CM

## 2023-03-28 ENCOUNTER — Other Ambulatory Visit: Payer: Self-pay

## 2023-03-28 ENCOUNTER — Other Ambulatory Visit (HOSPITAL_COMMUNITY): Payer: Self-pay

## 2023-03-28 MED ORDER — VENLAFAXINE HCL ER 75 MG PO CP24
75.0000 mg | ORAL_CAPSULE | Freq: Every day | ORAL | 5 refills | Status: DC
Start: 2023-03-28 — End: 2023-09-29
  Filled 2023-03-28: qty 30, 30d supply, fill #0
  Filled 2023-04-27: qty 30, 30d supply, fill #1
  Filled 2023-05-24: qty 30, 30d supply, fill #2
  Filled 2023-06-21: qty 30, 30d supply, fill #3
  Filled 2023-07-21: qty 30, 30d supply, fill #4
  Filled 2023-08-19: qty 30, 30d supply, fill #5

## 2023-03-30 ENCOUNTER — Other Ambulatory Visit: Payer: Self-pay

## 2023-04-05 DIAGNOSIS — M419 Scoliosis, unspecified: Secondary | ICD-10-CM | POA: Diagnosis not present

## 2023-04-05 DIAGNOSIS — M5451 Vertebrogenic low back pain: Secondary | ICD-10-CM | POA: Diagnosis not present

## 2023-04-06 ENCOUNTER — Other Ambulatory Visit: Payer: Self-pay

## 2023-04-06 ENCOUNTER — Telehealth: Payer: Self-pay | Admitting: Internal Medicine

## 2023-04-06 MED ORDER — NITROGLYCERIN 0.4 MG SL SUBL
0.4000 mg | SUBLINGUAL_TABLET | SUBLINGUAL | 3 refills | Status: AC | PRN
  Filled 2023-04-06: qty 25, 9d supply, fill #0
  Filled 2023-12-26: qty 25, 9d supply, fill #1

## 2023-04-06 NOTE — Telephone Encounter (Signed)
  Per MyChart scheduling message:  Initial complaint: Recent chest pains    Pt c/o of Chest Pain: STAT if active CP, including tightness, pressure, jaw pain, radiating pain to shoulder/upper arm/back, CP unrelieved by Nitro. Symptoms reported of SOB, nausea, vomiting, sweating.  1. Are you having CP right now?    2. Are you experiencing any other symptoms (ex. SOB, nausea, vomiting, sweating)?   3. Is your CP continuous or coming and going?   4. Have you taken Nitroglycerin?   5. How long have you been experiencing CP?   6. If NO CP at time of call then end call with telling Pt to call back or call 911 if Chest pain returns prior to return call from triage team.   Now that I'm out of bed I am having pain in my chest. Shortness of breath and nausea   My nitro is expired otherwise I would have take some. Took some aspirin    About a week

## 2023-04-06 NOTE — Telephone Encounter (Signed)
Pt to see Dr Tenny Craw 04/08/23.

## 2023-04-06 NOTE — Telephone Encounter (Signed)
OK to refill NTG. See if can come in for appt

## 2023-04-06 NOTE — Telephone Encounter (Signed)
Spoke with patient and she states she has been having really bad heart burn these last couple of days and it started to feel like chest pain but is not sure if it was just heart burn or she was really having chest pain. She was going to take nitroglycerin but it has expired. She would like a new Rx for her nitroglycerin. She is currently asymptomatic besides having back pain. ED precautions discussed. Is it ok to send refill for nitroglycerin?

## 2023-04-07 ENCOUNTER — Other Ambulatory Visit: Payer: Self-pay

## 2023-04-07 NOTE — Progress Notes (Deleted)
Cardiology Office Note:    Date:  04/07/2023   ID:  Monique Hunt, DOB 1961-03-13, MRN [id number]  PCP:  Marcine Matar, MD  [facility] Providers Cardiologist:  Dietrich Pates, MD Cardiology APP:  Kennon Rounds     Referring MD: Marcine Matar, MD   Chief Complaint:  No chief complaint on file.    Patient Profile: Coronary artery disease w/ angina S/p NSTEMI 7/19 >> PCI: DES to pLAD ETT-Echocardiogram 07/2018: no ischemia Cath Jan 16, 2023:  oLAD 40, mLAD stent patent Rx with Isosorbide  Hypertension  Hyperlipidemia  Aortic atherosclerosis     Prior CV studies: Cardiac catheterization January 16, 2020 LAD ostial 40, mid stent patent EF 55-65   Stress echocardiogram 07/10/2018 No echocardiographic evidence for stress-induced ischemia No LV outflow tract obstruction at baseline or after exercise   Cardiac catheterization 12/16/2017 LAD proximal 75 EF 55-65 12/19/2017: PCI -3.5 x 12 mm Synergy DES to the LAD  History of Present Illness:   Monique Hunt is a 62 y.o. female with a hx of CAD, HTN, HL   She was last seen in cardiology clinic by Wende Mott in March 2023   Since seen the pt says  she  felt good until a couple months ago    Had been walking   Lost weight  NOw tired   Has stomach aches    Chore to take shower this morning   CHore to do meal prep   Gets out of breath     Problmes with stomach  Diarrhea every day Liquid   Will go 4 to 8 x per day   Pt denies CP   Did have pain in her L arm, not associated with activity    I saw the pt in April 2024  She has called in recently with chest pain    Past Medical History:  Diagnosis Date   Coronary artery disease    Depression    History of kidney stones    Hyperlipidemia    Hypertension    Migraine    "stopped ~ 2010" (12/15/2017)   NSTEMI (non-ST elevated myocardial infarction) (HCC) 12/15/2017   Current Medications: No outpatient medications have been marked as taking for the 04/08/23 encounter  (Appointment) with Pricilla Riffle, MD.    Allergies:   Patient has no known allergies.   Social History   Tobacco Use   Smoking status: Never   Smokeless tobacco: Never  Vaping Use   Vaping status: Never Used  Substance Use Topics   Alcohol use: Yes    Comment: 3 TIMES A WEEK   Drug use: Never    Family Hx: The patient's family history includes Breast cancer in her mother; CAD in her father; Heart disease in her father; Hyperlipidemia in her brother and sister. There is no history of Colon cancer, Esophageal cancer, Rectal cancer, or Stomach cancer.  Review of Systems  Cardiovascular:  Negative for claudication.     EKGs/Labs/Other Test Reviewed:    EKG:  EKG is   ordered today.  SR 80 bpm   LVH  Q wave in III   Recent Labs: 08/19/2022: ALT 41; BUN 27; Creatinine, Ser 0.78; Potassium 4.7; Sodium 143 08/30/2022: Hemoglobin 13.1; Platelets 296; TSH 1.030   Recent Lipid Panel Recent Labs    08/19/22 0850  CHOL 108  TRIG 184*  HDL 72  LDLCALC 8       Physical Exam:    VS:  LMP 02/28/2011  Neck:   JVP is normal Lungs are CTA Cardiac RRR  No murmurs   No S3 Ext are without edema    Wt Readings from Last 3 Encounters:  12/10/22 139 lb 9.6 oz (63.3 kg)  10/13/22 157 lb 9.6 oz (71.5 kg)  09/22/22 160 lb (72.6 kg)       ASSESSMENT & PLAN:    1  Fatigue/dyspnea    Concerning    Pt with diarrhea which may be contributing   Her BP is a little low today   ? If this could be worsening CAD  Will check CBC and thyroid  Cut back on amlodipine   See if, with boost in BP she feels better     Will need close follow up to see how she is feeling        2   CAD   PT with intervention in 2019   Repeat cath in 2021 showed patient stent to LAD  3  HTN   As noted above   4   HL   Pt on Repatha  LDL 8 on 07/2022               Dispo:  No follow-ups on file.   Medication Adjustments/Labs and Tests Ordered: Current medicines are reviewed at length with the patient today.   Concerns regarding medicines are outlined above.  Tests Ordered: No orders of the defined types were placed in this encounter.  Medication Changes: No orders of the defined types were placed in this encounter.  Signed, Dietrich Pates, MD  04/07/2023 5:54 PM    [facility] [address] [place], [place], [place]  [address] Phone: [phone]; Fax: [phone]

## 2023-04-08 ENCOUNTER — Ambulatory Visit: Payer: Medicaid Other | Admitting: Internal Medicine

## 2023-04-08 NOTE — Telephone Encounter (Signed)
Pt canceled her OV with Dr Tenny Craw today.. when I made the appt she asked if she could cancel if she was feeling better.. pt has 05/2023 OV still planned.

## 2023-04-11 ENCOUNTER — Other Ambulatory Visit: Payer: Self-pay

## 2023-04-12 ENCOUNTER — Ambulatory Visit: Payer: Medicaid Other | Admitting: Internal Medicine

## 2023-04-21 ENCOUNTER — Other Ambulatory Visit: Payer: Self-pay

## 2023-04-22 ENCOUNTER — Other Ambulatory Visit: Payer: Self-pay | Admitting: Physician Assistant

## 2023-04-22 ENCOUNTER — Other Ambulatory Visit: Payer: Self-pay

## 2023-04-22 DIAGNOSIS — G43009 Migraine without aura, not intractable, without status migrainosus: Secondary | ICD-10-CM

## 2023-04-22 MED ORDER — TOPIRAMATE 50 MG PO TABS
25.0000 mg | ORAL_TABLET | Freq: Every day | ORAL | 2 refills | Status: DC
Start: 2023-04-22 — End: 2023-11-05
  Filled 2023-04-22: qty 30, 60d supply, fill #0
  Filled 2023-06-23: qty 30, 60d supply, fill #1
  Filled 2023-08-19: qty 30, 60d supply, fill #2

## 2023-04-26 ENCOUNTER — Other Ambulatory Visit (HOSPITAL_COMMUNITY): Payer: Self-pay

## 2023-04-26 ENCOUNTER — Other Ambulatory Visit: Payer: Self-pay | Admitting: Physician Assistant

## 2023-04-26 ENCOUNTER — Other Ambulatory Visit: Payer: Self-pay

## 2023-04-26 DIAGNOSIS — I10 Essential (primary) hypertension: Secondary | ICD-10-CM

## 2023-04-26 MED ORDER — CARVEDILOL 25 MG PO TABS
25.0000 mg | ORAL_TABLET | Freq: Two times a day (BID) | ORAL | 0 refills | Status: DC
Start: 2023-04-26 — End: 2023-07-25
  Filled 2023-04-26: qty 180, 90d supply, fill #0

## 2023-04-27 ENCOUNTER — Other Ambulatory Visit (HOSPITAL_COMMUNITY): Payer: Self-pay

## 2023-05-05 DIAGNOSIS — M4150 Other secondary scoliosis, site unspecified: Secondary | ICD-10-CM | POA: Diagnosis not present

## 2023-05-09 ENCOUNTER — Other Ambulatory Visit (HOSPITAL_COMMUNITY): Payer: Self-pay | Admitting: *Deleted

## 2023-05-09 DIAGNOSIS — M4150 Other secondary scoliosis, site unspecified: Secondary | ICD-10-CM

## 2023-05-14 ENCOUNTER — Other Ambulatory Visit (HOSPITAL_COMMUNITY): Payer: Self-pay

## 2023-05-17 ENCOUNTER — Ambulatory Visit (HOSPITAL_COMMUNITY)
Admission: RE | Admit: 2023-05-17 | Discharge: 2023-05-17 | Disposition: A | Discharge status: Home / Self Care | Payer: Medicaid Other | Source: Ambulatory Visit | Attending: *Deleted | Admitting: *Deleted

## 2023-05-17 DIAGNOSIS — M419 Scoliosis, unspecified: Secondary | ICD-10-CM | POA: Insufficient documentation

## 2023-05-17 DIAGNOSIS — M5124 Other intervertebral disc displacement, thoracic region: Secondary | ICD-10-CM | POA: Diagnosis not present

## 2023-05-17 DIAGNOSIS — M4316 Spondylolisthesis, lumbar region: Secondary | ICD-10-CM | POA: Diagnosis not present

## 2023-05-17 DIAGNOSIS — M4187 Other forms of scoliosis, lumbosacral region: Secondary | ICD-10-CM | POA: Diagnosis not present

## 2023-05-17 DIAGNOSIS — M4185 Other forms of scoliosis, thoracolumbar region: Secondary | ICD-10-CM | POA: Diagnosis not present

## 2023-05-17 DIAGNOSIS — M4150 Other secondary scoliosis, site unspecified: Secondary | ICD-10-CM | POA: Insufficient documentation

## 2023-05-24 ENCOUNTER — Other Ambulatory Visit (HOSPITAL_COMMUNITY): Payer: Self-pay

## 2023-05-24 ENCOUNTER — Ambulatory Visit: Payer: Medicaid Other | Attending: Internal Medicine | Admitting: Internal Medicine

## 2023-05-26 ENCOUNTER — Encounter: Payer: Self-pay | Admitting: Internal Medicine

## 2023-05-30 ENCOUNTER — Other Ambulatory Visit: Payer: Self-pay

## 2023-06-03 ENCOUNTER — Other Ambulatory Visit: Payer: Self-pay | Admitting: Internal Medicine

## 2023-06-03 DIAGNOSIS — Z7689 Persons encountering health services in other specified circumstances: Secondary | ICD-10-CM

## 2023-06-03 MED ORDER — WEGOVY 0.5 MG/0.5ML ~~LOC~~ SOAJ
0.5000 mg | SUBCUTANEOUS | 0 refills | Status: DC
Start: 2023-06-03 — End: 2023-07-04
  Filled 2023-06-03: qty 2, 28d supply, fill #0

## 2023-06-04 ENCOUNTER — Other Ambulatory Visit: Payer: Self-pay | Admitting: Internal Medicine

## 2023-06-04 ENCOUNTER — Other Ambulatory Visit (HOSPITAL_COMMUNITY): Payer: Self-pay

## 2023-06-04 DIAGNOSIS — I10 Essential (primary) hypertension: Secondary | ICD-10-CM

## 2023-06-05 ENCOUNTER — Other Ambulatory Visit (HOSPITAL_COMMUNITY): Payer: Self-pay

## 2023-06-05 MED ORDER — AMLODIPINE BESYLATE 5 MG PO TABS
5.0000 mg | ORAL_TABLET | Freq: Every day | ORAL | 0 refills | Status: DC
Start: 2023-06-05 — End: 2023-09-29
  Filled 2023-06-05: qty 90, 90d supply, fill #0

## 2023-06-06 ENCOUNTER — Other Ambulatory Visit (HOSPITAL_COMMUNITY): Payer: Self-pay

## 2023-06-11 ENCOUNTER — Other Ambulatory Visit (HOSPITAL_COMMUNITY): Payer: Self-pay

## 2023-06-14 DIAGNOSIS — M4150 Other secondary scoliosis, site unspecified: Secondary | ICD-10-CM | POA: Diagnosis not present

## 2023-06-20 ENCOUNTER — Ambulatory Visit: Payer: Medicaid Other | Admitting: Internal Medicine

## 2023-06-21 ENCOUNTER — Other Ambulatory Visit: Payer: Self-pay

## 2023-06-22 DIAGNOSIS — M4150 Other secondary scoliosis, site unspecified: Secondary | ICD-10-CM | POA: Diagnosis not present

## 2023-06-23 ENCOUNTER — Other Ambulatory Visit: Payer: Self-pay

## 2023-07-04 ENCOUNTER — Other Ambulatory Visit: Payer: Self-pay | Admitting: Internal Medicine

## 2023-07-04 ENCOUNTER — Other Ambulatory Visit (HOSPITAL_COMMUNITY): Payer: Self-pay

## 2023-07-04 ENCOUNTER — Other Ambulatory Visit: Payer: Self-pay

## 2023-07-04 DIAGNOSIS — Z7689 Persons encountering health services in other specified circumstances: Secondary | ICD-10-CM

## 2023-07-04 DIAGNOSIS — M4150 Other secondary scoliosis, site unspecified: Secondary | ICD-10-CM | POA: Diagnosis not present

## 2023-07-04 MED ORDER — WEGOVY 0.5 MG/0.5ML ~~LOC~~ SOAJ
0.5000 mg | SUBCUTANEOUS | 0 refills | Status: DC
Start: 2023-07-04 — End: 2023-07-31
  Filled 2023-07-04: qty 2, 28d supply, fill #0

## 2023-07-06 ENCOUNTER — Other Ambulatory Visit: Payer: Self-pay

## 2023-07-06 ENCOUNTER — Other Ambulatory Visit (HOSPITAL_COMMUNITY): Payer: Self-pay

## 2023-07-06 DIAGNOSIS — M4150 Other secondary scoliosis, site unspecified: Secondary | ICD-10-CM | POA: Diagnosis not present

## 2023-07-07 ENCOUNTER — Telehealth: Payer: Self-pay

## 2023-07-07 ENCOUNTER — Other Ambulatory Visit (HOSPITAL_COMMUNITY): Payer: Self-pay

## 2023-07-07 DIAGNOSIS — M4125 Other idiopathic scoliosis, thoracolumbar region: Secondary | ICD-10-CM | POA: Diagnosis not present

## 2023-07-07 DIAGNOSIS — M4150 Other secondary scoliosis, site unspecified: Secondary | ICD-10-CM | POA: Diagnosis not present

## 2023-07-07 NOTE — Telephone Encounter (Signed)
 Pharmacy Patient Advocate Encounter   Received notification from CoverMyMeds that prior authorization for REPATHA  is required/requested.   Insurance verification completed.   The patient is insured through [facility] .   Per test claim: PA required; PA submitted to above mentioned insurance via CoverMyMeds Key/confirmation #/EOC [id number] Status is pending

## 2023-07-08 ENCOUNTER — Other Ambulatory Visit (HOSPITAL_COMMUNITY): Payer: Self-pay

## 2023-07-08 NOTE — Telephone Encounter (Signed)
 Pharmacy Patient Advocate Encounter  Received notification from [employer] that Prior Authorization for REPATHA  has been APPROVED from 07/07/23 to 07/06/24. Ran test claim, Copay is $4. This test claim was processed through [facility]- copay amounts may vary at other pharmacies due to pharmacy/plan contracts, or as the patient moves through the different stages of their insurance plan.

## 2023-07-16 ENCOUNTER — Other Ambulatory Visit (HOSPITAL_COMMUNITY): Payer: Self-pay

## 2023-07-17 NOTE — Progress Notes (Deleted)
 Cardiology Office Note:    Date:  07/17/2023   ID:  Monique Hunt, DOB Sep 05, 1960, MRN [id number]  PCP:  Marcine Matar, MD  [facility] Providers Cardiologist:  Dietrich Pates, MD Cardiology APP:  Kennon Rounds     Referring MD: Marcine Matar, MD   Chief Complaint:  No chief complaint on file.    Patient Profile: Coronary artery disease w/ angina S/p NSTEMI 7/19 >> PCI: DES to pLAD ETT-Echocardiogram 07/2018: no ischemia Cath 2023/12/20:  oLAD 40, mLAD stent patent Rx with Isosorbide  Hypertension  Hyperlipidemia  Aortic atherosclerosis     Prior CV studies: Cardiac catheterization 12-20-19 LAD ostial 40, mid stent patent EF 55-65   Stress echocardiogram 07/10/2018 No echocardiographic evidence for stress-induced ischemia No LV outflow tract obstruction at baseline or after exercise   Cardiac catheterization 12/16/2017 LAD proximal 75 EF 55-65 12/19/2017: PCI -3.5 x 12 mm Synergy DES to the LAD  History of Present Illness:   Monique Hunt is a 63 y.o. female with a hx of CAD, HTN, HL   She was last seen in cardiology clinic by Wende Mott in March 2023   Since seen the pt says  she  felt good until a couple months ago    Had been walking   Lost weight  NOw tired   Has stomach aches    Chore to take shower this morning   CHore to do meal prep   Gets out of breath     Problmes with stomach  Diarrhea every day Liquid   Will go 4 to 8 x per day   Pt denies CP   Did have pain in her L arm, not associated with activity    I saw the pt in Spring 2024 Past Medical History:  Diagnosis Date   Coronary artery disease    Depression    History of kidney stones    Hyperlipidemia    Hypertension    Migraine    "stopped ~ 2010" (12/15/2017)   NSTEMI (non-ST elevated myocardial infarction) (HCC) 12/15/2017   Current Medications: No outpatient medications have been marked as taking for the 07/18/23 encounter (Appointment) with Pricilla Riffle, MD.     Allergies:   Patient has no known allergies.   Social History   Tobacco Use   Smoking status: Never   Smokeless tobacco: Never  Vaping Use   Vaping status: Never Used  Substance Use Topics   Alcohol use: Yes    Comment: 3 TIMES A WEEK   Drug use: Never    Family Hx: The patient's family history includes Breast cancer in her mother; CAD in her father; Heart disease in her father; Hyperlipidemia in her brother and sister. There is no history of Colon cancer, Esophageal cancer, Rectal cancer, or Stomach cancer.  Review of Systems  Cardiovascular:  Negative for claudication.     EKGs/Labs/Other Test Reviewed:    EKG:  EKG is   ordered today.  SR 80 bpm   LVH  Q wave in III   Recent Labs: 08/19/2022: ALT 41; BUN 27; Creatinine, Ser 0.78; Potassium 4.7; Sodium 143 08/30/2022: Hemoglobin 13.1; Platelets 296; TSH 1.030   Recent Lipid Panel Recent Labs    08/19/22 0850  CHOL 108  TRIG 184*  HDL 72  LDLCALC 8       Physical Exam:    VS:  LMP 02/28/2011     Neck:   JVP is normal Lungs are CTA Cardiac  RRR  No murmurs   No S3 Ext are without edema    Wt Readings from Last 3 Encounters:  12/10/22 139 lb 9.6 oz (63.3 kg)  10/13/22 157 lb 9.6 oz (71.5 kg)  09/22/22 160 lb (72.6 kg)       ASSESSMENT & PLAN:    1  Fatigue/dyspnea    Concerning    Pt with diarrhea which may be contributing   Her BP is a little low today   ? If this could be worsening CAD  Will check CBC and thyroid  Cut back on amlodipine   See if, with boost in BP she feels better     Will need close follow up to see how she is feeling        2   CAD   PT with intervention in 2019   Repeat cath in 2021 showed patient stent to LAD  3  HTN   As noted above   4   HL   Pt on Repatha  LDL 8 on 07/2022               Dispo:  No follow-ups on file.   Medication Adjustments/Labs and Tests Ordered: Current medicines are reviewed at length with the patient today.  Concerns regarding medicines are outlined  above.  Tests Ordered: No orders of the defined types were placed in this encounter.  Medication Changes: No orders of the defined types were placed in this encounter.  Signed, Dietrich Pates, MD  07/17/2023 2:24 PM    [facility] [address] [place], [place], [place]  [address] Phone: [phone]; Fax: [phone]

## 2023-07-18 ENCOUNTER — Ambulatory Visit: Payer: Medicaid Other | Admitting: Internal Medicine

## 2023-07-18 NOTE — Progress Notes (Unsigned)
 Cardiology Office Note:  .   Date:  07/20/2023  ID:  Monique Hunt, DOB 11/27/1960, MRN [id number] PCP: Monique Matar, MD  [facility] Providers Cardiologist:  Monique Pates, MD Cardiology APP:  Monique Lecher, PA-C    Patient Profile: .      PMH Coronary artery disease S/p NSTEMI 11/2017>>PCI: DES to pLAD Stress echo 07/2018: no ischemia Cath 11/2019: oLAD 40, mLAD stent patent Rx with isosorbide Hypertension Hyperlipidemia Aortic atherosclerosis  She established with cardiology in 2019. History of coronary artery disease as outlined above. She has maintained consistent follow-up.  Last cardiology clinic visit was 08/30/2022 with Dr. Tenny Hunt. She was having chronic diarrhea  4-8 stools per day. She was feeling fatigued and giving out of breath with simple tasks such as preparing meals, showering and walking. Amlodipine was discontinued 2/2 soft BP. CBC and TSH obtained that day were unremarkable. She denied s/s of angina.   She contacted our office 04/06/23 to report chest pain, SOB and nausea. Her SL NTG rx had expired. She was scheduled for an office visit but cancelled it.        History of Present Illness: .   Monique Hunt is a very pleasant 63 y.o. female who is here today for follow-up of CAD. She reports recent episodes of chest pain and fatigue. She describes the chest pain as similar to her previous heart attack, originating in the middle of her back. Pain was relieved with nitroglycerin, which she has needed to use about four times in the last three months. Episodes of chest pain typically occur during light activity, such as household chores. She also reports feeling exhausted, with even a short walk to her mailbox causing her to breathe more heavily which her husband has noted. She also reports feeling her heart beating fast at times and occasional episodes of dizziness upon standing. No low blood pressure readings at home < 100 mmHg systolic. She denies  concerns for bleeding, orthopnea, PND, presyncope or syncope. Cholesterol has been well controlled with Repatha with last LDL 38. She is tolerating Repatha without any concerning side effects.  We discussed previous anti-anginal therapy including isosorbide. She is unsure why she stopped it but does not recall a particular intolerance.  Discussed the use of AI scribe software for clinical note transcription with the patient, who gave verbal consent to proceed.   ROS: See HPI       Studies Reviewed: Marland Kitchen   EKG Interpretation Date/Time:  Tuesday July 19 2023 40:98:11 EST Ventricular Rate:  80 PR Interval:  156 QRS Duration:  78 QT Interval:  356 QTC Calculation: 410 R Axis:   -10  Text Interpretation: Normal sinus rhythm Anterior infarct , age undetermined Nonspecific ST abnormality Confirmed by Monique Hunt [phone]) on 07/19/2023 4:31:51 PM    Risk Assessment/Calculations:             Physical Exam:   VS:  BP 109/62   Pulse 78   Ht 5\' 5"  (1.651 m)   Wt 134 lb 6.4 oz (61 kg)   LMP 02/28/2011   SpO2 96%   BMI 22.37 kg/m    Wt Readings from Last 3 Encounters:  07/19/23 134 lb 6.4 oz (61 kg)  12/10/22 139 lb 9.6 oz (63.3 kg)  10/13/22 157 lb 9.6 oz (71.5 kg)    GEN: Well nourished, well developed in no acute distress NECK: No JVD; No carotid bruits CARDIAC: RRR, no murmurs, rubs, gallops RESPIRATORY:  Clear to  auscultation without rales, wheezing or rhonchi  ABDOMEN: Soft, non-tender, non-distended EXTREMITIES:  No edema; No deformity     ASSESSMENT AND PLAN: .    CAD with unstable angina: She is having chest discomfort, DOE, and fatigue which has increased over the previous 3 months, relieved with SL NTG.  She was previously on isosorbide but is unsure as to why this was discontinued, possibly due to soft BP. EKG reveals normal sinus rhythm at 80 bpm, possible prior anterior infarct. This is a new finding since previous EKG 08/2022.  We will reinitiate isosorbide at 15 mg  daily.  I have advised her to monitor BP and to be cautious of sudden movement as she may develop orthostatic hypotension. Encouraged good hydration. Let us know if you do not tolerate.  We will get cardiac PET/CT to evaluate for worsening ischemia.  Continue SL NTG as needed.  ER precautions advised. No bleeding concerns.  Continue Repatha, amlodipine, carvedilol, rosuvastatin, and losartan. Consider reducing losartan dose if she becomes orthostatic.  I will see her back for soon follow-up.  Hyperlipidemia LDL goal < 55: Lipid panel from 08/19/22 with LDL-C of 8, triglycerides 161. We will get updated lipid panel when she can return fasting.  No concerning side effects on Repatha and rosuvastatin.  Continue these medications.  Hypertension: BP is well controlled, somewhat soft. Occasional dizziness when going from sitting to standing. We are adding Imdur 15 mg due to worsening angina. Encouraged good hydration and caution with sudden movement.  Consider reducing losartan dose if she develops orthostatic hypotension.       Disposition:3 months with Dr. Tenny Hunt or me  Signed, Monique Bridegroom, NP-C

## 2023-07-19 ENCOUNTER — Other Ambulatory Visit: Payer: Self-pay

## 2023-07-19 ENCOUNTER — Ambulatory Visit: Payer: Medicaid Other | Attending: Nurse Practitioner | Admitting: Nurse Practitioner

## 2023-07-19 ENCOUNTER — Other Ambulatory Visit: Payer: Self-pay | Admitting: *Deleted

## 2023-07-19 ENCOUNTER — Encounter: Payer: Self-pay | Admitting: Nurse Practitioner

## 2023-07-19 VITALS — BP 109/62 | HR 78 | Ht 65.0 in | Wt 134.4 lb

## 2023-07-19 DIAGNOSIS — E785 Hyperlipidemia, unspecified: Secondary | ICD-10-CM | POA: Diagnosis not present

## 2023-07-19 DIAGNOSIS — I10 Essential (primary) hypertension: Secondary | ICD-10-CM

## 2023-07-19 DIAGNOSIS — I209 Angina pectoris, unspecified: Secondary | ICD-10-CM

## 2023-07-19 DIAGNOSIS — I25110 Atherosclerotic heart disease of native coronary artery with unstable angina pectoris: Secondary | ICD-10-CM | POA: Diagnosis not present

## 2023-07-19 DIAGNOSIS — I25119 Atherosclerotic heart disease of native coronary artery with unspecified angina pectoris: Secondary | ICD-10-CM

## 2023-07-19 MED ORDER — ISOSORBIDE MONONITRATE ER 30 MG PO TB24
15.0000 mg | ORAL_TABLET | Freq: Every day | ORAL | 3 refills | Status: DC
  Filled 2023-07-19: qty 45, 90d supply, fill #0

## 2023-07-19 NOTE — Patient Instructions (Signed)
 Medication Instructions:   START Imdur one half (0.5) tablet by mouth ( 15 mg) daily.   *If you need a refill on your cardiac medications before your next appointment, please call your pharmacy*   Lab Work:  Your physician recommends that you return for a FASTING lipid profile, fasting after midnight at your convenience  At any of the labs listed below. Paperwork given to pt today.       If you have labs (blood work) drawn today and your tests are completely normal, you will receive your results only by: MyChart Message (if you have MyChart) OR A paper copy in the mail If you have any lab test that is abnormal or we need to change your treatment, we will call you to review the results.   Testing/Procedures:    Please report to Radiology at the [facility] Main Entrance 30 minutes early for your test.  [address] [place], [place] [address]    How to Prepare for Your Cardiac PET/CT Stress Test:  Nothing to eat or drink, except water, 3 hours prior to arrival time.  NO caffeine/decaffeinated products, or chocolate 12 hours prior to arrival. (Please note decaffeinated beverages (teas/coffees) still contain caffeine).  If you have caffeine within 12 hours prior, the test will need to be rescheduled.  Medication instructions: Do not take nitrates (isosorbide mononitrate, Ranexa) the day before or day of test   You may take your remaining medications with water.  NO perfume, cologne or lotion on chest or abdomen area. FEMALES - Please avoid wearing dresses to this appointment.  Total time is 1 to 2 hours; you may want to bring reading material for the waiting time.  In preparation for your appointment, medication and supplies will be purchased.  Appointment availability is limited, so if you need to cancel or reschedule, please call the Radiology Department Scheduler at [phone] 24 hours in advance to avoid a cancellation fee of $100.00  What to Expect  When you Arrive:  Once you arrive and check in for your appointment, you will be taken to a preparation room within the Radiology Department.  A technologist or Nurse will obtain your medical history, verify that you are correctly prepped for the exam, and explain the procedure.  Afterwards, an IV will be started in your arm and electrodes will be placed on your skin for EKG monitoring during the stress portion of the exam. Then you will be escorted to the PET/CT scanner.  There, staff will get you positioned on the scanner and obtain a blood pressure and EKG.  During the exam, you will continue to be connected to the EKG and blood pressure machines.  A small, safe amount of a radioactive tracer will be injected in your IV to obtain a series of pictures of your heart along with an injection of a stress agent.    After your Exam:  It is recommended that you eat a meal and drink a caffeinated beverage to counter act any effects of the stress agent.  Drink plenty of fluids for the remainder of the day and urinate frequently for the first couple of hours after the exam.  Your doctor will inform you of your test results within 7-10 business days.  For more information and frequently asked questions, please visit our website: [email or web address]  For questions about your test or how to prepare for your test, please call: Cardiac Imaging Nurse Navigators Office: [phone]    Follow-Up: At [facility], you and your health needs are our priority.  As part of our continuing mission to provide you with exceptional heart care, we have created designated Provider Care Teams.  These Care Teams include your primary Cardiologist (physician) and Advanced Practice Providers (APPs -  Physician Assistants and Nurse Practitioners) who all work together to provide you with the care you need, when you need it.  We recommend signing up for the patient portal called "MyChart".  Sign up  information is provided on this After Visit Summary.  MyChart is used to connect with patients for Virtual Visits (Telemedicine).  Patients are able to view lab/test results, encounter notes, upcoming appointments, etc.  Non-urgent messages can be sent to your provider as well.   To learn more about what you can do with MyChart, go to [email or web address].    Your next appointment:   2 month(s)  Provider:   Eligha Bridegroom, NP

## 2023-07-20 ENCOUNTER — Encounter: Payer: Self-pay | Admitting: Nurse Practitioner

## 2023-07-21 ENCOUNTER — Other Ambulatory Visit (HOSPITAL_COMMUNITY): Payer: Self-pay

## 2023-07-21 DIAGNOSIS — E785 Hyperlipidemia, unspecified: Secondary | ICD-10-CM | POA: Diagnosis not present

## 2023-07-21 DIAGNOSIS — I25119 Atherosclerotic heart disease of native coronary artery with unspecified angina pectoris: Secondary | ICD-10-CM | POA: Diagnosis not present

## 2023-07-21 DIAGNOSIS — I209 Angina pectoris, unspecified: Secondary | ICD-10-CM | POA: Diagnosis not present

## 2023-07-22 LAB — LIPID PANEL
Chol/HDL Ratio: 1.5 {ratio} (ref 0.0–4.4)
Cholesterol, Total: 115 mg/dL (ref 100–199)
HDL: 75 mg/dL (ref >39)
LDL Chol Calc (NIH): 14 mg/dL (ref 0–99)
Triglycerides: 166 mg/dL — ABNORMAL HIGH (ref 0–149)
VLDL Cholesterol Cal: 26 mg/dL (ref 5–40)

## 2023-07-25 ENCOUNTER — Other Ambulatory Visit: Payer: Self-pay | Admitting: Internal Medicine

## 2023-07-25 DIAGNOSIS — I10 Essential (primary) hypertension: Secondary | ICD-10-CM

## 2023-07-26 ENCOUNTER — Other Ambulatory Visit (HOSPITAL_COMMUNITY): Payer: Self-pay

## 2023-07-26 ENCOUNTER — Other Ambulatory Visit: Payer: Self-pay

## 2023-07-26 MED ORDER — CARVEDILOL 25 MG PO TABS
25.0000 mg | ORAL_TABLET | Freq: Two times a day (BID) | ORAL | 0 refills | Status: DC
Start: 2023-07-26 — End: 2023-08-19
  Filled 2023-07-26: qty 60, 30d supply, fill #0

## 2023-07-31 ENCOUNTER — Encounter: Payer: Self-pay | Admitting: Internal Medicine

## 2023-07-31 ENCOUNTER — Other Ambulatory Visit: Payer: Self-pay | Admitting: Internal Medicine

## 2023-07-31 DIAGNOSIS — Z7689 Persons encountering health services in other specified circumstances: Secondary | ICD-10-CM

## 2023-08-01 ENCOUNTER — Other Ambulatory Visit: Payer: Self-pay

## 2023-08-01 ENCOUNTER — Telehealth (HOSPITAL_BASED_OUTPATIENT_CLINIC_OR_DEPARTMENT_OTHER): Admitting: Internal Medicine

## 2023-08-01 ENCOUNTER — Other Ambulatory Visit (HOSPITAL_COMMUNITY): Payer: Self-pay

## 2023-08-01 ENCOUNTER — Encounter: Payer: Self-pay | Admitting: Internal Medicine

## 2023-08-01 DIAGNOSIS — N3000 Acute cystitis without hematuria: Secondary | ICD-10-CM | POA: Diagnosis not present

## 2023-08-01 DIAGNOSIS — K529 Noninfective gastroenteritis and colitis, unspecified: Secondary | ICD-10-CM | POA: Diagnosis not present

## 2023-08-01 MED ORDER — WEGOVY 0.5 MG/0.5ML ~~LOC~~ SOAJ
0.5000 mg | SUBCUTANEOUS | 0 refills | Status: DC
Start: 2023-08-01 — End: 2023-08-31
  Filled 2023-08-01: qty 2, 28d supply, fill #0

## 2023-08-01 MED ORDER — CIPROFLOXACIN HCL 500 MG PO TABS
500.0000 mg | ORAL_TABLET | Freq: Two times a day (BID) | ORAL | 0 refills | Status: AC
Start: 2023-08-01 — End: 2023-08-06
  Filled 2023-08-01 (×3): qty 10, 5d supply, fill #0

## 2023-08-01 MED ORDER — ONDANSETRON HCL 4 MG PO TABS
4.0000 mg | ORAL_TABLET | Freq: Two times a day (BID) | ORAL | 0 refills | Status: DC | PRN
  Filled 2023-08-01 (×3): qty 10, 5d supply, fill #0

## 2023-08-01 NOTE — Progress Notes (Signed)
 Patient ID: Monique Hunt, female   DOB: 09/18/1960, 63 y.o.   MRN: [id number] Virtual Visit via Video Note  I connected with Monique Hunt on 08/01/2023 at 1:24 PM by a video enabled telemedicine application and verified that I am speaking with the correct person using two identifiers.  Location: Patient: home Provider: Office   I discussed the limitations of evaluation and management by telemedicine and the availability of in person appointments. The patient expressed understanding and agreed to proceed.  History of Present Illness: Patient with history of HTN, CAD one stent to LAD 11/2017, depression, HL, LT thyroid nodule (no further f/u needed per endocrinology Dr. Lonzo Cloud), RT lung nodule (stable in size on CT 07/2020)   Discussed the use of AI scribe software for clinical note transcription with the patient, who gave verbal consent to proceed.  History of Present Illness   The patient presents with a one-week history of urinary symptoms suggestive of a UTI, including dysuria, urgency and urinary frequency.  Denies any flank or lower back pain.  No fever.  Concurrently, the patient has been experiencing gastrointestinal symptoms since early this morning, including vomiting and diarrhea. She denies fever but reports chills.  Endorses some stomach cramps when she vomits.  The patient has not recently been on any antibiotics. She suspects the gastrointestinal symptoms may be due to food consumed the previous night which was chicken, as she has been unable to keep down even water, though she has tolerated a small amount of ginger ale today.  Her significant other ate the same thing as she did and is not experiencing any vomiting or diarrhea. Outpatient Encounter Medications as of 08/01/2023  Medication Sig   amLODipine (NORVASC) 5 MG tablet Take 1 tablet (5 mg total) by mouth daily.   carvedilol (COREG) 25 MG tablet Take 1 tablet (25 mg total) by mouth 2 (two) times daily with a  meal.   esomeprazole (NEXIUM) 20 MG capsule Take 20 mg by mouth daily at 12 noon.   Evolocumab (REPATHA SURECLICK) 140 MG/ML SOAJ Inject 1 pen into the skin every 14 days.   isosorbide mononitrate (IMDUR) 30 MG 24 hr tablet Take 0.5 tablets (15 mg total) by mouth daily.   losartan (COZAAR) 100 MG tablet Take 1 tablet (100 mg total) by mouth daily.  Please call to schedule an appt before anymore refills. [phone].   nitroGLYCERIN (NITROSTAT) 0.4 MG SL tablet Place 1 tablet (0.4 mg total) under the tongue every 5 (five) minutes x 3 doses as needed for chest pain.   rosuvastatin (CRESTOR) 20 MG tablet Take 1 tablet by mouth daily.   Semaglutide-Weight Management (WEGOVY) 0.5 MG/0.5ML SOAJ Inject 0.5 mg into the skin once a week.   topiramate (TOPAMAX) 50 MG tablet Take 0.5 tablets (25 mg total) by mouth at bedtime.   venlafaxine XR (EFFEXOR-XR) 75 MG 24 hr capsule Take 1 capsule (75 mg total) by mouth daily with breakfast.   No facility-administered encounter medications on file as of 08/01/2023.      Observations/Objective: Middle-age Caucasian female in NAD.  Assessment and Plan: 1. Acute cystitis without hematuria (Primary) Sounds as though she has an acute urinary tract infection.  Will put her on ciprofloxacin twice a day for 5 days. - ciprofloxacin (CIPRO) 500 MG tablet; Take 1 tablet (500 mg total) by mouth 2 (two) times daily for 5 days.  Dispense: 10 tablet; Refill: 0  2. Acute gastroenteritis Advised to push fluids.  Purchase Imodium over-the-counter and take  as needed for the diarrhea.  Will give some Zofran to use as needed for the vomiting. - ondansetron (ZOFRAN) 4 MG tablet; Take 1 tablet (4 mg total) by mouth 2 (two) times daily as needed for nausea or vomiting.  Dispense: 10 tablet; Refill: 0   Follow Up Instructions: Follow-up if no improvement with the medication or any worsening of symptoms.   I discussed the assessment and treatment plan with the patient. The patient  was provided an opportunity to ask questions and all were answered. The patient agreed with the plan and demonstrated an understanding of the instructions.   The patient was advised to call back or seek an in-person evaluation if the symptoms worsen or if the condition fails to improve as anticipated.  I spent 7 minutes dedicated to the care of this patient on the date of this encounter to include previsit review of of chart and MyChart message, face-to-face time with patient discussing diagnosis and management and post visit entering of orders.  This note has been created with Education officer, environmental. Any transcriptional errors are unintentional.  Jonah Blue, MD

## 2023-08-02 ENCOUNTER — Other Ambulatory Visit: Payer: Self-pay

## 2023-08-10 ENCOUNTER — Other Ambulatory Visit (HOSPITAL_COMMUNITY): Payer: Self-pay

## 2023-08-18 ENCOUNTER — Other Ambulatory Visit: Payer: Self-pay | Admitting: Internal Medicine

## 2023-08-18 ENCOUNTER — Telehealth: Payer: Self-pay | Admitting: Family Medicine

## 2023-08-18 DIAGNOSIS — Z1231 Encounter for screening mammogram for malignant neoplasm of breast: Secondary | ICD-10-CM

## 2023-08-18 NOTE — Telephone Encounter (Signed)
 Called pt top confirm appt. Pt did not answer and VM was left

## 2023-08-19 ENCOUNTER — Other Ambulatory Visit: Payer: Self-pay | Admitting: Internal Medicine

## 2023-08-19 ENCOUNTER — Other Ambulatory Visit: Payer: Self-pay

## 2023-08-19 DIAGNOSIS — I10 Essential (primary) hypertension: Secondary | ICD-10-CM

## 2023-08-19 MED ORDER — CARVEDILOL 25 MG PO TABS
25.0000 mg | ORAL_TABLET | Freq: Two times a day (BID) | ORAL | 0 refills | Status: DC
Start: 2023-08-19 — End: 2023-09-29
  Filled 2023-08-19: qty 60, 30d supply, fill #0

## 2023-08-23 ENCOUNTER — Ambulatory Visit: Payer: Medicaid Other | Admitting: Internal Medicine

## 2023-08-29 ENCOUNTER — Other Ambulatory Visit (HOSPITAL_COMMUNITY): Payer: Self-pay

## 2023-08-29 NOTE — Progress Notes (Deleted)
 Cardiology Office Note:  .   Date:  08/29/2023  ID:  Monique Hunt, DOB February 27, 1961, MRN [id number] PCP: Marcine Matar, MD  [facility] Providers Cardiologist:  Dietrich Pates, MD Cardiology APP:  Beatrice Lecher, PA-C    Patient Profile: .      PMH Coronary artery disease S/p NSTEMI 11/2017>>PCI: DES to pLAD Stress echo 07/2018: no ischemia Cath 11/2019: oLAD 40, mLAD stent patent Rx with isosorbide Hypertension Hyperlipidemia Aortic atherosclerosis  She established with cardiology in 2019. History of coronary artery disease as outlined above. She has maintained consistent follow-up.  Last cardiology clinic visit was 08/30/2022 with Dr. Tenny Craw. She was having chronic diarrhea  4-8 stools per day. She was feeling fatigued and giving out of breath with simple tasks such as preparing meals, showering and walking. Amlodipine was discontinued 2/2 soft BP. CBC and TSH obtained that day were unremarkable. She denied s/s of angina.   She contacted our office 04/06/23 to report chest pain, SOB and nausea. Her SL NTG rx had expired. She was scheduled for an office visit but cancelled it.   Seen in clinic by me on 07/19/23 for follow-up of CAD. Reported recent episodes of chest pain and fatigue. Chest pain felt similar to her previous heart attack, originating in the middle of her back. Pain was relieved with nitroglycerin, which she has needed to use about four times in the last three months. Episodes of chest pain typically occur during light activity, such as household chores. She also reports feeling exhausted, with even a short walk to her mailbox causing her to breathe more heavily and noted by her husband. Also reports feeling her heart beating fast at times and occasional episodes of dizziness upon standing. No low BP readings at home < 100 mmHg systolic. No concerns for bleeding, orthopnea, PND, presyncope or syncope. Cholesterol has been well controlled with Repatha with last LDL 38.  She is tolerating Repatha without any concerning side effects.  We discussed previous anti-anginal therapy including isosorbide. She is unsure why she stopped it but does not recall a particular intolerance.  EKG revealed possible anterior infarct.  This was a new finding.       History of Present Illness: .   Monique Hunt is a very pleasant 63 y.o. female who is    Discussed the use of AI scribe software for clinical note transcription with the patient, who gave verbal consent to proceed.   ROS: See HPI       Studies Reviewed: .        Risk Assessment/Calculations:     No BP recorded.  {Refresh Note OR Click here to enter BP  :1}***       Physical Exam:   VS:  LMP 02/28/2011    Wt Readings from Last 3 Encounters:  07/19/23 134 lb 6.4 oz (61 kg)  12/10/22 139 lb 9.6 oz (63.3 kg)  10/13/22 157 lb 9.6 oz (71.5 kg)    GEN: Well nourished, well developed in no acute distress NECK: No JVD; No carotid bruits CARDIAC: RRR, no murmurs, rubs, gallops RESPIRATORY:  Clear to auscultation without rales, wheezing or rhonchi  ABDOMEN: Soft, non-tender, non-distended EXTREMITIES:  No edema; No deformity     ASSESSMENT AND PLAN: .    CAD with unstable angina: She is having chest discomfort, DOE, and fatigue which has increased over the previous 3 months, relieved with SL NTG.  She was previously on isosorbide but is unsure as to  why this was discontinued, possibly due to soft BP. EKG reveals normal sinus rhythm at 80 bpm, possible prior anterior infarct. This is a new finding since previous EKG 08/2022.  We will reinitiate isosorbide at 15 mg daily.  I have advised her to monitor BP and to be cautious of sudden movement as she may develop orthostatic hypotension. Encouraged good hydration. Let us know if you do not tolerate.  We will get cardiac PET/CT to evaluate for worsening ischemia.  Continue SL NTG as needed.  ER precautions advised. No bleeding concerns.  Continue Repatha,  amlodipine, carvedilol, rosuvastatin, and losartan. Consider reducing losartan dose if she becomes orthostatic.  I will see her back for soon follow-up.  Hyperlipidemia LDL goal < 55: Lipid panel from 08/19/22 with LDL-C of 8, triglycerides 409. We will get updated lipid panel when she can return fasting.  No concerning side effects on Repatha and rosuvastatin.  Continue these medications.  Hypertension: BP is well controlled, somewhat soft. Occasional dizziness when going from sitting to standing. We are adding Imdur 15 mg due to worsening angina. Encouraged good hydration and caution with sudden movement.  Consider reducing losartan dose if she develops orthostatic hypotension.       Disposition: ***  Signed, Eligha Bridegroom, NP-C

## 2023-08-31 ENCOUNTER — Other Ambulatory Visit: Payer: Self-pay | Admitting: Internal Medicine

## 2023-08-31 DIAGNOSIS — Z7689 Persons encountering health services in other specified circumstances: Secondary | ICD-10-CM

## 2023-09-01 ENCOUNTER — Other Ambulatory Visit: Payer: Self-pay

## 2023-09-01 ENCOUNTER — Ambulatory Visit: Payer: Medicaid Other | Admitting: Nurse Practitioner

## 2023-09-01 ENCOUNTER — Other Ambulatory Visit (HOSPITAL_COMMUNITY): Payer: Self-pay

## 2023-09-01 MED ORDER — WEGOVY 0.5 MG/0.5ML ~~LOC~~ SOAJ
0.5000 mg | SUBCUTANEOUS | 0 refills | Status: DC
Start: 2023-09-01 — End: 2023-09-29
  Filled 2023-09-01: qty 2, 28d supply, fill #0

## 2023-09-05 ENCOUNTER — Ambulatory Visit
Admission: RE | Admit: 2023-09-05 | Discharge: 2023-09-05 | Disposition: A | Discharge status: Home / Self Care | Source: Ambulatory Visit | Attending: Internal Medicine | Admitting: Internal Medicine

## 2023-09-05 DIAGNOSIS — Z1231 Encounter for screening mammogram for malignant neoplasm of breast: Secondary | ICD-10-CM

## 2023-09-07 ENCOUNTER — Encounter: Payer: Self-pay | Admitting: Internal Medicine

## 2023-09-14 ENCOUNTER — Other Ambulatory Visit (HOSPITAL_COMMUNITY): Payer: Self-pay

## 2023-09-14 ENCOUNTER — Other Ambulatory Visit: Payer: Self-pay | Admitting: Internal Medicine

## 2023-09-14 DIAGNOSIS — I209 Angina pectoris, unspecified: Secondary | ICD-10-CM

## 2023-09-14 DIAGNOSIS — E785 Hyperlipidemia, unspecified: Secondary | ICD-10-CM

## 2023-09-14 DIAGNOSIS — I25119 Atherosclerotic heart disease of native coronary artery with unspecified angina pectoris: Secondary | ICD-10-CM

## 2023-09-14 MED ORDER — REPATHA SURECLICK 140 MG/ML ~~LOC~~ SOAJ
SUBCUTANEOUS | 11 refills | Status: AC
  Filled 2023-09-14: qty 2, 28d supply, fill #0
  Filled 2023-09-29 – 2023-10-05 (×2): qty 2, 28d supply, fill #1
  Filled 2023-11-05: qty 2, 28d supply, fill #2
  Filled 2023-12-05: qty 2, 28d supply, fill #3
  Filled 2023-12-29: qty 2, 28d supply, fill #4
  Filled 2024-01-31: qty 2, 28d supply, fill #5
  Filled 2024-02-26: qty 2, 28d supply, fill #6
  Filled 2024-03-23: qty 2, 28d supply, fill #7
  Filled 2024-04-20: qty 2, 28d supply, fill #8
  Filled 2024-05-27: qty 2, 28d supply, fill #9

## 2023-09-15 ENCOUNTER — Other Ambulatory Visit (HOSPITAL_COMMUNITY): Payer: Self-pay

## 2023-09-29 ENCOUNTER — Other Ambulatory Visit: Payer: Self-pay | Admitting: Internal Medicine

## 2023-09-29 DIAGNOSIS — F3342 Major depressive disorder, recurrent, in full remission: Secondary | ICD-10-CM

## 2023-09-29 DIAGNOSIS — I10 Essential (primary) hypertension: Secondary | ICD-10-CM

## 2023-09-29 DIAGNOSIS — Z7689 Persons encountering health services in other specified circumstances: Secondary | ICD-10-CM

## 2023-09-30 ENCOUNTER — Other Ambulatory Visit (HOSPITAL_COMMUNITY): Payer: Self-pay

## 2023-09-30 MED ORDER — AMLODIPINE BESYLATE 5 MG PO TABS
5.0000 mg | ORAL_TABLET | Freq: Every day | ORAL | 0 refills | Status: DC
  Filled 2023-09-30: qty 30, 30d supply, fill #0

## 2023-09-30 MED ORDER — CARVEDILOL 25 MG PO TABS
25.0000 mg | ORAL_TABLET | Freq: Two times a day (BID) | ORAL | 0 refills | Status: DC
  Filled 2023-09-30: qty 60, 30d supply, fill #0

## 2023-09-30 MED ORDER — VENLAFAXINE HCL ER 75 MG PO CP24
75.0000 mg | ORAL_CAPSULE | Freq: Every day | ORAL | 0 refills | Status: DC
Start: 2023-09-30 — End: 2023-11-05
  Filled 2023-09-30: qty 30, 30d supply, fill #0

## 2023-09-30 MED ORDER — WEGOVY 0.5 MG/0.5ML ~~LOC~~ SOAJ
0.5000 mg | SUBCUTANEOUS | 0 refills | Status: DC
  Filled 2023-09-30: qty 2, 28d supply, fill #0

## 2023-10-01 ENCOUNTER — Other Ambulatory Visit: Payer: Self-pay | Admitting: Physician Assistant

## 2023-10-01 ENCOUNTER — Other Ambulatory Visit (HOSPITAL_COMMUNITY): Payer: Self-pay

## 2023-10-01 DIAGNOSIS — I2510 Atherosclerotic heart disease of native coronary artery without angina pectoris: Secondary | ICD-10-CM

## 2023-10-03 ENCOUNTER — Other Ambulatory Visit: Payer: Self-pay | Admitting: Physician Assistant

## 2023-10-03 ENCOUNTER — Other Ambulatory Visit: Payer: Self-pay

## 2023-10-03 ENCOUNTER — Telehealth (HOSPITAL_COMMUNITY): Payer: Self-pay | Admitting: *Deleted

## 2023-10-03 ENCOUNTER — Other Ambulatory Visit (HOSPITAL_COMMUNITY): Payer: Self-pay

## 2023-10-03 DIAGNOSIS — I2510 Atherosclerotic heart disease of native coronary artery without angina pectoris: Secondary | ICD-10-CM

## 2023-10-03 MED ORDER — ROSUVASTATIN CALCIUM 20 MG PO TABS
20.0000 mg | ORAL_TABLET | Freq: Every day | ORAL | 1 refills | Status: DC
Start: 2023-10-03 — End: 2024-04-16
  Filled 2023-10-03: qty 90, 90d supply, fill #0
  Filled 2024-01-03: qty 90, 90d supply, fill #1

## 2023-10-03 NOTE — Telephone Encounter (Signed)
 Reaching out to patient to offer assistance regarding upcoming cardiac imaging study; pt verbalizes understanding of appt date/time, parking situation and where to check in, pre-test NPO status and verified current allergies; name and call back number provided for further questions should they arise  Monique Brick RN Navigator Cardiac Imaging [facility] [phone] office [phone] cell  Patient aware to avoid caffeine 12 hours prior to her cardiac PET scan.

## 2023-10-04 ENCOUNTER — Ambulatory Visit (HOSPITAL_COMMUNITY)
Admission: RE | Admit: 2023-10-04 | Discharge: 2023-10-04 | Disposition: A | Discharge status: Home / Self Care | Payer: Medicaid Other | Source: Ambulatory Visit | Attending: Nurse Practitioner | Admitting: Nurse Practitioner

## 2023-10-04 DIAGNOSIS — I10 Essential (primary) hypertension: Secondary | ICD-10-CM | POA: Diagnosis not present

## 2023-10-04 DIAGNOSIS — E785 Hyperlipidemia, unspecified: Secondary | ICD-10-CM | POA: Insufficient documentation

## 2023-10-04 DIAGNOSIS — I25110 Atherosclerotic heart disease of native coronary artery with unstable angina pectoris: Secondary | ICD-10-CM | POA: Insufficient documentation

## 2023-10-04 LAB — NM PET CT CARDIAC PERFUSION MULTI W/ABSOLUTE BLOODFLOW
LV dias vol: 61 mL (ref 46–106)
LV sys vol: 17 mL
MBFR: 3.2
Nuc Rest EF: 72 %
Nuc Stress EF: 80 %
Peak HR: 104 {beats}/min
Rest HR: 72 {beats}/min
Rest MBF: 1.07 ml/g/min
Rest Nuclear Isotope Dose: 15.9 mCi
ST Depression (mm): 0 mm
Stress MBF: 3.42 ml/g/min
Stress Nuclear Isotope Dose: 15.8 mCi

## 2023-10-04 MED ORDER — RUBIDIUM RB82 GENERATOR (RUBYFILL)
15.8800 | PACK | Freq: Once | INTRAVENOUS | Status: AC
Start: 2023-10-04 — End: 2023-10-04
  Administered 2023-10-04: 15.88 via INTRAVENOUS

## 2023-10-04 MED ORDER — RUBIDIUM RB82 GENERATOR (RUBYFILL)
15.8400 | PACK | Freq: Once | INTRAVENOUS | Status: AC
  Administered 2023-10-04: 15.84 via INTRAVENOUS

## 2023-10-04 MED ORDER — REGADENOSON 0.4 MG/5ML IV SOLN
0.4000 mg | Freq: Once | INTRAVENOUS | Status: AC
  Administered 2023-10-04: 0.4 mg via INTRAVENOUS

## 2023-10-04 MED ORDER — REGADENOSON 0.4 MG/5ML IV SOLN
INTRAVENOUS | Status: AC
  Filled 2023-10-04: qty 5

## 2023-10-05 ENCOUNTER — Encounter (HOSPITAL_BASED_OUTPATIENT_CLINIC_OR_DEPARTMENT_OTHER): Payer: Self-pay

## 2023-10-11 ENCOUNTER — Encounter: Payer: Self-pay | Admitting: Internal Medicine

## 2023-10-11 ENCOUNTER — Ambulatory Visit: Attending: Internal Medicine | Admitting: Internal Medicine

## 2023-10-11 VITALS — BP 124/84 | HR 84 | Temp 97.9°F | Resp 16 | Ht 65.0 in | Wt 130.0 lb

## 2023-10-11 DIAGNOSIS — F325 Major depressive disorder, single episode, in full remission: Secondary | ICD-10-CM

## 2023-10-11 DIAGNOSIS — I10 Essential (primary) hypertension: Secondary | ICD-10-CM

## 2023-10-11 DIAGNOSIS — R7303 Prediabetes: Secondary | ICD-10-CM | POA: Diagnosis not present

## 2023-10-11 DIAGNOSIS — F32A Depression, unspecified: Secondary | ICD-10-CM

## 2023-10-11 DIAGNOSIS — Z713 Dietary counseling and surveillance: Secondary | ICD-10-CM | POA: Diagnosis not present

## 2023-10-11 DIAGNOSIS — I2510 Atherosclerotic heart disease of native coronary artery without angina pectoris: Secondary | ICD-10-CM

## 2023-10-11 DIAGNOSIS — R739 Hyperglycemia, unspecified: Secondary | ICD-10-CM

## 2023-10-11 DIAGNOSIS — Z7689 Persons encountering health services in other specified circumstances: Secondary | ICD-10-CM

## 2023-10-11 NOTE — Progress Notes (Signed)
 Patient ID: Monique Hunt, female    DOB: 1960-10-09  MRN: [id number]  CC: Hypertension and Weight Check   Subjective: Monique Hunt is a 63 y.o. female who presents for chronic ds management. Her concerns today include:  Patient with history of HTN, CAD one stent to LAD 11/2017, depression, HL, LT thyroid  nodule (no further f/u needed per endocrinology Dr. Rosalea Collin), RT lung nodule (stable in size on CT 07/2020)   Discussed the use of AI scribe software for clinical note transcription with the patient, who gave verbal consent to proceed.  History of Present Illness Monique Hunt is a 63 year old female with obesity and hypertension who presents for follow-up on weight management and blood pressure control.  She has been on Wegovy  0.5 mg for weight management since last year and has maintained the same weight for about three months despite regular exercise and a sensible diet. She exercises three to four times a week, including 45 minutes on a stationary bike and using weight machines. She has lost 30 pounds over the past year, going from 160 pounds to 130 pounds, and has dropped from a size 14 to a size 8-10 in clothing. She notes increased hunger but continues to monitor her diet.  She inquires about increasing the dose of the Wegovy .  Would like to lose an additional 10 pounds before her son's wedding in September.  BMI is currently at 21.  HTN/CAD: She is on carvedilol  25 mg twice daily, Cozaar  100 mg daily, isosorbide  30 mg half a tablet daily and amlodipine  5 mg daily for hypertension. She occasionally checks her blood pressure at home, with a reading of around 128 mmHg during an episode of back pain two months ago. She limits her salt intake. She continues to take rosuvastatin  and Repatha  for cholesterol management.  Denies any chest pains, shortness of breath or lower extremity edema.  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      Patient Active Problem List   Diagnosis Date Noted   Anxiety 01/18/2022   Migraine without aura and without status migrainosus, not intractable 04/06/2021   Prediabetes 11/05/2020   Coronary artery disease involving native coronary artery of native heart with angina pectoris (HCC) 08/06/2020   Low TSH level 08/04/2018   Left thyroid  nodule 04/07/2018   Lung nodule, solitary 04/07/2018   Hyperlipidemia LDL goal <70    Old MI (myocardial infarction) 12/15/2017   Essential hypertension 12/01/2017   Depression 06/04/2012   Nephrolithiasis 09/27/2011     Current Outpatient Medications on File Prior to Visit  Medication Sig Dispense Refill   amLODipine  (NORVASC ) 5 MG tablet Take 1 tablet (5 mg total) by mouth daily. 30 tablet 0   carvedilol  (COREG ) 25 MG tablet Take 1 tablet (25 mg total) by mouth 2 (two) times daily with a meal. 60 tablet 0   esomeprazole (NEXIUM) 20 MG capsule Take 20 mg by mouth daily at 12 noon.     Evolocumab  (REPATHA  SURECLICK) 140 MG/ML SOAJ Inject 1 pen into the skin every 14 days. 2 mL 11   losartan  (COZAAR ) 100 MG tablet Take 1 tablet (100 mg total) by mouth daily.  Please call to schedule an appt before anymore refills. [phone]. 90 tablet 3   nitroGLYCERIN  (NITROSTAT ) 0.4 MG SL tablet Place 1 tablet (0.4 mg total) under the tongue every 5 (five) minutes x 3 doses as needed for chest pain. 25 tablet 3   rosuvastatin  (  CRESTOR ) 20 MG tablet Take 1 tablet by mouth daily. 90 tablet 1   Semaglutide -Weight Management (WEGOVY ) 0.5 MG/0.5ML SOAJ Inject 0.5 mg into the skin once a week. 2 mL 0   topiramate  (TOPAMAX ) 50 MG tablet Take 0.5 tablets (25 mg total) by mouth at bedtime. 30 tablet 2   venlafaxine  XR (EFFEXOR -XR) 75 MG 24 hr capsule Take 1 capsule (75 mg total) by mouth daily with breakfast. 30 capsule 0   isosorbide  mononitrate (IMDUR ) 30 MG 24 hr tablet Take 0.5 tablets (15 mg total) by mouth daily. (Patient not taking: Reported on 10/11/2023) 45 tablet 3    No current facility-administered medications on file prior to visit.    Not on File  Social History   Socioeconomic History   Marital status: Divorced    Spouse name: Not on file   Number of children: 2   Years of education: Not on file   Highest education level: Associate degree: occupational, Scientist, product/process development, or vocational program  Occupational History   Not on file  Tobacco Use   Smoking status: Never   Smokeless tobacco: Never  Vaping Use   Vaping status: Never Used  Substance and Sexual Activity   Alcohol use: Yes    Comment: 3 TIMES A WEEK   Drug use: Never   Sexual activity: Yes    Comment: menopausal  Other Topics Concern   Not on file  Social History Narrative   Not on file   Social Drivers of Health   Financial Resource Strain: Low Risk  (10/10/2023)   Overall Financial Resource Strain (CARDIA)    Difficulty of Paying Living Expenses: Not hard at all  Food Insecurity: No Food Insecurity (10/10/2023)   Hunger Vital Sign    Worried About Running Out of Food in the Last Year: Never true    Ran Out of Food in the Last Year: Never true  Transportation Needs: No Transportation Needs (10/10/2023)   PRAPARE - Administrator, Civil Service (Medical): No    Lack of Transportation (Non-Medical): No  Physical Activity: Insufficiently Active (10/10/2023)   Exercise Vital Sign    Days of Exercise per Week: 3 days    Minutes of Exercise per Session: 40 min  Stress: No Stress Concern Present (10/10/2023)   [employer] of Occupational Health - Occupational Stress Questionnaire    Feeling of Stress : Not at all  Social Connections: Socially Isolated (10/10/2023)   Social Connection and Isolation Panel [NHANES]    Frequency of Communication with Friends and Family: More than three times a week    Frequency of Social Gatherings with Friends and Family: Three times a week    Attends Religious Services: Never    Active Member of Clubs or Organizations: No     Attends Engineer, structural: Not on file    Marital Status: Divorced  Catering manager Violence: Not on file    Family History  Problem Relation Age of Onset   CAD Father    Heart disease Father    Breast cancer Mother    Hyperlipidemia Sister    Hyperlipidemia Brother    Colon cancer Neg Hx    Esophageal cancer Neg Hx    Rectal cancer Neg Hx    Stomach cancer Neg Hx     Past Surgical History:  Procedure Laterality Date   BREAST BIOPSY Right 09/27/2022   US  RT BREAST BX W LOC DEV 1ST LESION IMG BX SPEC US  GUIDE 09/27/2022 [facility] MAMMOGRAPHY  CORONARY STENT INTERVENTION N/A 12/19/2017   Procedure: CORONARY STENT INTERVENTION;  Surgeon: Arty Binning, MD;  Location: [facility] INVASIVE CV LAB;  Service: Cardiovascular;  Laterality: N/A;   CYSTOSCOPY W/ STONE MANIPULATION     FACIAL FRACTURE SURGERY  1979   LEFT HEART CATH AND CORONARY ANGIOGRAPHY N/A 12/16/2017   Procedure: LEFT HEART CATH AND CORONARY ANGIOGRAPHY;  Surgeon: Arty Binning, MD;  Location: [facility] INVASIVE CV LAB;  Service: Cardiovascular;  Laterality: N/A;   LEFT HEART CATH AND CORONARY ANGIOGRAPHY N/A 12/19/2019   Procedure: LEFT HEART CATH AND CORONARY ANGIOGRAPHY;  Surgeon: Avanell Leigh, MD;  Location: [facility] INVASIVE CV LAB;  Service: Cardiovascular;  Laterality: N/A;   LITHOTRIPSY     SKIN SURGERY Right    "birthmark removed under my arm when I was little"    ROS: Review of Systems Negative except as stated above  PHYSICAL EXAM: BP 124/84   Pulse 84   Temp 97.9 F (36.6 C) (Oral)   Resp 16   Ht 5\' 5"  (1.651 m)   Wt 130 lb (59 kg)   LMP 02/28/2011   SpO2 99%   BMI 21.63 kg/m   Wt Readings from Last 3 Encounters:  10/11/23 130 lb (59 kg)  07/19/23 134 lb 6.4 oz (61 kg)  12/10/22 139 lb 9.6 oz (63.3 kg)    Physical Exam  General appearance - alert, well appearing, and in no distress Mental status - normal mood, behavior, speech, dress, motor activity, and thought processes Neck - supple, no  significant adenopathy Chest - clear to auscultation, no wheezes, rales or rhonchi, symmetric air entry Heart - normal rate, regular rhythm, normal S1, S2, no murmurs, rubs, clicks or gallops Extremities - peripheral pulses normal, no pedal edema, no clubbing or cyanosis      Latest Ref Rng & Units 08/19/2022    8:50 AM 08/25/2021    2:43 PM 11/04/2020    4:43 PM  CMP  Glucose 70 - 99 mg/dL 161  096  92   BUN 8 - 27 mg/dL 27  17  25    Creatinine 0.57 - 1.00 mg/dL 0.45  4.09  8.11   Sodium 134 - 144 mmol/L 143  137  138   Potassium 3.5 - 5.2 mmol/L 4.7  5.1  4.3   Chloride 96 - 106 mmol/L 104  103  101   CO2 20 - 29 mmol/L 23  22  22    Calcium  8.7 - 10.3 mg/dL 91.4  78.2  95.6   Total Protein 6.0 - 8.5 g/dL 7.4  7.9    Total Bilirubin 0.0 - 1.2 mg/dL 0.2  0.4    Alkaline Phos 44 - 121 IU/L 90  117    AST 0 - 40 IU/L 28  24    ALT 0 - 32 IU/L 41  29     Lipid Panel     Component Value Date/Time   CHOL 115 07/21/2023 1314   TRIG 166 (H) 07/21/2023 1314   HDL 75 07/21/2023 1314   CHOLHDL 1.5 07/21/2023 1314   CHOLHDL 7.2 12/16/2017 0714   VLDL 79 (H) 12/16/2017 0714   LDLCALC 14 07/21/2023 1314    CBC    Component Value Date/Time   WBC 5.4 08/30/2022 1042   WBC 7.7 08/18/2020 1703   RBC 4.06 08/30/2022 1042   RBC 4.85 08/18/2020 1703   HGB 13.1 08/30/2022 1042   HCT 39.0 08/30/2022 1042   PLT 296 08/30/2022 1042   MCV  96 08/30/2022 1042   MCH 32.3 08/30/2022 1042   MCH 30.3 08/18/2020 1703   MCHC 33.6 08/30/2022 1042   MCHC 33.0 08/18/2020 1703   RDW 13.7 08/30/2022 1042   LYMPHSABS 1.0 08/30/2022 1042   MONOABS 0.6 02/10/2018 1741   EOSABS 0.2 08/30/2022 1042   BASOSABS 0.1 08/30/2022 1042    ASSESSMENT AND PLAN: 1. Essential hypertension (Primary) Not at goal but repeat blood pressure closer to goal.  Encouraged her to check blood pressure at least once a week with goal being 130/80 or lower.  She can send me her readings in a few weeks via MyChart.  Continue  Cozaar  100 mg daily, amlodipine  5 mg daily, isosorbide  30 mg half a tablet daily and carvedilol  25 mg twice a day - CBC - Comprehensive metabolic panel with GFR  2. Coronary artery disease involving native coronary artery of native heart without angina pectoris Stable.  Continue isosorbide , carvedilol , Repatha  and Crestor .  She has not been taking aspirin .  Advised her to purchase baby aspirin  over-the-counter and take 1 daily. - Lipid panel  3. Encounter for weight management She has lost 30 pounds over the past year on Wegovy .  BMI is now within normal range.  I recommend that we continue the current dose of the Wegovy  at the 0.5 mg once a week rather than increase the dose because I do not want her to become underweight. Continue healthy eating habits and regular exercise.  4. Depressive disorder in remission Stable on Effexor .   Addendum 10/12/2023: Patient with mild elevation in blood sugar level.  Prior history of prediabetes.  Will add A1c with diagnosis of hyperglycemia/prediabetes.   Patient was given the opportunity to ask questions.  Patient verbalized understanding of the plan and was able to repeat key elements of the plan.   This documentation was completed using Paediatric nurse.  Any transcriptional errors are unintentional.  Orders Placed This Encounter  Procedures   CBC   Comprehensive metabolic panel with GFR   Lipid panel     Requested Prescriptions    No prescriptions requested or ordered in this encounter    Return in about 4 months (around 02/11/2024).  Concetta Dee, MD, FACP

## 2023-10-11 NOTE — Patient Instructions (Signed)
 VISIT SUMMARY:  Today, we reviewed your progress with weight management and blood pressure control. You have maintained your weight and are continuing with your exercise routine and diet. We also discussed your hypertension, coronary artery disease, and general health maintenance.  YOUR PLAN:  -HYPERTENSION: Hypertension means high blood pressure. Your blood pressure was slightly elevated today. Please use your home blood pressure device once or twice a week and send your readings via MyChart after two weeks.  -CORONARY ARTERY DISEASE: Coronary artery disease is a condition where the blood vessels supplying the heart are narrowed or blocked. You have a history of this condition but no recent chest pain or shortness of breath. Please resume taking baby aspirin  as advised.  -OBESITY: Obesity means having excess body fat. You have successfully lost 30 pounds over the past year and your weight has plateaued. Continue with your current exercise and diet routine. We will not increase your Wegovy  dose to avoid becoming underweight.   -MAJOR DEPRESSIVE DISORDER, IN FULL REMISSION: Major depressive disorder is a mental health condition characterized by persistent feelings of sadness and loss of interest. Your condition is in full remission with Effexor , and you have reported no symptoms.  -GENERAL HEALTH MAINTENANCE: Your mammogram and colon cancer screening are up to date. You are due for your annual blood tests. Please complete these tests at the lab today.  INSTRUCTIONS:  Please use your home blood pressure device once or twice a week and send your readings via MyChart after two weeks. Also, complete your annual blood tests at the lab today.

## 2023-10-12 ENCOUNTER — Ambulatory Visit: Payer: Self-pay | Admitting: Internal Medicine

## 2023-10-12 LAB — COMPREHENSIVE METABOLIC PANEL WITH GFR
ALT: 25 IU/L (ref 0–32)
AST: 24 IU/L (ref 0–40)
Albumin: 4.7 g/dL (ref 3.9–4.9)
Alkaline Phosphatase: 81 IU/L (ref 44–121)
BUN/Creatinine Ratio: 14 (ref 12–28)
BUN: 10 mg/dL (ref 8–27)
Bilirubin Total: 0.5 mg/dL (ref 0.0–1.2)
CO2: 16 mmol/L — ABNORMAL LOW (ref 20–29)
Calcium: 9.9 mg/dL (ref 8.7–10.3)
Chloride: 103 mmol/L (ref 96–106)
Creatinine, Ser: 0.72 mg/dL (ref 0.57–1.00)
Globulin, Total: 2.4 g/dL (ref 1.5–4.5)
Glucose: 139 mg/dL — ABNORMAL HIGH (ref 70–99)
Potassium: 4.4 mmol/L (ref 3.5–5.2)
Sodium: 140 mmol/L (ref 134–144)
Total Protein: 7.1 g/dL (ref 6.0–8.5)
eGFR: 94 mL/min/{1.73_m2} (ref >59)

## 2023-10-12 LAB — CBC
Hematocrit: 44.1 % (ref 34.0–46.6)
Hemoglobin: 14 g/dL (ref 11.1–15.9)
MCH: 31.3 pg (ref 26.6–33.0)
MCHC: 31.7 g/dL (ref 31.5–35.7)
MCV: 98 fL — ABNORMAL HIGH (ref 79–97)
Platelets: 361 10*3/uL (ref 150–450)
RBC: 4.48 x10E6/uL (ref 3.77–5.28)
RDW: 12.8 % (ref 11.7–15.4)
WBC: 4.6 10*3/uL (ref 3.4–10.8)

## 2023-10-12 LAB — LIPID PANEL
Chol/HDL Ratio: 2.2 ratio (ref 0.0–4.4)
Cholesterol, Total: 161 mg/dL (ref 100–199)
HDL: 74 mg/dL (ref >39)
LDL Chol Calc (NIH): 53 mg/dL (ref 0–99)
Triglycerides: 216 mg/dL — ABNORMAL HIGH (ref 0–149)
VLDL Cholesterol Cal: 34 mg/dL (ref 5–40)

## 2023-10-12 NOTE — Addendum Note (Signed)
 Addended by: Concetta Dee B on: 10/12/2023 10:21 AM   Modules accepted: Orders

## 2023-10-13 ENCOUNTER — Ambulatory Visit (HOSPITAL_BASED_OUTPATIENT_CLINIC_OR_DEPARTMENT_OTHER): Admitting: Nurse Practitioner

## 2023-10-13 LAB — SPECIMEN STATUS REPORT

## 2023-10-13 LAB — HEMOGLOBIN A1C
Est. average glucose Bld gHb Est-mCnc: 108 mg/dL
Hgb A1c MFr Bld: 5.4 % (ref 4.8–5.6)

## 2023-10-23 ENCOUNTER — Other Ambulatory Visit: Payer: Self-pay | Admitting: Internal Medicine

## 2023-10-23 DIAGNOSIS — Z7689 Persons encountering health services in other specified circumstances: Secondary | ICD-10-CM

## 2023-10-25 MED ORDER — WEGOVY 0.5 MG/0.5ML ~~LOC~~ SOAJ
0.5000 mg | SUBCUTANEOUS | 0 refills | Status: DC
  Filled 2023-10-25: qty 2, 28d supply, fill #0

## 2023-10-26 ENCOUNTER — Other Ambulatory Visit: Payer: Self-pay

## 2023-10-26 ENCOUNTER — Other Ambulatory Visit (HOSPITAL_COMMUNITY): Payer: Self-pay

## 2023-11-05 ENCOUNTER — Other Ambulatory Visit: Payer: Self-pay | Admitting: Internal Medicine

## 2023-11-05 DIAGNOSIS — G43009 Migraine without aura, not intractable, without status migrainosus: Secondary | ICD-10-CM

## 2023-11-05 DIAGNOSIS — I10 Essential (primary) hypertension: Secondary | ICD-10-CM

## 2023-11-05 DIAGNOSIS — F3342 Major depressive disorder, recurrent, in full remission: Secondary | ICD-10-CM

## 2023-11-07 ENCOUNTER — Other Ambulatory Visit (HOSPITAL_COMMUNITY): Payer: Self-pay

## 2023-11-07 ENCOUNTER — Other Ambulatory Visit: Payer: Self-pay

## 2023-11-07 MED ORDER — TOPIRAMATE 50 MG PO TABS
25.0000 mg | ORAL_TABLET | Freq: Every day | ORAL | 2 refills | Status: AC
  Filled 2023-11-07: qty 30, 60d supply, fill #0
  Filled 2024-02-26: qty 30, 60d supply, fill #1

## 2023-11-07 MED ORDER — CARVEDILOL 25 MG PO TABS
25.0000 mg | ORAL_TABLET | Freq: Two times a day (BID) | ORAL | 0 refills | Status: DC
  Filled 2023-11-07: qty 60, 30d supply, fill #0

## 2023-11-07 MED ORDER — AMLODIPINE BESYLATE 5 MG PO TABS
5.0000 mg | ORAL_TABLET | Freq: Every day | ORAL | 0 refills | Status: DC
Start: 2023-11-07 — End: 2023-11-10
  Filled 2023-11-07: qty 30, 30d supply, fill #0

## 2023-11-07 MED ORDER — VENLAFAXINE HCL ER 75 MG PO CP24
75.0000 mg | ORAL_CAPSULE | Freq: Every day | ORAL | 0 refills | Status: DC
  Filled 2023-11-07: qty 30, 30d supply, fill #0

## 2023-11-09 ENCOUNTER — Encounter: Payer: Self-pay | Admitting: Internal Medicine

## 2023-11-10 ENCOUNTER — Other Ambulatory Visit: Payer: Self-pay | Admitting: Internal Medicine

## 2023-11-10 DIAGNOSIS — I10 Essential (primary) hypertension: Secondary | ICD-10-CM

## 2023-11-10 MED ORDER — AMLODIPINE BESYLATE 10 MG PO TABS
10.0000 mg | ORAL_TABLET | Freq: Every day | ORAL | 1 refills | Status: DC
  Filled 2023-11-10 – 2023-12-03 (×2): qty 90, 90d supply, fill #0
  Filled 2024-02-29: qty 90, 90d supply, fill #1

## 2023-11-11 ENCOUNTER — Other Ambulatory Visit (HOSPITAL_COMMUNITY): Payer: Self-pay

## 2023-11-26 ENCOUNTER — Other Ambulatory Visit: Payer: Self-pay | Admitting: Internal Medicine

## 2023-11-26 DIAGNOSIS — Z7689 Persons encountering health services in other specified circumstances: Secondary | ICD-10-CM

## 2023-11-26 DIAGNOSIS — F3342 Major depressive disorder, recurrent, in full remission: Secondary | ICD-10-CM

## 2023-11-28 MED ORDER — WEGOVY 0.5 MG/0.5ML ~~LOC~~ SOAJ
0.5000 mg | SUBCUTANEOUS | 2 refills | Status: DC
  Filled 2023-11-28: qty 2, 28d supply, fill #0
  Filled 2023-12-28 – 2023-12-29 (×2): qty 2, 28d supply, fill #1
  Filled 2024-01-25: qty 2, 28d supply, fill #2

## 2023-11-28 MED ORDER — VENLAFAXINE HCL ER 75 MG PO CP24
75.0000 mg | ORAL_CAPSULE | Freq: Every day | ORAL | 0 refills | Status: DC
  Filled 2023-11-28 – 2023-12-03 (×2): qty 90, 90d supply, fill #0

## 2023-11-29 ENCOUNTER — Other Ambulatory Visit (HOSPITAL_COMMUNITY): Payer: Self-pay

## 2023-11-29 ENCOUNTER — Other Ambulatory Visit: Payer: Self-pay

## 2023-12-03 ENCOUNTER — Other Ambulatory Visit: Payer: Self-pay | Admitting: Physician Assistant

## 2023-12-03 ENCOUNTER — Other Ambulatory Visit (HOSPITAL_COMMUNITY): Payer: Self-pay

## 2023-12-03 DIAGNOSIS — I10 Essential (primary) hypertension: Secondary | ICD-10-CM

## 2023-12-05 ENCOUNTER — Other Ambulatory Visit (HOSPITAL_COMMUNITY): Payer: Self-pay

## 2023-12-05 ENCOUNTER — Other Ambulatory Visit: Payer: Self-pay

## 2023-12-05 MED ORDER — LOSARTAN POTASSIUM 100 MG PO TABS
100.0000 mg | ORAL_TABLET | Freq: Every day | ORAL | 3 refills | Status: AC
  Filled 2023-12-05: qty 90, 90d supply, fill #0
  Filled 2024-02-29: qty 90, 90d supply, fill #1
  Filled 2024-05-27: qty 90, 90d supply, fill #2

## 2023-12-06 ENCOUNTER — Other Ambulatory Visit (HOSPITAL_COMMUNITY): Payer: Self-pay

## 2023-12-26 ENCOUNTER — Other Ambulatory Visit: Payer: Self-pay

## 2023-12-29 ENCOUNTER — Other Ambulatory Visit (HOSPITAL_COMMUNITY): Payer: Self-pay

## 2023-12-29 ENCOUNTER — Other Ambulatory Visit: Payer: Self-pay

## 2023-12-29 ENCOUNTER — Encounter: Payer: Self-pay | Admitting: Pharmacist

## 2023-12-29 ENCOUNTER — Other Ambulatory Visit: Payer: Self-pay | Admitting: Medical Genetics

## 2023-12-29 ENCOUNTER — Other Ambulatory Visit: Payer: Self-pay | Admitting: Internal Medicine

## 2023-12-29 ENCOUNTER — Telehealth: Payer: Self-pay | Admitting: Internal Medicine

## 2023-12-29 ENCOUNTER — Telehealth (HOSPITAL_COMMUNITY): Payer: Self-pay

## 2023-12-29 DIAGNOSIS — I10 Essential (primary) hypertension: Secondary | ICD-10-CM

## 2023-12-29 MED ORDER — CARVEDILOL 25 MG PO TABS
25.0000 mg | ORAL_TABLET | Freq: Two times a day (BID) | ORAL | 0 refills | Status: DC
Start: 2023-12-29 — End: 2024-02-26
  Filled 2023-12-29: qty 60, 30d supply, fill #0

## 2023-12-29 NOTE — Telephone Encounter (Signed)
 PA request has been Received. New Encounter has been or will be created for follow up. For additional info see Pharmacy Prior Auth telephone encounter from 12/29/23.

## 2023-12-29 NOTE — Telephone Encounter (Signed)
 Pharmacy Patient Advocate Encounter  Received notification from [facility] MEDICAID that Prior Authorization for Wegovy  0.5MG /0.5ML auto-injectors  has been APPROVED from 12/29/23 to 12/28/24. Ran test claim, Copay is $4. This test claim was processed through [facility]- copay amounts may vary at other pharmacies due to pharmacy/plan contracts, or as the patient moves through the different stages of their insurance plan.   PA #/Case ID/Reference #: [id number]

## 2023-12-29 NOTE — Telephone Encounter (Signed)
 Pharmacy Patient Advocate Encounter   Received notification from Pt Calls Messages that prior authorization for Wegovy  0.5MG /0.5ML auto-injectors  is required/requested.   Insurance verification completed.   The patient is insured through [facility] MEDICAID .   Per test claim: PA required; PA submitted to above mentioned insurance via CoverMyMeds Key/confirmation #/EOC [facility] Status is pending

## 2023-12-29 NOTE — Telephone Encounter (Signed)
 Copied from CRM [id number]. Topic: Clinical - Medication Prior Auth >> Dec 29, 2023  3:41 PM Yolanda T wrote:  Reason for CRM: patient called to let provider know a PA will be coming from pharmacy for her Wegovy .

## 2023-12-30 ENCOUNTER — Other Ambulatory Visit: Payer: Self-pay

## 2023-12-30 NOTE — Telephone Encounter (Signed)
 Noted

## 2024-01-03 ENCOUNTER — Other Ambulatory Visit: Payer: Self-pay

## 2024-01-04 ENCOUNTER — Other Ambulatory Visit (HOSPITAL_COMMUNITY): Payer: Self-pay

## 2024-01-04 ENCOUNTER — Other Ambulatory Visit: Payer: Self-pay

## 2024-01-11 ENCOUNTER — Other Ambulatory Visit (HOSPITAL_COMMUNITY)

## 2024-01-11 ENCOUNTER — Encounter: Payer: Self-pay | Admitting: Internal Medicine

## 2024-01-16 ENCOUNTER — Other Ambulatory Visit: Payer: Self-pay

## 2024-01-25 ENCOUNTER — Other Ambulatory Visit (HOSPITAL_COMMUNITY): Payer: Self-pay

## 2024-01-31 ENCOUNTER — Other Ambulatory Visit (HOSPITAL_COMMUNITY): Payer: Self-pay

## 2024-02-16 ENCOUNTER — Ambulatory Visit: Attending: Internal Medicine | Admitting: Internal Medicine

## 2024-02-16 ENCOUNTER — Encounter: Payer: Self-pay | Admitting: Internal Medicine

## 2024-02-16 VITALS — BP 106/70 | HR 76 | Temp 97.8°F | Ht 65.0 in | Wt 128.0 lb

## 2024-02-16 DIAGNOSIS — Z7984 Long term (current) use of oral hypoglycemic drugs: Secondary | ICD-10-CM | POA: Diagnosis not present

## 2024-02-16 DIAGNOSIS — I2510 Atherosclerotic heart disease of native coronary artery without angina pectoris: Secondary | ICD-10-CM

## 2024-02-16 DIAGNOSIS — I10 Essential (primary) hypertension: Secondary | ICD-10-CM | POA: Diagnosis not present

## 2024-02-16 DIAGNOSIS — F32A Depression, unspecified: Secondary | ICD-10-CM

## 2024-02-16 DIAGNOSIS — Z79899 Other long term (current) drug therapy: Secondary | ICD-10-CM

## 2024-02-16 DIAGNOSIS — Z23 Encounter for immunization: Secondary | ICD-10-CM | POA: Diagnosis not present

## 2024-02-16 DIAGNOSIS — Z7689 Persons encountering health services in other specified circumstances: Secondary | ICD-10-CM

## 2024-02-16 NOTE — Progress Notes (Signed)
 Patient ID: Monique Hunt, female    DOB: 03/12/1961  MRN: [id number]  CC: Hypertension (HTN f/u. /No questions / concerns/Yes to flu vax)   Subjective: Monique Hunt is a 63 y.o. female who presents for chronic ds management. Her concerns today include:  Patient with history of HTN, CAD one stent to LAD 11/2017, depression, HL, LT thyroid  nodule (no further f/u needed per endocrinology Dr. Sam), RT lung nodule (stable in size on CT 07/2020)   Discussed the use of AI scribe software for clinical note transcription with the patient, who gave verbal consent to proceed.  History of Present Illness Monique Hunt is a 63 year old female with hypertension, coronary artery disease, and hyperlipidemia who presents for follow-up of her chronic medical conditions.  Weight Management: She has experienced a slight increase in weight, noting a 5-6 pound gain on her home scale. However, her weight today is 128 pounds, which is a decrease from 130 pounds in May and 134 pounds in February. She is on Wegovy  0.5 mg and tolerates it well, attributing some weight gain to increased eating during football season.  She reports new pain at the back of her right knee for the past 2-3 days, causing a limp upon standing but improving with continued walking. She denies any recent strenuous activity, trauma, or palpable masses in the area.  HTN/CAD: she mentions a previous episode of low blood pressure but no recent dizziness. She is currently taking carvedilol  25 mg twice daily, amlodipine  5 mg daily, Cozaar  100 mg. She stopped isosorbide  30 mg stating cardiologist took her off it. She denies chest pain or shortness of breath, dizziness or LE edema.  HL: she continues to take rosuvastatin  20 mg daily and administers Repatha  injections herself.  She is on Effexor  for depression and reports doing well on it.      Patient Active Problem List   Diagnosis Date Noted   Anxiety 01/18/2022    Migraine without aura and without status migrainosus, not intractable 04/06/2021   Prediabetes 11/05/2020   Coronary artery disease involving native coronary artery of native heart with angina pectoris (HCC) 08/06/2020   Low TSH level 08/04/2018   Left thyroid  nodule 04/07/2018   Lung nodule, solitary 04/07/2018   Hyperlipidemia LDL goal <70    Old MI (myocardial infarction) 12/15/2017   Essential hypertension 12/01/2017   Depression 06/04/2012   Nephrolithiasis 09/27/2011     Current Outpatient Medications on File Prior to Visit  Medication Sig Dispense Refill   amLODipine  (NORVASC ) 10 MG tablet Take 1 tablet (10 mg total) by mouth daily. 90 tablet 1   carvedilol  (COREG ) 25 MG tablet Take 1 tablet (25 mg total) by mouth 2 (two) times daily with a meal. 60 tablet 0   Evolocumab  (REPATHA  SURECLICK) 140 MG/ML SOAJ Inject 1 pen into the skin every 14 days. 2 mL 11   losartan  (COZAAR ) 100 MG tablet Take 1 tablet (100 mg total) by mouth daily.  Please call to schedule an appt before anymore refills. [phone]. 90 tablet 3   nitroGLYCERIN  (NITROSTAT ) 0.4 MG SL tablet Place 1 tablet (0.4 mg total) under the tongue every 5 (five) minutes x 3 doses as needed for chest pain. 25 tablet 3   rosuvastatin  (CRESTOR ) 20 MG tablet Take 1 tablet by mouth daily. 90 tablet 1   Semaglutide -Weight Management (WEGOVY ) 0.5 MG/0.5ML SOAJ Inject 0.5 mg into the skin once a week. 2 mL 2   topiramate  (TOPAMAX ) 50 MG tablet  Take 0.5 tablets (25 mg total) by mouth at bedtime. 30 tablet 2   venlafaxine  XR (EFFEXOR -XR) 75 MG 24 hr capsule Take 1 capsule (75 mg total) by mouth daily with breakfast. 90 capsule 0   esomeprazole (NEXIUM) 20 MG capsule Take 20 mg by mouth daily at 12 noon. (Patient not taking: Reported on 02/16/2024)     No current facility-administered medications on file prior to visit.    Not on File  Social History   Socioeconomic History   Marital status: Divorced    Spouse name: Not on file    Number of children: 2   Years of education: Not on file   Highest education level: Associate degree: occupational, Scientist, product/process development, or vocational program  Occupational History   Not on file  Tobacco Use   Smoking status: Never   Smokeless tobacco: Never  Vaping Use   Vaping status: Never Used  Substance and Sexual Activity   Alcohol use: Yes    Comment: 3 TIMES A WEEK   Drug use: Never   Sexual activity: Yes    Comment: menopausal  Other Topics Concern   Not on file  Social History Narrative   Not on file   Social Drivers of Health   Financial Resource Strain: Low Risk  (02/16/2024)   Overall Financial Resource Strain (CARDIA)    Difficulty of Paying Living Expenses: Not hard at all  Food Insecurity: No Food Insecurity (02/16/2024)   Hunger Vital Sign    Worried About Running Out of Food in the Last Year: Never true    Ran Out of Food in the Last Year: Never true  Transportation Needs: No Transportation Needs (02/16/2024)   PRAPARE - Administrator, Civil Service (Medical): No    Lack of Transportation (Non-Medical): No  Physical Activity: Insufficiently Active (02/16/2024)   Exercise Vital Sign    Days of Exercise per Week: 2 days    Minutes of Exercise per Session: 30 min  Stress: No Stress Concern Present (02/16/2024)   [employer] of Occupational Health - Occupational Stress Questionnaire    Feeling of Stress: Not at all  Social Connections: Moderately Isolated (02/12/2024)   Social Connection and Isolation Panel    Frequency of Communication with Friends and Family: More than three times a week    Frequency of Social Gatherings with Friends and Family: Three times a week    Attends Religious Services: Never    Active Member of Clubs or Organizations: No    Attends Banker Meetings: Not on file    Marital Status: Living with partner  Intimate Partner Violence: Not At Risk (02/16/2024)   Humiliation, Afraid, Rape, and Kick questionnaire    Fear  of Current or Ex-Partner: No    Emotionally Abused: No    Physically Abused: No    Sexually Abused: No    Family History  Problem Relation Age of Onset   CAD Father    Heart disease Father    Breast cancer Mother    Hyperlipidemia Sister    Hyperlipidemia Brother    Colon cancer Neg Hx    Esophageal cancer Neg Hx    Rectal cancer Neg Hx    Stomach cancer Neg Hx     Past Surgical History:  Procedure Laterality Date   BREAST BIOPSY Right 09/27/2022   US  RT BREAST BX W LOC DEV 1ST LESION IMG BX SPEC US  GUIDE 09/27/2022 [facility] MAMMOGRAPHY   CORONARY STENT INTERVENTION N/A 12/19/2017  Procedure: CORONARY STENT INTERVENTION;  Surgeon: Claudene Victory ORN, MD;  Location[facility] INVASIVE CV LAB;  Service: Cardiovascular;  Laterality: N/A;   CYSTOSCOPY W/ STONE MANIPULATION     FACIAL FRACTURE SURGERY  1979   LEFT HEART CATH AND CORONARY ANGIOGRAPHY N/A 12/16/2017   Procedure: LEFT HEART CATH AND CORONARY ANGIOGRAPHY;  Surgeon: Claudene Victory ORN, MD;  Location: [facility] INVASIVE CV LAB;  Service: Cardiovascular;  Laterality: N/A;   LEFT HEART CATH AND CORONARY ANGIOGRAPHY N/A 12/19/2019   Procedure: LEFT HEART CATH AND CORONARY ANGIOGRAPHY;  Surgeon: Court Dorn PARAS, MD;  Location: [facility] INVASIVE CV LAB;  Service: Cardiovascular;  Laterality: N/A;   LITHOTRIPSY     SKIN SURGERY Right    birthmark removed under my arm when I was little    ROS: Review of Systems Negative except as stated above  PHYSICAL EXAM: BP 106/70 (BP Location: Left Arm, Patient Position: Sitting, Cuff Size: Normal)   Pulse 76   Temp 97.8 F (36.6 C) (Oral)   Ht 5' 5 (1.651 m)   Wt 128 lb (58.1 kg)   LMP 02/28/2011   SpO2 100%   BMI 21.30 kg/m   Wt Readings from Last 3 Encounters:  02/16/24 128 lb (58.1 kg)  10/11/23 130 lb (59 kg)  07/19/23 134 lb 6.4 oz (61 kg)    Physical Exam  General appearance - alert, well appearing, and in no distress Mental status - normal mood, behavior, speech, dress, motor activity, and  thought processes Neck - supple, no significant adenopathy Chest - clear to auscultation, no wheezes, rales or rhonchi, symmetric air entry Heart - RRR 1-2/6 SEM LUSB Extremities - peripheral pulses normal, no pedal edema, no clubbing or cyanosis      Latest Ref Rng & Units 10/11/2023    3:45 PM 08/19/2022    8:50 AM 08/25/2021    2:43 PM  CMP  Glucose 70 - 99 mg/dL 860  892  887   BUN 8 - 27 mg/dL 10  27  17    Creatinine 0.57 - 1.00 mg/dL 9.27  9.21  9.20   Sodium 134 - 144 mmol/L 140  143  137   Potassium 3.5 - 5.2 mmol/L 4.4  4.7  5.1   Chloride 96 - 106 mmol/L 103  104  103   CO2 20 - 29 mmol/L 16  23  22    Calcium  8.7 - 10.3 mg/dL 9.9  89.9  89.5   Total Protein 6.0 - 8.5 g/dL 7.1  7.4  7.9   Total Bilirubin 0.0 - 1.2 mg/dL 0.5  0.2  0.4   Alkaline Phos 44 - 121 IU/L 81  90  117   AST 0 - 40 IU/L 24  28  24    ALT 0 - 32 IU/L 25  41  29    Lipid Panel     Component Value Date/Time   CHOL 161 10/11/2023 1545   TRIG 216 (H) 10/11/2023 1545   HDL 74 10/11/2023 1545   CHOLHDL 2.2 10/11/2023 1545   CHOLHDL 7.2 12/16/2017 0714   VLDL 79 (H) 12/16/2017 0714   LDLCALC 53 10/11/2023 1545    CBC    Component Value Date/Time   WBC 4.6 10/11/2023 1545   WBC 7.7 08/18/2020 1703   RBC 4.48 10/11/2023 1545   RBC 4.85 08/18/2020 1703   HGB 14.0 10/11/2023 1545   HCT 44.1 10/11/2023 1545   PLT 361 10/11/2023 1545   MCV 98 (H) 10/11/2023 1545   MCH  31.3 10/11/2023 1545   MCH 30.3 08/18/2020 1703   MCHC 31.7 10/11/2023 1545   MCHC 33.0 08/18/2020 1703   RDW 12.8 10/11/2023 1545   LYMPHSABS 1.0 08/30/2022 1042   MONOABS 0.6 02/10/2018 1741   EOSABS 0.2 08/30/2022 1042   BASOSABS 0.1 08/30/2022 1042    ASSESSMENT AND PLAN: 1. Essential hypertension (Primary) At goal. Continue taking Cozaar  100 mg, carvedilol  25 mg twice daily, amlodipine  10 mg daily  2. Coronary artery disease involving native coronary artery of native heart without angina pectoris Stable.  Continue  carvedilol  25 mg twice a day, rosuvastatin  20 mg daily, Repatha  and ASA  3. Encounter for weight management Weight is fairly stable on Wegovy  0.5 mg once a week.  We will continue this dose.  Encourage healthy eating habits.  Encouraged regular exercise.  4. Depressive disorder in remission Stable on Effexor .  Patient was supposed to get flu shot today.  Looks like we forgot to give it.  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    Patient was given the opportunity to ask questions.  Patient verbalized understanding of the plan and was able to repeat key elements of the plan.   This documentation was completed using Paediatric nurse.  Any transcriptional errors are unintentional.  No orders of the defined types were placed in this encounter.    Requested Prescriptions    No prescriptions requested or ordered in this encounter    Return in about 4 months (around 06/17/2024).  Barnie Louder, MD, FACP

## 2024-02-16 NOTE — Patient Instructions (Signed)
 VISIT SUMMARY:  Today, we reviewed your chronic medical conditions, including hypertension, coronary artery disease, hyperlipidemia, and depression. We also addressed your recent knee pain and discussed your weight management.  YOUR PLAN:  -RIGHT POSTERIOR KNEE PAIN: You have new pain at the back of your right knee, which has been causing you to limp but improves with walking. This could be due to a muscle strain or soft tissue injury. We will examine your knee further to determine the cause.  -CORONARY ARTERY DISEASE AND ESSENTIAL HYPERTENSION: Your blood pressure is stable, and you have not experienced any recent dizziness, chest pain, or shortness of breath. You are currently taking carvedilol  and amlodipine , and you have stopped taking isosorbide  as advised by your cardiologist.  -HYPERLIPIDEMIA: Your cholesterol levels are being managed with rosuvastatin  and Repatha  injections, which you are tolerating well.  -OBESITY: Your weight has decreased from 130 lbs to 128 lbs. You are taking Wegovy  0.5 mg and tolerating it well. We discussed the importance of moderating your diet, especially high-calorie foods, and encouraged you to resume exercise once your knee pain resolves.  -DEPRESSION: Your depression is well-managed with Effexor , and you are doing well on this medication.  -GENERAL HEALTH MAINTENANCE: You are due for a flu shot, which we will administer today.  INSTRUCTIONS:  Please follow up with us  if your knee pain does not improve or if you experience any new symptoms. Continue taking your medications as prescribed. We encourage you to moderate your diet and resume exercise once your knee pain resolves. Make sure to get your flu shot today.

## 2024-02-17 ENCOUNTER — Other Ambulatory Visit (HOSPITAL_COMMUNITY)
Admission: RE | Admit: 2024-02-17 | Discharge: 2024-02-17 | Disposition: A | Discharge status: Home / Self Care | Payer: Self-pay | Source: Ambulatory Visit | Attending: Medical Genetics | Admitting: Medical Genetics

## 2024-02-17 ENCOUNTER — Encounter: Payer: Self-pay | Admitting: Internal Medicine

## 2024-02-17 NOTE — Addendum Note (Signed)
 Addended by: Valora Norell N on: 02/17/2024 06:17 PM   Modules accepted: Orders

## 2024-02-20 ENCOUNTER — Other Ambulatory Visit: Payer: Self-pay | Admitting: Internal Medicine

## 2024-02-20 DIAGNOSIS — Z7689 Persons encountering health services in other specified circumstances: Secondary | ICD-10-CM

## 2024-02-21 ENCOUNTER — Other Ambulatory Visit (HOSPITAL_COMMUNITY): Payer: Self-pay

## 2024-02-21 MED ORDER — WEGOVY 0.5 MG/0.5ML ~~LOC~~ SOAJ
0.5000 mg | SUBCUTANEOUS | 2 refills | Status: DC
  Filled 2024-02-21: qty 2, 28d supply, fill #0

## 2024-02-21 NOTE — Telephone Encounter (Signed)
 Requested Prescriptions  Pending Prescriptions Disp Refills   semaglutide -weight management (WEGOVY ) 0.5 MG/0.5ML SOAJ SQ injection 2 mL 2    Sig: Inject 0.5 mg into the skin once a week.     Endocrinology:  Diabetes - GLP-1 Receptor Agonists - semaglutide  Passed - 02/21/2024  1:58 PM      Passed - HBA1C in normal range and within 180 days    Hgb A1c MFr Bld  Date Value Ref Range Status  10/11/2023 5.4 4.8 - 5.6 % Final    Comment:             Prediabetes: 5.7 - 6.4          Diabetes: >6.4          Glycemic control for adults with diabetes: <7.0          Passed - Cr in normal range and within 360 days    Creatinine, Ser  Date Value Ref Range Status  10/11/2023 0.72 0.57 - 1.00 mg/dL Final         Passed - Valid encounter within last 6 months    Recent Outpatient Visits           5 days ago Essential hypertension   [facility] [place]. [facility] Barnie NOVAK, MD   4 months ago Essential hypertension   [facility] [place]. [facility] Barnie NOVAK, MD   6 months ago Acute cystitis without hematuria   [facility] [place]. [facility] Barnie NOVAK, MD   1 year ago Essential hypertension   [facility] [place]. [facility] Fleeta Morris, Garnette CROME, RPH-CPP   1 year ago Encounter for weight management   [facility] Of . [facility] Barnie NOVAK, MD

## 2024-02-23 ENCOUNTER — Encounter: Payer: Self-pay | Admitting: Internal Medicine

## 2024-02-25 ENCOUNTER — Other Ambulatory Visit: Payer: Self-pay

## 2024-02-25 ENCOUNTER — Encounter (HOSPITAL_BASED_OUTPATIENT_CLINIC_OR_DEPARTMENT_OTHER): Payer: Self-pay

## 2024-02-25 ENCOUNTER — Emergency Department (HOSPITAL_BASED_OUTPATIENT_CLINIC_OR_DEPARTMENT_OTHER): Admitting: Radiology

## 2024-02-25 ENCOUNTER — Emergency Department (HOSPITAL_BASED_OUTPATIENT_CLINIC_OR_DEPARTMENT_OTHER)
Admission: EM | Admit: 2024-02-25 | Discharge: 2024-02-25 | Disposition: A | Discharge status: Home / Self Care | Attending: Emergency Medicine | Admitting: Emergency Medicine

## 2024-02-25 ENCOUNTER — Emergency Department (HOSPITAL_BASED_OUTPATIENT_CLINIC_OR_DEPARTMENT_OTHER)

## 2024-02-25 DIAGNOSIS — M25561 Pain in right knee: Secondary | ICD-10-CM | POA: Diagnosis not present

## 2024-02-25 DIAGNOSIS — Z79899 Other long term (current) drug therapy: Secondary | ICD-10-CM | POA: Diagnosis not present

## 2024-02-25 DIAGNOSIS — M79651 Pain in right thigh: Secondary | ICD-10-CM | POA: Diagnosis not present

## 2024-02-25 DIAGNOSIS — M1711 Unilateral primary osteoarthritis, right knee: Secondary | ICD-10-CM | POA: Diagnosis not present

## 2024-02-25 DIAGNOSIS — M79661 Pain in right lower leg: Secondary | ICD-10-CM | POA: Diagnosis not present

## 2024-02-25 DIAGNOSIS — I10 Essential (primary) hypertension: Secondary | ICD-10-CM | POA: Insufficient documentation

## 2024-02-25 MED ORDER — ACETAMINOPHEN 500 MG PO TABS
500.0000 mg | ORAL_TABLET | Freq: Once | ORAL | Status: AC
  Administered 2024-02-25: 500 mg via ORAL
  Filled 2024-02-25: qty 1

## 2024-02-25 MED ORDER — LIDOCAINE 5 % EX PTCH
1.0000 | MEDICATED_PATCH | CUTANEOUS | Status: DC
  Administered 2024-02-25: 1 via TRANSDERMAL
  Filled 2024-02-25: qty 1

## 2024-02-25 MED ORDER — OXYCODONE-ACETAMINOPHEN 5-325 MG PO TABS
1.0000 | ORAL_TABLET | Freq: Once | ORAL | Status: AC
  Administered 2024-02-25: 1 via ORAL
  Filled 2024-02-25: qty 1

## 2024-02-25 NOTE — ED Notes (Addendum)
 Pt not in room to receive discharge paperwork. Last VS WDL.

## 2024-02-25 NOTE — ED Provider Notes (Signed)
 [facility] AT [facility] Provider Note   CSN: [id number] Arrival date & time: 02/25/24  1345     Patient presents with: Leg Pain   Monique Hunt is a 63 y.o. female with history of NSTEMI, hypertension, presents with concern for right knee pain that she noticed this morning.  Denies any injuries to the area, but does report riding a bike at the gym yesterday which she normally does not do.  Reports difficulty bearing weight on the right leg due to the pain in her knee.  Denies any numbness in the bilateral lower extremities.  Denies any pain that radiates down her leg.  Denies any skin changes to her lower extremity or swelling.  She took Advil  prior to arrival without improvement in symptoms.    Leg Pain      Prior to Admission medications   Medication Sig Start Date End Date Taking? Authorizing Provider  amLODipine  (NORVASC ) 10 MG tablet Take 1 tablet (10 mg total) by mouth daily. 11/10/23   Vicci Barnie NOVAK, MD  carvedilol  (COREG ) 25 MG tablet Take 1 tablet (25 mg total) by mouth 2 (two) times daily with a meal. 12/29/23   Vicci Barnie NOVAK, MD  esomeprazole (NEXIUM) 20 MG capsule Take 20 mg by mouth daily at 12 noon. Patient not taking: Reported on 02/16/2024    [provider]  Evolocumab  (REPATHA  SURECLICK) 140 MG/ML SOAJ Inject 1 pen into the skin every 14 days. 09/14/23   Okey Vina GAILS, MD  losartan  (COZAAR ) 100 MG tablet Take 1 tablet (100 mg total) by mouth daily.  Please call to schedule an appt before anymore refills. [phone]. 12/05/23   Vicci Barnie NOVAK, MD  nitroGLYCERIN  (NITROSTAT ) 0.4 MG SL tablet Place 1 tablet (0.4 mg total) under the tongue every 5 (five) minutes x 3 doses as needed for chest pain. 04/06/23   Okey Vina GAILS, MD  rosuvastatin  (CRESTOR ) 20 MG tablet Take 1 tablet by mouth daily. 10/03/23   Vicci Barnie NOVAK, MD  semaglutide -weight management (WEGOVY ) 0.5 MG/0.5ML SOAJ SQ injection Inject 0.5 mg into the  skin once a week. 02/21/24   Vicci Barnie NOVAK, MD  topiramate  (TOPAMAX ) 50 MG tablet Take 0.5 tablets (25 mg total) by mouth at bedtime. 11/07/23   Vicci Barnie NOVAK, MD  venlafaxine  XR (EFFEXOR -XR) 75 MG 24 hr capsule Take 1 capsule (75 mg total) by mouth daily with breakfast. 11/28/23   Vicci Barnie NOVAK, MD    Allergies: Patient has no allergy information on record.    Review of Systems  Musculoskeletal:        Right knee pain    Updated Vital Signs BP 123/82 (BP Location: Right Arm)   Pulse 90   Temp 98.2 F (36.8 C) (Oral)   Resp 19   LMP 02/28/2011   SpO2 100%   Physical Exam Vitals and nursing note reviewed.  Constitutional:      Appearance: Normal appearance.  HENT:     Head: Atraumatic.  Cardiovascular:     Rate and Rhythm: Normal rate and regular rhythm.     Comments: 2+ pedal pulses bilaterally Pulmonary:     Effort: Pulmonary effort is normal.  Musculoskeletal:     Comments: Right lower extremity:  General No obvious deformity. No erythema, contusions, open wounds.  The lower extremities appear symmetric bilaterally.  No noticeable edema of the bilateral lower extremities.  Palpation Diffusely tender to palpation over the musculature of the right anterior thigh, popliteal fossa,  and right calf.  Tender over the medial and lateral joint lines of the right knee. Non tender over the femur, tibia and fibula, patella, MCL, LCL  ROM Full knee flexion and extension, hip flexion and extension, ankle plantarflexion and dorsiflexion  Special tests No ligamentous laxity with Lachman's or posterior drawer testing, valgus or varus stress on the knee    Neurological:     General: No focal deficit present.     Mental Status: She is alert.     Comments: Sensation: Sensation intact throughout the bilateral lower extremities  Strength: 5/5 strength with resisted knee flexion and extension bilaterally 5/5 strength with resisted ankle plantarflexion and dorsiflexion  bilaterally   Psychiatric:        Mood and Affect: Mood normal.        Behavior: Behavior normal.     (all labs ordered are listed, but only abnormal results are displayed) Labs Reviewed - No data to display  EKG: None  Radiology: US  Venous Img Lower Right (DVT Study) Result Date: 02/25/2024 CLINICAL DATA:  Posterior right knee pain x2 weeks. EXAM: RIGHT LOWER EXTREMITY VENOUS DOPPLER ULTRASOUND TECHNIQUE: Gray-scale sonography with graded compression, as well as color Doppler and duplex ultrasound were performed to evaluate the lower extremity deep venous systems from the level of the common femoral vein and including the common femoral, femoral, profunda femoral, popliteal and calf veins including the posterior tibial, peroneal and gastrocnemius veins when visible. The superficial great saphenous vein was also interrogated. Spectral Doppler was utilized to evaluate flow at rest and with distal augmentation maneuvers in the common femoral, femoral and popliteal veins. COMPARISON:  None Available. FINDINGS: Contralateral Common Femoral Vein: Respiratory phasicity is normal and symmetric with the symptomatic side. No evidence of thrombus. Normal compressibility. Common Femoral Vein: No evidence of thrombus. Normal compressibility, respiratory phasicity and response to augmentation. Saphenofemoral Junction: No evidence of thrombus. Normal compressibility and flow on color Doppler imaging. Profunda Femoral Vein: No evidence of thrombus. Normal compressibility and flow on color Doppler imaging. Femoral Vein: No evidence of thrombus. Normal compressibility, respiratory phasicity and response to augmentation. Popliteal Vein: No evidence of thrombus. Normal compressibility, respiratory phasicity and response to augmentation. Calf Veins: No evidence of thrombus. Normal compressibility and flow on color Doppler imaging. Superficial Great Saphenous Vein: No evidence of thrombus. Normal compressibility. Venous  Reflux:  None. Other Findings:  None. IMPRESSION: No evidence of deep venous thrombosis within the RIGHT lower extremity. Electronically Signed   By: Suzen Dials M.D.   On: 02/25/2024 17:23   DG Knee Complete 4 Views Right Result Date: 02/25/2024 CLINICAL DATA:  Atraumatic right knee pain no history of trauma EXAM: RIGHT KNEE - COMPLETE 4+ VIEW COMPARISON:  01/11/2011 FINDINGS: Frontal, bilateral oblique, lateral views of the right knee are obtained on 4 images. No acute fracture, subluxation, or dislocation. Mild 3 compartmental osteoarthritis greatest in the medial and patellofemoral compartments. No joint effusion. Soft tissues are unremarkable. IMPRESSION: 1. Mild 3 compartmental osteoarthritis.  No acute fracture. Electronically Signed   By: Ozell Daring M.D.   On: 02/25/2024 15:49     Procedures   Medications Ordered in the ED  lidocaine  (LIDODERM ) 5 % 1 patch (1 patch Transdermal Patch Applied 02/25/24 1706)  oxyCODONE -acetaminophen  (PERCOCET/ROXICET) 5-325 MG per tablet 1 tablet (1 tablet Oral Given 02/25/24 1534)  acetaminophen  (TYLENOL ) tablet 500 mg (500 mg Oral Given 02/25/24 1706)  Medical Decision Making Amount and/or Complexity of Data Reviewed Radiology: ordered.  Risk OTC drugs. Prescription drug management.     Differential diagnosis includes but is not limited to osteoarthritis, septic arthritis, gout, fracture, dislocation, DVT, cellulitis, abscess, sprain  ED Course:  Upon initial evaluation, patient is very well-appearing, no acute distress.  Stable vitals.  Reporting pain to the right knee, reporting pain mostly in the popliteal fossa.  She is diffusely tender over the right knee including the right lower quadriceps musculature, medial and lateral joint lines of the right knee, popliteal fossa.  Does not have any bony tenderness to palpation.  No obvious deformity.  She does not have any ligamentous laxity with posterior  drawer testing, Lachman's, valgus, or varus stress of the knee.  Lower concern for sprain or ligament tear at this time.  She has full range motion of the right ankle, knee, and hip.  She has intact sensation in the bilateral lower extremities, 2+ pedal pulses bilaterally, 5/5 strength throughout the bilateral lower extremities.  No concern for a compartment syndrome at this time.  She does not have any overlying wounds, erythema, no concern for infectious etiology at this time such as a cellulitis.  No edema, erythema, no difficulty with range of motion, no concern for gout or septic arthritis.  Will proceed with x-ray of the right knee and DVT study.   Imaging Studies ordered: I ordered imaging studies including x-ray right knee, right lower extremity ultrasound I independently visualized the imaging with scope of interpretation limited to determining acute life threatening conditions related to emergency care. Imaging showed  Ultrasound right lower extremity: IMPRESSION:  No evidence of deep venous thrombosis within the RIGHT lower  extremity.   X-ray right knee: IMPRESSION:  1. Mild 3 compartmental osteoarthritis.  No acute fracture.    I agree with the radiologist interpretation   Medications Given: Percocet Tylenol  Lidocaine  patch  Upon re-evaluation, patient reports mild improvement in pain, but still has pain in the right knee.  We discussed that her x-ray did show tricompartmental osteoarthritis in the right knee.  No signs of acute fracture or dislocation.  Given she was riding a stationary bike yesterday which she normally does not do, and now has diffuse pain in the knee, suspect she has a flareup of osteoarthritis.  No signs of DVT or Baker's cyst on right lower extremity ultrasound.  No evidence of infectious etiology such as septic arthritis, cellulitis.  Does not seem consistent with gout.  She remains neurovascular intact in the bilateral lower extremities.  She is able to  ambulate, but with antalgic gait.  However, she does request crutches to help her ambulate at home.  These have been provided.  Patient stable and appropriate for discharge home.    Impression: Osteoarthritis right knee  Disposition:  The patient was discharged home with instructions to take Tylenol  and ibuprofen  as needed for pain.  Voltaren  gel topically as needed for pain.  Follow-up with orthopedics within the next week for further management.  She states she already has an orthopedic provider and will follow-up with them. Return precautions given.      This chart was dictated using voice recognition software, Dragon. Despite the best efforts of this provider to proofread and correct errors, errors may still occur which can change documentation meaning.       Final diagnoses:  Primary osteoarthritis of right knee    ED Discharge Orders     None  Veta Palma, PA-C 02/25/24 1844    Yolande Lamar BROCKS, MD 02/27/24 2023

## 2024-02-25 NOTE — Discharge Instructions (Signed)
 Your x-ray of the right knee showed arthritis which is likely the cause of your pain today.  Your ultrasound not show any blood clot in your right leg.  You may take up to 1000mg  of tylenol  every 6 hours as needed for pain.  Do not take more then 4g per day.  You may use up to 600mg  ibuprofen  every 6 hours as needed for pain.  Do not exceed 2.4g of ibuprofen  per day.  You may also apply topical Voltaren  (diclofenac ) gel to the knee to help with pain.  This is an over-the-counter medication.  Please avoid any strenuous physical activities until your pain flare has resolved.  You may ice and elevate the knee to up with pain.  Please follow-up with your orthopedic provider within the next week for reevaluation of your symptoms and further management.  Please return to the ER for any color changes in your leg, numbness, any other new or concerning symptoms

## 2024-02-25 NOTE — ED Notes (Signed)
 Ultrasound at bedside

## 2024-02-25 NOTE — ED Triage Notes (Signed)
 She c/o non-traumatic right post. Distal thigh and post. Knee pain which began this morning. She is in no distress. Her husband with harvin her.

## 2024-02-26 ENCOUNTER — Other Ambulatory Visit: Payer: Self-pay | Admitting: Internal Medicine

## 2024-02-26 DIAGNOSIS — I10 Essential (primary) hypertension: Secondary | ICD-10-CM

## 2024-02-27 ENCOUNTER — Other Ambulatory Visit: Payer: Self-pay

## 2024-02-27 ENCOUNTER — Other Ambulatory Visit (HOSPITAL_COMMUNITY): Payer: Self-pay

## 2024-02-27 MED ORDER — CARVEDILOL 25 MG PO TABS
25.0000 mg | ORAL_TABLET | Freq: Two times a day (BID) | ORAL | 1 refills | Status: AC
  Filled 2024-02-27: qty 180, 90d supply, fill #0

## 2024-02-28 LAB — GENECONNECT MOLECULAR SCREEN: Genetic Analysis Overall Interpretation: NEGATIVE

## 2024-02-29 ENCOUNTER — Other Ambulatory Visit: Payer: Self-pay

## 2024-02-29 ENCOUNTER — Other Ambulatory Visit: Payer: Self-pay | Admitting: Internal Medicine

## 2024-02-29 ENCOUNTER — Other Ambulatory Visit (HOSPITAL_COMMUNITY): Payer: Self-pay

## 2024-02-29 DIAGNOSIS — F3342 Major depressive disorder, recurrent, in full remission: Secondary | ICD-10-CM

## 2024-02-29 MED ORDER — VENLAFAXINE HCL ER 75 MG PO CP24
75.0000 mg | ORAL_CAPSULE | Freq: Every day | ORAL | 0 refills | Status: DC
  Filled 2024-02-29: qty 90, 90d supply, fill #0

## 2024-03-01 ENCOUNTER — Other Ambulatory Visit (HOSPITAL_COMMUNITY): Payer: Self-pay

## 2024-03-02 DIAGNOSIS — M25561 Pain in right knee: Secondary | ICD-10-CM | POA: Diagnosis not present

## 2024-03-23 ENCOUNTER — Other Ambulatory Visit (HOSPITAL_COMMUNITY): Payer: Self-pay

## 2024-04-16 ENCOUNTER — Other Ambulatory Visit: Payer: Self-pay

## 2024-04-16 ENCOUNTER — Other Ambulatory Visit (HOSPITAL_COMMUNITY): Payer: Self-pay

## 2024-04-16 ENCOUNTER — Other Ambulatory Visit: Payer: Self-pay | Admitting: Internal Medicine

## 2024-04-16 DIAGNOSIS — I2510 Atherosclerotic heart disease of native coronary artery without angina pectoris: Secondary | ICD-10-CM

## 2024-04-16 MED ORDER — ROSUVASTATIN CALCIUM 20 MG PO TABS
20.0000 mg | ORAL_TABLET | Freq: Every day | ORAL | 1 refills | Status: AC
  Filled 2024-04-16: qty 90, 90d supply, fill #0

## 2024-04-20 ENCOUNTER — Other Ambulatory Visit (HOSPITAL_COMMUNITY): Payer: Self-pay

## 2024-05-07 ENCOUNTER — Other Ambulatory Visit: Payer: Self-pay | Admitting: Family Medicine

## 2024-05-07 DIAGNOSIS — F3342 Major depressive disorder, recurrent, in full remission: Secondary | ICD-10-CM

## 2024-05-08 ENCOUNTER — Other Ambulatory Visit (HOSPITAL_COMMUNITY): Payer: Self-pay

## 2024-05-08 MED ORDER — VENLAFAXINE HCL ER 75 MG PO CP24
75.0000 mg | ORAL_CAPSULE | Freq: Every day | ORAL | 1 refills | Status: AC
  Filled 2024-05-08 – 2024-05-15 (×4): qty 90, 90d supply, fill #0

## 2024-05-09 ENCOUNTER — Other Ambulatory Visit (HOSPITAL_COMMUNITY): Payer: Self-pay

## 2024-05-10 ENCOUNTER — Other Ambulatory Visit (HOSPITAL_COMMUNITY): Payer: Self-pay

## 2024-05-11 ENCOUNTER — Other Ambulatory Visit: Payer: Self-pay

## 2024-05-14 ENCOUNTER — Encounter (HOSPITAL_COMMUNITY): Payer: Self-pay

## 2024-05-15 ENCOUNTER — Other Ambulatory Visit (HOSPITAL_COMMUNITY): Payer: Self-pay

## 2024-05-18 ENCOUNTER — Other Ambulatory Visit (HOSPITAL_COMMUNITY): Payer: Self-pay

## 2024-05-27 ENCOUNTER — Other Ambulatory Visit: Payer: Self-pay | Admitting: Internal Medicine

## 2024-05-27 DIAGNOSIS — I10 Essential (primary) hypertension: Secondary | ICD-10-CM

## 2024-05-27 MED ORDER — AMLODIPINE BESYLATE 10 MG PO TABS
10.0000 mg | ORAL_TABLET | Freq: Every day | ORAL | 0 refills | Status: DC
  Filled 2024-05-27: qty 90, 90d supply, fill #0

## 2024-05-28 ENCOUNTER — Other Ambulatory Visit (HOSPITAL_COMMUNITY): Payer: Self-pay

## 2024-05-28 ENCOUNTER — Other Ambulatory Visit: Payer: Self-pay

## 2024-06-05 ENCOUNTER — Encounter: Payer: Self-pay | Admitting: Internal Medicine

## 2024-06-21 ENCOUNTER — Other Ambulatory Visit: Payer: Self-pay

## 2024-06-21 ENCOUNTER — Other Ambulatory Visit (HOSPITAL_COMMUNITY): Payer: Self-pay

## 2024-06-21 ENCOUNTER — Ambulatory Visit: Attending: Internal Medicine | Admitting: Internal Medicine

## 2024-06-21 ENCOUNTER — Encounter: Payer: Self-pay | Admitting: Internal Medicine

## 2024-06-21 VITALS — BP 103/68 | HR 75 | Temp 97.9°F | Ht 65.0 in | Wt 122.0 lb

## 2024-06-21 DIAGNOSIS — I2510 Atherosclerotic heart disease of native coronary artery without angina pectoris: Secondary | ICD-10-CM

## 2024-06-21 DIAGNOSIS — F32A Depression, unspecified: Secondary | ICD-10-CM | POA: Diagnosis not present

## 2024-06-21 DIAGNOSIS — I10 Essential (primary) hypertension: Secondary | ICD-10-CM

## 2024-06-21 DIAGNOSIS — Z23 Encounter for immunization: Secondary | ICD-10-CM | POA: Diagnosis not present

## 2024-06-21 DIAGNOSIS — F419 Anxiety disorder, unspecified: Secondary | ICD-10-CM | POA: Diagnosis not present

## 2024-06-21 DIAGNOSIS — G43009 Migraine without aura, not intractable, without status migrainosus: Secondary | ICD-10-CM

## 2024-06-21 LAB — POCT GLYCOSYLATED HEMOGLOBIN (HGB A1C): HbA1c, POC (prediabetic range): 5.5 % — AB (ref 5.7–6.4)

## 2024-06-21 LAB — GLUCOSE, POCT (MANUAL RESULT ENTRY): POC Glucose: 91 mg/dL (ref 70–99)

## 2024-06-21 MED ORDER — ASPIRIN 81 MG PO TBEC: 81.0000 mg | DELAYED_RELEASE_TABLET | Freq: Every day | ORAL | Status: AC

## 2024-06-21 MED ORDER — KETOROLAC TROMETHAMINE 60 MG/2ML IM SOLN: 60.0000 mg | Freq: Once | INTRAMUSCULAR | Status: DC

## 2024-06-21 MED ORDER — AMLODIPINE BESYLATE 10 MG PO TABS
10.0000 mg | ORAL_TABLET | Freq: Every day | ORAL | 0 refills | Status: AC
  Filled 2024-06-21: qty 90, 90d supply, fill #0

## 2024-06-21 MED ORDER — ONDANSETRON 4 MG PO TBDP
4.0000 mg | ORAL_TABLET | Freq: Two times a day (BID) | ORAL | 0 refills | Status: AC | PRN
  Filled 2024-06-21 – 2024-06-22 (×3): qty 20, 10d supply, fill #0

## 2024-06-21 MED ORDER — BUSPIRONE HCL 5 MG PO TABS
5.0000 mg | ORAL_TABLET | Freq: Two times a day (BID) | ORAL | 3 refills | Status: AC
  Filled 2024-06-21 – 2024-06-22 (×3): qty 60, 30d supply, fill #0

## 2024-06-21 MED ORDER — UBRELVY 50 MG PO TABS
ORAL_TABLET | ORAL | 2 refills | Status: AC
  Filled 2024-06-21 – 2024-07-03 (×2): qty 10, 30d supply, fill #0
  Filled 2024-07-04: qty 10, fill #0

## 2024-06-21 MED ORDER — KETOROLAC TROMETHAMINE 30 MG/ML IJ SOLN
30.0000 mg | Freq: Once | INTRAMUSCULAR | Status: AC
  Administered 2024-06-21: 30 mg via INTRAMUSCULAR

## 2024-06-21 NOTE — Progress Notes (Signed)
 "   Patient ID: Monique Hunt, female    DOB: Oct 20, 1960  MRN: [id number]  CC: Hypertension (HTN & pre-diabetes f/u/Headaches, nausea X2 days/Already received flu vax)   Subjective: Monique Hunt is a 64 y.o. female who presents for chronic ds management. Her chronic medical issues include:  Patient with history of HTN, CAD one stent to LAD 11/2017, depression, HL, LT thyroid  nodule (no further f/u needed per endocrinology Dr. Sam), RT lung nodule (stable in size on CT 07/2020)   Discussed the use of AI scribe software for clinical note transcription with the patient, who gave verbal consent to proceed.  History of Present Illness [name] is a 64 year old female with migraines and heart disease who presents for chronic disease management and acute headache.  She is experiencing a severe headache that began this morning behind her eyes and has since spread to the back of her head. The headache is described as 'horrible' and is accompanied by nausea and photophobia. She took Tylenol  with minimal relief and feels nauseated to the point of emesis. She has a history of migraines but has not experienced one in about a year until today. HA today is similar to migraines in past. She continues to take Topiramate  50 mg nightly for migraine prevention.  HTN/CAD: She is currently taking Cozaar  100 mg daily, Carvedilol  25 mg twice daily, Rosuvastatin  20 mg daily, Repatha  injections twice a month, and aspirin  81 mg daily. No chest pain or dyspnea.  She is being treated for depression with Effexor  and reports doing well on it, although she has been feeling more irritable and anxious lately. She attributes some of this to her living situation with her ex-husband, who is diabetic and other ailments and she is his caregiver. He is a lot older than she is.  She worries about his health.  Lately he has been more short tempered with her. She describes feeling 'ill' and notes that her  ex-husband's behavior has been getting on her nerves, which is unusual for her.  She is requesting something to help calm her nerves.      Patient Active Problem List   Diagnosis Date Noted   Anxiety 01/18/2022   Migraine without aura and without status migrainosus, not intractable 04/06/2021   Prediabetes 11/05/2020   Coronary artery disease involving native coronary artery of native heart with angina pectoris 08/06/2020   Low TSH level 08/04/2018   Left thyroid  nodule 04/07/2018   Lung nodule, solitary 04/07/2018   Hyperlipidemia LDL goal <70    Old MI (myocardial infarction) 12/15/2017   Essential hypertension 12/01/2017   Depression 06/04/2012   Nephrolithiasis 09/27/2011     Medications Ordered Prior to Encounter[1]  Allergies[2]  Social History   Socioeconomic History   Marital status: Divorced    Spouse name: Not on file   Number of children: 2   Years of education: Not on file   Highest education level: Associate degree: occupational, scientist, product/process development, or vocational program  Occupational History   Not on file  Tobacco Use   Smoking status: Never   Smokeless tobacco: Never  Vaping Use   Vaping status: Never Used  Substance and Sexual Activity   Alcohol use: Yes    Comment: 3 TIMES A WEEK   Drug use: Never   Sexual activity: Yes    Comment: menopausal  Other Topics Concern   Not on file  Social History Narrative   Not on file   Social Drivers of Health  Tobacco Use: Low Risk (02/25/2024)   Patient History    Smoking Tobacco Use: Never    Smokeless Tobacco Use: Never    Passive Exposure: Not on file  Financial Resource Strain: Low Risk (06/20/2024)   Overall Financial Resource Strain (CARDIA)    Difficulty of Paying Living Expenses: Not hard at all  Food Insecurity: No Food Insecurity (06/20/2024)   Epic    Worried About Programme Researcher, Broadcasting/film/video in the Last Year: Never true    Ran Out of Food in the Last Year: Never true  Transportation Needs: No  Transportation Needs (06/20/2024)   Epic    Lack of Transportation (Medical): No    Lack of Transportation (Non-Medical): No  Physical Activity: Insufficiently Active (06/20/2024)   Exercise Vital Sign    Days of Exercise per Week: 4 days    Minutes of Exercise per Session: 20 min  Stress: Patient Declined (06/20/2024)   [employer] of Occupational Health - Occupational Stress Questionnaire    Feeling of Stress: Patient declined  Social Connections: Moderately Isolated (06/20/2024)   Social Connection and Isolation Panel    Frequency of Communication with Friends and Family: More than three times a week    Frequency of Social Gatherings with Friends and Family: Once a week    Attends Religious Services: Never    Database Administrator or Organizations: No    Attends Engineer, Structural: Not on file    Marital Status: Living with partner  Intimate Partner Violence: Not At Risk (02/16/2024)   Epic    Fear of Current or Ex-Partner: No    Emotionally Abused: No    Physically Abused: No    Sexually Abused: No  Depression (PHQ2-9): Low Risk (06/21/2024)   Depression (PHQ2-9)    PHQ-2 Score: 1  Alcohol Screen: Low Risk (02/16/2024)   Alcohol Screen    Last Alcohol Screening Score (AUDIT): 2  Housing: Low Risk (06/20/2024)   Epic    Unable to Pay for Housing in the Last Year: No    Number of Times Moved in the Last Year: 0    Homeless in the Last Year: No  Utilities: Not At Risk (02/16/2024)   Epic    Threatened with loss of utilities: No  Health Literacy: Adequate Health Literacy (02/16/2024)   B1300 Health Literacy    Frequency of need for help with medical instructions: Rarely    Family History  Problem Relation Age of Onset   CAD Father    Heart disease Father    Breast cancer Mother    Hyperlipidemia Sister    Hyperlipidemia Brother    Colon cancer Neg Hx    Esophageal cancer Neg Hx    Rectal cancer Neg Hx    Stomach cancer Neg Hx     Past Surgical History:   Procedure Laterality Date   BREAST BIOPSY Right 09/27/2022   US  RT BREAST BX W LOC DEV 1ST LESION IMG BX SPEC US  GUIDE 09/27/2022 [facility]   CORONARY STENT INTERVENTION N/A 12/19/2017   Procedure: CORONARY STENT INTERVENTION;  Surgeon: Claudene Victory ORN, MD;  Location: [facility];  Service: Cardiovascular;  Laterality: N/A;   CYSTOSCOPY W/ STONE MANIPULATION     FACIAL FRACTURE SURGERY  1979   LEFT HEART CATH AND CORONARY ANGIOGRAPHY N/A 12/16/2017   Procedure: LEFT HEART CATH AND CORONARY ANGIOGRAPHY;  Surgeon: Claudene Victory ORN, MD;  Location: [facility];  Service: Cardiovascular;  Laterality: N/A;  LEFT HEART CATH AND CORONARY ANGIOGRAPHY N/A 12/19/2019   Procedure: LEFT HEART CATH AND CORONARY ANGIOGRAPHY;  Surgeon: Court Dorn PARAS, MD;  Location: [facility];  Service: Cardiovascular;  Laterality: N/A;   LITHOTRIPSY     SKIN SURGERY Right    birthmark removed under my arm when I was little    ROS: Review of Systems Negative except as stated above  PHYSICAL EXAM: BP 103/68 (BP Location: Left Arm, Patient Position: Sitting, Cuff Size: Normal)   Pulse 75   Temp 97.9 F (36.6 C) (Oral)   Ht 5' 5 (1.651 m)   Wt 122 lb (55.3 kg)   LMP 02/28/2011   SpO2 100%   BMI 20.30 kg/m   Wt Readings from Last 3 Encounters:  06/21/24 122 lb (55.3 kg)  02/16/24 128 lb (58.1 kg)  10/11/23 130 lb (59 kg)    Physical Exam  General appearance - alert, well appearing, and in no distress Mental status - normal mood, behavior, speech, dress, motor activity, and thought processes Neck - supple, no significant adenopathy Chest - clear to auscultation, no wheezes, rales or rhonchi, symmetric air entry Heart - normal rate, regular rhythm, normal S1, S2, no murmurs, rubs, clicks or gallops Neurological - cranial nerves II through XII intact, motor and sensory grossly normal bilaterally Extremities - peripheral pulses normal, no pedal edema, no clubbing or  cyanosis     06/21/2024    4:19 PM 02/16/2024    2:36 PM 10/11/2023    2:47 PM  Depression screen PHQ 2/9  Decreased Interest 0 0 0  Down, Depressed, Hopeless 0 0 0  PHQ - 2 Score 0 0 0  Altered sleeping 1 0   Tired, decreased energy 0 0   Change in appetite 0 0   Feeling bad or failure about yourself  0 0   Trouble concentrating 0 0   Moving slowly or fidgety/restless 0 0   Suicidal thoughts 0 0   PHQ-9 Score 1 0    Difficult doing work/chores Not difficult at all Not difficult at all      Data saved with a previous flowsheet row definition      06/21/2024    4:20 PM 02/16/2024    2:36 PM 10/11/2023    2:47 PM 12/10/2022    9:12 AM  GAD 7 : Generalized Anxiety Score  Nervous, Anxious, on Edge 1 0  0  0   Control/stop worrying 0 0  0  0   Worry too much - different things 0 0  0  0   Trouble relaxing 0 0  0  0   Restless 0 0  0  0   Easily annoyed or irritable 1 0  0  0   Afraid - awful might happen 0 0  0  0   Total GAD 7 Score 2 0 0 0  Anxiety Difficulty Not difficult at all Not difficult at all       Data saved with a previous flowsheet row definition        Latest Ref Rng & Units 10/11/2023    3:45 PM 08/19/2022    8:50 AM 08/25/2021    2:43 PM  CMP  Glucose 70 - 99 mg/dL 860  892  887   BUN 8 - 27 mg/dL 10  27  17    Creatinine 0.57 - 1.00 mg/dL 9.27  9.21  9.20   Sodium 134 - 144 mmol/L 140  143  137  Potassium 3.5 - 5.2 mmol/L 4.4  4.7  5.1   Chloride 96 - 106 mmol/L 103  104  103   CO2 20 - 29 mmol/L 16  23  22    Calcium  8.7 - 10.3 mg/dL 9.9  89.9  89.5   Total Protein 6.0 - 8.5 g/dL 7.1  7.4  7.9   Total Bilirubin 0.0 - 1.2 mg/dL 0.5  0.2  0.4   Alkaline Phos 44 - 121 IU/L 81  90  117   AST 0 - 40 IU/L 24  28  24    ALT 0 - 32 IU/L 25  41  29    Lipid Panel     Component Value Date/Time   CHOL 161 10/11/2023 1545   TRIG 216 (H) 10/11/2023 1545   HDL 74 10/11/2023 1545   CHOLHDL 2.2 10/11/2023 1545   CHOLHDL 7.2 12/16/2017 0714   VLDL 79 (H)  12/16/2017 0714   LDLCALC 53 10/11/2023 1545    CBC    Component Value Date/Time   WBC 4.6 10/11/2023 1545   WBC 7.7 08/18/2020 1703   RBC 4.48 10/11/2023 1545   RBC 4.85 08/18/2020 1703   HGB 14.0 10/11/2023 1545   HCT 44.1 10/11/2023 1545   PLT 361 10/11/2023 1545   MCV 98 (H) 10/11/2023 1545   MCH 31.3 10/11/2023 1545   MCH 30.3 08/18/2020 1703   MCHC 31.7 10/11/2023 1545   MCHC 33.0 08/18/2020 1703   RDW 12.8 10/11/2023 1545   LYMPHSABS 1.0 08/30/2022 1042   MONOABS 0.6 02/10/2018 1741   EOSABS 0.2 08/30/2022 1042   BASOSABS 0.1 08/30/2022 1042    ASSESSMENT AND PLAN: 1. Migraine without aura and without status migrainosus, not intractable (Primary) Will try her with Ubrelvy  for abortive therapy.  We will need to get prior approval on this.  I went over with her how to use the medication.  I also prescribed some Zofran  for her to take as needed for the nausea.  Toradol  shot given today. - ondansetron  (ZOFRAN -ODT) 4 MG disintegrating tablet; Dissolve 1 tablet (4 mg total) by mouth 2 (two) times daily as needed for nausea or vomiting.  Dispense: 20 tablet; Refill: 0 - Ubrogepant  (UBRELVY ) 50 MG TABS; Take 1 tablet  by mouthat start of headache.  May repeat in 2 hrs x 1 if no relief. Max 2 tabs/24 hrs  Dispense: 10 tablet; Refill: 2 - ketorolac  (TORADOL ) 30 MG/ML injection 30 mg  2. Essential hypertension At goal.  Continue Cozaar  100 mg daily, carvedilol  25 mg twice a day and amlodipine  10 mg daily. - amLODipine  (NORVASC ) 10 MG tablet; Take 1 tablet (10 mg total) by mouth daily.  Dispense: 90 tablet; Refill: 0  3. Need for influenza vaccination - POCT glucose (manual entry) - POCT glycosylated hemoglobin (Hb A1C)  4. Depressive disorder in remission Doing well on Effexor  which we will continue.  5. Coronary artery disease involving native coronary artery of native heart without angina pectoris Stable.  Continue carvedilol  25 mg twice a day, Repatha  injection every 2  weeks, Crestor  20 mg daily and aspirin  81 mg daily - aspirin  EC 81 MG tablet; Take 1 tablet (81 mg total) by mouth daily. Swallow whole.  6. Anxiety Encouraged her to have a conversation with her ex-husband.  We discussed increasing the Effexor  versus adding BuSpar .  Patient prefers to try BuSpar . - busPIRone  (BUSPAR ) 5 MG tablet; Take 1 tablet (5 mg total) by mouth 2 (two) times daily.  Dispense: 60 tablet;  Refill: 3    Patient was given the opportunity to ask questions.  Patient verbalized understanding of the plan and was able to repeat key elements of the plan.   This documentation was completed using Paediatric nurse.  Any transcriptional errors are unintentional.  Orders Placed This Encounter  Procedures   POCT glucose (manual entry)   POCT glycosylated hemoglobin (Hb A1C)     Requested Prescriptions   Signed Prescriptions Disp Refills   amLODipine  (NORVASC ) 10 MG tablet 90 tablet 0    Sig: Take 1 tablet (10 mg total) by mouth daily.   ondansetron  (ZOFRAN -ODT) 4 MG disintegrating tablet 20 tablet 0    Sig: Dissolve 1 tablet (4 mg total) by mouth 2 (two) times daily as needed for nausea or vomiting.   Ubrogepant  (UBRELVY ) 50 MG TABS 10 tablet 2    Sig: Take 1 tablet  by mouthat start of headache.  May repeat in 2 hrs x 1 if no relief. Max 2 tabs/24 hrs   aspirin  EC 81 MG tablet      Sig: Take 1 tablet (81 mg total) by mouth daily. Swallow whole.   busPIRone  (BUSPAR ) 5 MG tablet 60 tablet 3    Sig: Take 1 tablet (5 mg total) by mouth 2 (two) times daily.    Return in about 1 month (around 07/22/2024).  Barnie Louder, MD, FACP     [1]  Current Outpatient Medications on File Prior to Visit  Medication Sig Dispense Refill   carvedilol  (COREG ) 25 MG tablet Take 1 tablet (25 mg total) by mouth 2 (two) times daily with a meal. 180 tablet 1   Evolocumab  (REPATHA  SURECLICK) 140 MG/ML SOAJ Inject 1 pen into the skin every 14 days. 2 mL 11   losartan   (COZAAR ) 100 MG tablet Take 1 tablet (100 mg total) by mouth daily.  Please call to schedule an appt before anymore refills. [phone]. 90 tablet 3   nitroGLYCERIN  (NITROSTAT ) 0.4 MG SL tablet Place 1 tablet (0.4 mg total) under the tongue every 5 (five) minutes x 3 doses as needed for chest pain. 25 tablet 3   rosuvastatin  (CRESTOR ) 20 MG tablet Take 1 tablet by mouth daily. 90 tablet 1   topiramate  (TOPAMAX ) 50 MG tablet Take 0.5 tablets (25 mg total) by mouth at bedtime. 30 tablet 2   venlafaxine  XR (EFFEXOR -XR) 75 MG 24 hr capsule Take 1 capsule (75 mg total) by mouth daily with breakfast. 90 capsule 1   No current facility-administered medications on file prior to visit.  [2] Not on File  "

## 2024-06-21 NOTE — Patient Instructions (Signed)
" °  VISIT SUMMARY: Today, you were seen for chronic disease management and an acute headache. You reported a severe headache with nausea and sensitivity to light, which began this morning. We also reviewed your heart disease and depression management.  YOUR PLAN: -MIGRAINE WITHOUT AURA: A migraine is a type of headache that can cause severe pain, nausea, and sensitivity to light. You were prescribed Ubrelvy  for acute migraine relief, to be taken at the onset of a migraine, with a second dose after two hours if needed, but no more than two doses in 24 hours. Zofran  was prescribed for nausea, and you received a Toradol  injection for immediate relief. We have also initiated prior approval for Ubrelvy .  -ESSENTIAL HYPERTENSION: Essential hypertension is high blood pressure with no identifiable cause. Your blood pressure is well-controlled with your current medications, so you should continue taking Cozaar  100 mg daily and Carvedilol  25 mg twice daily.  -ATHEROSCLEROTIC HEART DISEASE: Atherosclerotic heart disease is a condition where the arteries become narrowed or blocked due to plaque buildup. Your condition is managed with your current medications, so you should continue taking Rosuvastatin  20 mg daily, Repatha  injections twice a month, and aspirin  81 mg daily.  -MAJOR DEPRESSIVE DISORDER AND ANXIETY: Major depressive disorder is a mental health condition characterized by persistent feelings of sadness and loss of interest. Anxiety is a feeling of worry or fear. Your depression is well-managed with Effexor , and you were prescribed Buspar  5 mg twice daily for anxiety, with the option to take it as needed. Continue taking Effexor  as prescribed.  INSTRUCTIONS: Please follow up with us  if your symptoms worsen or if you have any concerns about your medications. Make sure to take your medications as prescribed and monitor your symptoms. If you experience any new or unusual symptoms, contact our office  immediately.    Contains text generated by [employer].   "

## 2024-06-22 ENCOUNTER — Other Ambulatory Visit (HOSPITAL_COMMUNITY): Payer: Self-pay

## 2024-06-22 ENCOUNTER — Other Ambulatory Visit: Payer: Self-pay

## 2024-06-22 ENCOUNTER — Encounter: Payer: Self-pay | Admitting: Pharmacist

## 2024-06-25 ENCOUNTER — Other Ambulatory Visit: Payer: Self-pay

## 2024-06-26 ENCOUNTER — Other Ambulatory Visit: Payer: Self-pay

## 2024-06-28 ENCOUNTER — Other Ambulatory Visit: Payer: Self-pay

## 2024-07-03 ENCOUNTER — Other Ambulatory Visit: Payer: Self-pay

## 2024-07-03 ENCOUNTER — Other Ambulatory Visit (HOSPITAL_COMMUNITY): Payer: Self-pay

## 2024-07-03 ENCOUNTER — Encounter: Payer: Self-pay | Admitting: Internal Medicine

## 2024-07-04 ENCOUNTER — Other Ambulatory Visit: Payer: Self-pay

## 2024-07-05 ENCOUNTER — Other Ambulatory Visit: Payer: Self-pay

## 2024-07-24 ENCOUNTER — Ambulatory Visit: Payer: Self-pay | Admitting: Internal Medicine
